# Patient Record
Sex: Male | Born: 1955 | Race: White | Hispanic: No | Marital: Married | Smoking: Never smoker
Health system: Southern US, Community
[De-identification: ages and names within clinical notes are randomized; demographics above are authoritative.]

## PROBLEM LIST (undated history)

## (undated) DIAGNOSIS — M1990 Unspecified osteoarthritis, unspecified site: Secondary | ICD-10-CM

## (undated) DIAGNOSIS — D649 Anemia, unspecified: Secondary | ICD-10-CM

## (undated) DIAGNOSIS — M109 Gout, unspecified: Secondary | ICD-10-CM

## (undated) DIAGNOSIS — E785 Hyperlipidemia, unspecified: Secondary | ICD-10-CM

## (undated) DIAGNOSIS — Z87442 Personal history of urinary calculi: Secondary | ICD-10-CM

## (undated) DIAGNOSIS — E119 Type 2 diabetes mellitus without complications: Secondary | ICD-10-CM

## (undated) DIAGNOSIS — I10 Essential (primary) hypertension: Secondary | ICD-10-CM

## (undated) DIAGNOSIS — K219 Gastro-esophageal reflux disease without esophagitis: Secondary | ICD-10-CM

## (undated) HISTORY — DX: Gout, unspecified: M10.9

## (undated) HISTORY — PX: COLONOSCOPY: SHX174

## (undated) HISTORY — PX: JOINT REPLACEMENT: SHX530

## (undated) HISTORY — PX: LEG SURGERY: SHX1003

## (undated) HISTORY — DX: Hyperlipidemia, unspecified: E78.5

## (undated) HISTORY — PX: NO PAST SURGERIES: SHX2092

---

## 2001-02-21 ENCOUNTER — Other Ambulatory Visit: Admission: RE | Admit: 2001-02-21 | Discharge: 2001-02-21 | Payer: Self-pay | Admitting: Gastroenterology

## 2006-02-10 ENCOUNTER — Ambulatory Visit: Payer: Self-pay | Admitting: Gastroenterology

## 2006-02-27 ENCOUNTER — Ambulatory Visit: Payer: Self-pay | Admitting: Gastroenterology

## 2013-09-16 ENCOUNTER — Other Ambulatory Visit: Payer: Self-pay

## 2013-09-16 ENCOUNTER — Telehealth: Payer: Self-pay

## 2013-09-16 DIAGNOSIS — Z1211 Encounter for screening for malignant neoplasm of colon: Secondary | ICD-10-CM

## 2013-09-16 NOTE — Telephone Encounter (Signed)
Gastroenterology Pre-Procedure Review  Request Date: 09/16/2013 Requesting Physician: Collene Mares  [place], PA-C   PT SAID HE HAD PREVIOUS COLONOSCOPY DONE AT [place]/ MOM HAD COLON CANCER AT AGE 55 AND HE THINKS HE MIGHT HAVE HAD POLYPS ONCE  HE WAS SUPPOSED TO HAVE COLONOSCOPY EVERY 5 YEARS AND HE SAID HE IS OVERDUE  PATIENT REVIEW QUESTIONS: The patient responded to the following health history questions as indicated:    1. Diabetes Melitis: YES 2. Joint replacements in the past 12 months: no 3. Major health problems in the past 3 months: no 4. Has an artificial valve or MVP: no 5. Has a defibrillator: no 6. Has been advised in past to take antibiotics in advance of a procedure like teeth cleaning: no    MEDICATIONS & ALLERGIES:    Patient reports the following regarding taking any blood thinners:   Plavix? no Aspirin? YES Coumadin? no  Patient confirms/reports the following medications:  Current Outpatient Prescriptions  Medication Sig Dispense Refill  . allopurinol (ZYLOPRIM) 300 MG tablet Take 300 mg by mouth daily.      Marland Kitchen aspirin 81 MG tablet Take 81 mg by mouth daily.      Marland Kitchen atenolol (TENORMIN) 100 MG tablet Take 100 mg by mouth 2 (two) times daily.      . ferrous sulfate 325 (65 FE) MG tablet Take 325 mg by mouth 2 (two) times daily with a meal.      . glipiZIDE-metformin (METAGLIP) 5-500 MG per tablet Take 2 tablets by mouth 2 (two) times daily before a meal.      . lisinopril (PRINIVIL,ZESTRIL) 20 MG tablet Take 20 mg by mouth daily.      . meloxicam (MOBIC) 15 MG tablet Take 15 mg by mouth daily.      . Multiple Vitamin (MULTIVITAMIN) tablet Take 1 tablet by mouth daily.      . niacin (NIASPAN) 1000 MG CR tablet Take 1,000 mg by mouth at bedtime.      . Omega-3 Fatty Acids (FISH OIL) 1000 MG CAPS Take 1,000 mg by mouth. Takes 2 tablets twice a day      . omeprazole (PRILOSEC) 20 MG capsule Take 20 mg by mouth daily.      . pravastatin (PRAVACHOL) 40 MG tablet Take 40 mg  by mouth daily.       No current facility-administered medications for this visit.    Patient confirms/reports the following allergies:  No Known Allergies  No orders of the defined types were placed in this encounter.    AUTHORIZATION INFORMATION Primary Insurance:   ID #:   Group #:  Pre-Cert / Auth required: Pre-Cert / Auth #:   Secondary Insurance:   ID #:   Group #:  Pre-Cert / Auth required: Pre-Cert / Auth #:   SCHEDULE INFORMATION: Procedure has been scheduled as follows:  Date: 10/07/2013                  Time:8:30 AM   Location: [facility] Short Stay  This Gastroenterology Pre-Precedure Review Form is being routed to the following provider(s): R. Garfield Cornea, MD

## 2013-09-17 NOTE — Telephone Encounter (Signed)
I called and left Vm for pt to find out when last colonoscopy was and to let me know right away.

## 2013-09-17 NOTE — Telephone Encounter (Signed)
When was his last colonoscopy exactly? We need to find out before scheduling.

## 2013-09-19 NOTE — Telephone Encounter (Signed)
Pt came by yesterday afternoon and signed release for [place] to get previous colonoscopy report from [place].

## 2013-09-25 ENCOUNTER — Encounter (HOSPITAL_COMMUNITY): Payer: Self-pay | Admitting: Pharmacy Technician

## 2013-09-25 NOTE — Telephone Encounter (Signed)
Bryan Atkinson, the reports are here from [place]. Dr. Lucio Edward did his last colonoscopy in 02/2006 and next was recommended for 02/2011.  His mother had colon cancer at 55 years old.  ( See report on your desk).

## 2013-09-25 NOTE — Telephone Encounter (Signed)
Per Laban Emperor, NP  Ok to proceed with the colonoscopy.

## 2013-09-26 NOTE — Telephone Encounter (Signed)
Bryan Atkinson, any diabetic meds change for day of prep

## 2013-09-30 MED ORDER — PEG-KCL-NACL-NASULF-NA ASC-C 100 G PO SOLR: 1.0000 | ORAL | Status: DC

## 2013-09-30 NOTE — Telephone Encounter (Signed)
Rx sent to pharmacy and instructions mailed to pt.

## 2013-09-30 NOTE — Telephone Encounter (Signed)
No diabetes medication day of procedure.

## 2013-10-07 ENCOUNTER — Encounter (HOSPITAL_COMMUNITY)
Admission: RE | Disposition: A | Discharge status: Home / Self Care | Payer: Self-pay | Source: Ambulatory Visit | Attending: Internal Medicine

## 2013-10-07 ENCOUNTER — Ambulatory Visit (HOSPITAL_COMMUNITY)
Admission: RE | Admit: 2013-10-07 | Discharge: 2013-10-07 | Disposition: A | Discharge status: Home / Self Care | Payer: Managed Care, Other (non HMO) | Source: Ambulatory Visit | Attending: Internal Medicine | Admitting: Internal Medicine

## 2013-10-07 ENCOUNTER — Encounter (HOSPITAL_COMMUNITY): Payer: Self-pay

## 2013-10-07 DIAGNOSIS — Z1211 Encounter for screening for malignant neoplasm of colon: Secondary | ICD-10-CM | POA: Insufficient documentation

## 2013-10-07 DIAGNOSIS — E119 Type 2 diabetes mellitus without complications: Secondary | ICD-10-CM | POA: Insufficient documentation

## 2013-10-07 DIAGNOSIS — Z79899 Other long term (current) drug therapy: Secondary | ICD-10-CM | POA: Insufficient documentation

## 2013-10-07 DIAGNOSIS — Z7982 Long term (current) use of aspirin: Secondary | ICD-10-CM | POA: Insufficient documentation

## 2013-10-07 DIAGNOSIS — I10 Essential (primary) hypertension: Secondary | ICD-10-CM | POA: Insufficient documentation

## 2013-10-07 DIAGNOSIS — Z800 Family history of malignant neoplasm of digestive organs: Secondary | ICD-10-CM

## 2013-10-07 DIAGNOSIS — Z01812 Encounter for preprocedural laboratory examination: Secondary | ICD-10-CM | POA: Insufficient documentation

## 2013-10-07 HISTORY — PX: COLONOSCOPY: SHX5424

## 2013-10-07 HISTORY — DX: Type 2 diabetes mellitus without complications: E11.9

## 2013-10-07 HISTORY — DX: Essential (primary) hypertension: I10

## 2013-10-07 HISTORY — DX: Unspecified osteoarthritis, unspecified site: M19.90

## 2013-10-07 LAB — GLUCOSE, CAPILLARY: Glucose-Capillary: 112 mg/dL — ABNORMAL HIGH (ref 70–99)

## 2013-10-07 SURGERY — COLONOSCOPY
Anesthesia: Moderate Sedation

## 2013-10-07 MED ORDER — MEPERIDINE HCL 100 MG/ML IJ SOLN
INTRAMUSCULAR | Status: AC
  Filled 2013-10-07: qty 2

## 2013-10-07 MED ORDER — SODIUM CHLORIDE 0.9 % IV SOLN
INTRAVENOUS | Status: DC
Start: 2013-10-07 — End: 2013-10-07
  Administered 2013-10-07: 08:00:00 via INTRAVENOUS

## 2013-10-07 MED ORDER — MIDAZOLAM HCL 5 MG/5ML IJ SOLN
INTRAMUSCULAR | Status: AC
  Filled 2013-10-07: qty 10

## 2013-10-07 MED ORDER — ONDANSETRON HCL 4 MG/2ML IJ SOLN
INTRAMUSCULAR | Status: DC | PRN
  Administered 2013-10-07: 4 mg via INTRAVENOUS

## 2013-10-07 MED ORDER — STERILE WATER FOR IRRIGATION IR SOLN
Status: DC | PRN
  Administered 2013-10-07: 08:00:00

## 2013-10-07 MED ORDER — MIDAZOLAM HCL 5 MG/5ML IJ SOLN
INTRAMUSCULAR | Status: DC | PRN
  Administered 2013-10-07 (×2): 2 mg via INTRAVENOUS
  Administered 2013-10-07: 1 mg via INTRAVENOUS

## 2013-10-07 MED ORDER — ONDANSETRON HCL 4 MG/2ML IJ SOLN
INTRAMUSCULAR | Status: AC
  Filled 2013-10-07: qty 2

## 2013-10-07 MED ORDER — MEPERIDINE HCL 100 MG/ML IJ SOLN
INTRAMUSCULAR | Status: DC | PRN
  Administered 2013-10-07 (×2): 50 mg via INTRAVENOUS

## 2013-10-07 NOTE — H&P (Signed)
Primary Care Physician:  Imelda Pillow, NP Primary Gastroenterologist:  Dr. Gala Romney  Pre-Procedure History & Physical: HPI:  Bryan Atkinson is a 57 y.o. male is here for a screening colonoscopy.  May have had polyps at last colonoscopy 2007-Dr. Marliss Czar. Mom succumbed colorectal cancer and a 40. No bowel symptoms currently.  Past Medical History  Diagnosis Date  . Hypertension   . Diabetes mellitus without complication   . Arthritis     Past Surgical History  Procedure Laterality Date  . No past surgeries    . Leg surgery Right early 80's  . Colonoscopy      times two 2002 ,2007    Prior to Admission medications   Medication Sig Start Date End Date Taking? Authorizing Provider  allopurinol (ZYLOPRIM) 300 MG tablet Take 300 mg by mouth daily.   Yes Historical Provider, MD  aspirin 81 MG tablet Take 81 mg by mouth daily.   Yes Historical Provider, MD  atenolol (TENORMIN) 100 MG tablet Take 100 mg by mouth 2 (two) times daily.   Yes Historical Provider, MD  ferrous sulfate 325 (65 FE) MG tablet Take 325 mg by mouth 2 (two) times daily with a meal.   Yes Historical Provider, MD  glipiZIDE-metformin (METAGLIP) 5-500 MG per tablet Take 2 tablets by mouth 2 (two) times daily before a meal.   Yes Historical Provider, MD  lisinopril (PRINIVIL,ZESTRIL) 20 MG tablet Take 20 mg by mouth daily.   Yes Historical Provider, MD  meloxicam (MOBIC) 15 MG tablet Take 15 mg by mouth daily.   Yes Historical Provider, MD  Multiple Vitamin (MULTIVITAMIN) tablet Take 1 tablet by mouth daily.   Yes Historical Provider, MD  Omega-3 Fatty Acids (FISH OIL) 1000 MG CAPS Take 2,000 mg by mouth 2 (two) times daily.    Yes Historical Provider, MD  omeprazole (PRILOSEC) 20 MG capsule Take 20 mg by mouth daily.   Yes Historical Provider, MD  peg 3350 powder (MOVIPREP) 100 G SOLR Take 1 kit (200 g total) by mouth as directed. 09/30/13  Yes Daneil Dolin, MD  pravastatin (PRAVACHOL) 40 MG tablet Take 40  mg by mouth 2 (two) times daily.    Yes Historical Provider, MD    Allergies as of 09/16/2013  . (No Known Allergies)    Family History  Problem Relation Age of Onset  . Colon cancer Mother     History   Social History  . Marital Status: Married    Spouse Name: N/A    Number of Children: N/A  . Years of Education: N/A   Occupational History  . Not on file.   Social History Main Topics  . Smoking status: Never Smoker   . Smokeless tobacco: Not on file  . Alcohol Use: No  . Drug Use: No  . Sexual Activity: Not on file   Other Topics Concern  . Not on file   Social History Narrative  . No narrative on file    Review of Systems: See HPI, otherwise negative ROS  Physical Exam: BP 167/94  Pulse 71  Temp(Src) 98.2 F (36.8 C) (Oral)  Resp 22  Ht 5\' 9"  (1.753 m)  Wt 270 lb (122.471 kg)  BMI 39.85 kg/m2  SpO2 98% General:   Alert,  Well-developed, well-nourished, pleasant and cooperative in NAD Head:  Normocephalic and atraumatic. Eyes:  Sclera clear, no icterus.   Conjunctiva pink. Ears:  Normal auditory acuity. Nose:  No deformity, discharge,  or lesions. Mouth:  No deformity or lesions, dentition normal. Neck:  Supple; no masses or thyromegaly. Lungs:  Clear throughout to auscultation.   No wheezes, crackles, or rhonchi. No acute distress. Heart:  Regular rate and rhythm; no murmurs, clicks, rubs,  or gallops. Abdomen:  Obese. Soft, nontender and nondistended. No masses, hepatosplenomegaly or hernias noted. Normal bowel sounds, without guarding, and without rebound.   Msk:  Symmetrical without gross deformities. Normal posture. Pulses:  Normal pulses noted. Extremities:  Without clubbing or edema. Neurologic:  Alert and  oriented x4;  grossly normal neurologically. Skin:  Intact without significant lesions or rashes. Cervical Nodes:  No significant cervical adenopathy. Psych:  Alert and cooperative. Normal mood and affect.  Impression/Plan: Bryan Atkinson is now here to undergo a screening colonoscopy.  History of polyps. High risk examination.  Risks, benefits, limitations, imponderables and alternatives regarding colonoscopy have been reviewed with the patient. Questions have been answered. All parties agreeable.

## 2013-10-07 NOTE — Op Note (Signed)
[facility] [address] [place], [address]   COLONOSCOPY PROCEDURE REPORT  PATIENT: Bryan Atkinson, Bryan Atkinson  MR#:         [id number] BIRTHDATE: 1956/07/07 , 89  yrs. old GENDER: Male ENDOSCOPIST: R.  Garfield Cornea, MD [facility] REFERRED BY:      [facility] PROCEDURE DATE:  10/07/2013 PROCEDURE:     Screening ileocolonoscopy  INDICATIONS: High risk colorectal cancer screening; personal history of colonic polyps reportedly and positive family history of colon cancer.  INFORMED CONSENT:  The risks, benefits, alternatives and imponderables including but not limited to bleeding, perforation as well as the possibility of a missed lesion have been reviewed.  The potential for biopsy, lesion removal, etc. have also been discussed.  Questions have been answered.  All parties agreeable. Please see the history and physical in the medical record for more information.  MEDICATIONS: Versed 5 mg IV and Demerol 100 mg IV in divided doses.. Zofran 4 mg IV  DESCRIPTION OF PROCEDURE:  After a digital rectal exam was performed, the EC-[id number])  colonoscope was advanced from the anus through the rectum and colon to the area of the cecum, ileocecal valve and appendiceal orifice.  The cecum was deeply intubated.  These structures were well-seen and photographed for the record.  From the level of the cecum and ileocecal valve, the scope was slowly and cautiously withdrawn.  The mucosal surfaces were carefully surveyed utilizing scope tip deflection to facilitate fold flattening as needed.  The scope was pulled down into the rectum where a thorough examination including retroflexion was performed.    FINDINGS:  Adequate Preparation. Normal rectum.  Normal-appearing colonic mucosa.  Normal-appearing distal 5 cm of terminal ileal mucosa as well.  THERAPEUTIC / DIAGNOSTIC MANEUVERS PERFORMED:  None  COMPLICATIONS: None  CECAL WITHDRAWAL TIME:  9  minutes  IMPRESSION:  Normal ileocolonoscopy  RECOMMENDATIONS: Repeat colonoscopy in 5 years   _______________________________ eSigned:  R. Garfield Cornea, MD FACP [facility] 10/07/2013 9:07 AM   CC:

## 2013-10-14 ENCOUNTER — Encounter (HOSPITAL_COMMUNITY): Payer: Self-pay | Admitting: Internal Medicine

## 2013-11-07 ENCOUNTER — Encounter: Payer: Self-pay | Admitting: Gastroenterology

## 2014-11-13 ENCOUNTER — Encounter (HOSPITAL_COMMUNITY): Payer: Self-pay | Admitting: Hematology & Oncology

## 2014-11-13 ENCOUNTER — Encounter (HOSPITAL_COMMUNITY): Payer: Managed Care, Other (non HMO) | Attending: Hematology & Oncology | Admitting: Hematology & Oncology

## 2014-11-13 VITALS — BP 149/96 | HR 74 | Temp 98.8°F | Resp 18 | Ht 69.0 in | Wt 237.3 lb

## 2014-11-13 DIAGNOSIS — Z86010 Personal history of colonic polyps: Secondary | ICD-10-CM | POA: Diagnosis not present

## 2014-11-13 DIAGNOSIS — D649 Anemia, unspecified: Secondary | ICD-10-CM | POA: Diagnosis not present

## 2014-11-13 DIAGNOSIS — E118 Type 2 diabetes mellitus with unspecified complications: Secondary | ICD-10-CM | POA: Insufficient documentation

## 2014-11-13 LAB — CBC WITH DIFFERENTIAL/PLATELET
BASOS PCT: 1 % (ref 0–1)
Basophils Absolute: 0.1 10*3/uL (ref 0.0–0.1)
Eosinophils Absolute: 0.2 10*3/uL (ref 0.0–0.7)
Eosinophils Relative: 2 % (ref 0–5)
HCT: 37.7 % — ABNORMAL LOW (ref 39.0–52.0)
Hemoglobin: 12.2 g/dL — ABNORMAL LOW (ref 13.0–17.0)
Lymphocytes Relative: 38 % (ref 12–46)
Lymphs Abs: 3.7 10*3/uL (ref 0.7–4.0)
MCH: 29.6 pg (ref 26.0–34.0)
MCHC: 32.4 g/dL (ref 30.0–36.0)
MCV: 91.5 fL (ref 78.0–100.0)
Monocytes Absolute: 0.8 10*3/uL (ref 0.1–1.0)
Monocytes Relative: 8 % (ref 3–12)
Neutro Abs: 4.9 10*3/uL (ref 1.7–7.7)
Neutrophils Relative %: 51 % (ref 43–77)
Platelets: 332 10*3/uL (ref 150–400)
RBC: 4.12 MIL/uL — AB (ref 4.22–5.81)
RDW: 12.8 % (ref 11.5–15.5)
WBC: 9.7 10*3/uL (ref 4.0–10.5)

## 2014-11-13 LAB — COMPREHENSIVE METABOLIC PANEL
ALBUMIN: 3.9 g/dL (ref 3.5–5.2)
ALK PHOS: 51 U/L (ref 39–117)
ALT: 22 U/L (ref 0–53)
AST: 21 U/L (ref 0–37)
Anion gap: 9 (ref 5–15)
BUN: 23 mg/dL (ref 6–23)
CALCIUM: 9.2 mg/dL (ref 8.4–10.5)
CO2: 26 mmol/L (ref 19–32)
Chloride: 103 mmol/L (ref 96–112)
Creatinine, Ser: 1.48 mg/dL — ABNORMAL HIGH (ref 0.50–1.35)
GFR calc Af Amer: 59 mL/min — ABNORMAL LOW (ref >90)
GFR, EST NON AFRICAN AMERICAN: 51 mL/min — AB (ref >90)
GLUCOSE: 115 mg/dL — AB (ref 70–99)
POTASSIUM: 3.4 mmol/L — AB (ref 3.5–5.1)
Sodium: 138 mmol/L (ref 135–145)
Total Bilirubin: 0.4 mg/dL (ref 0.3–1.2)
Total Protein: 7.4 g/dL (ref 6.0–8.3)

## 2014-11-13 LAB — IRON AND TIBC
Iron: 43 ug/dL (ref 42–165)
SATURATION RATIOS: 16 % — AB (ref 20–55)
TIBC: 268 ug/dL (ref 215–435)
UIBC: 225 ug/dL (ref 125–400)

## 2014-11-13 LAB — SEDIMENTATION RATE: Sed Rate: 26 mm/hr — ABNORMAL HIGH (ref 0–16)

## 2014-11-13 LAB — LACTATE DEHYDROGENASE: LDH: 126 U/L (ref 94–250)

## 2014-11-13 LAB — FERRITIN: Ferritin: 816 ng/mL — ABNORMAL HIGH (ref 22–322)

## 2014-11-13 LAB — C-REACTIVE PROTEIN: CRP: 0.7 mg/dL — AB (ref <0.60)

## 2014-11-13 LAB — RETICULOCYTES
RBC.: 4.12 MIL/uL — ABNORMAL LOW (ref 4.22–5.81)
Retic Count, Absolute: 61.8 10*3/uL (ref 19.0–186.0)
Retic Ct Pct: 1.5 % (ref 0.4–3.1)

## 2014-11-13 LAB — TSH: TSH: 3.208 u[IU]/mL (ref 0.350–4.500)

## 2014-11-13 LAB — FOLATE

## 2014-11-13 LAB — VITAMIN B12: Vitamin B-12: 894 pg/mL (ref 211–911)

## 2014-11-13 NOTE — Progress Notes (Signed)
Bryan Atkinson's reason for visit today is for labs as scheduled per MD orders.  Venipuncture performed with a 23 gauge butterfly needle to R Antecubital.  Bryan Atkinson tolerated procedure well and without incident; questions were answered and patient was discharged.

## 2014-11-13 NOTE — Patient Instructions (Signed)
[place] at [facility]  Discharge Instructions: We have seen you today for anemia I will see you back in 2 to 3 weeks to review your blood work If you have questions or problems prior to follow-up please call _______________________________________________________________  Thank you for choosing [place] at [facility] to provide your oncology and hematology care.  To afford each patient quality time with our providers, please arrive at least 15 minutes before your scheduled appointment.  You need to re-schedule your appointment if you arrive 10 or more minutes late.  We strive to give you quality time with our providers, and arriving late affects you and other patients whose appointments are after yours.  Also, if you no show three or more times for appointments you may be dismissed from the clinic.  Again, thank you for choosing [facility] [place] hope is that these requests will allow you access to exceptional care and in a timely manner. _______________________________________________________________  If you have questions after your visit, please contact our office at [phone] between the hours of 8:30 a.m. and 5:00 p.m. Voicemails left after 4:30 p.m. will not be returned until the following business day. _______________________________________________________________  For prescription refill requests, have your pharmacy contact our office. _______________________________________________________________  Recommendations made by the consultant and any test results will be sent to your referring physician. _______________________________________________________________

## 2014-11-13 NOTE — Progress Notes (Signed)
[place] NOTE  Patient Care Team: Everardo Beals, NP as PCP - General  CHIEF COMPLAINTS/PURPOSE OF CONSULTATION:  Anemia Colonoscopy in 09/2013 with Dr. Sydell Axon, normal. History of colon polyps.  HISTORY OF PRESENTING ILLNESS:  Bryan Atkinson 59 y.o. male is here because of anemia.  He reports being started on iron pills several years ago for iron deficiency anemia.  He used to donate blood and states he went through a period where he could not donate because of anemia.   He is a diabetic but states since he has lost weight his sugars are improved.  He has lost about 35 lbs and has been trying.  He works daily. He does complain of fatigue at night.    MEDICAL HISTORY:  Past Medical History  Diagnosis Date  . Hypertension   . Diabetes mellitus without complication   . Arthritis   . Gout     SURGICAL HISTORY: Past Surgical History  Procedure Laterality Date  . No past surgeries    . Leg surgery Right early 80's  . Colonoscopy      times two 2002 ,2007  . Colonoscopy N/A 10/07/2013    Procedure: COLONOSCOPY;  Surgeon: Daneil Dolin, MD;  Location: [facility];  Service: Endoscopy;  Laterality: N/A;  8:30 AM    SOCIAL HISTORY: History   Social History  . Marital Status: Married    Spouse Name: N/A    Number of Children: N/A  . Years of Education: N/A   Occupational History  . Not on file.   Social History Main Topics  . Smoking status: Never Smoker   . Smokeless tobacco: Not on file  . Alcohol Use: No  . Drug Use: No  . Sexual Activity: Not on file   Other Topics Concern  . Not on file   Social History Narrative    FAMILY HISTORY: Family History  Problem Relation Age of Onset  . Colon cancer Mother    indicated that his mother is deceased.   ALLERGIES:  has No Known Allergies.  MEDICATIONS:  Current Outpatient Prescriptions  Medication Sig Dispense Refill  . allopurinol (ZYLOPRIM) 300 MG tablet Take 300 mg by mouth  daily.    Marland Kitchen aspirin 81 MG tablet Take 81 mg by mouth daily.    Marland Kitchen atenolol (TENORMIN) 100 MG tablet Take 100 mg by mouth 2 (two) times daily.    . fenofibrate (TRICOR) 145 MG tablet Take 145 mg by mouth daily.    . ferrous sulfate 325 (65 FE) MG tablet Take 325 mg by mouth 2 (two) times daily with a meal.    . glucose blood test strip 1 each by Other route as needed for other. Use as instructed    . guanFACINE (TENEX) 2 MG tablet Take 2 mg by mouth at bedtime.    Marland Kitchen lisinopril-hydrochlorothiazide (PRINZIDE,ZESTORETIC) 20-25 MG per tablet Take 1 tablet by mouth daily.    . meloxicam (MOBIC) 15 MG tablet Take 15 mg by mouth daily.    . Multiple Vitamin (MULTIVITAMIN) tablet Take 1 tablet by mouth daily.    . niacin (NIASPAN) 1000 MG CR tablet Take 1,000 mg by mouth at bedtime.    . Omega-3 Fatty Acids (FISH OIL) 1000 MG CAPS Take 2,000 mg by mouth 2 (two) times daily.     Marland Kitchen omeprazole (PRILOSEC) 20 MG capsule Take 20 mg by mouth daily.    . pravastatin (PRAVACHOL) 40 MG tablet Take 40 mg by mouth  2 (two) times daily.     . sitaGLIPtin-metformin (JANUMET) 50-1000 MG per tablet Take 1 tablet by mouth 2 (two) times daily with a meal.     No current facility-administered medications for this visit.    Review of Systems  Constitutional: Positive for malaise/fatigue.       Weight loss from 270 down to 237. Was trying  HENT: Positive for congestion.   Eyes:       Contacts  Respiratory: Negative.   Cardiovascular: Negative.   Gastrointestinal: Positive for constipation. Negative for heartburn, nausea, vomiting, abdominal pain, diarrhea, blood in stool and melena.  Genitourinary: Negative.   Musculoskeletal: Negative.   Neurological: Negative.   Endo/Heme/Allergies: Negative.   Psychiatric/Behavioral: Negative.     PHYSICAL EXAMINATION:  ECOG PERFORMANCE STATUS: 0 - Asymptomatic  Filed Vitals:   11/13/14 1456  BP: 149/96  Pulse: 74  Temp: 98.8 F (37.1 C)  Resp: 18   Filed Weights     11/13/14 1456  Weight: 237 lb 4.8 oz (107.639 kg)     Physical Exam  Constitutional: He is oriented to person, place, and time and well-developed, well-nourished, and in no distress.  HENT:  Head: Normocephalic and atraumatic.  Nose: Nose normal.  Mouth/Throat: Oropharynx is clear and moist. No oropharyngeal exudate.  Eyes: Conjunctivae and EOM are normal. Pupils are equal, round, and reactive to light. Right eye exhibits no discharge. Left eye exhibits no discharge. No scleral icterus.  Neck: Normal range of motion. Neck supple. No tracheal deviation present. No thyromegaly present.  Cardiovascular: Normal rate, regular rhythm and normal heart sounds.  Exam reveals no gallop and no friction rub.   No murmur heard. Pulmonary/Chest: Effort normal and breath sounds normal. He has no wheezes. He has no rales.  Abdominal: Soft. Bowel sounds are normal. He exhibits no distension and no mass. There is no tenderness. There is no rebound and no guarding.  Musculoskeletal: Normal range of motion. He exhibits no edema.  Lymphadenopathy:    He has no cervical adenopathy.  Neurological: He is alert and oriented to person, place, and time. He has normal reflexes. No cranial nerve deficit. Gait normal. Coordination normal.  Skin: Skin is warm and dry. No rash noted.  Psychiatric: Mood, memory, affect and judgment normal.  Nursing note and vitals reviewed.    LABORATORY DATA:  I have reviewed the data as listed No results found for: WBC, HGB, HCT, MCV, PLT   Chemistry   No results found for: NA, K, CL, CO2, BUN, CREATININE, GLU No results found for: CALCIUM, ALKPHOS, AST, ALT, BILITOT     RADIOGRAPHIC STUDIES: I have personally reviewed the radiological images as listed and agreed with the findings in the report. No results found.  ASSESSMENT & PLAN:  No problem-specific assessment & plan notes found for this encounter.  No orders of the defined types were placed in this encounter.     All questions were answered. The patient knows to call the clinic with any problems, questions or concerns.     Molli Hazard, MD MD 11/13/2014 3:04 PM

## 2014-11-14 LAB — HAPTOGLOBIN: HAPTOGLOBIN: 249 mg/dL — AB (ref 34–200)

## 2014-11-14 LAB — IGG, IGA, IGM
IGA: 137 mg/dL (ref 68–379)
IgG (Immunoglobin G), Serum: 783 mg/dL (ref 650–1600)
IgM, Serum: 29 mg/dL — ABNORMAL LOW (ref 41–251)

## 2014-11-14 LAB — TESTOSTERONE: Testosterone: 237 ng/dL — ABNORMAL LOW (ref 300–890)

## 2014-11-14 LAB — KAPPA/LAMBDA LIGHT CHAINS
KAPPA FREE LGHT CHN: 29.97 mg/L — AB (ref 3.30–19.40)
Kappa, lambda light chain ratio: 1.14 (ref 0.26–1.65)
Lambda free light chains: 26.23 mg/L (ref 5.71–26.30)

## 2014-11-17 LAB — IMMUNOFIXATION ELECTROPHORESIS
IgA: 147 mg/dL (ref 68–379)
IgG (Immunoglobin G), Serum: 832 mg/dL (ref 650–1600)
IgM, Serum: 33 mg/dL — ABNORMAL LOW (ref 41–251)
TOTAL PROTEIN ELP: 6.8 g/dL (ref 6.0–8.3)

## 2014-11-17 LAB — PROTEIN ELECTROPHORESIS, SERUM
Albumin ELP: 56.6 % (ref 55.8–66.1)
Alpha-1-Globulin: 5.3 % — ABNORMAL HIGH (ref 2.9–4.9)
Alpha-2-Globulin: 13.8 % — ABNORMAL HIGH (ref 7.1–11.8)
BETA GLOBULIN: 6.8 % (ref 4.7–7.2)
Beta 2: 5.7 % (ref 3.2–6.5)
GAMMA GLOBULIN: 11.8 % (ref 11.1–18.8)
M-SPIKE, %: NOT DETECTED g/dL
TOTAL PROTEIN ELP: 6.8 g/dL (ref 6.0–8.3)

## 2014-12-03 ENCOUNTER — Encounter (HOSPITAL_COMMUNITY): Payer: Managed Care, Other (non HMO) | Attending: Hematology & Oncology | Admitting: Hematology & Oncology

## 2014-12-03 ENCOUNTER — Encounter (HOSPITAL_COMMUNITY): Payer: Self-pay | Admitting: Hematology & Oncology

## 2014-12-03 ENCOUNTER — Ambulatory Visit (HOSPITAL_COMMUNITY): Payer: Managed Care, Other (non HMO) | Admitting: Hematology & Oncology

## 2014-12-03 VITALS — BP 144/89 | HR 70 | Temp 98.7°F | Resp 18 | Wt 234.8 lb

## 2014-12-03 DIAGNOSIS — E119 Type 2 diabetes mellitus without complications: Secondary | ICD-10-CM

## 2014-12-03 DIAGNOSIS — N189 Chronic kidney disease, unspecified: Secondary | ICD-10-CM

## 2014-12-03 DIAGNOSIS — E118 Type 2 diabetes mellitus with unspecified complications: Secondary | ICD-10-CM | POA: Insufficient documentation

## 2014-12-03 DIAGNOSIS — Z86010 Personal history of colonic polyps: Secondary | ICD-10-CM | POA: Insufficient documentation

## 2014-12-03 DIAGNOSIS — D649 Anemia, unspecified: Secondary | ICD-10-CM | POA: Insufficient documentation

## 2014-12-03 NOTE — Patient Instructions (Signed)
[place] at [facility] Discharge Instructions  RECOMMENDATIONS MADE BY THE CONSULTANT AND ANY TEST RESULTS WILL BE SENT TO YOUR REFERRING PHYSICIAN.  Discussion by Dr. Whitney Muse If you want to stop the prilosec you can but it's not the cause of your abnormal labs.  Don't think we need to do a bone marrow biopsy at this time but will recheck you in 3 months. Report increased fatigue, shortness of breath, night sweats, unexplained weight loss, or other concerns.  Follow-up in 3 months with lab work and office visit.  Thank you for choosing [place] at [facility] to provide your oncology and hematology care.  To afford each patient quality time with our provider, please arrive at least 15 minutes before your scheduled appointment time.    You need to re-schedule your appointment should you arrive 10 or more minutes late.  We strive to give you quality time with our providers, and arriving late affects you and other patients whose appointments are after yours.  Also, if you no show three or more times for appointments you may be dismissed from the clinic at the providers discretion.     Again, thank you for choosing [facility].  Our hope is that these requests will decrease the amount of time that you wait before being seen by our physicians.       _____________________________________________________________  Should you have questions after your visit to [facility], please contact our office at [phone] between the hours of 8:30 a.m. and 4:30 p.m.  Voicemails left after 4:30 p.m. will not be returned until the following business day.  For prescription refill requests, have your pharmacy contact our office.

## 2014-12-03 NOTE — Progress Notes (Signed)
[facility] NOTE  Patient Care Team: Everardo Beals, NP as PCP - General  CHIEF COMPLAINTS/PURPOSE OF CONSULTATION:  Anemia Colonoscopy in 09/2013 with Dr. Sydell Axon, normal. History of colon polyps. CKD Mild elevation in CRP Iron studies c/w anemia of chronic disease, elevated haptoglobin Hypogonadism with serum testosterone of 237 ng/dl (Patient was taking oral iron and advised to discontinue on 12/03/2014)  HISTORY OF PRESENTING ILLNESS:  Bryan Atkinson 59 y.o. male is here because of anemia.  He reports being started on iron pills several years ago for iron deficiency anemia.  He used to donate blood and states he went through a period where he could not donate because of anemia.   He is a diabetic but states since he has lost weight his sugars are improved.  He has lost about 35 lbs and has been trying.  He works daily. He does complain of fatigue at night.   He is here today with his wife to go over laboratory studies obtained at his last visit. He had peripheral labs only and not a bone marrow biopsy.   MEDICAL HISTORY:  Past Medical History  Diagnosis Date  . Hypertension   . Diabetes mellitus without complication   . Arthritis   . Gout     SURGICAL HISTORY: Past Surgical History  Procedure Laterality Date  . No past surgeries    . Leg surgery Right early 80's  . Colonoscopy      times two 2002 ,2007  . Colonoscopy N/A 10/07/2013    Procedure: COLONOSCOPY;  Surgeon: Daneil Dolin, MD;  [facility]: [facility];  Service: Endoscopy;  Laterality: N/A;  8:30 AM    SOCIAL HISTORY: History   Social History  . Marital Status: Married    Spouse Name: N/A  . Number of Children: N/A  . Years of Education: N/A   Occupational History  . 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 Smoking status: Never Smoker   . Smokeless tobacco: Never Used  . Alcohol Use: No  . Drug Use: No  .  Sexual Activity: Not on file   Other Topics Concern  . Not on file   Social History Narrative   Daughter is 75, Son is 60.  3 grandchildren. Married for 65 years    FAMILY HISTORY: Family History  Problem Relation Age of Onset  . Colon cancer Mother     She died from her malignancy, 27  . Lung cancer Father     Died from lung cancer, age 34  . Emphysema Brother     Died at 68 from lung disease   indicated that his mother is deceased.   ALLERGIES:  has No Known Allergies.  MEDICATIONS:  Current Outpatient Prescriptions  Medication Sig Dispense Refill  . allopurinol (ZYLOPRIM) 300 MG tablet Take 300 mg by mouth daily.    Marland Kitchen aspirin 81 MG tablet Take 81 mg by mouth daily.    Marland Kitchen atenolol (TENORMIN) 100 MG tablet Take 100 mg by mouth 2 (two) times daily.    . Canagliflozin-Metformin HCl 50-1000 MG TABS Take 1 tablet by mouth 2 (two) times daily.    Marland Kitchen Dextromethorphan-Guaifenesin (ROBITUSSIN DM PO) Take 10 mLs by mouth as needed.    . fenofibrate (TRICOR) 145 MG tablet Take 145 mg by mouth daily.    . ferrous sulfate 325 (65 FE) MG tablet Take 325 mg  by mouth 2 (two) times daily with a meal.    . glucose blood test strip 1 each by Other route as needed for other. Use as instructed    . guanFACINE (TENEX) 2 MG tablet Take 2 mg by mouth at bedtime.    Marland Kitchen lisinopril-hydrochlorothiazide (PRINZIDE,ZESTORETIC) 20-25 MG per tablet Take 1 tablet by mouth daily.    . meloxicam (MOBIC) 15 MG tablet Take 15 mg by mouth daily.    . Multiple Vitamin (MULTIVITAMIN) tablet Take 1 tablet by mouth daily.    . niacin (NIASPAN) 1000 MG CR tablet Take 1,000 mg by mouth at bedtime.    . Omega-3 Fatty Acids (FISH OIL) 1000 MG CAPS Take 2,000 mg by mouth 2 (two) times daily.     Marland Kitchen omeprazole (PRILOSEC) 20 MG capsule Take 20 mg by mouth daily.    . pravastatin (PRAVACHOL) 40 MG tablet Take 40 mg by mouth 2 (two) times daily.      No current facility-administered medications for this visit.    Review of  Systems  Constitutional: Positive for malaise/fatigue. Negative for fever, chills and weight loss.  HENT: Negative for congestion, hearing loss, nosebleeds, sore throat and tinnitus.   Eyes: Negative for blurred vision, double vision, pain and discharge.  Respiratory: Negative for cough, hemoptysis, sputum production, shortness of breath and wheezing.   Cardiovascular: Negative for chest pain, palpitations, claudication, leg swelling and PND.  Gastrointestinal: Negative for heartburn, nausea, vomiting, abdominal pain, diarrhea, constipation, blood in stool and melena.  Genitourinary: Negative for dysuria, urgency, frequency and hematuria.  Musculoskeletal: Negative for myalgias, joint pain and falls.  Skin: Negative for itching and rash.  Neurological: Negative for dizziness, tingling, tremors, sensory change, speech change, focal weakness, seizures, loss of consciousness, weakness and headaches.  Endo/Heme/Allergies: Does not bruise/bleed easily.  Psychiatric/Behavioral: Negative for depression, suicidal ideas, memory loss and substance abuse. The patient is not nervous/anxious and does not have insomnia.     PHYSICAL EXAMINATION:  ECOG PERFORMANCE STATUS: 0 - Asymptomatic  Filed Vitals:   12/03/14 1503  BP: 144/89  Pulse: 70  Temp: 98.7 F (37.1 C)  Resp: 18   Filed Weights   12/03/14 1503  Weight: 234 lb 12.8 oz (106.505 kg)     Physical Exam  Constitutional: He is oriented to person, place, and time and well-developed, well-nourished, and in no distress.  HENT:  Head: Normocephalic and atraumatic.  Nose: Nose normal.  Mouth/Throat: Oropharynx is clear and moist. No oropharyngeal exudate.  Eyes: Conjunctivae and EOM are normal. Pupils are equal, round, and reactive to light. Right eye exhibits no discharge. Left eye exhibits no discharge. No scleral icterus.  Neck: Normal range of motion. Neck supple. No tracheal deviation present. No thyromegaly present.  Cardiovascular:  Normal rate, regular rhythm and normal heart sounds.  Exam reveals no gallop and no friction rub.   No murmur heard. Pulmonary/Chest: Effort normal and breath sounds normal. He has no wheezes. He has no rales.  Abdominal: Soft. Bowel sounds are normal. He exhibits no distension and no mass. There is no tenderness. There is no rebound and no guarding.  Musculoskeletal: Normal range of motion. He exhibits no edema.  Lymphadenopathy:    He has no cervical adenopathy.  Neurological: He is alert and oriented to person, place, and time. He has normal reflexes. No cranial nerve deficit. Gait normal. Coordination normal.  Skin: Skin is warm and dry. No rash noted.  Psychiatric: Mood, memory, affect and judgment normal.  Nursing note  and vitals reviewed.    LABORATORY DATA:  I have reviewed the data as listed Lab Results  Component Value Date   WBC 9.7 11/13/2014   HGB 12.2* 11/13/2014   HCT 37.7* 11/13/2014   MCV 91.5 11/13/2014   PLT 332 11/13/2014     Chemistry      Component Value Date/Time   NA 138 11/13/2014 1600   K 3.4* 11/13/2014 1600   CL 103 11/13/2014 1600   CO2 26 11/13/2014 1600   BUN 23 11/13/2014 1600   CREATININE 1.48* 11/13/2014 1600      Component Value Date/Time   CALCIUM 9.2 11/13/2014 1600   ALKPHOS 51 11/13/2014 1600   AST 21 11/13/2014 1600   ALT 22 11/13/2014 1600   BILITOT 0.4 11/13/2014 1600      ASSESSMENT & PLAN:   Anemia  59 year old male with mild anemia. Evaluation has revealed multiple contributing factors including chronic kidney disease, probable anemia of chronic disease, and hypogonadism. We discussed proceeding with a bone marrow biopsy but I advised him I currently feel comfortable with observation. His wife, the patient, and myself spent time discussing the benefits versus the risks of a bone marrow biopsy. We opted to proceed with observation at this time, I will see him back again in 3 months with repeat laboratory studies. He was  advised to currently discontinue his oral iron. We will recheck laboratory studies including iron levels at follow-up.  Orders Placed This Encounter  Procedures  . CBC with Differential    Standing Status: Future     Number of Occurrences:      Standing Expiration Date: 12/04/2015  . Ferritin    Standing Status: Future     Number of Occurrences:      Standing Expiration Date: 12/04/2015    All questions were answered. The patient knows to call the clinic with any problems, questions or concerns.     Molli Hazard, MD MD 12/29/2014 9:09 AM

## 2015-03-03 ENCOUNTER — Ambulatory Visit (HOSPITAL_COMMUNITY): Payer: Managed Care, Other (non HMO) | Admitting: Hematology & Oncology

## 2015-03-03 ENCOUNTER — Encounter (HOSPITAL_COMMUNITY): Payer: Self-pay | Admitting: Hematology & Oncology

## 2015-03-03 ENCOUNTER — Encounter (HOSPITAL_BASED_OUTPATIENT_CLINIC_OR_DEPARTMENT_OTHER): Payer: Managed Care, Other (non HMO) | Admitting: Hematology & Oncology

## 2015-03-03 ENCOUNTER — Encounter (HOSPITAL_COMMUNITY): Payer: Managed Care, Other (non HMO) | Attending: Hematology & Oncology

## 2015-03-03 VITALS — BP 129/77 | HR 63 | Temp 98.6°F | Resp 18 | Wt 227.6 lb

## 2015-03-03 DIAGNOSIS — D649 Anemia, unspecified: Secondary | ICD-10-CM

## 2015-03-03 DIAGNOSIS — E291 Testicular hypofunction: Secondary | ICD-10-CM | POA: Diagnosis not present

## 2015-03-03 DIAGNOSIS — N189 Chronic kidney disease, unspecified: Secondary | ICD-10-CM

## 2015-03-03 LAB — CBC WITH DIFFERENTIAL/PLATELET
BASOS ABS: 0 10*3/uL (ref 0.0–0.1)
Basophils Relative: 0 % (ref 0–1)
Eosinophils Absolute: 0.3 10*3/uL (ref 0.0–0.7)
Eosinophils Relative: 3 % (ref 0–5)
HCT: 38.5 % — ABNORMAL LOW (ref 39.0–52.0)
Hemoglobin: 12.8 g/dL — ABNORMAL LOW (ref 13.0–17.0)
Lymphocytes Relative: 29 % (ref 12–46)
Lymphs Abs: 2.9 10*3/uL (ref 0.7–4.0)
MCH: 30.5 pg (ref 26.0–34.0)
MCHC: 33.2 g/dL (ref 30.0–36.0)
MCV: 91.9 fL (ref 78.0–100.0)
Monocytes Absolute: 0.7 10*3/uL (ref 0.1–1.0)
Monocytes Relative: 7 % (ref 3–12)
NEUTROS ABS: 6.2 10*3/uL (ref 1.7–7.7)
Neutrophils Relative %: 61 % (ref 43–77)
PLATELETS: 270 10*3/uL (ref 150–400)
RBC: 4.19 MIL/uL — AB (ref 4.22–5.81)
RDW: 12.8 % (ref 11.5–15.5)
WBC: 10.1 10*3/uL (ref 4.0–10.5)

## 2015-03-03 LAB — FERRITIN: Ferritin: 328 ng/mL (ref 24–336)

## 2015-03-03 NOTE — Patient Instructions (Signed)
..  [place] at [facility] Discharge Instructions  RECOMMENDATIONS MADE BY THE CONSULTANT AND ANY TEST RESULTS WILL BE SENT TO YOUR REFERRING PHYSICIAN.  We will get your labs from [facility] urgent care  Return to see [place] in 4 months  Thank you for choosing [place] at [facility] to provide your oncology and hematology care.  To afford each patient quality time with our provider, please arrive at least 15 minutes before your scheduled appointment time.    You need to re-schedule your appointment should you arrive 10 or more minutes late.  We strive to give you quality time with our providers, and arriving late affects you and other patients whose appointments are after yours.  Also, if you no show three or more times for appointments you may be dismissed from the clinic at the providers discretion.     Again, thank you for choosing [facility].  Our hope is that these requests will decrease the amount of time that you wait before being seen by our physicians.       _____________________________________________________________  Should you have questions after your visit to [facility], please contact our office at [phone] between the hours of 8:30 a.m. and 4:30 p.m.  Voicemails left after 4:30 p.m. will not be returned until the following business day.  For prescription refill requests, have your pharmacy contact our office.

## 2015-03-03 NOTE — Progress Notes (Signed)
Bryan Atkinson  Patient Care Team: Everardo Beals, NP as PCP - [facility] / [facility]*  CHIEF COMPLAINTS/PURPOSE OF CONSULTATION:  Anemia Colonoscopy in 09/2013 with Dr. Sydell Axon, normal. History of colon polyps. CKD Mild elevation in CRP Iron studies c/w anemia of chronic disease, elevated haptoglobin Hypogonadism with serum testosterone of 237 ng/dl (Patient was taking oral iron and advised to discontinue on 12/03/2014)  HISTORY OF PRESENTING ILLNESS:  Bryan Atkinson 59 y.o. male is here because of anemia.  He reports being started on iron pills several years ago for iron deficiency anemia.  He used to donate blood and states he went through a period where he could not donate because of anemia.   He is here for ongoing observation. He has not had a BMBX.  He continues to have fatigue. He reports that he would like more energy. He worked 59 hours last week. He is sleeping well. His blood glucose level is typically 110-140 mg/dL in the morning.  MEDICAL HISTORY:  Past Medical History  Diagnosis Date  . Hypertension   . Diabetes mellitus without complication   . Arthritis   . Gout     SURGICAL HISTORY: Past Surgical History  Procedure Laterality Date  . No past surgeries    . Leg surgery Right early 80's  . Colonoscopy      times two 2002 ,2007  . Colonoscopy N/A 10/07/2013    Procedure: COLONOSCOPY;  Surgeon: Daneil Dolin, MD;  Location: [facility];  Service: Endoscopy;  Laterality: N/A;  8:30 AM    SOCIAL HISTORY: History   Social History  . Marital Status: Married    Spouse Name: N/A  . Number of Children: N/A  . Years of Education: N/A   Occupational History  . 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 Smoking status: Never Smoker   . Smokeless tobacco: Never Used  . Alcohol Use: No  . Drug Use: No  . Sexual Activity: Not  on file   Other Topics Concern  . Not on file   Social History Narrative   Daughter is 58, Son is 53.  3 grandchildren. Married for 24 years    FAMILY HISTORY: Family History  Problem Relation Age of Onset  . Colon cancer Mother     She died from her malignancy, 45  . Lung cancer Father     Died from lung cancer, age 42  . Emphysema Brother     Died at 39 from lung disease   indicated that his mother is deceased.   ALLERGIES:  has No Known Allergies.  MEDICATIONS:  Current Outpatient Prescriptions  Medication Sig Dispense Refill  . allopurinol (ZYLOPRIM) 300 MG tablet Take 300 mg by mouth daily.    Marland Kitchen aspirin 81 MG tablet Take 81 mg by mouth daily.    Marland Kitchen atenolol (TENORMIN) 100 MG tablet Take 100 mg by mouth 2 (two) times daily.    . Canagliflozin-Metformin HCl 50-1000 MG TABS Take 1 tablet by mouth 2 (two) times daily.    . fenofibrate (TRICOR) 145 MG tablet Take 145 mg by mouth daily.    Marland Kitchen glucose blood test strip 1 each by Other route as needed for other. Use as instructed    . guanFACINE (TENEX) 2 MG tablet Take 2 mg by mouth at bedtime.    Marland Kitchen  lisinopril-hydrochlorothiazide (PRINZIDE,ZESTORETIC) 20-25 MG per tablet Take 1 tablet by mouth daily.    . meloxicam (MOBIC) 15 MG tablet Take 15 mg by mouth daily.    . Multiple Vitamin (MULTIVITAMIN) tablet Take 1 tablet by mouth daily.    . niacin (NIASPAN) 1000 MG CR tablet Take 1,000 mg by mouth at bedtime.    . Omega-3 Fatty Acids (FISH OIL) 1000 MG CAPS Take 2,000 mg by mouth 2 (two) times daily.     . pravastatin (PRAVACHOL) 40 MG tablet Take 40 mg by mouth 2 (two) times daily.     Marland Kitchen Dextromethorphan-Guaifenesin (ROBITUSSIN DM PO) Take 10 mLs by mouth as needed.    . ferrous sulfate 325 (65 FE) MG tablet Take 325 mg by mouth 2 (two) times daily with a meal.    . omeprazole (PRILOSEC) 20 MG capsule Take 20 mg by mouth daily.     No current facility-administered medications for this visit.    Review of Systems    Constitutional: Negative for fever, chills and weight loss.  HENT: Negative for congestion, hearing loss, nosebleeds, sore throat and tinnitus.   Eyes: Negative for blurred vision, double vision, pain and discharge.  Respiratory: Negative for cough, hemoptysis, sputum production, shortness of breath and wheezing.   Cardiovascular: Negative for chest pain, palpitations, claudication, leg swelling and PND.  Gastrointestinal: Negative for heartburn, nausea, vomiting, abdominal pain, diarrhea, constipation, blood in stool and melena.  Genitourinary: Negative for dysuria, urgency, frequency and hematuria.  Musculoskeletal: Negative for myalgias, joint pain and falls.  Skin: Negative for itching and rash.  Neurological: Negative for dizziness, tingling, tremors, sensory change, speech change, focal weakness, seizures, loss of consciousness, weakness and headaches.  Endo/Heme/Allergies: Does not bruise/bleed easily.  Psychiatric/Behavioral: Negative for depression, suicidal ideas, memory loss and substance abuse. The patient is not nervous/anxious and does not have insomnia.     PHYSICAL EXAMINATION:  ECOG PERFORMANCE STATUS: 0 - Asymptomatic  Filed Vitals:   03/03/15 1403  BP: 129/77  Pulse: 63  Temp: 98.6 F (37 C)  Resp: 18   Filed Weights   03/03/15 1403  Weight: 227 lb 9.6 oz (103.239 kg)    Physical Exam  Constitutional: He is oriented to person, place, and time and well-developed, well-nourished, and in no distress.  HENT:  Head: Normocephalic and atraumatic.  Nose: Nose normal.  Mouth/Throat: Oropharynx is clear and moist. No oropharyngeal exudate.  Eyes: Conjunctivae and EOM are normal. Pupils are equal, round, and reactive to light. Right eye exhibits no discharge. Left eye exhibits no discharge. No scleral icterus.  Neck: Normal range of motion. Neck supple. No tracheal deviation present. No thyromegaly present.  Cardiovascular: Normal rate, regular rhythm and normal  heart sounds.  Exam reveals no gallop and no friction rub.   No murmur heard. Pulmonary/Chest: Effort normal and breath sounds normal. He has no wheezes. He has no rales.  Abdominal: Soft. Bowel sounds are normal. He exhibits no distension and no mass. There is no tenderness. There is no rebound and no guarding.  Musculoskeletal: Normal range of motion. He exhibits no edema.  Lymphadenopathy:    He has no cervical adenopathy.  Neurological: He is alert and oriented to person, place, and time. He has normal reflexes. No cranial nerve deficit. Gait normal. Coordination normal.  Skin: Skin is warm and dry. No rash noted.  Psychiatric: Mood, memory, affect and judgment normal.  Nursing Atkinson and vitals reviewed.   LABORATORY DATA:  I have reviewed the data as  listed Lab Results  Component Value Date   WBC 10.1 03/03/2015   HGB 12.8* 03/03/2015   HCT 38.5* 03/03/2015   MCV 91.9 03/03/2015   PLT 270 03/03/2015     Chemistry      Component Value Date/Time   NA 138 11/13/2014 1600   K 3.4* 11/13/2014 1600   CL 103 11/13/2014 1600   CO2 26 11/13/2014 1600   BUN 23 11/13/2014 1600   CREATININE 1.48* 11/13/2014 1600      Component Value Date/Time   CALCIUM 9.2 11/13/2014 1600   ALKPHOS 51 11/13/2014 1600   AST 21 11/13/2014 1600   ALT 22 11/13/2014 1600   BILITOT 0.4 11/13/2014 1600      ASSESSMENT & PLAN:   Anemia Hypogonadism CKD Elevated inflammatory markers including haptoglogin, CRP  59 year old male with mild anemia. Evaluation has revealed multiple contributing factors including chronic kidney disease, probable anemia of chronic disease, and hypogonadism. We discussed proceeding with a bone marrow biopsy but I again advised him I currently feel comfortable with observation. We opted to continue to proceed with observation at this time, I will see him back again in 4 months with repeat laboratory studies. We will recheck laboratory studies including iron levels at  follow-up.  He was advised to stay off of his oral iron.   Orders Placed This Encounter  Procedures  . CBC with Differential    Standing Status: Future     Number of Occurrences:      Standing Expiration Date: 03/02/2016  . Ferritin    Standing Status: Future     Number of Occurrences:      Standing Expiration Date: 03/02/2016  . Sedimentation rate    Standing Status: Future     Number of Occurrences:      Standing Expiration Date: 03/02/2016    Follow up to review blood work.  All questions were answered. The patient knows to call the clinic with any problems, questions or concerns.  This document serves as a record of services personally performed by Ancil Linsey, MD. It was created on her behalf by Pearlie Oyster, a trained medical scribe. The creation of this record is based on the scribe's personal observations and the provider's statements to them. This document has been checked and approved by the attending provider.    I have reviewed the above documentation for accuracy and completeness, and I agree with the above.  Kelby Fam. Penland MD

## 2015-03-04 NOTE — Progress Notes (Signed)
LABS DRAWN

## 2015-03-05 ENCOUNTER — Encounter (HOSPITAL_COMMUNITY): Payer: Self-pay | Admitting: Hematology & Oncology

## 2015-07-06 ENCOUNTER — Encounter (HOSPITAL_COMMUNITY): Payer: Managed Care, Other (non HMO) | Attending: Hematology & Oncology | Admitting: Hematology & Oncology

## 2015-07-06 ENCOUNTER — Encounter (HOSPITAL_COMMUNITY): Payer: Self-pay | Admitting: Hematology & Oncology

## 2015-07-06 ENCOUNTER — Encounter (HOSPITAL_COMMUNITY): Payer: Managed Care, Other (non HMO) | Attending: Hematology & Oncology

## 2015-07-06 VITALS — BP 122/83 | HR 66 | Temp 98.4°F | Resp 18 | Wt 235.8 lb

## 2015-07-06 DIAGNOSIS — D649 Anemia, unspecified: Secondary | ICD-10-CM | POA: Diagnosis not present

## 2015-07-06 DIAGNOSIS — R7989 Other specified abnormal findings of blood chemistry: Secondary | ICD-10-CM | POA: Insufficient documentation

## 2015-07-06 LAB — SEDIMENTATION RATE: Sed Rate: 8 mm/hr (ref 0–16)

## 2015-07-06 LAB — CBC WITH DIFFERENTIAL/PLATELET
Basophils Absolute: 0.1 10*3/uL (ref 0.0–0.1)
Basophils Relative: 1 %
EOS PCT: 14 %
Eosinophils Absolute: 1.6 10*3/uL — ABNORMAL HIGH (ref 0.0–0.7)
HCT: 40.6 % (ref 39.0–52.0)
Hemoglobin: 13.9 g/dL (ref 13.0–17.0)
LYMPHS ABS: 3.8 10*3/uL (ref 0.7–4.0)
Lymphocytes Relative: 33 %
MCH: 31.6 pg (ref 26.0–34.0)
MCHC: 34.2 g/dL (ref 30.0–36.0)
MCV: 92.3 fL (ref 78.0–100.0)
Monocytes Absolute: 0.8 10*3/uL (ref 0.1–1.0)
Monocytes Relative: 7 %
Neutro Abs: 5.3 10*3/uL (ref 1.7–7.7)
Neutrophils Relative %: 45 %
PLATELETS: 271 10*3/uL (ref 150–400)
RBC: 4.4 MIL/uL (ref 4.22–5.81)
RDW: 13 % (ref 11.5–15.5)
WBC: 11.6 10*3/uL — AB (ref 4.0–10.5)

## 2015-07-06 LAB — FERRITIN: FERRITIN: 384 ng/mL — AB (ref 24–336)

## 2015-07-06 NOTE — Progress Notes (Signed)
[facility] PROGRESS NOTE  Patient Care Team: Everardo Beals, NP as PCP - [facility] / Jacques Earthly*  CHIEF COMPLAINTS/PURPOSE OF CONSULTATION:  Anemia Colonoscopy in 09/2013 with Dr. Sydell Axon, normal. History of colon polyps. CKD Mild elevation in CRP Iron studies c/w anemia of chronic disease, elevated haptoglobin Hypogonadism with serum testosterone of 237 ng/dl (Patient was taking oral iron and advised to discontinue on 12/03/2014)  HISTORY OF PRESENTING ILLNESS:  Bryan Atkinson 58 y.o. male is here because of anemia.  He reports being started on iron pills several years ago for iron deficiency anemia.  He used to donate blood and states he went through a period where he could not donate because of anemia.   He is here for ongoing observation. He has not had a BMBX.  The patient presents today for blood work.  He has been doing well.  He has started taking vinegar, honey, and cinnamon that helps him with various issues.  He has no other concerns or complaints at this time.  He would like to go back to visiting only his primary, Everardo Beals, if his lab results are stable.  He takes baby aspirin and vitamins daily.  He no longer takes iron.  Ferritin level eight months ago was 816 NG/ML, ferritin level today is 384 NG/ML. Sedimentation rate eight months ago was 26 mm/hr and today is normal at 8 mm/hr.  MEDICAL HISTORY:  Past Medical History  Diagnosis Date  . Hypertension   . Diabetes mellitus without complication   . Arthritis   . Gout     SURGICAL HISTORY: Past Surgical History  Procedure Laterality Date  . No past surgeries    . Leg surgery Right early 80's  . Colonoscopy      times two 2002 ,2007  . Colonoscopy N/A 10/07/2013    Procedure: COLONOSCOPY;  Surgeon: Daneil Dolin, MD;  Location: [facility];  Service: Endoscopy;  Laterality: N/A;  8:30 AM    SOCIAL HISTORY: Social History   Social  History  . Marital Status: Married    Spouse Name: N/A  . Number of Children: N/A  . Years of Education: N/A   Occupational History  . 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 Smoking status: Never Smoker   . Smokeless tobacco: Never Used  . Alcohol Use: No  . Drug Use: No  . Sexual Activity: Not on file   Other Topics Concern  . Not on file   Social History Narrative   Daughter is 42, Son is 75.  3 grandchildren. Married for 81 years    FAMILY HISTORY: Family History  Problem Relation Age of Onset  . Colon cancer Mother     She died from her malignancy, 63  . Lung cancer Father     Died from lung cancer, age 58  . Emphysema Brother     Died at 65 from lung disease   indicated that his mother is deceased.   ALLERGIES:  has No Known Allergies.  MEDICATIONS:  Current Outpatient Prescriptions  Medication Sig Dispense Refill  . allopurinol (ZYLOPRIM) 300 MG tablet Take 300 mg by mouth daily.    Marland Kitchen aspirin 81 MG tablet Take 81 mg by mouth daily.    Marland Kitchen atenolol (TENORMIN) 100 MG tablet Take 100 mg by mouth 2 (two) times daily.    Marland Kitchen  Canagliflozin-Metformin HCl 50-1000 MG TABS Take 1 tablet by mouth 2 (two) times daily.    Marland Kitchen Dextromethorphan-Guaifenesin (ROBITUSSIN DM PO) Take 10 mLs by mouth as needed.    . fenofibrate (TRICOR) 145 MG tablet Take 145 mg by mouth daily.    Marland Kitchen glucose blood test strip 1 each by Other route as needed for other. Use as instructed    . guanFACINE (TENEX) 2 MG tablet Take 2 mg by mouth at bedtime.    Marland Kitchen lisinopril-hydrochlorothiazide (PRINZIDE,ZESTORETIC) 20-25 MG per tablet Take 1 tablet by mouth daily.    . meloxicam (MOBIC) 15 MG tablet Take 15 mg by mouth daily.    . Multiple Vitamin (MULTIVITAMIN) tablet Take 1 tablet by mouth daily.    . niacin (NIASPAN) 1000 MG CR tablet Take 1,000 mg by mouth at bedtime.    . Omega-3 Fatty Acids (FISH OIL) 1000 MG CAPS Take 2,000 mg by mouth 2 (two)  times daily.     . pravastatin (PRAVACHOL) 40 MG tablet Take 40 mg by mouth 2 (two) times daily.     . ferrous sulfate 325 (65 FE) MG tablet Take 325 mg by mouth 2 (two) times daily with a meal.    . omeprazole (PRILOSEC) 20 MG capsule Take 20 mg by mouth daily.     No current facility-administered medications for this visit.    Review of Systems  Constitutional: Negative for fever, chills and weight loss.  HENT: Negative for congestion, hearing loss, nosebleeds, sore throat and tinnitus.   Eyes: Negative for blurred vision, double vision, pain and discharge.  Respiratory: Negative for cough, hemoptysis, sputum production, shortness of breath and wheezing.   Cardiovascular: Negative for chest pain, palpitations, claudication, leg swelling and PND.  Gastrointestinal: Negative for heartburn, nausea, vomiting, abdominal pain, diarrhea, constipation, blood in stool and melena.  Genitourinary: Negative for dysuria, urgency, frequency and hematuria.  Musculoskeletal: Negative for myalgias, joint pain and falls.  Skin: Negative for itching and rash.  Neurological: Negative for dizziness, tingling, tremors, sensory change, speech change, focal weakness, seizures, loss of consciousness, weakness and headaches.  Endo/Heme/Allergies: Does not bruise/bleed easily.  Psychiatric/Behavioral: Negative for depression, suicidal ideas, memory loss and substance abuse. The patient is not nervous/anxious and does not have insomnia.   14 point review of systems was performed and is negative except as detailed under history of present illness and above   PHYSICAL EXAMINATION:  ECOG PERFORMANCE STATUS: 0 - Asymptomatic  Filed Vitals:   07/06/15 1402  BP: 122/83  Pulse: 66  Temp: 98.4 F (36.9 C)  Resp: 18   Filed Weights   07/06/15 1402  Weight: 235 lb 12.8 oz (106.958 kg)    Physical Exam  Constitutional: He is oriented to person, place, and time and well-developed, well-nourished, and in no  distress.  HENT:  Head: Normocephalic and atraumatic.  Nose: Nose normal.  Mouth/Throat: Oropharynx is clear and moist. No oropharyngeal exudate.  Eyes: Conjunctivae and EOM are normal. Pupils are equal, round, and reactive to light. Right eye exhibits no discharge. Left eye exhibits no discharge. No scleral icterus.  Neck: Normal range of motion. Neck supple. No tracheal deviation present. No thyromegaly present.  Cardiovascular: Normal rate, regular rhythm and normal heart sounds.  Exam reveals no gallop and no friction rub.   No murmur heard. Pulmonary/Chest: Effort normal and breath sounds normal. He has no wheezes. He has no rales.  Abdominal: Soft. Bowel sounds are normal. He exhibits no distension and no mass. There is  no tenderness. There is no rebound and no guarding.  Musculoskeletal: Normal range of motion. He exhibits no edema.  Lymphadenopathy:    He has no cervical adenopathy.  Neurological: He is alert and oriented to person, place, and time. He has normal reflexes. No cranial nerve deficit. Gait normal. Coordination normal.  Skin: Skin is warm and dry. No rash noted.  Psychiatric: Mood, memory, affect and judgment normal.  Nursing note and vitals reviewed.   LABORATORY DATA:  I have reviewed the data as listed Lab Results  Component Value Date   WBC 11.6* 07/06/2015   HGB 13.9 07/06/2015   HCT 40.6 07/06/2015   MCV 92.3 07/06/2015   PLT 271 07/06/2015     Chemistry      Component Value Date/Time   NA 138 11/13/2014 1600   K 3.4* 11/13/2014 1600   CL 103 11/13/2014 1600   CO2 26 11/13/2014 1600   BUN 23 11/13/2014 1600   CREATININE 1.48* 11/13/2014 1600      Component Value Date/Time   CALCIUM 9.2 11/13/2014 1600   ALKPHOS 51 11/13/2014 1600   AST 21 11/13/2014 1600   ALT 22 11/13/2014 1600   BILITOT 0.4 11/13/2014 1600      ASSESSMENT & PLAN:   Anemia Hypogonadism CKD Elevated inflammatory markers including haptoglogin, CRP  59 year old male  with mild anemia. Evaluation has revealed multiple contributing factors including chronic kidney disease, probable anemia of chronic disease, and hypogonadism. BMBX has not been performed.  He was advised to stay off of his oral iron. Inflammatory markers are improved. Hgb/Hct are normal today. He has a mild leukocytosis. I would prefer ongoing observation but the patient desires discharge back to his primary.  I advised him that if his labs become problematic again he can return for additional evaluation.  All questions were answered. The patient knows to call the clinic with any problems, questions or concerns.   Follow up as needed.  This document serves as a record of services personally performed by Ancil Linsey, MD. It was created on her behalf by Janace Hoard, a trained medical scribe. The creation of this record is based on the scribe's personal observations and the [facility]'s statements to them. This document has been checked and approved by the attending Kamyia Thomason.    I have reviewed the above documentation for accuracy and completeness, and I agree with the above.  This note was electronically signed.  Kelby Fam. Penland MD

## 2015-07-06 NOTE — Patient Instructions (Signed)
..  [place] at [facility] Discharge Instructions  RECOMMENDATIONS MADE BY THE CONSULTANT AND ANY TEST RESULTS WILL BE SENT TO YOUR REFERRING PHYSICIAN.  Return as needed  Thank you for choosing [place] at [facility] to provide your oncology and hematology care.  To afford each patient quality time with our provider, please arrive at least 15 minutes before your scheduled appointment time.    You need to re-schedule your appointment should you arrive 10 or more minutes late.  We strive to give you quality time with our providers, and arriving late affects you and other patients whose appointments are after yours.  Also, if you no show three or more times for appointments you may be dismissed from the clinic at the providers discretion.     Again, thank you for choosing [facility].  Our hope is that these requests will decrease the amount of time that you wait before being seen by our physicians.       _____________________________________________________________  Should you have questions after your visit to [facility], please contact our office at [phone] between the hours of 8:30 a.m. and 4:30 p.m.  Voicemails left after 4:30 p.m. will not be returned until the following business day.  For prescription refill requests, have your pharmacy contact our office.

## 2015-07-08 NOTE — Progress Notes (Signed)
Labs drawn

## 2018-08-06 ENCOUNTER — Ambulatory Visit (INDEPENDENT_AMBULATORY_CARE_PROVIDER_SITE_OTHER): Payer: Self-pay

## 2018-08-06 ENCOUNTER — Ambulatory Visit: Payer: Managed Care, Other (non HMO)

## 2018-08-06 DIAGNOSIS — Z1211 Encounter for screening for malignant neoplasm of colon: Secondary | ICD-10-CM

## 2018-08-06 MED ORDER — NA SULFATE-K SULFATE-MG SULF 17.5-3.13-1.6 GM/177ML PO SOLN: 1.0000 | ORAL | 0 refills | Status: DC

## 2018-08-06 NOTE — Progress Notes (Addendum)
Gastroenterology Pre-Procedure Review  Request Date:08/06/18 Requesting Physician: [facility]. Previous tcs RMR- 10/07/13- no polyps, but has a hx of hyperplastic polyps in 2002 +[facility]- mother  PATIENT REVIEW QUESTIONS: The patient responded to the following health history questions as indicated:    1. Diabetes Melitis: yes (metformin bid) 2. Joint replacements in the past 12 months: no 3. Major health problems in the past 3 months: no 4. Has an artificial valve or MVP: no 5. Has a defibrillator: no 6. Has been advised in past to take antibiotics in advance of a procedure like teeth cleaning: no 7. Family history of colon cancer: yes (mom age 75)  46. Alcohol Use: yes (occasionally ) 9. History of sleep apnea: no  10. History of coronary artery or other vascular stents placed within the last 12 months: no 11. History of any prior anesthesia complications: no    MEDICATIONS & ALLERGIES:    Patient reports the following regarding taking any blood thinners:   Plavix? no Aspirin? yes (81mg ) Coumadin? no Brilinta? no Xarelto? no Eliquis? no Pradaxa? no Savaysa? no Effient? no  Patient confirms/reports the following medications:  Current Outpatient Medications  Medication Sig Dispense Refill  . allopurinol (ZYLOPRIM) 300 MG tablet Take 300 mg by mouth daily.    Marland Kitchen aspirin 81 MG tablet Take 81 mg by mouth daily.    Marland Kitchen atenolol (TENORMIN) 100 MG tablet Take 100 mg by mouth 2 (two) times daily.    . fenofibrate (TRICOR) 145 MG tablet Take 145 mg by mouth daily.    Marland Kitchen lisinopril-hydrochlorothiazide (PRINZIDE,ZESTORETIC) 20-25 MG per tablet Take 1 tablet by mouth daily.    . metFORMIN (GLUCOPHAGE) 1000 MG tablet Take 1,000 mg by mouth 2 (two) times daily.  5  . Multiple Vitamin (MULTIVITAMIN) tablet Take 1 tablet by mouth daily.    . naproxen (NAPROSYN) 500 MG tablet Take 500 mg by mouth 2 (two) times daily.  1  . niacin (NIASPAN) 1000 MG CR tablet Take  1,000 mg by mouth at bedtime.    . Omega-3 Fatty Acids (FISH OIL) 1000 MG CAPS Take 2,000 mg by mouth 2 (two) times daily.     . pravastatin (PRAVACHOL) 40 MG tablet pravastatin 40 mg tablet  TAKE 1 TABLET DAILY    . glucose blood test strip 1 each by Other route as needed for other. Use as instructed     No current facility-administered medications for this visit.     Patient confirms/reports the following allergies:  No Known Allergies  No orders of the defined types were placed in this encounter.   AUTHORIZATION INFORMATION Primary Insurance:  Cherlynn June,  [place] #: [id number] Pre-Cert / Josem Kaufmann required: no Pre-Cert / Auth #: [id number] 4:08pm 09/27/18   SCHEDULE INFORMATION: Procedure has been scheduled as follows:  Date: 10/05/18, Time: 8:30 Location: [facility] Dr[name]  This Gastroenterology Pre-Precedure Review Form is being routed to the following provider(s): Walden Field NP

## 2018-08-06 NOTE — Patient Instructions (Signed)
Bryan Atkinson  1956/02/25 MRN: [id number]     Procedure Date: 10/05/18 Time to register: 7:30am Place to register: [facility] Procedure Time: 8:30am Scheduled provider: R. Garfield Cornea, MD    PREPARATION FOR COLONOSCOPY WITH SUPREP BOWEL PREP KIT  Note: Suprep Bowel Prep Kit is a split-dose (2day) regimen. Consumption of BOTH 6-ounce bottles is required for a complete prep.  Please notify us immediately if you are diabetic, take iron supplements, or if you are on Coumadin or any other blood thinners.  Please hold the following medications: I will mail you a letter with this information                                                                                                                                                   2 DAYS BEFORE PROCEDURE:  DATE: 10/03/18   DAY: Wednesday Begin clear liquid diet AFTER your lunch meal. NO SOLID FOODS after this point.  1 DAY BEFORE PROCEDURE:  DATE: 10/04/18   DAY: Thursday Continue clear liquids the entire day - NO SOLID FOOD.   Diabetic medications adjustments for today: see letter  At 6:00pm: Complete steps 1 through 4 below, using ONE (1) 6-ounce bottle, before going to bed. Step 1:  Pour ONE (1) 6-ounce bottle of SUPREP liquid into the mixing container.  Step 2:  Add cool drinking water to the 16 ounce line on the container and mix.  Note: Dilute the solution concentrate as directed prior to use. Step 3:  DRINK ALL the liquid in the container. Step 4:  You MUST drink an additional two (2) or more 16 ounce containers of water over the next one (1) hour.   Continue clear liquids.  DAY OF PROCEDURE:   DATE: 10/05/18   DAY: Friday If you take medications for your heart, blood pressure, or breathing, you may take these medications.  Diabetic medications adjustments for today: see letter  5 hours before your procedure at : 3:30am Step 1:  Pour ONE (1) 6-ounce bottle of SUPREP liquid into the mixing container.  Step  2:  Add cool drinking water to the 16 ounce line on the container and mix.  Note: Dilute the solution concentrate as directed prior to use. Step 3:  DRINK ALL the liquid in the container. Step 4:  You MUST drink an additional two (2) or more 16 ounce containers of water over the next one (1) hour. You MUST complete the final glass of water at least 3 hours before your colonoscopy.   Nothing by mouth past:5:30am  You may take your morning medications with sip of water unless we have instructed otherwise.    Please see below for Dietary Information.  CLEAR LIQUIDS INCLUDE:  Water Jello (NOT red in color)   Ice Popsicles (NOT red in color)   Tea (sugar ok, no  milk/cream) Powdered fruit flavored drinks  Coffee (sugar ok, no milk/cream) Gatorade/ Lemonade/ Kool-Aid  (NOT red in color)   Juice: apple, white grape, white cranberry Soft drinks  Clear bullion, consomme, broth (fat free beef/chicken/vegetable)  Carbonated beverages (any kind)  Strained chicken noodle soup Hard Candy   Remember: Clear liquids are liquids that will allow you to see your fingers on the other side of a clear glass. Be sure liquids are NOT red in color, and not cloudy, but CLEAR.  DO NOT EAT OR DRINK ANY OF THE FOLLOWING:  Dairy products of any kind   Cranberry juice Tomato juice / V8 juice   Grapefruit juice Orange juice     Red grape juice  Do not eat any solid foods, including such foods as: cereal, oatmeal, yogurt, fruits, vegetables, creamed soups, eggs, bread, crackers, pureed foods in a blender, etc.   HELPFUL HINTS FOR DRINKING PREP SOLUTION:   Make sure prep is extremely cold. Mix and refrigerate the the morning of the prep. You may also put in the freezer.   You may try mixing some Crystal Light or Country Time Lemonade if you prefer. Mix in small amounts; add more if necessary.  Try drinking through a straw  Rinse mouth with water or a mouthwash between glasses, to remove after-taste.  Try  sipping on a cold beverage /ice/ popsicles between glasses of prep.  Place a piece of sugar-free hard candy in mouth between glasses.  If you become nauseated, try consuming smaller amounts, or stretch out the time between glasses. Stop for 30-60 minutes, then slowly start back drinking.     OTHER INSTRUCTIONS  You will need a responsible adult at least 60 years of age to accompany you and drive you home. This person must remain in the waiting room during your procedure. The hospital will cancel your procedure if you do not have a responsible adult with you.   1. Wear loose fitting clothing that is easily removed. 2. Leave jewelry and other valuables at home.  3. Remove all body piercing jewelry and leave at home. 4. Total time from sign-in until discharge is approximately 2-3 hours. 5. You should go home directly after your procedure and rest. You can resume normal activities the day after your procedure. 6. The day of your procedure you should not:  Drive  Make legal decisions  Operate machinery  Drink alcohol  Return to work   You may call the office (Dept: [phone]) before 5:00pm, or page the doctor on call [phone]) after 5:00pm, for further instructions, if necessary.   Insurance Information YOU WILL NEED TO CHECK WITH YOUR INSURANCE COMPANY FOR THE BENEFITS OF COVERAGE YOU HAVE FOR THIS PROCEDURE.  UNFORTUNATELY, NOT ALL INSURANCE COMPANIES HAVE BENEFITS TO COVER ALL OR PART OF THESE TYPES OF PROCEDURES.  IT IS YOUR RESPONSIBILITY TO CHECK YOUR BENEFITS, HOWEVER, WE WILL BE GLAD TO ASSIST YOU WITH ANY CODES YOUR INSURANCE COMPANY MAY NEED.    PLEASE NOTE THAT MOST INSURANCE COMPANIES WILL NOT COVER A SCREENING COLONOSCOPY FOR PEOPLE UNDER THE AGE OF 50  IF YOU HAVE BCBS INSURANCE, YOU MAY HAVE BENEFITS FOR A SCREENING COLONOSCOPY BUT IF POLYPS ARE FOUND THE DIAGNOSIS WILL CHANGE AND THEN YOU MAY HAVE A DEDUCTIBLE THAT WILL NEED TO BE MET. SO PLEASE MAKE SURE YOU  CHECK YOUR BENEFITS FOR A SCREENING COLONOSCOPY AS WELL AS A DIAGNOSTIC COLONOSCOPY.

## 2018-08-07 NOTE — Progress Notes (Signed)
Ok to schedule.  DM meds: half night before and none morning of.  On prep day: Check CBG ac and hs as well as if the patient feels like their blood sugar is off. Can use soda, juice (that's in [place]) as needed for any low blood sugar.  Check CBG on arrival to endo unit.

## 2018-08-08 ENCOUNTER — Telehealth: Payer: Self-pay

## 2018-08-08 NOTE — Telephone Encounter (Signed)
Spoke with the pt, he is going to keep the rx for suprep.

## 2018-08-08 NOTE — Progress Notes (Signed)
Letter mailed to the pt with DM medication instructions.  

## 2018-08-08 NOTE — Telephone Encounter (Signed)
PATIENT SAID HE WAS TO CALL IF THE PREP WAS HIGH AND HE SAID HIS PART IS 35$   HE WANTS TO KNOW IF THAT IS AVERAGE?

## 2018-10-05 ENCOUNTER — Other Ambulatory Visit: Payer: Self-pay

## 2018-10-05 ENCOUNTER — Encounter (HOSPITAL_COMMUNITY)
Admission: RE | Disposition: A | Discharge status: Home / Self Care | Payer: Self-pay | Source: Home / Self Care | Attending: Internal Medicine

## 2018-10-05 ENCOUNTER — Encounter (HOSPITAL_COMMUNITY): Payer: Self-pay | Admitting: *Deleted

## 2018-10-05 ENCOUNTER — Ambulatory Visit (HOSPITAL_COMMUNITY)
Admission: RE | Admit: 2018-10-05 | Discharge: 2018-10-05 | Disposition: A | Discharge status: Home / Self Care | Payer: 59 | Attending: Internal Medicine | Admitting: Internal Medicine

## 2018-10-05 DIAGNOSIS — M1990 Unspecified osteoarthritis, unspecified site: Secondary | ICD-10-CM | POA: Insufficient documentation

## 2018-10-05 DIAGNOSIS — E119 Type 2 diabetes mellitus without complications: Secondary | ICD-10-CM | POA: Diagnosis not present

## 2018-10-05 DIAGNOSIS — Z7984 Long term (current) use of oral hypoglycemic drugs: Secondary | ICD-10-CM | POA: Diagnosis not present

## 2018-10-05 DIAGNOSIS — Z79899 Other long term (current) drug therapy: Secondary | ICD-10-CM | POA: Insufficient documentation

## 2018-10-05 DIAGNOSIS — K5730 Diverticulosis of large intestine without perforation or abscess without bleeding: Secondary | ICD-10-CM | POA: Diagnosis not present

## 2018-10-05 DIAGNOSIS — Z1211 Encounter for screening for malignant neoplasm of colon: Secondary | ICD-10-CM | POA: Insufficient documentation

## 2018-10-05 DIAGNOSIS — I10 Essential (primary) hypertension: Secondary | ICD-10-CM | POA: Diagnosis not present

## 2018-10-05 DIAGNOSIS — Z7982 Long term (current) use of aspirin: Secondary | ICD-10-CM | POA: Insufficient documentation

## 2018-10-05 DIAGNOSIS — M109 Gout, unspecified: Secondary | ICD-10-CM | POA: Diagnosis not present

## 2018-10-05 DIAGNOSIS — Z791 Long term (current) use of non-steroidal anti-inflammatories (NSAID): Secondary | ICD-10-CM | POA: Insufficient documentation

## 2018-10-05 DIAGNOSIS — Z800 Family history of malignant neoplasm of digestive organs: Secondary | ICD-10-CM | POA: Insufficient documentation

## 2018-10-05 HISTORY — PX: COLONOSCOPY: SHX5424

## 2018-10-05 SURGERY — COLONOSCOPY
Anesthesia: Moderate Sedation

## 2018-10-05 MED ORDER — MIDAZOLAM HCL 5 MG/5ML IJ SOLN
INTRAMUSCULAR | Status: AC
  Filled 2018-10-05: qty 10

## 2018-10-05 MED ORDER — STERILE WATER FOR IRRIGATION IR SOLN
Status: DC | PRN
  Administered 2018-10-05: 08:00:00

## 2018-10-05 MED ORDER — SODIUM CHLORIDE 0.9 % IV SOLN
INTRAVENOUS | Status: DC
  Administered 2018-10-05: 1000 mL via INTRAVENOUS

## 2018-10-05 MED ORDER — MEPERIDINE HCL 100 MG/ML IJ SOLN
INTRAMUSCULAR | Status: DC | PRN
  Administered 2018-10-05: 25 mg via INTRAVENOUS
  Administered 2018-10-05: 15 mg via INTRAVENOUS

## 2018-10-05 MED ORDER — MIDAZOLAM HCL 5 MG/5ML IJ SOLN
INTRAMUSCULAR | Status: DC | PRN
  Administered 2018-10-05 (×2): 2 mg via INTRAVENOUS
  Administered 2018-10-05: 1 mg via INTRAVENOUS

## 2018-10-05 MED ORDER — ONDANSETRON HCL 4 MG/2ML IJ SOLN
INTRAMUSCULAR | Status: AC
  Filled 2018-10-05: qty 2

## 2018-10-05 MED ORDER — MEPERIDINE HCL 50 MG/ML IJ SOLN
INTRAMUSCULAR | Status: AC
  Filled 2018-10-05: qty 1

## 2018-10-05 MED ORDER — ONDANSETRON HCL 4 MG/2ML IJ SOLN
INTRAMUSCULAR | Status: DC | PRN
  Administered 2018-10-05: 4 mg via INTRAVENOUS

## 2018-10-05 NOTE — Discharge Instructions (Signed)
Colonoscopy Discharge Instructions  Read the instructions outlined below and refer to this sheet in the next few weeks. These discharge instructions provide you with general information on caring for yourself after you leave the hospital. Your doctor may also give you specific instructions. While your treatment has been planned according to the most current medical practices available, unavoidable complications occasionally occur. If you have any problems or questions after discharge, call Dr. Gala Romney at [phone]. ACTIVITY  You may resume your regular activity, but move at a slower pace for the next 24 hours.   Take frequent rest periods for the next 24 hours.   Walking will help get rid of the air and reduce the bloated feeling in your belly (abdomen).   No driving for 24 hours (because of the medicine (anesthesia) used during the test).    Do not sign any important legal documents or operate any machinery for 24 hours (because of the anesthesia used during the test).  NUTRITION  Drink plenty of fluids.   You may resume your normal diet as instructed by your doctor.   Begin with a light meal and progress to your normal diet. Heavy or fried foods are harder to digest and may make you feel sick to your stomach (nauseated).   Avoid alcoholic beverages for 24 hours or as instructed.  MEDICATIONS  You may resume your normal medications unless your doctor tells you otherwise.  WHAT YOU CAN EXPECT TODAY  Some feelings of bloating in the abdomen.   Passage of more gas than usual.   Spotting of blood in your stool or on the toilet paper.  IF YOU HAD POLYPS REMOVED DURING THE COLONOSCOPY:  No aspirin products for 7 days or as instructed.   No alcohol for 7 days or as instructed.   Eat a soft diet for the next 24 hours.  FINDING OUT THE RESULTS OF YOUR TEST Not all test results are available during your visit. If your test results are not back during the visit, make an appointment  with your caregiver to find out the results. Do not assume everything is normal if you have not heard from your caregiver or the medical facility. It is important for you to follow up on all of your test results.  SEEK IMMEDIATE MEDICAL ATTENTION IF:  You have more than a spotting of blood in your stool.   Your belly is swollen (abdominal distention).   You are nauseated or vomiting.   You have a temperature over 101.   You have abdominal pain or discomfort that is severe or gets worse throughout the day.      Colonoscopy, Adult, Care After This sheet gives you information about how to care for yourself after your procedure. Your health care provider may also give you more specific instructions. If you have problems or questions, contact your health care provider. What can I expect after the procedure? After the procedure, it is common to have:  A small amount of blood in your stool for 24 hours after the procedure.  Some gas.  Mild abdominal cramping or bloating. Follow these instructions at home: General instructions  For the first 24 hours after the procedure: ? Do not drive or use machinery. ? Do not sign important documents. ? Do not drink alcohol. ? Do your regular daily activities at a slower pace than normal. ? Eat soft, easy-to-digest foods.  Take over-the-counter or prescription medicines only as told by your health care provider. Relieving cramping and bloating  Try walking around when you have cramps or feel bloated.  Apply heat to your abdomen as told by your health care provider. Use a heat source that your health care provider recommends, such as a moist heat pack or a heating pad. ? Place a towel between your skin and the heat source. ? Leave the heat on for 20-30 minutes. ? Remove the heat if your skin turns bright red. This is especially important if you are unable to feel pain, heat, or cold. You may have a greater risk of getting burned. Eating and  drinking   Drink enough fluid to keep your urine pale yellow.  Resume your normal diet as instructed by your health care provider. Avoid heavy or fried foods that are hard to digest.  Avoid drinking alcohol for as long as instructed by your health care provider. Contact a health care provider if:  You have blood in your stool 2-3 days after the procedure. Get help right away if:  You have more than a small spotting of blood in your stool.  You pass large blood clots in your stool.  Your abdomen is swollen.  You have nausea or vomiting.  You have a fever.  You have increasing abdominal pain that is not relieved with medicine. Summary  After the procedure, it is common to have a small amount of blood in your stool. You may also have mild abdominal cramping and bloating.  For the first 24 hours after the procedure, do not drive or use machinery, sign important documents, or drink alcohol.  Contact your health care provider if you have a lot of blood in your stool, nausea or vomiting, a fever, or increased abdominal pain. This information is not intended to replace advice given to you by your health care provider. Make sure you discuss any questions you have with your health care provider. Document Released: 05/17/2004 Document Revised: 07/26/2017 Document Reviewed: 12/15/2015 Elsevier Interactive Patient Education  2019 [employer].   Repeat screening colonoscopy in 5 years

## 2018-10-05 NOTE — Op Note (Signed)
[facility] Patient Name: Bryan Atkinson Procedure Date: 10/05/2018 7:53 AM MRN: [id number] Date of Birth: [birth year] Attending MD: Norvel Richards , MD CSN: [id number] Age: 62 Admit Type: Outpatient Procedure:                Colonoscopy Indications:              Screening in patient at increased risk: Family                            history of 1st-degree relative with colorectal                            cancer Providers:                Norvel Richards, MD, Hinton Rao, RN, Aram Candela Referring MD:              Medicines:                Midazolam 5 mg IV, Meperidine 40 mg IV Complications:            No immediate complications. Estimated Blood Loss:     Estimated blood loss: none. Procedure:                Pre-Anesthesia Assessment:                           - Prior to the procedure, a History and Physical                            was performed, and patient medications and                            allergies were reviewed. The patient's tolerance of                            previous anesthesia was also reviewed. The risks                            and benefits of the procedure and the sedation                            options and risks were discussed with the patient.                            All questions were answered, and informed consent                            was obtained. Prior Anticoagulants: The patient has                            taken no previous anticoagulant or antiplatelet  agents. ASA Grade Assessment: II - A patient with                            mild systemic disease. After reviewing the risks                            and benefits, the patient was deemed in                            satisfactory condition to undergo the procedure.                           After obtaining informed consent, the colonoscope                            was passed under direct vision.  Throughout the                            procedure, the patient's blood pressure, pulse, and                            oxygen saturations were monitored continuously. The                            CF-HQ190L ([id number]) scope was introduced through                            the anus and advanced to the the cecum, identified                            by appendiceal orifice and ileocecal valve. The                            colonoscopy was performed without difficulty. The                            patient tolerated the procedure well. The quality                            of the bowel preparation was adequate. Scope In: 8:19:51 AM Scope Out: 8:31:08 AM Scope Withdrawal Time: 0 hours 6 minutes 26 seconds  Total Procedure Duration: 0 hours 11 minutes 17 seconds  Findings:      The perianal and digital rectal examinations were normal.      Scattered small-mouthed diverticula were found in the sigmoid colon and       descending colon.      The exam was otherwise without abnormality on direct and retroflexion       views. Impression:               - Diverticulosis in the sigmoid colon and in the                            descending colon.                           -  The examination was otherwise normal on direct                            and retroflexion views.                           - No specimens collected. Moderate Sedation:      Moderate (conscious) sedation was administered by the endoscopy nurse       and supervised by the endoscopist. The following parameters were       monitored: oxygen saturation, heart rate, blood pressure, respiratory       rate, EKG, adequacy of pulmonary ventilation, and response to care.       Total physician intraservice time was 16 minutes. Recommendation:           - Patient has a contact number available for                            emergencies. The signs and symptoms of potential                            delayed complications were discussed  with the                            patient. Return to normal activities tomorrow.                            Written discharge instructions were provided to the                            patient.                           - Resume previous diet.                           - Continue present medications.                           - Await pathology results.                           - Repeat colonoscopy in 5 years for screening                            purposes.                           - Return to GI office PRN. Procedure Code(s):        --- Professional ---                           [phone], Colonoscopy, flexible; diagnostic, including                            collection of specimen(s) by brushing or washing,  when performed (separate procedure)                           G0500, Moderate sedation services provided by the                            same physician or other qualified health care                            professional performing a gastrointestinal                            endoscopic service that sedation supports,                            requiring the presence of an independent trained                            observer to assist in the monitoring of the                            patient's level of consciousness and physiological                            status; initial 15 minutes of intra-service time;                            patient age 67 years or older (additional time may                            be reported with [phone], as appropriate) Diagnosis Code(s):        --- Professional ---                           Z80.0, Family history of malignant neoplasm of                            digestive organs                           K57.30, Diverticulosis of large intestine without                            perforation or abscess without bleeding CPT copyright 2018 American Medical Association. All rights reserved. The codes documented  in this report are preliminary and upon coder review may  be revised to meet current compliance requirements. Cristopher Estimable. [name], MD Norvel Richards, MD 10/05/2018 8:39:34 AM This report has been signed electronically. Number of Addenda: 0

## 2018-10-05 NOTE — H&P (Signed)
_0 @   Primary Care Physician:  Everardo Beals, NP Primary Gastroenterologist:  Dr. Gala Romney  Pre-Procedure History & Physical: HPI:  Bryan Atkinson is a 62 y.o. male is here for a screening colonoscopy.  Mother with colon cancer at a relatively young age.  Negative colonoscopy 5 years ago.  Past Medical History:  Diagnosis Date  . Arthritis   . Diabetes mellitus without complication ([place])   . Gout   . Hypertension     Past Surgical History:  Procedure Laterality Date  . COLONOSCOPY     times two 2002 ,2007  . COLONOSCOPY N/A 10/07/2013   Procedure: COLONOSCOPY;  Surgeon: Daneil Dolin, MD;  Location: [facility];  Service: Endoscopy;  Laterality: N/A;  8:30 AM  . LEG SURGERY Right early 80's  . NO PAST SURGERIES      Prior to Admission medications   Medication Sig Start Date End Date Taking? Authorizing Provider  allopurinol (ZYLOPRIM) 300 MG tablet Take 300 mg by mouth daily.   Yes [provider]  amLODipine (NORVASC) 2.5 MG tablet Take 2.5 mg by mouth daily.   Yes [provider]  aspirin 81 MG tablet Take 81 mg by mouth daily.   Yes [provider]  atenolol (TENORMIN) 50 MG tablet Take 50 mg by mouth 2 (two) times daily.    Yes [provider]  fenofibrate (TRICOR) 145 MG tablet Take 145 mg by mouth daily.   Yes [provider]  lisinopril-hydrochlorothiazide (PRINZIDE,ZESTORETIC) 20-25 MG per tablet Take 1 tablet by mouth 2 (two) times daily.    Yes [provider]  metFORMIN (GLUCOPHAGE) 1000 MG tablet Take 1,000 mg by mouth 2 (two) times daily. 07/21/18  Yes [provider]  Multiple Vitamin (MULTIVITAMIN) tablet Take 1 tablet by mouth daily.   Yes [provider]  Na Sulfate-K Sulfate-Mg Sulf (SUPREP BOWEL PREP KIT) 17.5-3.13-1.6 GM/177ML SOLN Take 1 kit by mouth as directed. 08/06/18  Yes Carlis Stable, NP  naproxen (NAPROSYN) 500 MG tablet Take 500 mg by mouth 2 (two) times daily. 07/12/18   Yes [provider]  niacin (NIASPAN) 1000 MG CR tablet Take 1,000 mg by mouth at bedtime.   Yes [provider]  Omega-3 Fatty Acids (FISH OIL) 1000 MG CAPS Take 1,000 mg by mouth 2 (two) times daily.    Yes [provider]  pravastatin (PRAVACHOL) 40 MG tablet Take 40 mg by mouth daily.    Yes [provider]  glucose blood test strip 1 each by Other route as needed for other. Use as instructed    [provider]    Allergies as of 08/06/2018  . (No Known Allergies)    Family History  Problem Relation Age of Onset  . Colon cancer Mother        She died from her malignancy, 15  . Lung cancer Father        Died from lung cancer, age 85  . Emphysema Brother        Died at 65 from lung disease    Social History   Socioeconomic History  . Marital status: Married    Spouse name: Not on file  . Number of children: Not on file  . Years of education: Not on file  . Highest education level: Not on file  Occupational History  . 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 Emergency planning/management officer  strain: Not on file  . Food insecurity:    Worry: Not on file    Inability: Not on file  . Transportation needs:    Medical: Not on file    Non-medical: Not on file  Tobacco Use  . Smoking status: Never Smoker  . Smokeless tobacco: Never Used  Substance and Sexual Activity  . Alcohol use: No  . Drug use: No  . Sexual activity: Not on file  Lifestyle  . Physical activity:    Days per week: Not on file    Minutes per session: Not on file  . Stress: Not on file  Relationships  . Social connections:    Talks on phone: Not on file    Gets together: Not on file    Attends religious service: Not on file    Active member of club or organization: Not on file    Attends meetings of clubs or organizations: Not on file    Relationship status: Not on file  . Intimate partner violence:     Fear of current or ex partner: Not on file    Emotionally abused: Not on file    Physically abused: Not on file    Forced sexual activity: Not on file  Other Topics Concern  . Not on file  Social History Narrative   Daughter is 72, Son is 108.  3 grandchildren. Married for 34 years    Review of Systems: See HPI, otherwise negative ROS  Physical Exam: BP (!) 130/93   Pulse 66   Temp 98.1 F (36.7 C) (Oral)   Resp (!) 23   Ht _0  (1.753 m)   Wt 99.8 kg   SpO2 100%   BMI 32.49 kg/m  General:   Alert,  Well-developed, well-nourished, pleasant and cooperative in NAD Lungs:  Clear throughout to auscultation.   No wheezes, crackles, or rhonchi. No acute distress. Heart:  Regular rate and rhythm; no murmurs, clicks, rubs,  or gallops. Abdomen:  Soft, nontender and nondistended. No masses, hepatosplenomegaly or hernias noted. Normal bowel sounds, without guarding, and without rebound.    Impression/Plan: Bryan Atkinson is now here to undergo a screening colonoscopy.   high risk screening examination.  Risks, benefits, limitations, imponderables and alternatives regarding colonoscopy have been reviewed with the patient. Questions have been answered. All parties agreeable.     Notice:  This dictation was prepared with Dragon dictation along with smaller phrase technology. Any transcriptional errors that result from this process are unintentional and may not be corrected upon review.

## 2018-10-12 ENCOUNTER — Encounter (HOSPITAL_COMMUNITY): Payer: Self-pay | Admitting: Internal Medicine

## 2019-05-08 NOTE — Patient Instructions (Signed)
Bryan Atkinson  05/08/2019     @PREFPERIOPPHARMACY @   Your procedure is scheduled on  05/13/2019  Report to [facility] at  740  A.M.  Call this number if you have problems the morning of surgery:  [phone]   Remember:  Do not eat or drink after midnight.                      Take these medicines the morning of surgery with A SIP OF WATER  Allopurinol, amlodipine, atenolol.    Do not wear jewelry, make-up or nail polish.  Do not wear lotions, powders, or perfumes, or deodorant.  Do not shave 48 hours prior to surgery.  Men may shave face and neck.  Do not bring valuables to the hospital.  [facility] is not responsible for any belongings or valuables.  Contacts, dentures or bridgework may not be worn into surgery.  Leave your suitcase in the car.  After surgery it may be brought to your room.  For patients admitted to the hospital, discharge time will be determined by your treatment team.  Patients discharged the day of surgery will not be allowed to drive home.   Name and phone number of your driver:   family Special instructions:  DO NOT take any medications for diabetes the morning of your surgery.  Please read over the following fact sheets that you were given. Anesthesia Post-op Instructions and Care and Recovery After Surgery       Cataract Surgery, Care After This sheet gives you information about how to care for yourself after your procedure. Your health care provider may also give you more specific instructions. If you have problems or questions, contact your health care provider. What can I expect after the procedure? After the procedure, it is common to have:  Itching.  Discomfort.  Fluid discharge.  Sensitivity to light and to touch.  Bruising in or around the eye.  Mild blurred vision. Follow these instructions at home: Eye care   Do not touch or rub your eyes.  Protect your eyes as told by your health care provider. You may  be told to wear a protective eye shield or sunglasses.  Do not put a contact lens into the affected eye or eyes until your health care provider approves.  Keep the area around your eye clean and dry: ? Avoid swimming. ? Do not allow water to hit you directly in the face while showering. ? Keep soap and shampoo out of your eyes.  Check your eye every day for signs of infection. Watch for: ? Redness, swelling, or pain. ? Fluid, blood, or pus. ? Warmth. ? A bad smell. ? Vision that is getting worse. ? Sensitivity that is getting worse. Activity  Do not drive for 24 hours if you were given a sedative during your procedure.  Avoid strenuous activities, such as playing contact sports, for as long as told by your health care provider.  Do not drive or use heavy machinery until your health care provider approves.  Do not bend or lift heavy objects. Bending increases pressure in the eye. You can walk, climb stairs, and do light household chores.  Ask your health care provider when you can return to work. If you work in a dusty environment, you may be advised to wear protective eyewear for a period of time. General instructions  Take or apply over-the-counter and prescription medicines only as told by  your health care provider. This includes eye drops.  Keep all follow-up visits as told by your health care provider. This is important. Contact a health care provider if:  You have increased bruising around your eye.  You have pain that is not helped with medicine.  You have a fever.  You have redness, swelling, or pain in your eye.  You have fluid, blood, or pus coming from your incision.  Your vision gets worse.  Your sensitivity to light gets worse. Get help right away if:  You have sudden loss of vision.  You see flashes of light or spots (floaters).  You have severe eye pain.  You develop nausea or vomiting. Summary  After your procedure, it is common to have  itching, discomfort, bruising, fluid discharge, or sensitivity to light.  Follow instructions from your health care provider about caring for your eye after the procedure.  Do not rub your eye after the procedure. You may need to wear eye protection or sunglasses. Do not wear contact lenses. Keep the area around your eye clean and dry.  Avoid activities that require a lot of effort. These include playing sports and lifting heavy objects.  Contact a health care provider if you have increased bruising, pain that does not go away, or a fever. Get help right away if you suddenly lose your vision, see flashes of light or spots, or have severe pain in the eye. This information is not intended to replace advice given to you by your health care provider. Make sure you discuss any questions you have with your health care provider. Document Released: 04/22/2005 Document Revised: 04/02/2018 Document Reviewed: 04/02/2018 Elsevier Patient Education  2020 [employer] After These instructions provide you with information about caring for yourself after your procedure. Your health care provider may also give you more specific instructions. Your treatment has been planned according to current medical practices, but problems sometimes occur. Call your health care provider if you have any problems or questions after your procedure. What can I expect after the procedure? After your procedure, you may:  Feel sleepy for several hours.  Feel clumsy and have poor balance for several hours.  Feel forgetful about what happened after the procedure.  Have poor judgment for several hours.  Feel nauseous or vomit.  Have a sore throat if you had a breathing tube during the procedure. Follow these instructions at home: For at least 24 hours after the procedure:      Have a responsible adult stay with you. It is important to have someone help care for you until you are awake and  alert.  Rest as needed.  Do not: ? Participate in activities in which you could fall or become injured. ? Drive. ? Use heavy machinery. ? Drink alcohol. ? Take sleeping pills or medicines that cause drowsiness. ? Make important decisions or sign legal documents. ? Take care of children on your own. Eating and drinking  Follow the diet that is recommended by your health care provider.  If you vomit, drink water, juice, or soup when you can drink without vomiting.  Make sure you have little or no nausea before eating solid foods. General instructions  Take over-the-counter and prescription medicines only as told by your health care provider.  If you have sleep apnea, surgery and certain medicines can increase your risk for breathing problems. Follow instructions from your health care provider about wearing your sleep device: ? Anytime you are sleeping,  including during daytime naps. ? While taking prescription pain medicines, sleeping medicines, or medicines that make you drowsy.  If you smoke, do not smoke without supervision.  Keep all follow-up visits as told by your health care provider. This is important. Contact a health care provider if:  You keep feeling nauseous or you keep vomiting.  You feel light-headed.  You develop a rash.  You have a fever. Get help right away if:  You have trouble breathing. Summary  For several hours after your procedure, you may feel sleepy and have poor judgment.  Have a responsible adult stay with you for at least 24 hours or until you are awake and alert. This information is not intended to replace advice given to you by your health care provider. Make sure you discuss any questions you have with your health care provider. Document Released: 01/24/2016 Document Revised: 01/01/2018 Document Reviewed: 01/24/2016 Elsevier Patient Education  2020 [employer].

## 2019-05-09 ENCOUNTER — Encounter (HOSPITAL_COMMUNITY)
Admission: RE | Admit: 2019-05-09 | Discharge: 2019-05-09 | Disposition: A | Discharge status: Home / Self Care | Payer: 59 | Source: Ambulatory Visit | Attending: Ophthalmology | Admitting: Ophthalmology

## 2019-05-09 ENCOUNTER — Other Ambulatory Visit: Payer: Self-pay

## 2019-05-09 ENCOUNTER — Other Ambulatory Visit (HOSPITAL_COMMUNITY)
Admission: RE | Admit: 2019-05-09 | Discharge: 2019-05-09 | Disposition: A | Discharge status: Home / Self Care | Payer: 59 | Source: Ambulatory Visit | Attending: Ophthalmology | Admitting: Ophthalmology

## 2019-05-09 ENCOUNTER — Encounter (HOSPITAL_COMMUNITY): Payer: Self-pay

## 2019-05-09 DIAGNOSIS — Z1159 Encounter for screening for other viral diseases: Secondary | ICD-10-CM | POA: Insufficient documentation

## 2019-05-09 DIAGNOSIS — Z01812 Encounter for preprocedural laboratory examination: Secondary | ICD-10-CM | POA: Insufficient documentation

## 2019-05-09 HISTORY — DX: Personal history of urinary calculi: Z87.442

## 2019-05-09 LAB — BASIC METABOLIC PANEL
Anion gap: 10 (ref 5–15)
BUN: 31 mg/dL — ABNORMAL HIGH (ref 8–23)
CO2: 23 mmol/L (ref 22–32)
Calcium: 10 mg/dL (ref 8.9–10.3)
Chloride: 103 mmol/L (ref 98–111)
Creatinine, Ser: 1.67 mg/dL — ABNORMAL HIGH (ref 0.61–1.24)
GFR calc Af Amer: 50 mL/min — ABNORMAL LOW (ref >60)
GFR calc non Af Amer: 44 mL/min — ABNORMAL LOW (ref >60)
Glucose, Bld: 147 mg/dL — ABNORMAL HIGH (ref 70–99)
Potassium: 3.8 mmol/L (ref 3.5–5.1)
Sodium: 136 mmol/L (ref 135–145)

## 2019-05-09 LAB — HEMOGLOBIN A1C
Hgb A1c MFr Bld: 5.9 % — ABNORMAL HIGH (ref 4.8–5.6)
Mean Plasma Glucose: 122.63 mg/dL

## 2019-05-09 LAB — SARS CORONAVIRUS 2 (TAT 6-24 HRS): SARS Coronavirus 2: NEGATIVE

## 2019-05-10 NOTE — Pre-Procedure Instructions (Signed)
HgbA1C routed to PCP. 

## 2019-05-13 ENCOUNTER — Encounter (HOSPITAL_COMMUNITY): Payer: Self-pay | Admitting: Emergency Medicine

## 2019-05-13 ENCOUNTER — Other Ambulatory Visit: Payer: Self-pay

## 2019-05-13 ENCOUNTER — Encounter (HOSPITAL_COMMUNITY)
Admission: RE | Disposition: A | Discharge status: Home / Self Care | Payer: Self-pay | Source: Home / Self Care | Attending: Ophthalmology

## 2019-05-13 ENCOUNTER — Ambulatory Visit (HOSPITAL_COMMUNITY)
Admission: RE | Admit: 2019-05-13 | Discharge: 2019-05-13 | Disposition: A | Discharge status: Home / Self Care | Payer: 59 | Attending: Ophthalmology | Admitting: Ophthalmology

## 2019-05-13 ENCOUNTER — Ambulatory Visit (HOSPITAL_COMMUNITY): Payer: 59 | Admitting: Anesthesiology

## 2019-05-13 DIAGNOSIS — E7800 Pure hypercholesterolemia, unspecified: Secondary | ICD-10-CM | POA: Insufficient documentation

## 2019-05-13 DIAGNOSIS — Z79899 Other long term (current) drug therapy: Secondary | ICD-10-CM | POA: Insufficient documentation

## 2019-05-13 DIAGNOSIS — H269 Unspecified cataract: Secondary | ICD-10-CM | POA: Insufficient documentation

## 2019-05-13 DIAGNOSIS — E1136 Type 2 diabetes mellitus with diabetic cataract: Secondary | ICD-10-CM | POA: Insufficient documentation

## 2019-05-13 DIAGNOSIS — M1990 Unspecified osteoarthritis, unspecified site: Secondary | ICD-10-CM | POA: Diagnosis not present

## 2019-05-13 DIAGNOSIS — I10 Essential (primary) hypertension: Secondary | ICD-10-CM | POA: Diagnosis not present

## 2019-05-13 DIAGNOSIS — Z7984 Long term (current) use of oral hypoglycemic drugs: Secondary | ICD-10-CM | POA: Insufficient documentation

## 2019-05-13 DIAGNOSIS — H2181 Floppy iris syndrome: Secondary | ICD-10-CM | POA: Diagnosis not present

## 2019-05-13 HISTORY — PX: CATARACT EXTRACTION W/PHACO: SHX586

## 2019-05-13 LAB — GLUCOSE, CAPILLARY: Glucose-Capillary: 126 mg/dL — ABNORMAL HIGH (ref 70–99)

## 2019-05-13 SURGERY — PHACOEMULSIFICATION, CATARACT, WITH IOL INSERTION
Anesthesia: Monitor Anesthesia Care | Site: Eye | Laterality: Right

## 2019-05-13 MED ORDER — LIDOCAINE HCL (PF) 1 % IJ SOLN
INTRAOCULAR | Status: DC | PRN
  Administered 2019-05-13: 1 mL via OPHTHALMIC

## 2019-05-13 MED ORDER — CYCLOPENTOLATE-PHENYLEPHRINE 0.2-1 % OP SOLN
1.0000 [drp] | OPHTHALMIC | Status: AC
  Administered 2019-05-13 (×3): 1 [drp] via OPHTHALMIC

## 2019-05-13 MED ORDER — MIDAZOLAM HCL 5 MG/5ML IJ SOLN
INTRAMUSCULAR | Status: DC | PRN
  Administered 2019-05-13: 2 mg via INTRAVENOUS

## 2019-05-13 MED ORDER — LIDOCAINE HCL 3.5 % OP GEL
1.0000 "application " | Freq: Once | OPHTHALMIC | Status: AC
  Administered 2019-05-13: 1 via OPHTHALMIC

## 2019-05-13 MED ORDER — POVIDONE-IODINE 5 % OP SOLN
OPHTHALMIC | Status: DC | PRN
  Administered 2019-05-13: 1 via OPHTHALMIC

## 2019-05-13 MED ORDER — PROVISC 10 MG/ML IO SOLN
INTRAOCULAR | Status: DC | PRN
  Administered 2019-05-13: 0.85 mL via INTRAOCULAR

## 2019-05-13 MED ORDER — EPINEPHRINE PF 1 MG/ML IJ SOLN
INTRAOCULAR | Status: DC | PRN
  Administered 2019-05-13: 500 mL

## 2019-05-13 MED ORDER — MIDAZOLAM HCL 2 MG/2ML IJ SOLN
INTRAMUSCULAR | Status: AC
  Filled 2019-05-13: qty 2

## 2019-05-13 MED ORDER — NEOMYCIN-POLYMYXIN-DEXAMETH 3.5-10000-0.1 OP SUSP
OPHTHALMIC | Status: DC | PRN
  Administered 2019-05-13: 2 [drp] via OPHTHALMIC

## 2019-05-13 MED ORDER — PHENYLEPHRINE HCL 2.5 % OP SOLN
1.0000 [drp] | OPHTHALMIC | Status: AC
  Administered 2019-05-13 (×3): 1 [drp] via OPHTHALMIC

## 2019-05-13 MED ORDER — SODIUM HYALURONATE 23 MG/ML IO SOLN
INTRAOCULAR | Status: DC | PRN
  Administered 2019-05-13: 0.6 mL via INTRAOCULAR

## 2019-05-13 MED ORDER — BSS IO SOLN
INTRAOCULAR | Status: DC | PRN
  Administered 2019-05-13: 15 mL

## 2019-05-13 MED ORDER — LACTATED RINGERS IV SOLN: INTRAVENOUS | Status: DC

## 2019-05-13 MED ORDER — TETRACAINE HCL 0.5 % OP SOLN
1.0000 [drp] | OPHTHALMIC | Status: AC
  Administered 2019-05-13 (×3): 1 [drp] via OPHTHALMIC

## 2019-05-13 SURGICAL SUPPLY — 16 items
CLOTH BEACON ORANGE TIMEOUT ST (SAFETY) ×1 IMPLANT
EYE SHIELD UNIVERSAL CLEAR (GAUZE/BANDAGES/DRESSINGS) ×1 IMPLANT
GLOVE BIOGEL PI IND STRL 6.5 (GLOVE) IMPLANT
GLOVE BIOGEL PI IND STRL 7.0 (GLOVE) IMPLANT
GLOVE BIOGEL PI INDICATOR 6.5 (GLOVE) ×1
GLOVE BIOGEL PI INDICATOR 7.0 (GLOVE) ×1
LENS ALC ACRYL/TECN (Ophthalmic Related) ×1 IMPLANT
NDL HYPO 18GX1.5 BLUNT FILL (NEEDLE) IMPLANT
NEEDLE HYPO 18GX1.5 BLUNT FILL (NEEDLE) ×2 IMPLANT
PAD ARMBOARD 7.5X6 YLW CONV (MISCELLANEOUS) ×1 IMPLANT
RING MALYGIN 7.0 (MISCELLANEOUS) ×1 IMPLANT
SYR TB 1ML LL NO SAFETY (SYRINGE) ×1 IMPLANT
TAPE SURG TRANSPORE 1 IN (GAUZE/BANDAGES/DRESSINGS) IMPLANT
TAPE SURGICAL TRANSPORE 1 IN (GAUZE/BANDAGES/DRESSINGS) ×1
VISCOELASTIC ADDITIONAL (OPHTHALMIC RELATED) ×1 IMPLANT
WATER STERILE IRR 250ML POUR (IV SOLUTION) ×1 IMPLANT

## 2019-05-13 NOTE — Discharge Instructions (Signed)
Please discharge patient when stable, will follow up today with [facility] immediately following discharge.  Leave shield in place until visit.  All paperwork with discharge instructions will be given at the office. ° °

## 2019-05-13 NOTE — Op Note (Signed)
Date of procedure: 05/13/19  Pre-operative diagnosis: Visually significant cataract, Right Eye; Poor Dilation, Right Eye (H25.811; H21.81)  Post-operative diagnosis: Visually significant cataract, Right Eye; Intra-operative Floppy Iris Syndrome, Right Eye  Procedure: Removal of cataract via phacoemulsification and insertion of intra-ocular lens [employer] PCB00  +19.0D into the capsular bag of the Right Eye  Attending surgeon: Gerda Diss. Shanterica Biehler, MD, MA  Anesthesia: MAC, Topical Akten  Complications: None  Estimated Blood Loss: <74m (minimal)  Specimens: None  Implants: As above  Indications:  Visually significant cataract, Right Eye  Procedure:  The patient was seen and identified in the pre-operative area. The operative eye was identified and dilated.  The operative eye was marked.  Topical anesthesia was administered to the operative eye.     The patient was then to the operative suite and placed in the supine position.  A timeout was performed confirming the patient, procedure to be performed, and all other relevant information.   The patient's face was prepped and draped in the usual fashion for intra-ocular surgery.  A lid speculum was placed into the operative eye and the surgical microscope moved into place and focused.  At this point, poor dilation of the iris was observed.  A superotemporal paracentesis was created using a 20 gauge paracentesis blade.  Shugarcaine was injected into the anterior chamber.  The iris displayed significant floppiness during the injection of fluid into the anterior chamber.  Viscoelastic was injected into the anterior chamber.  A temporal clear-corneal main wound incision was created using a 2.41mmicrokeratome.  A 7.002malyugin ring was placed.  A continuous curvilinear capsulorrhexis was initiated using an irrigating cystitome and completed using capsulorrhexis forceps.  Hydrodissection and hydrodeliniation were performed.  Viscoelastic  was injected into the anterior chamber.  A phacoemulsification handpiece and a chopper as a second instrument were used to remove the nucleus and epinucleus. The irrigation/aspiration handpiece was used to remove any remaining cortical material.   The capsular bag was reinflated with viscoelastic, checked, and found to be intact.  The intraocular lens was inserted into the capsular bag and dialed into place using a Kuglen hook.  The Malyugin ring was removed.  The irrigation/aspiration handpiece was used to remove any remaining viscoelastic.  The clear corneal wound and paracentesis wounds were then hydrated and checked with Weck-Cels to be watertight.  The lid-speculum and drape was removed, and the patient's face was cleaned with a wet and dry 4x4.  Maxitrol was instilled in the eye before a clear shield was taped over the eye. The patient was taken to the post-operative care unit in good condition, having tolerated the procedure well.  Post-Op Instructions: [facility] and will receive all other orders and instructions.

## 2019-05-13 NOTE — H&P (Signed)
The H and P was reviewed and updated. The patient was examined.  No changes were found after exam.  The surgical eye was marked.  

## 2019-05-13 NOTE — Transfer of Care (Signed)
Immediate Anesthesia Transfer of Care Note  Patient: Bryan Atkinson  Procedure(s) Performed: CATARACT EXTRACTION PHACO AND INTRAOCULAR LENS PLACEMENT ([facility]) (Right Eye)  Patient Location: Short Stay  Anesthesia Type:MAC  Level of Consciousness: awake, alert , oriented and patient cooperative  Airway & Oxygen Therapy: Patient Spontanous Breathing  Post-op Assessment: Report given to RN and Post -op Vital signs reviewed and stable  Post vital signs: Reviewed and stable  Last Vitals:  Vitals Value Taken Time  BP    Temp    Pulse    Resp    SpO2      Last Pain:  Vitals:   05/13/19 0807  TempSrc: Oral  PainSc: 0-No pain         Complications: No apparent anesthesia complications

## 2019-05-13 NOTE — Anesthesia Postprocedure Evaluation (Signed)
Anesthesia Post Note  Patient: Jeromy Borcherding Zielinski  Procedure(s) Performed: CATARACT EXTRACTION PHACO AND INTRAOCULAR LENS PLACEMENT ([place]) (Right Eye)  Patient location during evaluation: Short Stay Anesthesia Type: MAC Level of consciousness: awake and alert and oriented Pain management: pain level controlled Vital Signs Assessment: post-procedure vital signs reviewed and stable Respiratory status: spontaneous breathing Cardiovascular status: stable Postop Assessment: no apparent nausea or vomiting Anesthetic complications: no     Last Vitals:  Vitals:   05/13/19 0807  BP: (!) 147/92  Pulse: 74  Resp: 18  Temp: 36.6 C  SpO2: 97%    Last Pain:  Vitals:   05/13/19 0807  TempSrc: Oral  PainSc: 0-No pain                 Arli Bree A

## 2019-05-13 NOTE — Anesthesia Preprocedure Evaluation (Signed)
Anesthesia Evaluation  Patient identified by MRN, date of birth, ID band Patient awake    Reviewed: Allergy & Precautions, NPO status , Patient's Chart, lab work & pertinent test results, reviewed documented beta blocker date and time   Airway Mallampati: I  TM Distance: >3 FB Neck ROM: Full    Dental no notable dental hx. (+) Teeth Intact   Pulmonary neg pulmonary ROS,    Pulmonary exam normal breath sounds clear to auscultation       Cardiovascular Exercise Tolerance: Good hypertension, Pt. on medications and Pt. on home beta blockers negative cardio ROS Normal cardiovascular examI Rhythm:Regular Rate:Normal     Neuro/Psych negative neurological ROS  negative psych ROS   GI/Hepatic negative GI ROS, Neg liver ROS,   Endo/Other  negative endocrine ROSdiabetes, Type 2  Renal/GU negative Renal ROS  negative genitourinary   Musculoskeletal  (+) Arthritis , Osteoarthritis,  Bilat Knee issues -states bone on bone    Abdominal   Peds negative pediatric ROS (+)  Hematology negative hematology ROS (+)   Anesthesia Other Findings   Reproductive/Obstetrics negative OB ROS                             Anesthesia Physical Anesthesia Plan  ASA: II  Anesthesia Plan: MAC   Post-op Pain Management:    Induction: Intravenous  PONV Risk Score and Plan: 1 and Treatment may vary due to age or medical condition  Airway Management Planned: Nasal Cannula and Simple Face Mask  Additional Equipment:   Intra-op Plan:   Post-operative Plan:   Informed Consent: I have reviewed the patients History and Physical, chart, labs and discussed the procedure including the risks, benefits and alternatives for the proposed anesthesia with the patient or authorized representative who has indicated his/her understanding and acceptance.     Dental advisory given  Plan Discussed with: CRNA  Anesthesia Plan  Comments: (Plan Full PPE use  Plan MAC with GA avail as needed -WTP with MAC)        Anesthesia Quick Evaluation

## 2019-05-14 ENCOUNTER — Encounter (HOSPITAL_COMMUNITY): Payer: Self-pay | Admitting: Ophthalmology

## 2019-05-14 NOTE — Addendum Note (Signed)
Addendum  created 05/14/19 1453 by Ollen Bowl, CRNA   Intraprocedure Event edited

## 2019-05-22 NOTE — H&P (Signed)
Surgical History & Physical  Patient Name: Bryan Atkinson DOB: 05/29/1956  Surgery: Cataract extraction with intraocular lens implant phacoemulsification; Left Eye  Surgeon: Baruch Goldmann MD Surgery Date:  05/27/2019 Pre-Op Date:  05/20/2019   HPI: A 74 Yr. old male patient 1. 1. The patient complains of difficulty when viewing TV, reading closed caption, news scrolls on TV, which began 3 months ago. The left eye is affected. The episode is gradual. The condition's severity increased since last visit. Symptoms occur when the patient is inside and outside. This is negatively affecting the patient's quality of life. 2. The patient is returning after cataract post-op. The right eye is affected. Status post cataract post-op, which began 1 week ago: Since the last visit, the affected area feels improvement. The patient's vision is improved. Patient is following medication instructions.  Medical History: Diabetes - DM Type 2 LDL  Review of Systems Negative Allergic/Immunologic Negative Cardiovascular Negative Constitutional Negative Ear, Nose, Mouth & Throat Negative Endocrine Negative Eyes Negative Gastrointestinal Negative Genitourinary Negative Hemotologic/Lymphatic Negative Integumentary Negative Musculoskeletal Negative Neurological Negative Psychiatry Negative Respiratory  Social   Never smoked  Medication Metformin, Atenolol, Lisinopril, Pravastatin, Allopurinol, Amlodipine Besylate, Fenofibrate, Niaspan,   Sx/Procedures Phaco c IOL,   Drug Allergies   NKDA  History & Physical: Heent:  Cataract, Left eye NECK: supple without bruits LUNGS: lungs clear to auscultation CV: regular rate and rhythm Abdomen: soft and non-tender  Impression & Plan: Assessment: 1.  COMBINED FORMS AGE RELATED CATARACT; Left Eye (H25.812) 2.  CATARACT EXTRACTION STATUS; Right Eye (Z98.41)  Plan: 1.  Cataract accounts for the patient's decreased vision. This visual impairment is not  correctable with a tolerable change in glasses or contact lenses. Cataract surgery with an implantation of a new lens should significantly improve the visual and functional status of the patient. Discussed all risks, benefits, alternatives, and potential complications. Discussed the procedures and recovery. Patient desires to have surgery. A-scan ordered and performed today for intra-ocular lens calculations. The surgery will be performed in order to improve vision for driving, reading, and for eye examinations. Recommend phacoemulsification with intra-ocular lens. Left Eye. Surgery required to correct imbalance of vision. Dilates poorly - shugacaine by protocol. Malyugin Ring. 2.  1 week after cataract surgery. Doing well with improved vision and normal eye pressure. Call with any problems or concerns. Continue Gati-Brom-Pred 2x/day for 3 more weeks.

## 2019-05-24 ENCOUNTER — Other Ambulatory Visit (HOSPITAL_COMMUNITY)
Admission: RE | Admit: 2019-05-24 | Discharge: 2019-05-24 | Disposition: A | Discharge status: Home / Self Care | Payer: 59 | Source: Ambulatory Visit | Attending: Ophthalmology | Admitting: Ophthalmology

## 2019-05-24 ENCOUNTER — Other Ambulatory Visit: Payer: Self-pay

## 2019-05-24 ENCOUNTER — Encounter (HOSPITAL_COMMUNITY): Payer: Self-pay

## 2019-05-24 ENCOUNTER — Encounter (HOSPITAL_COMMUNITY)
Admission: RE | Admit: 2019-05-24 | Discharge: 2019-05-24 | Disposition: A | Discharge status: Home / Self Care | Payer: 59 | Source: Ambulatory Visit | Attending: Ophthalmology | Admitting: Ophthalmology

## 2019-05-24 DIAGNOSIS — Z20828 Contact with and (suspected) exposure to other viral communicable diseases: Secondary | ICD-10-CM | POA: Insufficient documentation

## 2019-05-24 MED ORDER — PROMETHAZINE HCL 25 MG/ML IJ SOLN: 6.2500 mg | INTRAMUSCULAR | Status: DC | PRN

## 2019-05-24 MED ORDER — MIDAZOLAM HCL 2 MG/2ML IJ SOLN: 0.5000 mg | Freq: Once | INTRAMUSCULAR | Status: DC | PRN

## 2019-05-24 MED ORDER — HYDROMORPHONE HCL 1 MG/ML IJ SOLN: 0.2500 mg | INTRAMUSCULAR | Status: DC | PRN

## 2019-05-24 MED ORDER — KETOROLAC TROMETHAMINE 0.5 % OP SOLN: 1.0000 [drp] | OPHTHALMIC | Status: DC | PRN

## 2019-05-24 MED ORDER — HYDROCODONE-ACETAMINOPHEN 7.5-325 MG PO TABS: 1.0000 | ORAL_TABLET | Freq: Once | ORAL | Status: DC | PRN

## 2019-05-25 LAB — SARS CORONAVIRUS 2 (TAT 6-24 HRS): SARS Coronavirus 2: NEGATIVE

## 2019-05-27 ENCOUNTER — Encounter (HOSPITAL_COMMUNITY): Payer: Self-pay | Admitting: *Deleted

## 2019-05-27 ENCOUNTER — Ambulatory Visit (HOSPITAL_COMMUNITY): Payer: 59 | Admitting: Anesthesiology

## 2019-05-27 ENCOUNTER — Encounter (HOSPITAL_COMMUNITY)
Admission: RE | Disposition: A | Discharge status: Home / Self Care | Payer: Self-pay | Source: Home / Self Care | Attending: Ophthalmology

## 2019-05-27 ENCOUNTER — Other Ambulatory Visit: Payer: Self-pay

## 2019-05-27 ENCOUNTER — Ambulatory Visit (HOSPITAL_COMMUNITY)
Admission: RE | Admit: 2019-05-27 | Discharge: 2019-05-27 | Disposition: A | Discharge status: Home / Self Care | Payer: 59 | Attending: Ophthalmology | Admitting: Ophthalmology

## 2019-05-27 DIAGNOSIS — H268 Other specified cataract: Secondary | ICD-10-CM | POA: Diagnosis present

## 2019-05-27 DIAGNOSIS — M1990 Unspecified osteoarthritis, unspecified site: Secondary | ICD-10-CM | POA: Insufficient documentation

## 2019-05-27 DIAGNOSIS — Z7984 Long term (current) use of oral hypoglycemic drugs: Secondary | ICD-10-CM | POA: Insufficient documentation

## 2019-05-27 DIAGNOSIS — I10 Essential (primary) hypertension: Secondary | ICD-10-CM | POA: Diagnosis not present

## 2019-05-27 DIAGNOSIS — E1136 Type 2 diabetes mellitus with diabetic cataract: Secondary | ICD-10-CM | POA: Diagnosis not present

## 2019-05-27 DIAGNOSIS — H25812 Combined forms of age-related cataract, left eye: Secondary | ICD-10-CM | POA: Insufficient documentation

## 2019-05-27 DIAGNOSIS — Z79899 Other long term (current) drug therapy: Secondary | ICD-10-CM | POA: Insufficient documentation

## 2019-05-27 DIAGNOSIS — H2181 Floppy iris syndrome: Secondary | ICD-10-CM | POA: Diagnosis not present

## 2019-05-27 DIAGNOSIS — H2512 Age-related nuclear cataract, left eye: Secondary | ICD-10-CM | POA: Diagnosis not present

## 2019-05-27 HISTORY — PX: CATARACT EXTRACTION W/PHACO: SHX586

## 2019-05-27 LAB — GLUCOSE, CAPILLARY: Glucose-Capillary: 122 mg/dL — ABNORMAL HIGH (ref 70–99)

## 2019-05-27 SURGERY — PHACOEMULSIFICATION, CATARACT, WITH IOL INSERTION
Anesthesia: Monitor Anesthesia Care | Site: Eye | Laterality: Left

## 2019-05-27 MED ORDER — LIDOCAINE HCL 3.5 % OP GEL
1.0000 "application " | Freq: Once | OPHTHALMIC | Status: AC
  Administered 2019-05-27: 1 via OPHTHALMIC

## 2019-05-27 MED ORDER — POVIDONE-IODINE 5 % OP SOLN
OPHTHALMIC | Status: DC | PRN
  Administered 2019-05-27: 1 via OPHTHALMIC

## 2019-05-27 MED ORDER — LIDOCAINE HCL (PF) 1 % IJ SOLN
INTRAOCULAR | Status: DC | PRN
  Administered 2019-05-27: 1 mL via OPHTHALMIC

## 2019-05-27 MED ORDER — ONDANSETRON HCL 4 MG/2ML IJ SOLN
INTRAMUSCULAR | Status: AC
  Filled 2019-05-27: qty 2

## 2019-05-27 MED ORDER — BSS IO SOLN
INTRAOCULAR | Status: DC | PRN
  Administered 2019-05-27: 15 mL

## 2019-05-27 MED ORDER — LIDOCAINE 2% (20 MG/ML) 5 ML SYRINGE
INTRAMUSCULAR | Status: AC
  Filled 2019-05-27: qty 5

## 2019-05-27 MED ORDER — SODIUM HYALURONATE 23 MG/ML IO SOLN
INTRAOCULAR | Status: DC | PRN
  Administered 2019-05-27: 0.6 mL via INTRAOCULAR

## 2019-05-27 MED ORDER — PHENYLEPHRINE HCL 2.5 % OP SOLN
1.0000 [drp] | OPHTHALMIC | Status: AC | PRN
  Administered 2019-05-27 (×3): 1 [drp] via OPHTHALMIC

## 2019-05-27 MED ORDER — CYCLOPENTOLATE-PHENYLEPHRINE 0.2-1 % OP SOLN
1.0000 [drp] | OPHTHALMIC | Status: AC | PRN
  Administered 2019-05-27 (×3): 1 [drp] via OPHTHALMIC

## 2019-05-27 MED ORDER — TETRACAINE HCL 0.5 % OP SOLN
1.0000 [drp] | OPHTHALMIC | Status: AC | PRN
  Administered 2019-05-27 (×3): 1 [drp] via OPHTHALMIC

## 2019-05-27 MED ORDER — EPINEPHRINE PF 1 MG/ML IJ SOLN
INTRAOCULAR | Status: DC | PRN
  Administered 2019-05-27: 11:00:00 500 mL

## 2019-05-27 MED ORDER — ROCURONIUM BROMIDE 10 MG/ML (PF) SYRINGE
PREFILLED_SYRINGE | INTRAVENOUS | Status: AC
  Filled 2019-05-27: qty 10

## 2019-05-27 MED ORDER — PROVISC 10 MG/ML IO SOLN
INTRAOCULAR | Status: DC | PRN
  Administered 2019-05-27: 0.85 mL via INTRAOCULAR

## 2019-05-27 MED ORDER — NEOMYCIN-POLYMYXIN-DEXAMETH 3.5-10000-0.1 OP SUSP
OPHTHALMIC | Status: DC | PRN
  Administered 2019-05-27: 2 [drp] via OPHTHALMIC

## 2019-05-27 MED ORDER — PHENYLEPHRINE 40 MCG/ML (10ML) SYRINGE FOR IV PUSH (FOR BLOOD PRESSURE SUPPORT)
PREFILLED_SYRINGE | INTRAVENOUS | Status: AC
  Filled 2019-05-27: qty 10

## 2019-05-27 MED ORDER — MIDAZOLAM HCL 5 MG/5ML IJ SOLN
INTRAMUSCULAR | Status: DC | PRN
  Administered 2019-05-27: 2 mg via INTRAVENOUS

## 2019-05-27 MED ORDER — SUCCINYLCHOLINE CHLORIDE 200 MG/10ML IV SOSY
PREFILLED_SYRINGE | INTRAVENOUS | Status: AC
  Filled 2019-05-27: qty 10

## 2019-05-27 MED ORDER — MIDAZOLAM HCL 2 MG/2ML IJ SOLN
INTRAMUSCULAR | Status: AC
  Filled 2019-05-27: qty 2

## 2019-05-27 SURGICAL SUPPLY — 16 items

## 2019-05-27 NOTE — Op Note (Signed)
Date of procedure: 05/27/19  Pre-operative diagnosis: Visually significant cataract, Left Eye; Poor dilation, Left eye (H25.812; H21.81)  Post-operative diagnosis: Visually significant cataract, Left Eye; Intra-operative Floppy Iris Syndrome, Left Eye  Procedure: Complex removal of cataract via phacoemulsification and insertion of intra-ocular lens [employer]  +19.5D into the capsular bag of the Left Eye  Attending surgeon: Gerda Diss. Jorah Hua, MD, MA  Anesthesia: MAC, Topical Akten  Complications: None  Estimated Blood Loss: <30m (minimal)  Specimens: None  Implants: As above  Indications:  Visually significant cataract, Left Eye  Procedure:  The patient was seen and identified in the pre-operative area. The operative eye was identified and dilated.  The operative eye was marked.  Topical anesthesia was administered to the operative eye.     The patient was then to the operative suite and placed in the supine position.  A timeout was performed confirming the patient, procedure to be performed, and all other relevant information.   The patient's face was prepped and draped in the usual fashion for intra-ocular surgery.  A lid speculum was placed into the operative eye and the surgical microscope moved into place and focused.  Poor dilation of the iris was confirmed.  An inferotemporal paracentesis was created using a 20 gauge paracentesis blade.  Shugarcaine was injected into the anterior chamber.  Viscoelastic was injected into the anterior chamber.  A temporal clear-corneal main wound incision was created using a 2.447mmicrokeratome.  A Malyugin ring was placed.  A continuous curvilinear capsulorrhexis was initiated using an irrigating cystitome and completed using capsulorrhexis forceps.  Hydrodissection and hydrodeliniation were performed.  Viscoelastic was injected into the anterior chamber.  A phacoemulsification handpiece and a chopper as a second instrument were  used to remove the nucleus and epinucleus. The irrigation/aspiration handpiece was used to remove any remaining cortical material.   The capsular bag was reinflated with viscoelastic, checked, and found to be intact.  The intraocular lens was inserted into the capsular bag and dialed into place using a Kuglen hook. The Malyugin ring was removed.  The irrigation/aspiration handpiece was used to remove any remaining viscoelastic.  The clear corneal wound and paracentesis wounds were then hydrated and checked with Weck-Cels to be watertight.  The lid-speculum and drape was removed, and the patient's face was cleaned with a wet and dry 4x4.  Maxitrol was instilled in the eye before a clear shield was taped over the eye. The patient was taken to the post-operative care unit in good condition, having tolerated the procedure well.  Post-Op Instructions: [facility] and will receive all other orders and instructions.

## 2019-05-27 NOTE — Anesthesia Postprocedure Evaluation (Signed)
Anesthesia Post Note  Patient: Bryan Atkinson  Procedure(s) Performed: CATARACT EXTRACTION PHACO AND INTRAOCULAR LENS PLACEMENT ([place]) (Left Eye)  Patient location during evaluation: Short Stay Anesthesia Type: MAC Level of consciousness: awake and alert and patient cooperative Pain management: pain level controlled Vital Signs Assessment: post-procedure vital signs reviewed and stable Respiratory status: spontaneous breathing and respiratory function stable Cardiovascular status: blood pressure returned to baseline Postop Assessment: no apparent nausea or vomiting Anesthetic complications: no     Last Vitals:  Vitals:   05/27/19 1031  BP: 128/87  Pulse: 74  Resp: 17  Temp: 36.6 C  SpO2: 98%    Last Pain:  Vitals:   05/27/19 1031  TempSrc: Oral  PainSc: 2                  Hyacinth Marcelli J

## 2019-05-27 NOTE — Discharge Instructions (Signed)
Please discharge patient when stable, will follow up today with Dr. Saleema Weppler at the [facility] office immediately following discharge.  Leave shield in place until visit.  All paperwork with discharge instructions will be given at the office. ° °

## 2019-05-27 NOTE — Interval H&P Note (Signed)
History and Physical Interval Note: The H and P was reviewed and updated. The patient was examined.  No changes were found after exam.  The surgical eye was marked.  05/27/2019 11:03 AM  Bryan Atkinson  has presented today for surgery, with the diagnosis of nuclear cataract left eye.  The various methods of treatment have been discussed with the patient and family. After consideration of risks, benefits and other options for treatment, the patient has consented to  Procedure(s) with comments: CATARACT EXTRACTION PHACO AND INTRAOCULAR LENS PLACEMENT ([place]) (Left) - left as a surgical intervention.  The patient's history has been reviewed, patient examined, no change in status, stable for surgery.  I have reviewed the patient's chart and labs.  Questions were answered to the patient's satisfaction.     Baruch Goldmann

## 2019-05-27 NOTE — Anesthesia Preprocedure Evaluation (Signed)
Anesthesia Evaluation  Patient identified by MRN, date of birth, ID band Patient awake    Reviewed: Allergy & Precautions, NPO status , Patient's Chart, lab work & pertinent test results, reviewed documented beta blocker date and time   Airway Mallampati: I  TM Distance: >3 FB Neck ROM: Full    Dental no notable dental hx. (+) Teeth Intact   Pulmonary neg pulmonary ROS,    Pulmonary exam normal breath sounds clear to auscultation       Cardiovascular Exercise Tolerance: Good hypertension, Pt. on medications and Pt. on home beta blockers negative cardio ROS Normal cardiovascular examI Rhythm:Regular Rate:Normal     Neuro/Psych negative neurological ROS  negative psych ROS   GI/Hepatic negative GI ROS, Neg liver ROS,   Endo/Other  negative endocrine ROSdiabetes, Well Controlled, Type 2, Oral Hypoglycemic Agents  Renal/GU negative Renal ROS  negative genitourinary   Musculoskeletal  (+) Arthritis , Osteoarthritis,    Abdominal   Peds negative pediatric ROS (+)  Hematology negative hematology ROS (+)   Anesthesia Other Findings   Reproductive/Obstetrics negative OB ROS                             Anesthesia Physical Anesthesia Plan  ASA: II  Anesthesia Plan: MAC   Post-op Pain Management:    Induction: Intravenous  PONV Risk Score and Plan: 1 and Treatment may vary due to age or medical condition  Airway Management Planned: Nasal Cannula  Additional Equipment:   Intra-op Plan:   Post-operative Plan: Extubation in OR  Informed Consent: I have reviewed the patients History and Physical, chart, labs and discussed the procedure including the risks, benefits and alternatives for the proposed anesthesia with the patient or authorized representative who has indicated his/her understanding and acceptance.     Dental advisory given  Plan Discussed with: CRNA  Anesthesia Plan  Comments: (Plan MAC -second eye Plan Full PPE use)        Anesthesia Quick Evaluation

## 2019-05-27 NOTE — Transfer of Care (Signed)
Immediate Anesthesia Transfer of Care Note  Patient: Bryan Atkinson  Procedure(s) Performed: CATARACT EXTRACTION PHACO AND INTRAOCULAR LENS PLACEMENT ([facility]) (Left Eye)  Patient Location: PACU  Anesthesia Type:MAC  Level of Consciousness: awake and patient cooperative  Airway & Oxygen Therapy: Patient Spontanous Breathing and Patient connected to nasal cannula oxygen  Post-op Assessment: Report given to RN and Post -op Vital signs reviewed and stable  Post vital signs: Reviewed and stable  Last Vitals:  Vitals Value Taken Time  BP    Temp    Pulse    Resp    SpO2      Last Pain:  Vitals:   05/27/19 1031  TempSrc: Oral  PainSc: 2       Patients Stated Pain Goal: 7 (68/37/29 0211)  Complications: No apparent anesthesia complications

## 2019-05-28 ENCOUNTER — Encounter (HOSPITAL_COMMUNITY): Payer: Self-pay | Admitting: Ophthalmology

## 2019-06-25 ENCOUNTER — Other Ambulatory Visit: Payer: Self-pay

## 2019-06-25 ENCOUNTER — Encounter (HOSPITAL_COMMUNITY)
Admission: RE | Admit: 2019-06-25 | Discharge: 2019-06-25 | Disposition: A | Discharge status: Home / Self Care | Payer: 59 | Source: Ambulatory Visit | Attending: Orthopedic Surgery | Admitting: Orthopedic Surgery

## 2019-06-25 ENCOUNTER — Encounter (HOSPITAL_COMMUNITY): Payer: Self-pay

## 2019-06-25 ENCOUNTER — Ambulatory Visit: Payer: Self-pay | Admitting: Orthopedic Surgery

## 2019-06-25 ENCOUNTER — Other Ambulatory Visit (HOSPITAL_COMMUNITY)
Admission: RE | Admit: 2019-06-25 | Discharge: 2019-06-25 | Disposition: A | Discharge status: Home / Self Care | Payer: 59 | Source: Ambulatory Visit | Attending: Orthopedic Surgery | Admitting: Orthopedic Surgery

## 2019-06-25 LAB — COMPREHENSIVE METABOLIC PANEL
ALT: 25 U/L (ref 0–44)
AST: 22 U/L (ref 15–41)
Albumin: 4.4 g/dL (ref 3.5–5.0)
Alkaline Phosphatase: 39 U/L (ref 38–126)
Anion gap: 11 (ref 5–15)
BUN: 35 mg/dL — ABNORMAL HIGH (ref 8–23)
CO2: 24 mmol/L (ref 22–32)
Calcium: 10.2 mg/dL (ref 8.9–10.3)
Chloride: 103 mmol/L (ref 98–111)
Creatinine, Ser: 1.85 mg/dL — ABNORMAL HIGH (ref 0.61–1.24)
GFR calc Af Amer: 45 mL/min — ABNORMAL LOW (ref >60)
GFR calc non Af Amer: 38 mL/min — ABNORMAL LOW (ref >60)
Glucose, Bld: 102 mg/dL — ABNORMAL HIGH (ref 70–99)
Potassium: 4.1 mmol/L (ref 3.5–5.1)
Sodium: 138 mmol/L (ref 135–145)
Total Bilirubin: 0.7 mg/dL (ref 0.3–1.2)
Total Protein: 8.1 g/dL (ref 6.5–8.1)

## 2019-06-25 LAB — URINALYSIS, ROUTINE W REFLEX MICROSCOPIC
Bilirubin Urine: NEGATIVE
Glucose, UA: NEGATIVE mg/dL
Hgb urine dipstick: NEGATIVE
Ketones, ur: NEGATIVE mg/dL
Leukocytes,Ua: NEGATIVE
Nitrite: NEGATIVE
Protein, ur: NEGATIVE mg/dL
Specific Gravity, Urine: 1.011 (ref 1.005–1.030)
pH: 6 (ref 5.0–8.0)

## 2019-06-25 LAB — CBC
HCT: 42.5 % (ref 39.0–52.0)
Hemoglobin: 13.9 g/dL (ref 13.0–17.0)
MCH: 30.7 pg (ref 26.0–34.0)
MCHC: 32.7 g/dL (ref 30.0–36.0)
MCV: 93.8 fL (ref 80.0–100.0)
Platelets: 380 10*3/uL (ref 150–400)
RBC: 4.53 MIL/uL (ref 4.22–5.81)
RDW: 13.2 % (ref 11.5–15.5)
WBC: 9.4 10*3/uL (ref 4.0–10.5)
nRBC: 0 % (ref 0.0–0.2)

## 2019-06-25 LAB — PROTIME-INR
INR: 1 (ref 0.8–1.2)
Prothrombin Time: 13.4 seconds (ref 11.4–15.2)

## 2019-06-25 LAB — SURGICAL PCR SCREEN
MRSA, PCR: NEGATIVE
Staphylococcus aureus: POSITIVE — AB

## 2019-06-25 LAB — GLUCOSE, CAPILLARY: Glucose-Capillary: 79 mg/dL (ref 70–99)

## 2019-06-25 NOTE — Patient Instructions (Signed)
DUE TO COVID-19 ONLY ONE VISITOR IS ALLOWED TO COME WITH YOU AND STAY IN THE WAITING ROOM ONLY DURING PRE OP AND PROCEDURE DAY OF SURGERY.  THE 1 VISITOR MAY VISIT WITH YOU AFTER SURGERY IN YOUR PRIVATE ROOM DURING VISITING HOURS ONLY!  YOU NEED TO HAVE A COVID 19 TEST ON_9/8______ @__2 :20_____, THIS TEST MUST BE DONE BEFORE SURGERY, COME  [address], [place] , [id number].  ([place]) ONCE YOUR COVID TEST IS COMPLETED, PLEASE BEGIN THE QUARANTINE INSTRUCTIONS AS OUTLINED IN YOUR HANDOUT.                Gavin Pound Thall   Your procedure is scheduled on: (/10/20   Report to [facility] Main  Entrance   Report to admitting at   7:50 AM     Call this number if you have problems the morning of surgery [place], NO [place].   Do not eat food After Midnight. YOU MAY HAVE CLEAR LIQUIDS FROM MIDNIGHT UNTIL 4:30AM.  At 4:30AM Please finish the prescribed Pre-Surgery Gatorade drink. Nothing by mouth after you finish the Gatorade drink !   Take these medicines the morning of surgery with A SIP OF WATER: Atenolol, Amlodipine,Allopurinol  DO NOT TAKE ANY DIABETIC MEDICATIONS DAY OF YOUR SURGERY                               You may not have any metal on your body including hair pins and              piercings         Do not wear jewelry, make-up, lotions, powders or perfumes, deodorant                   Men may shave face and neck.   Do not bring valuables to the hospital. [place].  Contacts, dentures or bridgework may not be worn into surgery.       Patients discharged the day of surgery will not be allowed to drive home.  IF YOU ARE HAVING SURGERY AND GOING HOME THE SAME DAY, YOU MUST HAVE AN ADULT TO DRIVE YOU HOME AND BE WITH YOU FOR 24 HOURS. YOU MAY GO HOME BY TAXI OR UBER OR ORTHERWISE, BUT AN ADULT MUST  ACCOMPANY YOU HOME AND STAY WITH YOU FOR 24 HOURS.  Name and phone number of your driver:  Special Instructions: N/A              Please read over the following fact sheets you were given: _____________________________________________________________________             [facility] - Preparing for Surgery Before surgery, you can play an important role.   Because skin is not sterile, your skin needs to be as free of germs as possible.   You can reduce the number of germs on your skin by washing with CHG (chlorahexidine gluconate) soap before surgery .  CHG is an antiseptic cleaner which kills germs and bonds with the skin to continue killing germs even after washing. Please DO NOT use if you have an allergy to CHG or antibacterial soaps.  If your skin becomes reddened/irritated stop using the CHG and  inform your nurse when you arrive at Short Stay. Do not shave (including legs and underarms) for at least 48 hours prior to the first CHG shower.  You may shave your face/neck. Please follow these instructions carefully:  1.  Shower with CHG Soap the night before surgery and the  morning of Surgery.  2.  If you choose to wash your hair, wash your hair first as usual with your  normal  shampoo.  3.  After you shampoo, rinse your hair and body thoroughly to remove the  shampoo.                                       4.  Use CHG as you would any other liquid soap.  You can apply chg directly  to the skin and wash                       Gently with a scrungie or clean washcloth.  5.  Apply the CHG Soap to your body ONLY FROM THE NECK DOWN.   Do not use on face/ open                           Wound or open sores. Avoid contact with eyes, ears mouth and genitals (private parts).                       Wash face,  Genitals (private parts) with your normal soap.             6.  Wash thoroughly, paying special attention to the area where your surgery  will be performed.  7.  Thoroughly rinse your body with  warm water from the neck down.  8.  DO NOT shower/wash with your normal soap after using and rinsing off  the CHG Soap.                9.  Pat yourself dry with a clean towel.            10.  Wear clean pajamas.            11.  Place clean sheets on your bed the night of your first shower and do not  sleep with pets. Day of Surgery : Do not apply any lotions/deodorants the morning of surgery.  Please wear clean clothes to the hospital/surgery center.  FAILURE TO FOLLOW THESE INSTRUCTIONS MAY RESULT IN THE CANCELLATION OF YOUR SURGERY PATIENT SIGNATURE_________________________________  NURSE SIGNATURE__________________________________  ________________________________________________________________________   Adam Phenix  An incentive spirometer is a tool that can help keep your lungs clear and active. This tool measures how well you are filling your lungs with each breath. Taking long deep breaths may help reverse or decrease the chance of developing breathing (pulmonary) problems (especially infection) following:  A long period of time when you are unable to move or be active. BEFORE THE PROCEDURE   If the spirometer includes an indicator to show your best effort, your nurse or respiratory therapist will set it to a desired goal.  If possible, sit up straight or lean slightly forward. Try not to slouch.  Hold the incentive spirometer in an upright position. INSTRUCTIONS FOR USE  1. Sit on the edge of your bed if possible, or sit up as far as you can in bed or  on a chair. 2. Hold the incentive spirometer in an upright position. 3. Breathe out normally. 4. Place the mouthpiece in your mouth and seal your lips tightly around it. 5. Breathe in slowly and as deeply as possible, raising the piston or the ball toward the top of the column. 6. Hold your breath for 3-5 seconds or for as long as possible. Allow the piston or ball to fall to the bottom of the column. 7. Remove the  mouthpiece from your mouth and breathe out normally. 8. Rest for a few seconds and repeat Steps 1 through 7 at least 10 times every 1-2 hours when you are awake. Take your time and take a few normal breaths between deep breaths. 9. The spirometer may include an indicator to show your best effort. Use the indicator as a goal to work toward during each repetition. 10. After each set of 10 deep breaths, practice coughing to be sure your lungs are clear. If you have an incision (the cut made at the time of surgery), support your incision when coughing by placing a pillow or rolled up towels firmly against it. Once you are able to get out of bed, walk around indoors and cough well. You may stop using the incentive spirometer when instructed by your caregiver.  RISKS AND COMPLICATIONS  Take your time so you do not get dizzy or light-headed.  If you are in pain, you may need to take or ask for pain medication before doing incentive spirometry. It is harder to take a deep breath if you are having pain. AFTER USE  Rest and breathe slowly and easily.  It can be helpful to keep track of a log of your progress. Your caregiver can provide you with a simple table to help with this. If you are using the spirometer at home, follow these instructions: [place] IF:   You are having difficultly using the spirometer.  You have trouble using the spirometer as often as instructed.  Your pain medication is not giving enough relief while using the spirometer.  You develop fever of 100.5 F (38.1 C) or higher. SEEK IMMEDIATE MEDICAL CARE IF:   You cough up bloody sputum that had not been present before.  You develop fever of 102 F (38.9 C) or greater.  You develop worsening pain at or near the incision site. MAKE SURE YOU:   Understand these instructions.  Will watch your condition.  Will get help right away if you are not doing well or get worse. Document Released: 02/13/2007 Document  Revised: 12/26/2011 Document Reviewed: 04/16/2007 [facility] Patient Information 2014 [place], [place].   ________________________________________________________________________

## 2019-06-25 NOTE — Progress Notes (Signed)
PCP - Barbee Shropshire NP Cardiologist - none  Chest x-ray - none EKG -06/25/19  Stress Test - no ECHO - no Cardiac Cath - no  Sleep Study - no CPAP -   Fasting Blood Sugar - 110-120s Checks Blood Sugar _3 times a week____ times a day  Blood Thinner Instructions:stop 5 days prior to surgery Aspirin Instructions:Kim Millsaps Last Dose:friday 06/21/19 reported by pt.  Anesthesia review:   Patient denies shortness of breath, fever, cough and chest pain at PAT appointment yes  Patient verbalized understanding of instructions that were given to them at the PAT appointment. Patient was also instructed that they will need to review over the PAT instructions again at home before surgery. yes

## 2019-06-26 LAB — SARS CORONAVIRUS 2 (TAT 6-24 HRS): SARS Coronavirus 2: NEGATIVE

## 2019-06-26 LAB — HEMOGLOBIN A1C
Hgb A1c MFr Bld: 5.8 % — ABNORMAL HIGH (ref 4.8–5.6)
Mean Plasma Glucose: 120 mg/dL

## 2019-06-26 LAB — ABO/RH: ABO/RH(D): O POS

## 2019-06-27 ENCOUNTER — Inpatient Hospital Stay (HOSPITAL_COMMUNITY)
Admission: RE | Admit: 2019-06-27 | Discharge: 2019-06-28 | DRG: 470 | Disposition: A | Discharge status: Home / Self Care | Payer: 59 | Attending: Orthopedic Surgery | Admitting: Orthopedic Surgery

## 2019-06-27 ENCOUNTER — Inpatient Hospital Stay (HOSPITAL_COMMUNITY): Payer: 59 | Admitting: Physician Assistant

## 2019-06-27 ENCOUNTER — Encounter (HOSPITAL_COMMUNITY)
Admission: RE | Disposition: A | Discharge status: Home / Self Care | Payer: Self-pay | Source: Home / Self Care | Attending: Orthopedic Surgery

## 2019-06-27 ENCOUNTER — Inpatient Hospital Stay (HOSPITAL_COMMUNITY): Payer: 59 | Admitting: Registered Nurse

## 2019-06-27 ENCOUNTER — Encounter (HOSPITAL_COMMUNITY): Payer: Self-pay

## 2019-06-27 ENCOUNTER — Other Ambulatory Visit: Payer: Self-pay

## 2019-06-27 ENCOUNTER — Inpatient Hospital Stay (HOSPITAL_COMMUNITY): Payer: 59

## 2019-06-27 DIAGNOSIS — I10 Essential (primary) hypertension: Secondary | ICD-10-CM | POA: Diagnosis present

## 2019-06-27 DIAGNOSIS — Z6833 Body mass index (BMI) 33.0-33.9, adult: Secondary | ICD-10-CM | POA: Diagnosis not present

## 2019-06-27 DIAGNOSIS — Z20828 Contact with and (suspected) exposure to other viral communicable diseases: Secondary | ICD-10-CM | POA: Diagnosis present

## 2019-06-27 DIAGNOSIS — E669 Obesity, unspecified: Secondary | ICD-10-CM | POA: Diagnosis present

## 2019-06-27 DIAGNOSIS — M25762 Osteophyte, left knee: Secondary | ICD-10-CM | POA: Diagnosis present

## 2019-06-27 DIAGNOSIS — Z96652 Presence of left artificial knee joint: Secondary | ICD-10-CM

## 2019-06-27 DIAGNOSIS — E119 Type 2 diabetes mellitus without complications: Secondary | ICD-10-CM | POA: Diagnosis present

## 2019-06-27 DIAGNOSIS — M1712 Unilateral primary osteoarthritis, left knee: Principal | ICD-10-CM | POA: Diagnosis present

## 2019-06-27 HISTORY — PX: KNEE ARTHROPLASTY: SHX992

## 2019-06-27 LAB — GLUCOSE, CAPILLARY
Glucose-Capillary: 129 mg/dL — ABNORMAL HIGH (ref 70–99)
Glucose-Capillary: 152 mg/dL — ABNORMAL HIGH (ref 70–99)
Glucose-Capillary: 170 mg/dL — ABNORMAL HIGH (ref 70–99)
Glucose-Capillary: 182 mg/dL — ABNORMAL HIGH (ref 70–99)

## 2019-06-27 LAB — TYPE AND SCREEN
ABO/RH(D): O POS
Antibody Screen: NEGATIVE

## 2019-06-27 SURGERY — ARTHROPLASTY, KNEE, TOTAL, USING IMAGELESS COMPUTER-ASSISTED NAVIGATION
Anesthesia: Monitor Anesthesia Care | Site: Knee | Laterality: Left

## 2019-06-27 MED ORDER — DEXAMETHASONE SODIUM PHOSPHATE 10 MG/ML IJ SOLN
INTRAMUSCULAR | Status: AC
  Filled 2019-06-27: qty 1

## 2019-06-27 MED ORDER — GABAPENTIN 300 MG PO CAPS
300.0000 mg | ORAL_CAPSULE | Freq: Every day | ORAL | Status: DC
  Administered 2019-06-27: 300 mg via ORAL
  Filled 2019-06-27: qty 1

## 2019-06-27 MED ORDER — SODIUM CHLORIDE 0.9 % IV SOLN
INTRAVENOUS | Status: DC | PRN
  Administered 2019-06-27: 12:00:00 20 ug/min via INTRAVENOUS

## 2019-06-27 MED ORDER — INFLUENZA VAC SPLIT QUAD 0.5 ML IM SUSY: 0.5000 mL | PREFILLED_SYRINGE | INTRAMUSCULAR | Status: DC

## 2019-06-27 MED ORDER — METHOCARBAMOL 500 MG IVPB - SIMPLE MED
500.0000 mg | Freq: Four times a day (QID) | INTRAVENOUS | Status: DC | PRN
  Filled 2019-06-27: qty 50

## 2019-06-27 MED ORDER — ONDANSETRON HCL 4 MG/2ML IJ SOLN: 4.0000 mg | Freq: Four times a day (QID) | INTRAMUSCULAR | Status: DC | PRN

## 2019-06-27 MED ORDER — PROPOFOL 10 MG/ML IV BOLUS
INTRAVENOUS | Status: DC | PRN
  Administered 2019-06-27: 20 mg via INTRAVENOUS

## 2019-06-27 MED ORDER — METFORMIN HCL 500 MG PO TABS
1000.0000 mg | ORAL_TABLET | Freq: Two times a day (BID) | ORAL | Status: DC
  Administered 2019-06-27 – 2019-06-28 (×2): 1000 mg via ORAL
  Filled 2019-06-27 (×2): qty 2

## 2019-06-27 MED ORDER — SODIUM CHLORIDE 0.9 % IR SOLN
Status: DC | PRN
  Administered 2019-06-27: 1000 mL

## 2019-06-27 MED ORDER — BUPIVACAINE-EPINEPHRINE (PF) 0.5% -1:200000 IJ SOLN
INTRAMUSCULAR | Status: AC
  Filled 2019-06-27: qty 30

## 2019-06-27 MED ORDER — ISOPROPYL ALCOHOL 70 % SOLN
Status: DC | PRN
  Administered 2019-06-27: 1 via TOPICAL

## 2019-06-27 MED ORDER — ACETAMINOPHEN 325 MG PO TABS: 325.0000 mg | ORAL_TABLET | Freq: Four times a day (QID) | ORAL | Status: DC | PRN

## 2019-06-27 MED ORDER — PROPOFOL 10 MG/ML IV BOLUS
INTRAVENOUS | Status: AC
  Filled 2019-06-27: qty 40

## 2019-06-27 MED ORDER — ACETAMINOPHEN 10 MG/ML IV SOLN
1000.0000 mg | INTRAVENOUS | Status: AC
  Administered 2019-06-27: 1000 mg via INTRAVENOUS
  Filled 2019-06-27: qty 100

## 2019-06-27 MED ORDER — FENTANYL CITRATE (PF) 100 MCG/2ML IJ SOLN
INTRAMUSCULAR | Status: DC | PRN
  Administered 2019-06-27 (×2): 50 ug via INTRAVENOUS

## 2019-06-27 MED ORDER — PHENOL 1.4 % MT LIQD
1.0000 | OROMUCOSAL | Status: DC | PRN
  Filled 2019-06-27: qty 177

## 2019-06-27 MED ORDER — CEFAZOLIN SODIUM-DEXTROSE 2-4 GM/100ML-% IV SOLN
2.0000 g | Freq: Four times a day (QID) | INTRAVENOUS | Status: AC
  Administered 2019-06-27 – 2019-06-28 (×2): 2 g via INTRAVENOUS
  Filled 2019-06-27 (×2): qty 100

## 2019-06-27 MED ORDER — LACTATED RINGERS IV SOLN
INTRAVENOUS | Status: DC
  Administered 2019-06-27 (×2): via INTRAVENOUS

## 2019-06-27 MED ORDER — SODIUM CHLORIDE 0.9 % IV SOLN: INTRAVENOUS | Status: DC

## 2019-06-27 MED ORDER — PHENYLEPHRINE 40 MCG/ML (10ML) SYRINGE FOR IV PUSH (FOR BLOOD PRESSURE SUPPORT)
PREFILLED_SYRINGE | INTRAVENOUS | Status: DC | PRN
  Administered 2019-06-27 (×5): 80 ug via INTRAVENOUS

## 2019-06-27 MED ORDER — MIDAZOLAM HCL 2 MG/2ML IJ SOLN
1.0000 mg | INTRAMUSCULAR | Status: DC
  Administered 2019-06-27: 10:00:00 1 mg via INTRAVENOUS
  Filled 2019-06-27: qty 2

## 2019-06-27 MED ORDER — STERILE WATER FOR IRRIGATION IR SOLN
Status: DC | PRN
  Administered 2019-06-27 (×2): 1000 mL

## 2019-06-27 MED ORDER — MIDAZOLAM HCL 5 MG/5ML IJ SOLN
INTRAMUSCULAR | Status: DC | PRN
  Administered 2019-06-27 (×2): 1 mg via INTRAVENOUS

## 2019-06-27 MED ORDER — POLYETHYLENE GLYCOL 3350 17 G PO PACK: 17.0000 g | PACK | Freq: Every day | ORAL | Status: DC | PRN

## 2019-06-27 MED ORDER — KETOROLAC TROMETHAMINE 30 MG/ML IJ SOLN
INTRAMUSCULAR | Status: AC
  Filled 2019-06-27: qty 1

## 2019-06-27 MED ORDER — LACTATED RINGERS IV SOLN: INTRAVENOUS | Status: DC

## 2019-06-27 MED ORDER — LISINOPRIL-HYDROCHLOROTHIAZIDE 20-25 MG PO TABS: 1.0000 | ORAL_TABLET | Freq: Two times a day (BID) | ORAL | Status: DC

## 2019-06-27 MED ORDER — PHENYLEPHRINE 40 MCG/ML (10ML) SYRINGE FOR IV PUSH (FOR BLOOD PRESSURE SUPPORT)
PREFILLED_SYRINGE | INTRAVENOUS | Status: AC
  Filled 2019-06-27: qty 10

## 2019-06-27 MED ORDER — BUPIVACAINE-EPINEPHRINE 0.5% -1:200000 IJ SOLN
INTRAMUSCULAR | Status: DC | PRN
  Administered 2019-06-27: 30 mL

## 2019-06-27 MED ORDER — ONDANSETRON HCL 4 MG PO TABS: 4.0000 mg | ORAL_TABLET | Freq: Four times a day (QID) | ORAL | Status: DC | PRN

## 2019-06-27 MED ORDER — ACETAMINOPHEN 10 MG/ML IV SOLN: 1000.0000 mg | Freq: Once | INTRAVENOUS | Status: DC | PRN

## 2019-06-27 MED ORDER — PHENYLEPHRINE HCL (PRESSORS) 10 MG/ML IV SOLN
INTRAVENOUS | Status: AC
  Filled 2019-06-27: qty 1

## 2019-06-27 MED ORDER — POVIDONE-IODINE 10 % EX SWAB
2.0000 "application " | Freq: Once | CUTANEOUS | Status: AC
  Administered 2019-06-27: 2 via TOPICAL

## 2019-06-27 MED ORDER — METOCLOPRAMIDE HCL 5 MG/ML IJ SOLN: 5.0000 mg | Freq: Three times a day (TID) | INTRAMUSCULAR | Status: DC | PRN

## 2019-06-27 MED ORDER — ROPIVACAINE HCL 7.5 MG/ML IJ SOLN
INTRAMUSCULAR | Status: DC | PRN
  Administered 2019-06-27: 20 mL via PERINEURAL

## 2019-06-27 MED ORDER — FENTANYL CITRATE (PF) 100 MCG/2ML IJ SOLN: 25.0000 ug | INTRAMUSCULAR | Status: DC | PRN

## 2019-06-27 MED ORDER — HYDROCODONE-ACETAMINOPHEN 7.5-325 MG PO TABS: 1.0000 | ORAL_TABLET | ORAL | Status: DC | PRN

## 2019-06-27 MED ORDER — OXYCODONE HCL 5 MG/5ML PO SOLN: 5.0000 mg | Freq: Once | ORAL | Status: DC | PRN

## 2019-06-27 MED ORDER — NIACIN ER (ANTIHYPERLIPIDEMIC) 500 MG PO TBCR
1000.0000 mg | EXTENDED_RELEASE_TABLET | Freq: Every day | ORAL | Status: DC
  Administered 2019-06-27: 20:00:00 1000 mg via ORAL
  Filled 2019-06-27 (×2): qty 2

## 2019-06-27 MED ORDER — ONDANSETRON HCL 4 MG/2ML IJ SOLN
INTRAMUSCULAR | Status: DC | PRN
  Administered 2019-06-27: 4 mg via INTRAVENOUS

## 2019-06-27 MED ORDER — INSULIN ASPART 100 UNIT/ML ~~LOC~~ SOLN: 0.0000 [IU] | Freq: Every day | SUBCUTANEOUS | Status: DC

## 2019-06-27 MED ORDER — ACETAMINOPHEN 500 MG PO TABS: 1000.0000 mg | ORAL_TABLET | Freq: Once | ORAL | Status: DC | PRN

## 2019-06-27 MED ORDER — SODIUM CHLORIDE 0.9 % IV SOLN
INTRAVENOUS | Status: DC
  Administered 2019-06-28: via INTRAVENOUS

## 2019-06-27 MED ORDER — DEXAMETHASONE SODIUM PHOSPHATE 10 MG/ML IJ SOLN
INTRAMUSCULAR | Status: DC | PRN
  Administered 2019-06-27: 5 mg via INTRAVENOUS

## 2019-06-27 MED ORDER — MIDAZOLAM HCL 2 MG/2ML IJ SOLN
INTRAMUSCULAR | Status: AC
  Filled 2019-06-27: qty 2

## 2019-06-27 MED ORDER — INSULIN ASPART 100 UNIT/ML ~~LOC~~ SOLN
0.0000 [IU] | Freq: Three times a day (TID) | SUBCUTANEOUS | Status: DC
  Administered 2019-06-28: 09:00:00 1 [IU] via SUBCUTANEOUS

## 2019-06-27 MED ORDER — CEFAZOLIN SODIUM-DEXTROSE 2-4 GM/100ML-% IV SOLN
2.0000 g | INTRAVENOUS | Status: AC
  Administered 2019-06-27: 2 g via INTRAVENOUS
  Filled 2019-06-27: qty 100

## 2019-06-27 MED ORDER — PROPOFOL 10 MG/ML IV BOLUS
INTRAVENOUS | Status: AC
  Filled 2019-06-27: qty 20

## 2019-06-27 MED ORDER — ALUM & MAG HYDROXIDE-SIMETH 200-200-20 MG/5ML PO SUSP: 30.0000 mL | ORAL | Status: DC | PRN

## 2019-06-27 MED ORDER — METHOCARBAMOL 500 MG PO TABS
500.0000 mg | ORAL_TABLET | Freq: Four times a day (QID) | ORAL | Status: DC | PRN
  Administered 2019-06-27: 20:00:00 500 mg via ORAL
  Filled 2019-06-27: qty 1

## 2019-06-27 MED ORDER — ATENOLOL 50 MG PO TABS
50.0000 mg | ORAL_TABLET | Freq: Two times a day (BID) | ORAL | Status: DC
  Administered 2019-06-27 – 2019-06-28 (×2): 50 mg via ORAL
  Filled 2019-06-27 (×2): qty 1

## 2019-06-27 MED ORDER — PROPOFOL 500 MG/50ML IV EMUL
INTRAVENOUS | Status: DC | PRN
  Administered 2019-06-27: 50 ug/kg/min via INTRAVENOUS

## 2019-06-27 MED ORDER — SENNA 8.6 MG PO TABS
1.0000 | ORAL_TABLET | Freq: Two times a day (BID) | ORAL | Status: DC
  Administered 2019-06-27 – 2019-06-28 (×2): 8.6 mg via ORAL
  Filled 2019-06-27 (×2): qty 1

## 2019-06-27 MED ORDER — PRAVASTATIN SODIUM 20 MG PO TABS
40.0000 mg | ORAL_TABLET | Freq: Every day | ORAL | Status: DC
  Administered 2019-06-27: 17:00:00 40 mg via ORAL
  Filled 2019-06-27: qty 2

## 2019-06-27 MED ORDER — DEXAMETHASONE SODIUM PHOSPHATE 10 MG/ML IJ SOLN
10.0000 mg | Freq: Once | INTRAMUSCULAR | Status: AC
  Administered 2019-06-28: 09:00:00 10 mg via INTRAVENOUS
  Filled 2019-06-27: qty 1

## 2019-06-27 MED ORDER — MORPHINE SULFATE (PF) 2 MG/ML IV SOLN
0.5000 mg | INTRAVENOUS | Status: DC | PRN
  Administered 2019-06-28: 0.5 mg via INTRAVENOUS
  Filled 2019-06-27: qty 1

## 2019-06-27 MED ORDER — SODIUM CHLORIDE (PF) 0.9 % IJ SOLN
INTRAMUSCULAR | Status: DC | PRN
  Administered 2019-06-27: 30 mL

## 2019-06-27 MED ORDER — FENOFIBRATE 160 MG PO TABS
160.0000 mg | ORAL_TABLET | Freq: Every day | ORAL | Status: DC
  Administered 2019-06-28: 160 mg via ORAL
  Filled 2019-06-27: qty 1

## 2019-06-27 MED ORDER — HYDROCHLOROTHIAZIDE 25 MG PO TABS
25.0000 mg | ORAL_TABLET | Freq: Two times a day (BID) | ORAL | Status: DC
  Administered 2019-06-27 – 2019-06-28 (×2): 25 mg via ORAL
  Filled 2019-06-27 (×2): qty 1

## 2019-06-27 MED ORDER — SODIUM CHLORIDE 0.9% IV SOLUTION
INTRAVENOUS | Status: AC | PRN
  Administered 2019-06-27: 1000 mL

## 2019-06-27 MED ORDER — TRANEXAMIC ACID-NACL 1000-0.7 MG/100ML-% IV SOLN
1000.0000 mg | INTRAVENOUS | Status: AC
  Administered 2019-06-27: 1000 mg via INTRAVENOUS
  Filled 2019-06-27: qty 100

## 2019-06-27 MED ORDER — METOCLOPRAMIDE HCL 5 MG PO TABS: 5.0000 mg | ORAL_TABLET | Freq: Three times a day (TID) | ORAL | Status: DC | PRN

## 2019-06-27 MED ORDER — OXYCODONE HCL 5 MG PO TABS: 5.0000 mg | ORAL_TABLET | Freq: Once | ORAL | Status: DC | PRN

## 2019-06-27 MED ORDER — FENTANYL CITRATE (PF) 100 MCG/2ML IJ SOLN
50.0000 ug | INTRAMUSCULAR | Status: DC
  Administered 2019-06-27: 50 ug via INTRAVENOUS
  Filled 2019-06-27: qty 2

## 2019-06-27 MED ORDER — BUPIVACAINE IN DEXTROSE 0.75-8.25 % IT SOLN
INTRATHECAL | Status: DC | PRN
  Administered 2019-06-27: 2 mL via INTRATHECAL

## 2019-06-27 MED ORDER — LIDOCAINE 2% (20 MG/ML) 5 ML SYRINGE
INTRAMUSCULAR | Status: AC
  Filled 2019-06-27: qty 5

## 2019-06-27 MED ORDER — SODIUM CHLORIDE (PF) 0.9 % IJ SOLN
INTRAMUSCULAR | Status: AC
  Filled 2019-06-27: qty 50

## 2019-06-27 MED ORDER — LIDOCAINE 2% (20 MG/ML) 5 ML SYRINGE
INTRAMUSCULAR | Status: DC | PRN
  Administered 2019-06-27: 80 mg via INTRAVENOUS

## 2019-06-27 MED ORDER — LISINOPRIL 20 MG PO TABS
20.0000 mg | ORAL_TABLET | Freq: Two times a day (BID) | ORAL | Status: DC
  Administered 2019-06-27 – 2019-06-28 (×2): 20 mg via ORAL
  Filled 2019-06-27 (×2): qty 1

## 2019-06-27 MED ORDER — AMLODIPINE BESYLATE 10 MG PO TABS
10.0000 mg | ORAL_TABLET | Freq: Every day | ORAL | Status: DC
  Administered 2019-06-28: 09:00:00 10 mg via ORAL
  Filled 2019-06-27: qty 1

## 2019-06-27 MED ORDER — DOCUSATE SODIUM 100 MG PO CAPS
100.0000 mg | ORAL_CAPSULE | Freq: Two times a day (BID) | ORAL | Status: DC
  Administered 2019-06-27 – 2019-06-28 (×2): 100 mg via ORAL
  Filled 2019-06-27 (×2): qty 1

## 2019-06-27 MED ORDER — ACETAMINOPHEN 160 MG/5ML PO SOLN: 1000.0000 mg | Freq: Once | ORAL | Status: DC | PRN

## 2019-06-27 MED ORDER — POVIDONE-IODINE 10 % EX SWAB: 2.0000 "application " | Freq: Once | CUTANEOUS | Status: DC

## 2019-06-27 MED ORDER — CHLORHEXIDINE GLUCONATE 4 % EX LIQD: 60.0000 mL | Freq: Once | CUTANEOUS | Status: DC

## 2019-06-27 MED ORDER — KETOROLAC TROMETHAMINE 30 MG/ML IJ SOLN
INTRAMUSCULAR | Status: DC | PRN
  Administered 2019-06-27: 30 mg via INTRAVENOUS

## 2019-06-27 MED ORDER — ONDANSETRON HCL 4 MG/2ML IJ SOLN
INTRAMUSCULAR | Status: AC
  Filled 2019-06-27: qty 2

## 2019-06-27 MED ORDER — ASPIRIN 81 MG PO CHEW
81.0000 mg | CHEWABLE_TABLET | Freq: Two times a day (BID) | ORAL | Status: DC
  Administered 2019-06-27 – 2019-06-28 (×2): 81 mg via ORAL
  Filled 2019-06-27 (×2): qty 1

## 2019-06-27 MED ORDER — MENTHOL 3 MG MT LOZG: 1.0000 | LOZENGE | OROMUCOSAL | Status: DC | PRN

## 2019-06-27 MED ORDER — DIPHENHYDRAMINE HCL 12.5 MG/5ML PO ELIX: 12.5000 mg | ORAL_SOLUTION | ORAL | Status: DC | PRN

## 2019-06-27 MED ORDER — FENTANYL CITRATE (PF) 100 MCG/2ML IJ SOLN
INTRAMUSCULAR | Status: AC
  Filled 2019-06-27: qty 2

## 2019-06-27 MED ORDER — ALLOPURINOL 300 MG PO TABS
300.0000 mg | ORAL_TABLET | Freq: Every day | ORAL | Status: DC
  Administered 2019-06-28: 300 mg via ORAL
  Filled 2019-06-27: qty 1

## 2019-06-27 MED ORDER — HYDROCODONE-ACETAMINOPHEN 5-325 MG PO TABS
1.0000 | ORAL_TABLET | ORAL | Status: DC | PRN
  Administered 2019-06-27 (×2): 1 via ORAL
  Administered 2019-06-28: 2 via ORAL
  Filled 2019-06-27: qty 2
  Filled 2019-06-27 (×2): qty 1

## 2019-06-27 SURGICAL SUPPLY — 75 items
ADH SKN CLS APL DERMABOND .7 (GAUZE/BANDAGES/DRESSINGS) ×2
APL PRP STRL LF DISP 70% ISPRP (MISCELLANEOUS) ×2
BAG SPEC THK2 15X12 ZIP CLS (MISCELLANEOUS)
BAG ZIPLOCK 12X15 (MISCELLANEOUS) IMPLANT
BATTERY INSTRU NAVIGATION (MISCELLANEOUS) ×9 IMPLANT
BLADE SAW RECIPROCATING 77.5 (BLADE) ×3 IMPLANT
BNDG ELASTIC 4X5.8 VLCR STR LF (GAUZE/BANDAGES/DRESSINGS) ×3 IMPLANT
BNDG ELASTIC 6X5.8 VLCR STR LF (GAUZE/BANDAGES/DRESSINGS) ×3 IMPLANT
BSPLAT TIB 6 KN TRITANIUM (Knees) ×1 IMPLANT
BTRY SRG DRVR LF (MISCELLANEOUS) ×3
CHLORAPREP W/TINT 26 (MISCELLANEOUS) ×6 IMPLANT
COMP FEM SZ5 CRUC LEFT RETAIN (Orthopedic Implant) ×3 IMPLANT
COMPONENT FEM SZ5 CRU LT RETN (Orthopedic Implant) IMPLANT
COVER SURGICAL LIGHT HANDLE (MISCELLANEOUS) ×3 IMPLANT
COVER WAND RF STERILE (DRAPES) IMPLANT
CUFF TOURN SGL QUICK 34 (TOURNIQUET CUFF) ×3
CUFF TRNQT CYL 34X4.125X (TOURNIQUET CUFF) ×1 IMPLANT
DECANTER SPIKE VIAL GLASS SM (MISCELLANEOUS) ×6 IMPLANT
DERMABOND ADVANCED (GAUZE/BANDAGES/DRESSINGS) ×4
DERMABOND ADVANCED .7 DNX12 (GAUZE/BANDAGES/DRESSINGS) ×2 IMPLANT
DRAPE SHEET LG 3/4 BI-LAMINATE (DRAPES) ×9 IMPLANT
DRAPE U-SHAPE 47X51 STRL (DRAPES) ×3 IMPLANT
DRESSING AQUACEL AG SP 3.5X10 (GAUZE/BANDAGES/DRESSINGS) IMPLANT
DRSG AQUACEL AG ADV 3.5X10 (GAUZE/BANDAGES/DRESSINGS) ×3 IMPLANT
DRSG AQUACEL AG SP 3.5X10 (GAUZE/BANDAGES/DRESSINGS) ×3
DRSG TEGADERM 4X4.75 (GAUZE/BANDAGES/DRESSINGS) IMPLANT
ELECT BLADE TIP CTD 4 INCH (ELECTRODE) ×3 IMPLANT
ELECT REM PT RETURN 15FT ADLT (MISCELLANEOUS) ×3 IMPLANT
EVACUATOR 1/8 PVC DRAIN (DRAIN) IMPLANT
GAUZE SPONGE 4X4 12PLY STRL (GAUZE/BANDAGES/DRESSINGS) ×3 IMPLANT
GLOVE BIO SURGEON STRL SZ8.5 (GLOVE) ×6 IMPLANT
GLOVE BIOGEL PI IND STRL 8.5 (GLOVE) ×1 IMPLANT
GLOVE BIOGEL PI INDICATOR 8.5 (GLOVE) ×2
GOWN SPEC L3 XXLG W/TWL (GOWN DISPOSABLE) ×3 IMPLANT
HANDPIECE INTERPULSE COAX TIP (DISPOSABLE) ×3
HOLDER FOLEY CATH W/STRAP (MISCELLANEOUS) ×3 IMPLANT
HOOD PEEL AWAY FLYTE STAYCOOL (MISCELLANEOUS) ×9 IMPLANT
INSERT KNEE TIB BRG SZ 6 (Insert) ×2 IMPLANT
JET LAVAGE IRRISEPT WOUND (IRRIGATION / IRRIGATOR) ×3
KIT TURNOVER KIT A (KITS) IMPLANT
KNEE TIBIAL COMPONENT SZ6 (Knees) ×2 IMPLANT
LAVAGE JET IRRISEPT WOUND (IRRIGATION / IRRIGATOR) ×1 IMPLANT
MARKER SKIN DUAL TIP RULER LAB (MISCELLANEOUS) ×3 IMPLANT
NDL SAFETY ECLIPSE 18X1.5 (NEEDLE) ×1 IMPLANT
NDL SPNL 18GX3.5 QUINCKE PK (NEEDLE) ×1 IMPLANT
NEEDLE HYPO 18GX1.5 SHARP (NEEDLE) ×3
NEEDLE SPNL 18GX3.5 QUINCKE PK (NEEDLE) ×3 IMPLANT
NS IRRIG 1000ML POUR BTL (IV SOLUTION) ×3 IMPLANT
PACK TOTAL KNEE CUSTOM (KITS) ×3 IMPLANT
PADDING CAST COTTON 6X4 STRL (CAST SUPPLIES) ×3 IMPLANT
PATELLA ASYMMETRIC 38X11 KNEE (Orthopedic Implant) ×2 IMPLANT
PIN FLUTED HEDLESS FIX 3.5X1/8 (PIN) ×4 IMPLANT
PROTECTOR NERVE ULNAR (MISCELLANEOUS) ×3 IMPLANT
SAW OSC TIP CART 19.5X105X1.3 (SAW) ×3 IMPLANT
SEALER BIPOLAR AQUA 6.0 (INSTRUMENTS) ×3 IMPLANT
SET HNDPC FAN SPRY TIP SCT (DISPOSABLE) ×1 IMPLANT
SET PAD KNEE POSITIONER (MISCELLANEOUS) ×3 IMPLANT
SPONGE DRAIN TRACH 4X4 STRL 2S (GAUZE/BANDAGES/DRESSINGS) IMPLANT
SPONGE LAP 18X18 RF (DISPOSABLE) IMPLANT
SUT MNCRL AB 3-0 PS2 18 (SUTURE) ×3 IMPLANT
SUT MNCRL AB 4-0 PS2 18 (SUTURE) ×3 IMPLANT
SUT MON AB 2-0 CT1 36 (SUTURE) ×6 IMPLANT
SUT STRATAFIX PDO 1 14 VIOLET (SUTURE) ×3
SUT STRATFX PDO 1 14 VIOLET (SUTURE) ×1
SUT VIC AB 1 CTX 36 (SUTURE) ×6
SUT VIC AB 1 CTX36XBRD ANBCTR (SUTURE) ×2 IMPLANT
SUT VIC AB 2-0 CT1 27 (SUTURE) ×3
SUT VIC AB 2-0 CT1 TAPERPNT 27 (SUTURE) ×1 IMPLANT
SUTURE STRATFX PDO 1 14 VIOLET (SUTURE) ×1 IMPLANT
SYR 3ML LL SCALE MARK (SYRINGE) ×3 IMPLANT
TOWER CARTRIDGE SMART MIX (DISPOSABLE) IMPLANT
TRAY FOLEY MTR SLVR 16FR STAT (SET/KITS/TRAYS/PACK) IMPLANT
WATER STERILE IRR 1000ML POUR (IV SOLUTION) ×6 IMPLANT
WRAP KNEE MAXI GEL POST OP (GAUZE/BANDAGES/DRESSINGS) ×3 IMPLANT
YANKAUER SUCT BULB TIP 10FT TU (MISCELLANEOUS) ×3 IMPLANT

## 2019-06-27 NOTE — Anesthesia Preprocedure Evaluation (Signed)
Anesthesia Evaluation  Patient identified by MRN, date of birth, ID band Patient awake    Reviewed: Allergy & Precautions, NPO status , Patient's Chart, lab work & pertinent test results  History of Anesthesia Complications Negative for: history of anesthetic complications  Airway Mallampati: II  TM Distance: >3 FB Neck ROM: Full    Dental  (+) Dental Advisory Given   Pulmonary neg pulmonary ROS,    breath sounds clear to auscultation       Cardiovascular hypertension, Pt. on medications  Rhythm:Regular     Neuro/Psych negative neurological ROS  negative psych ROS   GI/Hepatic negative GI ROS, Neg liver ROS,   Endo/Other  diabetesMorbid obesity  Renal/GU CRFRenal disease     Musculoskeletal  (+) Arthritis ,   Abdominal   Peds  Hematology   Anesthesia Other Findings INR 1 Plt 380 Cr 1.8  Reproductive/Obstetrics                             Anesthesia Physical Anesthesia Plan  ASA: III  Anesthesia Plan: MAC, Regional and Spinal   Post-op Pain Management:    Induction: Intravenous  PONV Risk Score and Plan: 1 and Propofol infusion and Treatment may vary due to age or medical condition  Airway Management Planned: Nasal Cannula  Additional Equipment: None  Intra-op Plan:   Post-operative Plan:   Informed Consent: I have reviewed the patients History and Physical, chart, labs and discussed the procedure including the risks, benefits and alternatives for the proposed anesthesia with the patient or authorized representative who has indicated his/her understanding and acceptance.     Dental advisory given  Plan Discussed with: CRNA and Surgeon  Anesthesia Plan Comments:         Anesthesia Quick Evaluation

## 2019-06-27 NOTE — Anesthesia Procedure Notes (Signed)
Anesthesia Regional Block: Adductor canal block   Pre-Anesthetic Checklist: ,, timeout performed, Correct Patient, Correct Site, Correct Laterality, Correct Procedure, Correct Position, site marked, Risks and benefits discussed,  Surgical consent,  Pre-op evaluation,  At surgeon's request and post-op pain management  Laterality: Left and Lower  Prep: chloraprep       Needles:  Injection technique: Single-shot     Needle Length: 9cm  Needle Gauge: 22     Additional Needles: Arrow StimuQuik ECHO Echogenic Stimulating PNB Needle  Procedures:,,,, ultrasound used (permanent image in chart),,,,  Narrative:  Start time: 06/27/2019 8:49 AM End time: 06/27/2019 8:59 AM Injection made incrementally with aspirations every 5 mL.  Performed by: Personally  Anesthesiologist: Oleta Mouse, MD

## 2019-06-27 NOTE — Transfer of Care (Signed)
Immediate Anesthesia Transfer of Care Note  Patient: Bryan Atkinson  Procedure(s) Performed: COMPUTER ASSISTED TOTAL KNEE ARTHROPLASTY (Left Knee)  Patient Location: PACU  Anesthesia Type:Spinal  Level of Consciousness: awake, alert  and oriented  Airway & Oxygen Therapy: Patient Spontanous Breathing and Patient connected to face mask oxygen  Post-op Assessment: Report given to RN and Post -op Vital signs reviewed and stable  Post vital signs: Reviewed and stable  Last Vitals:  Vitals Value Taken Time  BP    Temp    Pulse 78 06/27/19 1354  Resp 18 06/27/19 1354  SpO2 99 % 06/27/19 1354  Vitals shown include unvalidated device data.  Last Pain:  Vitals:   06/27/19 1000  TempSrc:   PainSc: 0-No pain         Complications: No apparent anesthesia complications

## 2019-06-27 NOTE — Anesthesia Procedure Notes (Signed)
Spinal  Patient location during procedure: OR Start time: 06/27/2019 10:57 AM End time: 06/27/2019 11:02 AM Staffing Anesthesiologist: Oleta Mouse, MD Resident/CRNA: Talbot Grumbling, CRNA Performed: resident/CRNA  Preanesthetic Checklist Completed: patient identified, surgical consent, pre-op evaluation, timeout performed, IV checked, risks and benefits discussed and monitors and equipment checked Spinal Block Patient position: sitting Prep: DuraPrep Patient monitoring: heart rate, cardiac monitor, continuous pulse ox and blood pressure Approach: midline Location: L3-4 Injection technique: single-shot Needle Needle type: Pencan  Needle gauge: 24 G Needle length: 9 cm Assessment Sensory level: T4 Additional Notes Clear CSF, no paresthesia, patient tolerated well.

## 2019-06-27 NOTE — Plan of Care (Signed)

## 2019-06-27 NOTE — Progress Notes (Signed)
Assisted Dr. Moser with left, ultrasound guided, adductor canal block. Side rails up, monitors on throughout procedure. See vital signs in flow sheet. Tolerated Procedure well.  

## 2019-06-27 NOTE — H&P (Signed)
TOTAL KNEE ADMISSION H&P  Patient is being admitted for left total knee arthroplasty.  Subjective:  Chief Complaint:left knee pain.  HPI: Bryan Atkinson, 63 y.o. male, has a history of pain and functional disability in the left knee due to arthritis and has failed non-surgical conservative treatments for greater than 12 weeks to includeNSAID's and/or analgesics, corticosteriod injections, flexibility and strengthening excercises, use of assistive devices, weight reduction as appropriate and activity modification.  Onset of symptoms was gradual, starting >10 years ago with rapidlly worsening course since that time. The patient noted no past surgery on the left knee(s).  Patient currently rates pain in the left knee(s) at 10 out of 10 with activity. Patient has night pain, worsening of pain with activity and weight bearing, pain that interferes with activities of daily living, pain with passive range of motion, crepitus and joint swelling.  Patient has evidence of subchondral cysts, subchondral sclerosis, periarticular osteophytes, joint subluxation and joint space narrowing by imaging studies. There is no active infection.  There are no active problems to display for this patient.  Past Medical History:  Diagnosis Date  . Arthritis   . Diabetes mellitus without complication ([place])   . Gout   . History of kidney stones   . Hypertension     Past Surgical History:  Procedure Laterality Date  . CATARACT EXTRACTION W/PHACO Right 05/13/2019   Procedure: CATARACT EXTRACTION PHACO AND INTRAOCULAR LENS PLACEMENT (IOC);  Surgeon: Baruch Goldmann, MD;  Location: [facility];  Service: Ophthalmology;  Laterality: Right;  CDE: 10.31  . CATARACT EXTRACTION W/PHACO Left 05/27/2019   Procedure: CATARACT EXTRACTION PHACO AND INTRAOCULAR LENS PLACEMENT (IOC);  Surgeon: Baruch Goldmann, MD;  Location: [facility];  Service: Ophthalmology;  Laterality: Left;  CDE: 6.49  . COLONOSCOPY     times two 2002 ,2007  .  COLONOSCOPY N/A 10/07/2013   Procedure: COLONOSCOPY;  Surgeon: Daneil Dolin, MD;  Location: [facility] END[facility] SUITE;  Service: Endoscopy;  Laterality: N/A;  8:30 AM  . COLONOSCOPY N/A 10/05/2018   Procedure: COLONOSCOPY;  Surgeon: Daneil Dolin, MD;  Location: [facility] SU[facility];  Service: Endoscopy;  Laterality: N/A;  8:30  . LEG SURGERY Right early 38's  . NO PAST SURGERIES      Current Facility-Administered Medications  Medication Dose Route Frequency Provider Last Rate Last Dose  . 0.9 %  sodium chloride infusion   Intravenous Continuous Dorrance Sellick, Aaron Edelman, MD      . acetaminophen (OFIRMEV) IV 1,000 mg  1,000 mg Intravenous To OR Marixa Mellott, Aaron Edelman, MD      . ceFAZolin (ANCEF) IVPB 2g/100 mL premix  2 g Intravenous On Call to [place], Aaron Edelman, MD      . chlorhexidine (HIBICLENS) 4 % liquid 4 application  60 mL Topical Once Shenouda Genova, Aaron Edelman, MD      . fentaNYL (SUBLIMAZE) injection 50-100 mcg  50-100 mcg Intravenous Reola Mosher, MD      . lactated ringers infusion   Intravenous Continuous Oleta Mouse, MD 10 mL/hr at 06/27/19 0840    . lactated ringers infusion   Intravenous Continuous Lenice Llamas, MD      . midazolam (VERSED) injection 1-2 mg  1-2 mg Intravenous Reola Mosher, MD      . povidone-iodine 10 % swab 2 application  2 application Topical Once Beatris Belen, Aaron Edelman, MD      . povidone-iodine 10 % swab 2 application  2 application Topical Once Rod Can, MD      . tranexamic  acid (CYKLOKAPRON) IVPB 1,000 mg  1,000 mg Intravenous To OR Lakisha Peyser, Aaron Edelman, MD       No Known Allergies  Social History   Tobacco Use  . Smoking status: Never Smoker  . Smokeless tobacco: Never Used  Substance Use Topics  . Alcohol use: No    Family History  Problem Relation Age of Onset  . Colon cancer Mother        She died from her malignancy, 16  . Lung cancer Father        Died from lung cancer, age 65  . Emphysema Brother        Died at 35 from lung disease     Review of  Systems  Constitutional: Negative.   HENT: Negative.   Eyes: Negative.   Respiratory: Negative.   Cardiovascular: Negative.   Genitourinary: Negative.   Musculoskeletal: Positive for back pain and joint pain.  Skin: Negative.   Neurological: Negative.   Endo/Heme/Allergies: Negative.   Psychiatric/Behavioral: Negative.     Objective:  Physical Exam  Vitals reviewed. Constitutional: He is oriented to person, place, and time. He appears well-developed and well-nourished.  HENT:  Head: Normocephalic and atraumatic.  Eyes: Pupils are equal, round, and reactive to light. Conjunctivae and EOM are normal.  Neck: Normal range of motion. Neck supple.  Cardiovascular: Normal rate, regular rhythm and intact distal pulses.  Respiratory: Effort normal. No respiratory distress.  GI: Soft. He exhibits no distension.  Genitourinary:    Genitourinary Comments: deferred   Musculoskeletal:     Left knee: He exhibits decreased range of motion, swelling and effusion. Tenderness found. Medial joint line and lateral joint line tenderness noted.  Neurological: He is alert and oriented to person, place, and time. He has normal reflexes.  Skin: Skin is warm and dry.  Psychiatric: He has a normal mood and affect. His behavior is normal. Judgment and thought content normal.    Vital signs in last 24 hours: Temp:  [98.1 F (36.7 C)] 98.1 F (36.7 C) (09/10 0800) Pulse Rate:  [78] 78 (09/10 0800) Resp:  [18] 18 (09/10 0800) BP: (155)/(100) 155/100 (09/10 0800) SpO2:  [99 %] 99 % (09/10 0800) Weight:  [101.6 kg] 101.6 kg (09/10 0737)  Labs:   Estimated body mass index is 33.08 kg/m as calculated from the following:   Height as of this encounter: 5\' 9"  (1.753 m).   Weight as of this encounter: 101.6 kg.   Imaging Review Plain radiographs demonstrate severe degenerative joint disease of the left knee(s). The overall alignment issignificant valgus. The bone quality appears to be adequate for age  and reported activity level.      Assessment/Plan:  End stage arthritis, left knee   The patient history, physical examination, clinical judgment of the provider and imaging studies are consistent with end stage degenerative joint disease of the left knee(s) and total knee arthroplasty is deemed medically necessary. The treatment options including medical management, injection therapy arthroscopy and arthroplasty were discussed at length. The risks and benefits of total knee arthroplasty were presented and reviewed. The risks due to aseptic loosening, infection, stiffness, patella tracking problems, thromboembolic complications and other imponderables were discussed. The patient acknowledged the explanation, agreed to proceed with the plan and consent was signed. Patient is being admitted for inpatient treatment for surgery, pain control, PT, OT, prophylactic antibiotics, VTE prophylaxis, progressive ambulation and ADL's and discharge planning. The patient is planning to be discharged home with OPPT    Anticipated LOS equal  to or greater than 2 midnights due to - Age 64 and older with one or more of the following:  - Obesity  - Expected need for hospital services (PT, OT, Nursing) required for safe  discharge  - Anticipated need for postoperative skilled nursing care or inpatient rehab  - Active co-morbidities: Diabetes OR   - Unanticipated findings during/Post Surgery: None  - Patient is a high risk of re-admission due to: None

## 2019-06-27 NOTE — Op Note (Signed)
OPERATIVE REPORT  SURGEON: Rod Can, MD   ASSISTANT: Nehemiah Massed, PA-C.  PREOPERATIVE DIAGNOSIS: Left knee arthritis.   POSTOPERATIVE DIAGNOSIS: Left knee arthritis.   PROCEDURE: Left total knee arthroplasty.   IMPLANTS: Stryker Triathlon CR femur, size 5. Stryker Tritanium tibia, size 6. X3 polyethelyene insert, size 9 mm, CR. 3 button asymmetric patella, size 38 mm.  ANESTHESIA:  MAC, [facility] and Spinal  TOURNIQUET TIME: Not utilized.   ESTIMATED BLOOD LOSS:-250 mL    ANTIBIOTICS: 2 g Ancef.  DRAINS: None.  COMPLICATIONS: None   CONDITION: PACU - hemodynamically stable.   BRIEF CLINICAL NOTE: Bryan Atkinson is a 63 y.o. male with a long-standing history of Left knee arthritis. After failing conservative management, the patient was indicated for total knee arthroplasty. The risks, benefits, and alternatives to the procedure were explained, and the patient elected to proceed.  PROCEDURE IN DETAIL: Adductor canal block was obtained in the pre-op holding area. Once inside the operative room, spinal anesthesia was obtained, and a foley catheter was inserted. The patient was then positioned, a nonsterile tourniquet was placed, and the lower extremity was prepped and draped in the normal sterile surgical fashion.  A time-out was called verifying side and site of surgery. The patient received IV antibiotics within 60 minutes of beginning the procedure. The tourniquet was not utilized.   An anterior approach to the knee was performed utilizing a midvastus arthrotomy. A medial release was performed and the patellar fat pad was excised. Stryker navigation was used to cut the distal femur perpendicular to the mechanical axis. A freehand patellar resection was performed, and the patella was sized an prepared with 3 lug holes.  Nagivation was used to make a neutral proximal  tibia resection, taking 6 mm of bone from the less affected medial side with 3 degrees of slope. The menisci were excised. A spacer block was placed, and the alignment and balance in extension were confirmed.   The distal femur was sized using the 3-degree external rotation guide referencing the posterior femoral cortex. The appropriate 4-in-1 cutting block was pinned into place. Rotation was checked using Whiteside's line, the epicondylar axis, and then confirmed with a spacer block in flexion. The remaining femoral cuts were performed, taking care to protect the MCL.  The tibia was sized and the trial tray was pinned into place. The remaining trail components were inserted. The knee was stable to varus and valgus stress through a full range of motion. The patella tracked centrally, and the PCL was well balanced. The trial components were removed, and the proximal tibial surface was prepared. Final components were impacted into place. The knee was tested for a final time and found to be well balanced.   The wound was copiously irrigated with Irrisept solution and normal saline using pule lavage.  Marcaine solution was injected into the periarticular soft tissue.  The wound was closed in layers using #1 Vicryl and Stratafix for the fascia, 2-0 Vicryl for the subcutaneous fat, 2-0 Monocryl for the deep dermal layer, 3-0 running Monocryl subcuticular Stitch, and 4-0 Monocryl stay sutures at both ends of the wound. Dermabond was applied to the skin.  Once the glue was fully dried, an Aquacell Ag and compressive dressing were applied.  Tthe patient was transported to the recovery room in stable condition.  Sponge, needle, and instrument counts were correct at the end of the case x2.  The patient tolerated the procedure well and there were no known complications.  Please note that  a surgical assistant was a medical necessity for this procedure in order to perform it in a safe and expeditious manner. Surgical  assistant was necessary to retract the ligaments and vital neurovascular structures to prevent injury to them and also necessary for proper positioning of the limb to allow for anatomic placement of the prosthesis.

## 2019-06-27 NOTE — Evaluation (Signed)
Physical Therapy Evaluation Patient Details Name: Bryan Atkinson MRN: [id number] DOB: 24-Aug-1956 Today's Date: 06/27/2019   History of Present Illness  63 yo male s/p L TKR on 06/27/19. PMH includes OA, DM, HTN.  Clinical Impression  Pt presents with L knee pain, decreased L knee ROM, increased time and effort to perform mobility tasks, and decreased activity tolerance post-operatively. Pt to benefit from acute PT to address deficits. Pt ambulated hallway distance with RW with min guard assist, verbal cuing for form and safety. Pt educated on ankle pumps (20/hour) to perform this afternoon/evening to increase circulation, to pt's tolerance and limited by pain. PT to progress mobility as tolerated, and will continue to follow acutely.        Follow Up Recommendations Follow surgeon's recommendation for DC plan and follow-up therapies;Supervision for mobility/OOB(OPPT)    Equipment Recommendations  Rolling walker with 5" wheels    Recommendations for Other Services       Precautions / Restrictions Precautions Precautions: Fall Restrictions Weight Bearing Restrictions: No Other Position/Activity Restrictions: WBAT      Mobility  Bed Mobility Overal bed mobility: Needs Assistance Bed Mobility: Supine to Sit     Supine to sit: Min guard;HOB elevated     General bed mobility comments: for safety, verbal cuing for sequencing.  Transfers Overall transfer level: Needs assistance Equipment used: Rolling walker (2 wheeled) Transfers: Sit to/from Stand Sit to Stand: Min guard;From elevated surface         General transfer comment: for safety, verbal cuing for hand placement when rising.  Ambulation/Gait Ambulation/Gait assistance: Min guard Gait Distance (Feet): 45 Feet Assistive device: Rolling walker (2 wheeled) Gait Pattern/deviations: Step-to pattern;Decreased step length - right;Decreased step length - left;Decreased weight shift to left;Decreased stance time -  left;Antalgic;Trunk flexed Gait velocity: decr   General Gait Details: Min guard for safety, verbal cuing for placement in RW x2, sequencing, turning with RW, upright posture.  Stairs            Wheelchair Mobility    Modified Rankin (Stroke Patients Only)       Balance Overall balance assessment: Mild deficits observed, not formally tested                                           Pertinent Vitals/Pain Pain Assessment: 0-10 Pain Score: 2  Pain Location: L knee Pain Descriptors / Indicators: Sore Pain Intervention(s): Limited activity within patient's tolerance;Monitored during session;Premedicated before session;Repositioned    Home Living Family/patient expects to be discharged to:: Private residence Living Arrangements: Spouse/significant other;Children(daughter) Available Help at Discharge: Family;Available 24 hours/day Type of Home: House Home Access: Stairs to enter Entrance Stairs-Rails: Left Entrance Stairs-Number of Steps: 2-3 Home Layout: One level Home Equipment: Cane - single point;Bedside commode      Prior Function Level of Independence: Independent         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     [place] Hand: Right    Extremity/Trunk Assessment   Upper Extremity Assessment Upper Extremity Assessment: Overall WFL for tasks assessed    Lower Extremity Assessment Lower Extremity Assessment: Overall WFL for tasks assessed;LLE deficits/detail LLE Deficits / Details: suspected post-surgical weakness; able to perform ankle pumps, quad set, heel slide to 90*, SLR without lift assist or quad lag LLE Sensation: WNL    Cervical / Trunk Assessment Cervical /  Trunk Assessment: Normal  Communication   Communication: No difficulties  Cognition Arousal/Alertness: Awake/alert Behavior During Therapy: WFL for tasks assessed/performed Overall Cognitive Status: Within Functional Limits for tasks assessed                                         General Comments      Exercises     Assessment/Plan    PT Assessment Patient needs continued PT services  PT Problem List Decreased strength;Decreased range of motion;Decreased activity tolerance;Decreased balance;Decreased knowledge of use of DME;Decreased mobility;Pain       PT Treatment Interventions Therapeutic activities;DME instruction;Gait training;Therapeutic exercise;Patient/family education;Stair training;Functional mobility training;Balance training    PT Goals (Current goals can be found in the Care Plan section)  Acute Rehab PT Goals Patient Stated Goal: d/c home tomorrow PT Goal Formulation: With patient Time For Goal Achievement: 07/04/19 Potential to Achieve Goals: Good    Frequency 7X/week   Barriers to discharge        Co-evaluation               AM-PAC PT "6 Clicks" Mobility  Outcome Measure Help needed turning from your back to your side while in a flat bed without using bedrails?: A Little Help needed moving from lying on your back to sitting on the side of a flat bed without using bedrails?: A Little Help needed moving to and from a bed to a chair (including a wheelchair)?: A Little Help needed standing up from a chair using your arms (e.g., wheelchair or bedside chair)?: A Little Help needed to walk in hospital room?: A Little Help needed climbing 3-5 steps with a railing? : A Little 6 Click Score: 18    End of Session Equipment Utilized During Treatment: Gait belt Activity Tolerance: Patient tolerated treatment well Patient left: in chair;with chair alarm set;with call bell/phone within reach(on SCD break for skin integrity) Nurse Communication: Mobility status PT Visit Diagnosis: Other abnormalities of gait and mobility (R26.89);Difficulty in walking, not elsewhere classified (R26.2)    Time: [id number] PT Time Calculation (min) (ACUTE ONLY): 19 min   Charges:   PT Evaluation $PT Eval  Low Complexity: 1 Low          Ngozi Alvidrez Conception Chancy, PT Acute Rehabilitation Services Pager [phone]  Office [phone]   Hank Walling D Elonda Husky 06/27/2019, 7:28 PM

## 2019-06-27 NOTE — Discharge Instructions (Signed)
° °Dr. Chirstina Haan °Total Joint Specialist °[facility] °[address] °[place], [place] [address] °[phone] ° °TOTAL KNEE REPLACEMENT POSTOPERATIVE DIRECTIONS ° ° ° °Knee Rehabilitation, Guidelines Following Surgery  °Results after knee surgery are often greatly improved when you follow the exercise, range of motion and muscle strengthening exercises prescribed by your doctor. Safety measures are also important to protect the knee from further injury. Any time any of these exercises cause you to have increased pain or swelling in your knee joint, decrease the amount until you are comfortable again and slowly increase them. If you have problems or questions, call your caregiver or physical therapist for advice.  ° °WEIGHT BEARING °Weight bearing as tolerated with assist device (walker, cane, etc) as directed, use it as long as suggested by your surgeon or therapist, typically at least 4-6 weeks. ° °HOME CARE INSTRUCTIONS  °Remove items at home which could result in a fall. This includes throw rugs or furniture in walking pathways.  °Continue medications as instructed at time of discharge. °You may have some home medications which will be placed on hold until you complete the course of blood thinner medication.  °You may start showering once you are discharged home but do not submerge the incision under water. Just pat the incision dry and apply a dry gauze dressing on daily. °Walk with walker as instructed.  °You may resume a sexual relationship in one month or when given the OK by your doctor.  °· Use walker as long as suggested by your caregivers. °· Avoid periods of inactivity such as sitting longer than an hour when not asleep. This helps prevent blood clots.  °You may put full weight on your legs and walk as much as is comfortable.  °You may return to work once you are cleared by your doctor.  °Do not drive a car for 6 weeks or until released by you surgeon.  °· Do not drive  while taking narcotics.  °Wear the elastic stockings for three weeks following surgery during the day but you may remove then at night. °Make sure you keep all of your appointments after your operation with all of your doctors and caregivers. You should call the office at the above phone number and make an appointment for approximately two weeks after the date of your surgery. °Do not remove your surgical dressing. The dressing is waterproof; you may take showers in 3 days, but do not take tub baths or submerge the dressing. °Please pick up a stool softener and laxative for home use as long as you are requiring pain medications. °· ICE to the affected knee every three hours for 30 minutes at a time and then as needed for pain and swelling.  Continue to use ice on the knee for pain and swelling from surgery. You may notice swelling that will progress down to the foot and ankle.  This is normal after surgery.  Elevate the leg when you are not up walking on it.   °It is important for you to complete the blood thinner medication as prescribed by your doctor. °· Continue to use the breathing machine which will help keep your temperature down.  It is common for your temperature to cycle up and down following surgery, especially at night when you are not up moving around and exerting yourself.  The breathing machine keeps your lungs expanded and your temperature down. ° °RANGE OF MOTION AND STRENGTHENING EXERCISES  °Rehabilitation of the knee is important following   a knee injury or an operation. After just a few days of immobilization, the muscles of the thigh which control the knee become weakened and shrink (atrophy). Knee exercises are designed to build up the tone and strength of the thigh muscles and to improve knee motion. Often times heat used for twenty to thirty minutes before working out will loosen up your tissues and help with improving the range of motion but do not use heat for the first two weeks following  surgery. These exercises can be done on a training (exercise) mat, on the floor, on a table or on a bed. Use what ever works the best and is most comfortable for you Knee exercises include:  °Leg Lifts - While your knee is still immobilized in a splint or cast, you can do straight leg raises. Lift the leg to 60 degrees, hold for 3 sec, and slowly lower the leg. Repeat 10-20 times 2-3 times daily. Perform this exercise against resistance later as your knee gets better.  °Quad and Hamstring Sets - Tighten up the muscle on the front of the thigh (Quad) and hold for 5-10 sec. Repeat this 10-20 times hourly. Hamstring sets are done by pushing the foot backward against an object and holding for 5-10 sec. Repeat as with quad sets.  °A rehabilitation program following serious knee injuries can speed recovery and prevent re-injury in the future due to weakened muscles. Contact your doctor or a physical therapist for more information on knee rehabilitation.  ° °SKILLED REHAB INSTRUCTIONS: °If the patient is transferred to a skilled rehab facility following release from the hospital, a list of the current medications will be sent to the facility for the patient to continue.  When discharged from the skilled rehab facility, please have the facility set up the patient's Home Health Physical Therapy prior to being released. Also, the skilled facility will be responsible for providing the patient with their medications at time of release from the facility to include their pain medication, the muscle relaxants, and their blood thinner medication. If the patient is still at the rehab facility at time of the two week follow up appointment, the skilled rehab facility will also need to assist the patient in arranging follow up appointment in our office and any transportation needs. ° °MAKE SURE YOU:  °Understand these instructions.  °Will watch your condition.  °Will get help right away if you are not doing well or get worse.   ° ° °Pick up stool softner and laxative for home use following surgery while on pain medications. °Do NOT remove your dressing. You may shower.  °Do not take tub baths or submerge incision under water. °May shower starting three days after surgery. °Please use a clean towel to pat the incision dry following showers. °Continue to use ice for pain and swelling after surgery. °Do not use any lotions or creams on the incision until instructed by your surgeon. ° °

## 2019-06-28 ENCOUNTER — Encounter (HOSPITAL_COMMUNITY): Payer: Self-pay | Admitting: Orthopedic Surgery

## 2019-06-28 LAB — CBC
HCT: 32.7 % — ABNORMAL LOW (ref 39.0–52.0)
Hemoglobin: 10.9 g/dL — ABNORMAL LOW (ref 13.0–17.0)
MCH: 30.7 pg (ref 26.0–34.0)
MCHC: 33.3 g/dL (ref 30.0–36.0)
MCV: 92.1 fL (ref 80.0–100.0)
Platelets: 311 10*3/uL (ref 150–400)
RBC: 3.55 MIL/uL — ABNORMAL LOW (ref 4.22–5.81)
RDW: 12.7 % (ref 11.5–15.5)
WBC: 17 10*3/uL — ABNORMAL HIGH (ref 4.0–10.5)
nRBC: 0 % (ref 0.0–0.2)

## 2019-06-28 LAB — BASIC METABOLIC PANEL
Anion gap: 13 (ref 5–15)
BUN: 35 mg/dL — ABNORMAL HIGH (ref 8–23)
CO2: 19 mmol/L — ABNORMAL LOW (ref 22–32)
Calcium: 8.7 mg/dL — ABNORMAL LOW (ref 8.9–10.3)
Chloride: 100 mmol/L (ref 98–111)
Creatinine, Ser: 1.75 mg/dL — ABNORMAL HIGH (ref 0.61–1.24)
GFR calc Af Amer: 48 mL/min — ABNORMAL LOW (ref >60)
GFR calc non Af Amer: 41 mL/min — ABNORMAL LOW (ref >60)
Glucose, Bld: 135 mg/dL — ABNORMAL HIGH (ref 70–99)
Potassium: 4.2 mmol/L (ref 3.5–5.1)
Sodium: 132 mmol/L — ABNORMAL LOW (ref 135–145)

## 2019-06-28 LAB — GLUCOSE, CAPILLARY
Glucose-Capillary: 147 mg/dL — ABNORMAL HIGH (ref 70–99)
Glucose-Capillary: 127 mg/dL — ABNORMAL HIGH (ref 70–99)

## 2019-06-28 MED ORDER — ONDANSETRON HCL 4 MG PO TABS: 4.0000 mg | ORAL_TABLET | Freq: Four times a day (QID) | ORAL | 0 refills | Status: DC | PRN

## 2019-06-28 MED ORDER — ASPIRIN 81 MG PO CHEW: 81.0000 mg | CHEWABLE_TABLET | Freq: Two times a day (BID) | ORAL | 1 refills | Status: AC

## 2019-06-28 MED ORDER — HYDROCODONE-ACETAMINOPHEN 5-325 MG PO TABS: 1.0000 | ORAL_TABLET | ORAL | 0 refills | Status: DC | PRN

## 2019-06-28 MED ORDER — DOCUSATE SODIUM 100 MG PO CAPS: 100.0000 mg | ORAL_CAPSULE | Freq: Two times a day (BID) | ORAL | 1 refills | Status: DC

## 2019-06-28 MED ORDER — GABAPENTIN 300 MG PO CAPS: 300.0000 mg | ORAL_CAPSULE | Freq: Every day | ORAL | 0 refills | Status: DC

## 2019-06-28 MED ORDER — SENNA 8.6 MG PO TABS: 1.0000 | ORAL_TABLET | Freq: Two times a day (BID) | ORAL | 0 refills | Status: DC

## 2019-06-28 NOTE — Discharge Summary (Signed)
Physician Discharge Summary  Patient ID: Bryan Atkinson MRN: [id number] DOB/AGE: 02-23-1956 44 y.o.  Admit date: 06/27/2019 Discharge date: 06/28/2019  Admission Diagnoses:  <principal problem not specified>  Discharge Diagnoses:  Active Problems:   Osteoarthritis of left knee   Past Medical History:  Diagnosis Date  . Arthritis   . Diabetes mellitus without complication ([place])   . Gout   . History of kidney stones   . Hypertension     Surgeries: Procedure(s): COMPUTER ASSISTED TOTAL KNEE ARTHROPLASTY on 06/27/2019   Consultants (if any):   Discharged Condition: Improved  Hospital Course: Bryan Atkinson is an 63 y.o. male who was admitted 06/27/2019 with a diagnosis of <principal problem not specified> and went to the operating room on 06/27/2019 and underwent the above named procedures.    He was given perioperative antibiotics:  Anti-infectives (From admission, onward)   Start     Dose/Rate Route Frequency Ordered Stop   06/27/19 1700  ceFAZolin (ANCEF) IVPB 2g/100 mL premix     2 g 200 mL/hr over 30 Minutes Intravenous Every 6 hours 06/27/19 1503 06/28/19 0055   06/27/19 0745  ceFAZolin (ANCEF) IVPB 2g/100 mL premix     2 g 200 mL/hr over 30 Minutes Intravenous On call to O.R. 06/27/19 [id number] 06/27/19 1103    .  He was given sequential compression devices, early ambulation, and ASA for DVT prophylaxis.  He benefited maximally from the hospital stay and there were no complications.    Recent vital signs:  Vitals:   06/28/19 0837 06/28/19 1258  BP: 136/82 (!) 138/93  Pulse: 87 95  Resp: 15 18  Temp: 98.4 F (36.9 C) 98 F (36.7 C)  SpO2: 97% 97%    Recent laboratory studies:  Lab Results  Component Value Date   HGB 10.9 (L) 06/28/2019   HGB 13.9 06/25/2019   HGB 13.9 07/06/2015   Lab Results  Component Value Date   WBC 17.0 (H) 06/28/2019   PLT 311 06/28/2019   Lab Results  Component Value Date   INR 1.0 06/25/2019   Lab Results   Component Value Date   NA 132 (L) 06/28/2019   K 4.2 06/28/2019   CL 100 06/28/2019   CO2 19 (L) 06/28/2019   BUN 35 (H) 06/28/2019   CREATININE 1.75 (H) 06/28/2019   GLUCOSE 135 (H) 06/28/2019    Discharge Medications:   Allergies as of 06/28/2019   No Known Allergies     Medication List    STOP taking these medications   aspirin 81 MG tablet Replaced by: aspirin 81 MG chewable tablet   naproxen 500 MG tablet Commonly known as: NAPROSYN     TAKE these medications   allopurinol 300 MG tablet Commonly known as: ZYLOPRIM Take 300 mg by mouth daily.   amLODipine 10 MG tablet Commonly known as: NORVASC Take 10 mg by mouth daily.   APPLE CIDER VINEGAR PO Take 1 capsule by mouth daily.   aspirin 81 MG chewable tablet Chew 1 tablet (81 mg total) by mouth 2 (two) times daily. Replaces: aspirin 81 MG tablet   atenolol 50 MG tablet Commonly known as: TENORMIN Take 50 mg by mouth 2 (two) times daily.   docusate sodium 100 MG capsule Commonly known as: COLACE Take 1 capsule (100 mg total) by mouth 2 (two) times daily.   fenofibrate 145 MG tablet Commonly known as: TRICOR Take 145 mg by mouth daily.   Fish Oil 1000 MG Caps Take 1,000 mg by  mouth 2 (two) times daily.   gabapentin 300 MG capsule Commonly known as: NEURONTIN Take 1 capsule (300 mg total) by mouth at bedtime.   glucose blood test strip 1 each by Other route as needed for other. Use as instructed   HYDROcodone-acetaminophen 5-325 MG tablet Commonly known as: NORCO/VICODIN Take 1 tablet by mouth every 4 (four) hours as needed for moderate pain (pain score 4-6).   Krill Oil 500 MG Caps Take 500 mg by mouth 2 (two) times a day.   lisinopril-hydrochlorothiazide 20-25 MG tablet Commonly known as: ZESTORETIC Take 1 tablet by mouth 2 (two) times daily.   metFORMIN 1000 MG tablet Commonly known as: GLUCOPHAGE Take 1,000 mg by mouth 2 (two) times daily.   multivitamin tablet Take 1 tablet by mouth  daily.   niacin 1000 MG CR tablet Commonly known as: NIASPAN Take 1,000 mg by mouth at bedtime.   ondansetron 4 MG tablet Commonly known as: ZOFRAN Take 1 tablet (4 mg total) by mouth every 6 (six) hours as needed for nausea.   pravastatin 40 MG tablet Commonly known as: PRAVACHOL Take 40 mg by mouth daily.   senna 8.6 MG Tabs tablet Commonly known as: SENOKOT Take 1 tablet (8.6 mg total) by mouth 2 (two) times daily.   Turmeric 500 MG Caps Take 500 mg by mouth daily.       Diagnostic Studies: Dg Knee Left Port  Result Date: 06/27/2019 CLINICAL DATA:  Status post left knee replacement. EXAM: PORTABLE LEFT KNEE - 1-2 VIEW COMPARISON:  None. FINDINGS: The left femoral and tibial components appear to be well situated. Expected postoperative changes are seen in the soft tissues anteriorly. IMPRESSION: Status post left total knee arthroplasty. Electronically Signed   By: Marijo Conception M.D.   On: 06/27/2019 14:46    Disposition:    Discharge Instructions    Call MD / Call 911   Complete by: As directed    If you experience chest pain or shortness of breath, CALL 911 and be transported to the hospital emergency room.  If you develope a fever above 101 F, pus (white drainage) or increased drainage or redness at the wound, or calf pain, call your surgeon's office.   Constipation Prevention   Complete by: As directed    Drink plenty of fluids.  Prune juice may be helpful.  You may use a stool softener, such as Colace (over the counter) 100 mg twice a day.  Use MiraLax (over the counter) for constipation as needed.   Diet - low sodium heart healthy   Complete by: As directed    Do not put a pillow under the knee. Place it under the heel.   Complete by: As directed    Driving restrictions   Complete by: As directed    No driving for 6 weeks   Increase activity slowly as tolerated   Complete by: As directed    Lifting restrictions   Complete by: As directed    No lifting for 6  weeks   TED hose   Complete by: As directed    Use stockings (TED hose) for 2 weeks on both leg(s).  You may remove them at night for sleeping.      Follow-up Information    Yuktha Kerchner, Aaron Edelman, MD. Schedule an appointment as soon as possible for a visit in 2 weeks.   Specialty: Orthopedic Surgery Why: For wound re-check Contact information: [address] [place] [address] [phone]  Signed: Hilton Cork Kendyl Festa 06/28/2019, 4:51 PM

## 2019-06-28 NOTE — Progress Notes (Signed)
Physical Therapy Treatment Patient Details Name: Bryan Atkinson MRN: [id number] DOB: Jul 21, 1956 Today's Date: 06/28/2019    History of Present Illness 63 yo male s/p L TKR on 06/27/19. PMH includes OA, DM, HTN.    PT Comments    POD # 1 am session Assisted OOB.  General bed mobility comments: demonstarted and instructed how to use a belt to self assist LE.  Assisted with amb.  Practiced stairs.  General stair comments: one crutch/one rail 50% VC's on proper sequencing and safety using one crutch.  Then returned to room to perform some TE's following HEP handout.  Instructed on proper tech, freq as well as use of ICE.   Addressed all mobility questions, discussed appropriate activity, educated on use of ICE.  Pt ready for D/C to home.   Follow Up Recommendations  Follow surgeon's recommendation for DC plan and follow-up therapies;Supervision for mobility/OOB     Equipment Recommendations  Rolling walker with 5" wheels    Recommendations for Other Services       Precautions / Restrictions Precautions Precautions: Fall Restrictions Weight Bearing Restrictions: No Other Position/Activity Restrictions: WBAT    Mobility  Bed Mobility Overal bed mobility: Needs Assistance Bed Mobility: Supine to Sit     Supine to sit: Supervision     General bed mobility comments: demonstarted and instructed how to use a belt to self assist LE  Transfers Overall transfer level: Needs assistance Equipment used: Rolling walker (2 wheeled) Transfers: Sit to/from Stand Sit to Stand: Min guard;From elevated surface         General transfer comment: for safety, verbal cuing for hand placement when rising.  Ambulation/Gait Ambulation/Gait assistance: Supervision Gait Distance (Feet): 85 Feet Assistive device: Rolling walker (2 wheeled) Gait Pattern/deviations: Step-to pattern;Decreased step length - right;Decreased step length - left;Decreased weight shift to left;Decreased stance  time - left;Antalgic;Trunk flexed Gait velocity: decreased   General Gait Details: tolerated an increased distance   Stairs Stairs: Yes Stairs assistance: Min guard Stair Management: One rail Left;Step to pattern;Forwards;With crutches Number of Stairs: 4 General stair comments: one crutch/one rail 50% VC's on proper sequencing and safety using one crutch   Wheelchair Mobility    Modified Rankin (Stroke Patients Only)       Balance                                            Cognition Arousal/Alertness: Awake/alert Behavior During Therapy: WFL for tasks assessed/performed Overall Cognitive Status: Within Functional Limits for tasks assessed                                        Exercises   Total Knee Replacement TE's 10 reps B LE ankle pumps 10 reps towel squeezes 10 reps knee presses 10 reps heel slides  10 reps SAQ's 10 reps SLR's 10 reps ABD Followed by ICE     General Comments        Pertinent Vitals/Pain Pain Assessment: 0-10 Pain Score: 5  Pain Location: L knee Pain Descriptors / Indicators: Sore;Operative site guarding;Tightness Pain Intervention(s): Monitored during session;Premedicated before session;Repositioned;Ice applied    Home Living                      Prior Function  PT Goals (current goals can now be found in the care plan section) Progress towards PT goals: Progressing toward goals    Frequency    7X/week      PT Plan Current plan remains appropriate    Co-evaluation              AM-PAC PT "6 Clicks" Mobility   Outcome Measure  Help needed turning from your back to your side while in a flat bed without using bedrails?: A Little Help needed moving from lying on your back to sitting on the side of a flat bed without using bedrails?: A Little Help needed moving to and from a bed to a chair (including a wheelchair)?: A Little Help needed standing up from a chair  using your arms (e.g., wheelchair or bedside chair)?: A Little Help needed to walk in hospital room?: A Little Help needed climbing 3-5 steps with a railing? : A Little 6 Click Score: 18    End of Session Equipment Utilized During Treatment: Gait belt Activity Tolerance: Patient tolerated treatment well Patient left: with chair alarm set;with call bell/phone within reach;in bed Nurse Communication: (pt ready for D/C to home) PT Visit Diagnosis: Other abnormalities of gait and mobility (R26.89);Difficulty in walking, not elsewhere classified (R26.2)     Time: 0930-1010 PT Time Calculation (min) (ACUTE ONLY): 40 min  Charges:  $Gait Training: 8-22 mins $Therapeutic Exercise: 8-22 mins $Therapeutic Activity: 8-22 mins                     Rica Koyanagi  PTA Acute  Rehabilitation Services Pager      [phone] Office      [phone]

## 2019-06-28 NOTE — TOC Transition Note (Signed)
Transition of Care [facility]) - CM/SW Discharge Note   Patient Details  Name: Bryan Atkinson MRN: [id number] Date of Birth: Dec 05, 1955  Transition of Care [facility]) CM/SW Contact:  Lia Hopping, [place] Phone Number: 06/28/2019, 10:22 AM   Clinical Narrative:    Therapy Plan: Outpatient PT DME: RW ordered through [facility], [place] will be delivered to the patient room.    Final next level of care: OP Rehab Barriers to Discharge: No Barriers Identified   Patient Goals and CMS Choice Patient states their goals for this hospitalization and ongoing recovery are:: to get better      Discharge Placement  Home                      Discharge Plan and Services                DME Arranged: Walker rolling DME Agency: [employer] Date DME Agency Contacted: 06/28/19 Time DME Agency Contacted: [id number] Representative spoke with at DME Agency: [place] Determinants of Health ([place]) Interventions     Readmission Risk Interventions No flowsheet data found.

## 2019-06-28 NOTE — Progress Notes (Signed)
    Subjective:  Patient reports pain as mild to moderate.  Denies N/V/CP/SOB. No c/o. Progressed rapidly with PT  Objective:   VITALS:   Vitals:   06/27/19 2010 06/28/19 0212 06/28/19 0629 06/28/19 0837  BP: 115/86 (!) 138/93 (!) 146/98 136/82  Pulse: 98 82 78 87  Resp: 20 18 17 15   Temp: 98.1 F (36.7 C) 97.8 F (36.6 C) 97.9 F (36.6 C) 98.4 F (36.9 C)  TempSrc: Oral Oral    SpO2: 97% 98% 98% 97%  Weight:      Height:        NAD ABD soft Sensation intact distally Intact pulses distally Dorsiflexion/Plantar flexion intact Incision: dressing C/D/I Compartment soft Able to SLR  Lab Results  Component Value Date   WBC 17.0 (H) 06/28/2019   HGB 10.9 (L) 06/28/2019   HCT 32.7 (L) 06/28/2019   MCV 92.1 06/28/2019   PLT 311 06/28/2019   BMET    Component Value Date/Time   NA 132 (L) 06/28/2019 0234   K 4.2 06/28/2019 0234   CL 100 06/28/2019 0234   CO2 19 (L) 06/28/2019 0234   GLUCOSE 135 (H) 06/28/2019 0234   BUN 35 (H) 06/28/2019 0234   CREATININE 1.75 (H) 06/28/2019 0234   CALCIUM 8.7 (L) 06/28/2019 0234   GFRNONAA 41 (L) 06/28/2019 0234   GFRAA 48 (L) 06/28/2019 0234     Assessment/Plan: 1 Day Post-Op   Active Problems:   Osteoarthritis of left knee   WBAT with walker DVT ppx: Aspirin, SCDs, TEDS PO pain control PT/OT Dispo: D/C home with OPPT    Hilton Cork Lorcan Shelp 06/28/2019, 12:05 PM   Rod Can, MD Cell: [phone] [place] is now [facility]  [place] [address], [address], [place], [place] [address] Phone: [phone] [email or web address] Facebook  [employer]

## 2019-06-30 ENCOUNTER — Encounter (HOSPITAL_COMMUNITY): Payer: Self-pay | Admitting: Orthopedic Surgery

## 2019-06-30 NOTE — Anesthesia Postprocedure Evaluation (Signed)
Anesthesia Post Note  Patient: Bryan Atkinson  Procedure(s) Performed: COMPUTER ASSISTED TOTAL KNEE ARTHROPLASTY (Left Knee)     Patient location during evaluation: PACU Anesthesia Type: Regional, MAC and Spinal Level of consciousness: awake and alert Pain management: pain level controlled Vital Signs Assessment: post-procedure vital signs reviewed and stable Respiratory status: spontaneous breathing, nonlabored ventilation, respiratory function stable and patient connected to nasal cannula oxygen Cardiovascular status: stable and blood pressure returned to baseline Postop Assessment: no apparent nausea or vomiting and spinal receding Anesthetic complications: no    Last Vitals:  Vitals:   06/28/19 0837 06/28/19 1258  BP: 136/82 (!) 138/93  Pulse: 87 95  Resp: 15 18  Temp: 36.9 C 36.7 C  SpO2: 97% 97%    Last Pain:  Vitals:   06/28/19 1258  TempSrc: Oral  PainSc:                  Ayana Imhof

## 2019-07-01 ENCOUNTER — Encounter: Payer: Self-pay | Admitting: Physical Therapy

## 2019-07-01 ENCOUNTER — Ambulatory Visit: Payer: 59 | Attending: Orthopedic Surgery | Admitting: Physical Therapy

## 2019-07-01 ENCOUNTER — Other Ambulatory Visit: Payer: Self-pay

## 2019-07-01 DIAGNOSIS — M25562 Pain in left knee: Secondary | ICD-10-CM | POA: Diagnosis present

## 2019-07-01 DIAGNOSIS — M25662 Stiffness of left knee, not elsewhere classified: Secondary | ICD-10-CM | POA: Insufficient documentation

## 2019-07-01 NOTE — Therapy (Signed)
[facility] [place], [place], [address] Phone: [phone]   Fax:  [phone]  Physical Therapy Evaluation  Patient Details  Name: Bryan Atkinson MRN: [id number] Date of Birth: 1956/04/21 Referring Provider (PT): Rod Can, MD   Encounter Date: 07/01/2019  PT End of Session - 07/01/19 1257    Visit Number  1    Number of Visits  18    Date for PT Re-Evaluation  08/19/19    Authorization Type  FOTO; progress note every 10th visit    PT Start Time  1030    PT Stop Time  1110    PT Time Calculation (min)  40 min    Equipment Utilized During Treatment  Other (comment)   rolling walker   Activity Tolerance  Patient tolerated treatment well    Behavior During Therapy  [facility] for tasks assessed/performed       Past Medical History:  Diagnosis Date  . Arthritis   . Diabetes mellitus without complication ([place])   . Gout   . History of kidney stones   . Hypertension     Past Surgical History:  Procedure Laterality Date  . CATARACT EXTRACTION W/PHACO Right 05/13/2019   Procedure: CATARACT EXTRACTION PHACO AND INTRAOCULAR LENS PLACEMENT (IOC);  Surgeon: Baruch Goldmann, MD;  Location: [facility];  Service: Ophthalmology;  Laterality: Right;  CDE: 10.31  . CATARACT EXTRACTION W/PHACO Left 05/27/2019   Procedure: CATARACT EXTRACTION PHACO AND INTRAOCULAR LENS PLACEMENT (IOC);  Surgeon: Baruch Goldmann, MD;  Location: [facility];  Service: Ophthalmology;  Laterality: Left;  CDE: 6.49  . COLONOSCOPY     times two 2002 ,2007  . COLONOSCOPY N/A 10/07/2013   Procedure: COLONOSCOPY;  Surgeon: Daneil Dolin, MD;  Location: [facility];  Service: Endoscopy;  Laterality: N/A;  8:30 AM  . COLONOSCOPY N/A 10/05/2018   Procedure: COLONOSCOPY;  Surgeon: Daneil Dolin, MD;  Location: [facility];  Service: Endoscopy;  Laterality: N/A;  8:30  . KNEE ARTHROPLASTY Left 06/27/2019   Procedure: COMPUTER ASSISTED TOTAL KNEE ARTHROPLASTY;   Surgeon: Rod Can, MD;  Location: [facility];  Service: Orthopedics;  Laterality: Left;  . LEG SURGERY Right early 30's  . NO PAST SURGERIES      There were no vitals filed for this visit.   Subjective Assessment - 07/01/19 1106    Subjective  COVID-19 screening performed upon arrival.Patient arrives to physical therapy with reports of left knee pain, difficulty walking, and decreased left knee ROM secondary to a left TKA on 06/27/2019. Patient ambulates with a rolling walker in his home and in the community. Patient uses a single axillary crutch for stair ambulation. Patient reports ability to perform all ADLs but with increased time to perform activities. Patient reports compliancy with icing regimen and HEP provided by doctor. Patient reports pain at worst as 7/10 and pain at best as 1-2/10. Patient's goals are to decrease pain, improve movement, improve strength, improve standing tolerance, and improve ability to perform home activities.    Patient is accompained by:  Family member   wife   Pertinent History  left TKA 06/27/2019    Limitations  Sitting;Standing;Walking;House hold activities    How long can you walk comfortably?  room to room    Diagnostic tests  x-ray    Patient Stated Goals  decrease pain, return to normal activities.    Currently in Pain?  Yes    Pain Score  3     Pain Location  Knee    Pain Orientation  Left    Pain Descriptors / Indicators  Aching;Dull    Pain Type  Surgical pain    Pain Onset  1 to 4 weeks ago    Pain Frequency  Constant    Aggravating Factors   too much activity    Pain Relieving Factors  resting, ice, elevation    Effect of Pain on Daily Activities  increased time to perform ADLs, requires AD for ambulation.         [facility] PT Assessment - 07/01/19 0001      Assessment   Medical Diagnosis  osteoarthritis of left knee    Referring Provider (PT)  Rod Can, MD    Onset Date/Surgical Date  06/27/19    Next MD Visit  07/12/2019     Prior Therapy  in hospital      Precautions   Precautions  Other (comment)    Precaution Comments  no ultrasound      Balance Screen   Has the patient fallen in the past 6 months  No    Has the patient had a decrease in activity level because of a fear of falling?   No    Is the patient reluctant to leave their home because of a fear of falling?   No      Home Environment   Living Environment  Private residence    Living Arrangements  Spouse/significant other    Type of [place] to enter    Entrance Stairs-Number of Steps  3    Entrance Stairs-Rails  Left      Prior Function   Level of Independence  Independent with basic ADLs;Independent with household mobility with device      Observation/Other Assessments   Observations  ace bandage donned from left mid thigh to ankle. unable to remove until 07/12/2019 per MD and able to shower with it on.     Focus on Therapeutic Outcomes (FOTO)   57% limited      ROM / Strength   AROM / PROM / Strength  AROM;PROM      AROM   Overall AROM   Deficits    AROM Assessment Site  Knee    Right/Left Knee  Left    Left Knee Extension  10    Left Knee Flexion  87      PROM   Overall PROM   Deficits    PROM Assessment Site  Knee    Right/Left Knee  Left    Left Knee Extension  7    Left Knee Flexion  101                Objective measurements completed on examination: See above findings.      [facility] Adult PT Treatment/Exercise - 07/01/19 0001      Modalities   Modalities  Vasopneumatic      Vasopneumatic   Number Minutes Vasopneumatic   10 minutes    Vasopnuematic Location   Knee    Vasopneumatic Pressure  Low             PT Education - 07/01/19 1256    Education Details  heel prop in sititng and supine, preventing hip ER while elevated    Person(s) Educated  Patient    Methods  Explanation;Demonstration;Handout    Comprehension  Verbalized understanding;Returned demonstration        PT Short Term Goals - 07/01/19  [place] #1   Title  Patient will be independent with initial HEP    Time  3    Period  Weeks    Status  New      PT SHORT TERM GOAL #2   Title  Patient will demonstrate 90+ degrees of left knee flexion AROM to improve ability to perform functional tasks.    Time  3    Period  Weeks    Status  New      PT SHORT TERM GOAL #3   Title  Patient will demonstrate 5 degrees or less of left knee extension AROM to improve gait mechanics.    Time  3    Period  Weeks    Status  New        PT Long Term Goals - 07/01/19 1413      PT LONG TERM GOAL #1   Title  Patient will be independent with advanced HEP    Time  6    Period  Weeks    Status  New      PT LONG TERM GOAL #2   Title  Patient will demonstrate 115+ degrees of left knee flexion AROM to improve ability to perform functional tasks.    Time  6    Period  Weeks    Status  New      PT LONG TERM GOAL #3   Title  Patient will demonstrate 3 degrees or less of left knee extension AROM to improve gait mechanics.    Time  6    Period  Weeks    Status  New      PT LONG TERM GOAL #4   Title  Patient will report ability to perform ADLs and home activities with left knee pain less than or equal to 3/10.    Time  6    Period  Weeks    Status  New      PT LONG TERM GOAL #5   Title  Patient will demonstrate reciprocating stair negotiation with 1 rail and no AD to safely enter and exit home.    Time  6    Period  Weeks    Status  New             Plan - 07/01/19 1407    Clinical Impression Statement  Patient is a 63 year old male who presents to physical therapy with his wife with left knee pain, decreased left knee ROM, and difficulty walking secondary to a left TKA on 06/27/2019. Patient has ace bandage donned and is not to remove it until follow up visit per MD.  Patient is able to perform transfers and bed mobility with modified independence. Patient ambulates with  a rolling walker with a step to gait pattern, decreased left stance time, and increased knee flexion during stance phase. Patient and PT discussed continuing HEP as well as adding heel prop to improve knee extension ROM. Patient reported understanding. Patient would benefit from skilled physical therapy to address deficits and address patient's goals.    Personal Factors and Comorbidities  Age;Comorbidity 2    Comorbidities  L TKA 06/27/2019; HTN, DM    Examination-Activity Limitations  Stand;Stairs;Sit    Examination-Participation Restrictions  Driving    Stability/Clinical Decision Making  Stable/Uncomplicated    Clinical Decision Making  Low    Rehab Potential  Good    PT Frequency  3x / week  PT Duration  6 weeks    PT Treatment/Interventions  ADLs/Self Care Home Management;Cryotherapy;Electrical Stimulation;Iontophoresis 4mg /ml Dexamethasone;Moist Heat;Balance training;Therapeutic exercise;Therapeutic activities;Functional mobility training;Stair training;Gait training;Neuromuscular re-education;Patient/family education;Passive range of motion;Vasopneumatic Device;Taping;Manual techniques    PT Next Visit Plan  Nustep, knee AROM exercises, gait training to progress to ambulation without AD, left knee PROM, modalities PRN for pain relief.    PT Home Exercise Plan  see patient education section    Consulted and Agree with Plan of Care  Patient       Patient will benefit from skilled therapeutic intervention in order to improve the following deficits and impairments:  Decreased activity tolerance, Decreased strength, Decreased range of motion, Difficulty walking, Pain  Visit Diagnosis: Acute pain of left knee - Plan: PT plan of care cert/re-cert  Stiffness of left knee, not elsewhere classified - Plan: PT plan of care cert/re-cert     Problem List Patient Active Problem List   Diagnosis Date Noted  . Osteoarthritis of left knee 06/27/2019    Gabriela Eves PT, DPT 07/01/2019,  2:23 PM  [facility] [address] [place], [place], [address] Phone: [phone]   Fax:  [phone]  Name: Bryan Atkinson MRN: [id number] Date of Birth: 08-04-1956

## 2019-07-03 ENCOUNTER — Encounter: Payer: Self-pay | Admitting: Physical Therapy

## 2019-07-03 ENCOUNTER — Other Ambulatory Visit: Payer: Self-pay

## 2019-07-03 ENCOUNTER — Ambulatory Visit: Payer: 59 | Admitting: Physical Therapy

## 2019-07-03 DIAGNOSIS — M25562 Pain in left knee: Secondary | ICD-10-CM | POA: Diagnosis not present

## 2019-07-03 DIAGNOSIS — M25662 Stiffness of left knee, not elsewhere classified: Secondary | ICD-10-CM

## 2019-07-03 NOTE — Therapy (Signed)
[facility] [place], [place], [address] Phone: [phone]   Fax:  [phone]  Physical Therapy Treatment  Patient Details  Name: Bryan Atkinson MRN: [id number] Date of Birth: 07-Apr-1956 Referring Provider (PT): Rod Can, MD   Encounter Date: 07/03/2019  PT End of Session - 07/03/19 1114    Visit Number  2    Number of Visits  18    Date for PT Re-Evaluation  08/19/19    Authorization Type  FOTO; progress note every 10th visit    PT Start Time  1030    PT Stop Time  1117    PT Time Calculation (min)  47 min    Activity Tolerance  Patient tolerated treatment well       Past Medical History:  Diagnosis Date  . Arthritis   . Diabetes mellitus without complication ([place])   . Gout   . History of kidney stones   . Hypertension     Past Surgical History:  Procedure Laterality Date  . CATARACT EXTRACTION W/PHACO Right 05/13/2019   Procedure: CATARACT EXTRACTION PHACO AND INTRAOCULAR LENS PLACEMENT (IOC);  Surgeon: Baruch Goldmann, MD;  Location: [facility];  Service: Ophthalmology;  Laterality: Right;  CDE: 10.31  . CATARACT EXTRACTION W/PHACO Left 05/27/2019   Procedure: CATARACT EXTRACTION PHACO AND INTRAOCULAR LENS PLACEMENT (IOC);  Surgeon: Baruch Goldmann, MD;  Location: [facility];  Service: Ophthalmology;  Laterality: Left;  CDE: 6.49  . COLONOSCOPY     times two 2002 ,2007  . COLONOSCOPY N/A 10/07/2013   Procedure: COLONOSCOPY;  Surgeon: Daneil Dolin, MD;  [facility];  Service: Endoscopy;  Laterality: N/A;  8:30 AM  . COLONOSCOPY N/A 10/05/2018   Procedure: COLONOSCOPY;  Surgeon: Daneil Dolin, MD;  [facility];  Service: Endoscopy;  Laterality: N/A;  8:30  . KNEE ARTHROPLASTY Left 06/27/2019   Procedure: COMPUTER ASSISTED TOTAL KNEE ARTHROPLASTY;  Surgeon: Rod Can, MD;  Location: [facility];  Service: Orthopedics;  Laterality: Left;  . LEG SURGERY Right early 7's  . NO PAST  SURGERIES      There were no vitals filed for this visit.  Subjective Assessment - 07/03/19 1111    Subjective  COVID-19 screening performed upon arrival. Patient reports feeling good and has been compliant with HEP at home.    Patient is accompained by:  Family member    Pertinent History  left TKA 06/27/2019    Limitations  Sitting;Standing;Walking;House hold activities    How long can you walk comfortably?  room to room    Diagnostic tests  x-ray    Patient Stated Goals  decrease pain, return to normal activities.    Currently in Pain?  Yes    Pain Score  2     Pain Location  Knee    Pain Orientation  Left    Pain Descriptors / Indicators  Aching;Dull    Pain Type  Surgical pain    Pain Onset  1 to 4 weeks ago    Pain Frequency  Constant         [facility] PT Assessment - 07/03/19 0001      Assessment   Medical Diagnosis  osteoarthritis of left knee    Referring Provider (PT)  Rod Can, MD    Onset Date/Surgical Date  06/27/19    Next MD Visit  07/12/2019    Prior Therapy  in hospital      Precautions   Precautions  Other (comment)  Precaution Comments  no ultrasound                   [facility] Adult PT Treatment/Exercise - 07/03/19 0001      Exercises   Exercises  Knee/Hip      Knee/Hip Exercises: Stretches   Active Hamstring Stretch  Left;5 reps;10 seconds      Knee/Hip Exercises: Aerobic   Nustep  Level 4 x10 minutes seat 11 to 10      Knee/Hip Exercises: Standing   Forward Lunges  Left;2 sets;10 reps      Knee/Hip Exercises: Seated   Ball Squeeze  x20      Modalities   Modalities  Vasopneumatic      Vasopneumatic   Number Minutes Vasopneumatic   10 minutes    Vasopnuematic Location   Knee    Vasopneumatic Pressure  Low    Vasopneumatic Temperature   56      Manual Therapy   Manual Therapy  Passive ROM    Passive ROM  PROM to left knee into flexion and extension to improve ROM               PT Short Term Goals - 07/01/19  1410      PT SHORT TERM GOAL #1   Title  Patient will be independent with initial HEP    Time  3    Period  Weeks    Status  New      PT SHORT TERM GOAL #2   Title  Patient will demonstrate 90+ degrees of left knee flexion AROM to improve ability to perform functional tasks.    Time  3    Period  Weeks    Status  New      PT SHORT TERM GOAL #3   Title  Patient will demonstrate 5 degrees or less of left knee extension AROM to improve gait mechanics.    Time  3    Period  Weeks    Status  New        PT Long Term Goals - 07/01/19 1413      PT LONG TERM GOAL #1   Title  Patient will be independent with advanced HEP    Time  6    Period  Weeks    Status  New      PT LONG TERM GOAL #2   Title  Patient will demonstrate 115+ degrees of left knee flexion AROM to improve ability to perform functional tasks.    Time  6    Period  Weeks    Status  New      PT LONG TERM GOAL #3   Title  Patient will demonstrate 3 degrees or less of left knee extension AROM to improve gait mechanics.    Time  6    Period  Weeks    Status  New      PT LONG TERM GOAL #4   Title  Patient will report ability to perform ADLs and home activities with left knee pain less than or equal to 3/10.    Time  6    Period  Weeks    Status  New      PT LONG TERM GOAL #5   Title  Patient will demonstrate reciprocating stair negotiation with 1 rail and no AD to safely enter and exit home.    Time  6    Period  Weeks    Status  New  Plan - 07/03/19 1122    Clinical Impression Statement  Patient responded well to therapy session with minimal reports of increased pain. Patient was able to demonstrate good form with each exercise after demonstration and explanation. Patient gait trained for proper step through walking mechanics with walker. Patient instructed importance of proper gait mechanics to reduce compensatory habits. Normal response to modalities upon removal of modalities.    Personal  Factors and Comorbidities  Age;Comorbidity 2    Comorbidities  L TKA 06/27/2019; HTN, DM    Examination-Activity Limitations  Stand;Stairs;Sit    Examination-Participation Restrictions  Driving    Stability/Clinical Decision Making  Stable/Uncomplicated    Clinical Decision Making  Low    Rehab Potential  Good    PT Frequency  3x / week    PT Duration  6 weeks    PT Treatment/Interventions  ADLs/Self Care Home Management;Cryotherapy;Electrical Stimulation;Iontophoresis 4mg /ml Dexamethasone;Moist Heat;Balance training;Therapeutic exercise;Therapeutic activities;Functional mobility training;Stair training;Gait training;Neuromuscular re-education;Patient/family education;Passive range of motion;Vasopneumatic Device;Taping;Manual techniques    PT Next Visit Plan  Nustep, knee AROM exercises, gait training to progress to ambulation without AD, left knee PROM, modalities PRN for pain relief.    Consulted and Agree with Plan of Care  Patient       Patient will benefit from skilled therapeutic intervention in order to improve the following deficits and impairments:  Decreased activity tolerance, Decreased strength, Decreased range of motion, Difficulty walking, Pain  Visit Diagnosis: Acute pain of left knee  Stiffness of left knee, not elsewhere classified     Problem List Patient Active Problem List   Diagnosis Date Noted  . Osteoarthritis of left knee 06/27/2019    Gabriela Eves, PT, DPT 07/03/2019, 11:26 AM  [facility] [place], [place], [address] Phone: [phone]   Fax:  [phone]  Name: Bryan Atkinson MRN: [id number] Date of Birth: Jul 14, 1956

## 2019-07-05 ENCOUNTER — Ambulatory Visit: Payer: 59 | Admitting: Physical Therapy

## 2019-07-05 ENCOUNTER — Encounter: Payer: Self-pay | Admitting: Physical Therapy

## 2019-07-05 ENCOUNTER — Other Ambulatory Visit: Payer: Self-pay

## 2019-07-05 DIAGNOSIS — M25562 Pain in left knee: Secondary | ICD-10-CM

## 2019-07-05 DIAGNOSIS — M25662 Stiffness of left knee, not elsewhere classified: Secondary | ICD-10-CM

## 2019-07-05 NOTE — Therapy (Signed)
[facility], [place], [address] Phone: [phone]   Fax:  [phone]  Physical Therapy Treatment  Patient Details  Name: Bryan Atkinson MRN: [id number] Date of Birth: 12/23/1955 Referring Provider (PT): Rod Can, MD   Encounter Date: 07/05/2019  PT End of Session - 07/05/19 0819    Visit Number  3    Number of Visits  18    Date for PT Re-Evaluation  08/19/19    Authorization Type  FOTO; progress note every 10th visit    PT Start Time  0817    PT Stop Time  0905    PT Time Calculation (min)  48 min    Equipment Utilized During Treatment  Other (comment)   FWW   Activity Tolerance  Patient tolerated treatment well    Behavior During Therapy  [facility] for tasks assessed/performed       Past Medical History:  Diagnosis Date  . Arthritis   . Diabetes mellitus without complication ([place])   . Gout   . History of kidney stones   . Hypertension     Past Surgical History:  Procedure Laterality Date  . CATARACT EXTRACTION W/PHACO Right 05/13/2019   Procedure: CATARACT EXTRACTION PHACO AND INTRAOCULAR LENS PLACEMENT (IOC);  Surgeon: Baruch Goldmann, MD;  [facility]: [facility];  Service: Ophthalmology;  Laterality: Right;  CDE: 10.31  . CATARACT EXTRACTION W/PHACO Left 05/27/2019   Procedure: CATARACT EXTRACTION PHACO AND INTRAOCULAR LENS PLACEMENT (IOC);  Surgeon: Baruch Goldmann, MD;  Location: [facility];  Service: Ophthalmology;  Laterality: Left;  CDE: 6.49  . COLONOSCOPY     times two 2002 ,2007  . COLONOSCOPY N/A 10/07/2013   Procedure: COLONOSCOPY;  Surgeon: Daneil Dolin, MD;  [facility];  Service: Endoscopy;  Laterality: N/A;  8:30 AM  . COLONOSCOPY N/A 10/05/2018   Procedure: COLONOSCOPY;  Surgeon: Daneil Dolin, MD;  [facility];  Service: Endoscopy;  Laterality: N/A;  8:30  . KNEE ARTHROPLASTY Left 06/27/2019   Procedure: COMPUTER ASSISTED TOTAL KNEE ARTHROPLASTY;  Surgeon:  Rod Can, MD;  [facility]: [facility];  Service: Orthopedics;  Laterality: Left;  . LEG SURGERY Right early 17's  . NO PAST SURGERIES      There were no vitals filed for this visit.  Subjective Assessment - 07/05/19 0818    Subjective  COVID-19 screening performed upon arrival. Patient reports ACE bandage is coming unraveled.    Patient is accompained by:  Family member    Pertinent History  left TKA 06/27/2019    Limitations  Sitting;Standing;Walking;House hold activities    How long can you walk comfortably?  room to room    Diagnostic tests  x-ray    Patient Stated Goals  decrease pain, return to normal activities.    Currently in Pain?  Yes    Pain Score  2     Pain Location  Knee    Pain Orientation  Left    Pain Descriptors / Indicators  Discomfort    Pain Type  Surgical pain    Pain Onset  1 to 4 weeks ago    Pain Frequency  Constant         [facility] PT Assessment - 07/05/19 0001      Assessment   Medical Diagnosis  osteoarthritis of left knee    Referring Provider (PT)  Rod Can, MD    Onset Date/Surgical Date  06/27/19    Next MD Visit  07/12/2019  Prior Therapy  in hospital      Precautions   Precautions  Other (comment)    Precaution Comments  no ultrasound      ROM / Strength   AROM / PROM / Strength  AROM      AROM   Overall AROM   Within functional limits for tasks performed    AROM Assessment Site  Knee    Right/Left Knee  Left    Left Knee Extension  6    Left Knee Flexion  110                   [facility] Adult PT Treatment/Exercise - 07/05/19 0001      Self-Care   Self-Care  Other Self-Care Comments   PTA rewrapped LLE in ACE bandaging per MD request     Knee/Hip Exercises: Aerobic   Nustep  L4, seat 12-11 x10 min      Knee/Hip Exercises: Seated   Sit to [employer]  15 reps      Knee/Hip Exercises: Supine   Short Arc Quad Sets  AROM;Left;20 reps    Heel Slides  AROM;Left;20 reps    Straight Leg Raises  AROM;Left;20 reps       Modalities   Modalities  Vasopneumatic      Vasopneumatic   Number Minutes Vasopneumatic   10 minutes    Vasopnuematic Location   Knee    Vasopneumatic Pressure  Low    Vasopneumatic Temperature   34               PT Short Term Goals - 07/05/19 0851      PT SHORT TERM GOAL #1   Title  Patient will be independent with initial HEP    Time  3    Period  Weeks    Status  Achieved      PT SHORT TERM GOAL #2   Title  Patient will demonstrate 90+ degrees of left knee flexion AROM to improve ability to perform functional tasks.    Time  3    Period  Weeks    Status  Achieved      PT SHORT TERM GOAL #3   Title  Patient will demonstrate 5 degrees or less of left knee extension AROM to improve gait mechanics.    Time  3    Period  Weeks    Status  On-going        PT Long Term Goals - 07/05/19 0851      PT LONG TERM GOAL #1   Title  Patient will be independent with advanced HEP    Time  6    Period  Weeks    Status  On-going      PT LONG TERM GOAL #2   Title  Patient will demonstrate 115+ degrees of left knee flexion AROM to improve ability to perform functional tasks.    Time  6    Period  Weeks    Status  On-going      PT LONG TERM GOAL #3   Title  Patient will demonstrate 3 degrees or less of left knee extension AROM to improve gait mechanics.    Time  6    Period  Weeks    Status  On-going      PT LONG TERM GOAL #4   Title  Patient will report ability to perform ADLs and home activities with left knee pain less than or equal to 3/10.  Time  6    Period  Weeks    Status  On-going      PT LONG TERM GOAL #5   Title  Patient will demonstrate reciprocating stair negotiation with 1 rail and no AD to safely enter and exit home.    Time  6    Period  Weeks    Status  On-going            Plan - 07/05/19 [id number]    Clinical Impression Statement  Patient is really excelling following L TKR on 06/27/2019. Patient presented in clinic with low level  L knee  pain and reports compliance with HEP. Patient able to complete therex with good quad activation noted during SLR and SAQ. Good overall L knee ROM noted as well with measurement of 6-110 deg. Normal modalities response noted following removal of the modaliteis.    Personal Factors and Comorbidities  Age;Comorbidity 2    Comorbidities  L TKA 06/27/2019; HTN, DM    Examination-Activity Limitations  Stand;Stairs;Sit    Examination-Participation Restrictions  Driving    Stability/Clinical Decision Making  Stable/Uncomplicated    Rehab Potential  Good    PT Frequency  3x / week    PT Duration  6 weeks    PT Treatment/Interventions  ADLs/Self Care Home Management;Cryotherapy;Electrical Stimulation;Iontophoresis 4mg /ml Dexamethasone;Moist Heat;Balance training;Therapeutic exercise;Therapeutic activities;Functional mobility training;Stair training;Gait training;Neuromuscular re-education;Patient/family education;Passive range of motion;Vasopneumatic Device;Taping;Manual techniques    PT Next Visit Plan  Nustep, knee AROM exercises, gait training to progress to ambulation without AD, left knee PROM, modalities PRN for pain relief.    PT Home Exercise Plan  see patient education section    Consulted and Agree with Plan of Care  Patient       Patient will benefit from skilled therapeutic intervention in order to improve the following deficits and impairments:  Decreased activity tolerance, Decreased strength, Decreased range of motion, Difficulty walking, Pain  Visit Diagnosis: Acute pain of left knee  Stiffness of left knee, not elsewhere classified     Problem List Patient Active Problem List   Diagnosis Date Noted  . Osteoarthritis of left knee 06/27/2019    Standley Brooking, PTA 07/05/2019, 9:52 AM  [facility] [address] [place], [place], [address] Phone: [phone]   Fax:  [phone]  Name: Bryan Atkinson MRN: [id number] Date of  Birth: 06-30-1956

## 2019-07-08 ENCOUNTER — Ambulatory Visit: Payer: 59 | Admitting: Physical Therapy

## 2019-07-08 ENCOUNTER — Other Ambulatory Visit: Payer: Self-pay

## 2019-07-08 DIAGNOSIS — M25662 Stiffness of left knee, not elsewhere classified: Secondary | ICD-10-CM

## 2019-07-08 DIAGNOSIS — M25562 Pain in left knee: Secondary | ICD-10-CM | POA: Diagnosis not present

## 2019-07-08 NOTE — Therapy (Signed)
[facility] [place], [place], [address] Phone: [phone]   Fax:  [phone]  Physical Therapy Treatment  Patient Details  Name: Bryan Atkinson MRN: [id number] Date of Birth: 1956-09-28 Referring Provider (PT): Rod Can, MD   Encounter Date: 07/08/2019  PT End of Session - 07/08/19 0846    Visit Number  4    Number of Visits  18    Authorization Type  FOTO; progress note every 10th visit    PT Start Time  0815    PT Stop Time  0904    PT Time Calculation (min)  49 min       Past Medical History:  Diagnosis Date  . Arthritis   . Diabetes mellitus without complication ([place])   . Gout   . History of kidney stones   . Hypertension     Past Surgical History:  Procedure Laterality Date  . CATARACT EXTRACTION W/PHACO Right 05/13/2019   Procedure: CATARACT EXTRACTION PHACO AND INTRAOCULAR LENS PLACEMENT (IOC);  Surgeon: Baruch Goldmann, MD;  [facility]: [facility];  Service: Ophthalmology;  Laterality: Right;  CDE: 10.31  . CATARACT EXTRACTION W/PHACO Left 05/27/2019   Procedure: CATARACT EXTRACTION PHACO AND INTRAOCULAR LENS PLACEMENT (IOC);  Surgeon: Baruch Goldmann, MD;  Location: [facility];  Service: Ophthalmology;  Laterality: Left;  CDE: 6.49  . COLONOSCOPY     times two 2002 ,2007  . COLONOSCOPY N/A 10/07/2013   Procedure: COLONOSCOPY;  Surgeon: Daneil Dolin, MD;  [facility];  Service: Endoscopy;  Laterality: N/A;  8:30 AM  . COLONOSCOPY N/A 10/05/2018   Procedure: COLONOSCOPY;  Surgeon: Daneil Dolin, MD;  [facility];  Service: Endoscopy;  Laterality: N/A;  8:30  . KNEE ARTHROPLASTY Left 06/27/2019   Procedure: COMPUTER ASSISTED TOTAL KNEE ARTHROPLASTY;  Surgeon: Rod Can, MD;  [facility];  Service: Orthopedics;  Laterality: Left;  . LEG SURGERY Right early 33's  . NO PAST SURGERIES      There were no vitals filed for this visit.  Subjective Assessment - 07/08/19  0816    Subjective  COVID-19 screening performed upon arrival. Patient reported doing well after last treatment and some ongoing soreness today    Pertinent History  left TKA 06/27/2019    Limitations  Sitting;Standing;Walking;House hold activities    How long can you walk comfortably?  room to room    Diagnostic tests  x-ray    Patient Stated Goals  decrease pain, return to normal activities.    Currently in Pain?  Yes    Pain Score  2     Pain Location  Knee    Pain Orientation  Left    Pain Descriptors / Indicators  Discomfort    Pain Type  Surgical pain    Pain Onset  1 to 4 weeks ago    Pain Frequency  Constant    Aggravating Factors   prolong activitiy with left LE    Pain Relieving Factors  at rest                       [facility] Adult PT Treatment/Exercise - 07/08/19 0001      Knee/Hip Exercises: Stretches   Knee: Self-Stretch to increase Flexion  3 reps;Left;20 seconds      Knee/Hip Exercises: Aerobic   Nustep  71min L4 seat 10      Knee/Hip Exercises: Standing   Forward Step Up  Left;Step Height: 6";Hand Hold: 1;5  reps;20 reps    Rocker Board  3 minutes      Knee/Hip Exercises: Seated   Long Arc Quad  Strengthening;Left;3 sets;10 reps;Weights    Long Arc Quad Weight  2 lbs.      Knee/Hip Exercises: Supine   Short Arc Quad Sets  Strengthening;Left;3 sets;10 reps    Short Arc Quad Sets Limitations  2#    Straight Leg Raises  AROM;Left;20 reps;10 reps    Other Supine Knee/Hip Exercises  hip abd lt LE w red t-band x30      Vasopneumatic   Number Minutes Vasopneumatic   10 minutes    Vasopnuematic Location   Knee    Vasopneumatic Pressure  Low    Vasopneumatic Temperature   34               PT Short Term Goals - 07/05/19 0851      PT SHORT TERM GOAL #1   Title  Patient will be independent with initial HEP    Time  3    Period  Weeks    Status  Achieved      PT SHORT TERM GOAL #2   Title  Patient will demonstrate 90+ degrees of left  knee flexion AROM to improve ability to perform functional tasks.    Time  3    Period  Weeks    Status  Achieved      PT SHORT TERM GOAL #3   Title  Patient will demonstrate 5 degrees or less of left knee extension AROM to improve gait mechanics.    Time  3    Period  Weeks    Status  On-going        PT Long Term Goals - 07/05/19 0851      PT LONG TERM GOAL #1   Title  Patient will be independent with advanced HEP    Time  6    Period  Weeks    Status  On-going      PT LONG TERM GOAL #2   Title  Patient will demonstrate 115+ degrees of left knee flexion AROM to improve ability to perform functional tasks.    Time  6    Period  Weeks    Status  On-going      PT LONG TERM GOAL #3   Title  Patient will demonstrate 3 degrees or less of left knee extension AROM to improve gait mechanics.    Time  6    Period  Weeks    Status  On-going      PT LONG TERM GOAL #4   Title  Patient will report ability to perform ADLs and home activities with left knee pain less than or equal to 3/10.    Time  6    Period  Weeks    Status  On-going      PT LONG TERM GOAL #5   Title  Patient will demonstrate reciprocating stair negotiation with 1 rail and no AD to safely enter and exit home.    Time  6    Period  Weeks    Status  On-going            Plan - 07/08/19 0850    Clinical Impression Statement  Patient tolerated treatment well today. Today focused on gentle strengthening for left knee and educated patient on transition on gait training with cane next treatment due to his progression. Patient has reported minimal soreness in knee and ROM is  WNL for current progress. Patient continues to wear ace bandage and is going to his MD this friday. Goals progressing.    Personal Factors and Comorbidities  Age;Comorbidity 2    Comorbidities  L TKA 06/27/2019; HTN, DM    Examination-Activity Limitations  Stand;Stairs;Sit    Examination-Participation Restrictions  Driving     Stability/Clinical Decision Making  Stable/Uncomplicated    Rehab Potential  Good    PT Frequency  3x / week    PT Duration  6 weeks    PT Treatment/Interventions  ADLs/Self Care Home Management;Cryotherapy;Electrical Stimulation;Iontophoresis 4mg /ml Dexamethasone;Moist Heat;Balance training;Therapeutic exercise;Therapeutic activities;Functional mobility training;Stair training;Gait training;Neuromuscular re-education;Patient/family education;Passive range of motion;Vasopneumatic Device;Taping;Manual techniques    PT Next Visit Plan  Nustep, knee AROM exercises, gait training to progress to ambulation without AD, left knee PROM, modalities PRN for pain relief.    Consulted and Agree with Plan of Care  Patient       Patient will benefit from skilled therapeutic intervention in order to improve the following deficits and impairments:  Decreased activity tolerance, Decreased strength, Decreased range of motion, Difficulty walking, Pain  Visit Diagnosis: Acute pain of left knee  Stiffness of left knee, not elsewhere classified     Problem List Patient Active Problem List   Diagnosis Date Noted  . Osteoarthritis of left knee 06/27/2019    Daris Aristizabal P, PTA 07/08/2019, 9:06 AM  [facility] [place], [place], [address] Phone: [phone]   Fax:  [phone]  Name: Bryan Atkinson MRN: [id number] Date of Birth: January 12, 1956

## 2019-07-10 ENCOUNTER — Ambulatory Visit: Payer: 59 | Admitting: Physical Therapy

## 2019-07-10 ENCOUNTER — Other Ambulatory Visit: Payer: Self-pay

## 2019-07-10 DIAGNOSIS — M25562 Pain in left knee: Secondary | ICD-10-CM

## 2019-07-10 DIAGNOSIS — M25662 Stiffness of left knee, not elsewhere classified: Secondary | ICD-10-CM

## 2019-07-10 NOTE — Therapy (Signed)
[facility] [place], [place], [address] Phone: [phone]   Fax:  [phone]  Physical Therapy Treatment  Patient Details  Name: Bryan Atkinson MRN: [id number] Date of Birth: 10/12/1956 Referring Provider (PT): Rod Can, MD   Encounter Date: 07/10/2019  PT End of Session - 07/10/19 1012    Visit Number  5    Number of Visits  18    Date for PT Re-Evaluation  08/19/19    Authorization Type  FOTO; progress note every 10th visit    PT Start Time  0857    PT Stop Time  0957    PT Time Calculation (min)  60 min    Activity Tolerance  Patient tolerated treatment well    Behavior During Therapy  [facility] for tasks assessed/performed       Past Medical History:  Diagnosis Date  . Arthritis   . Diabetes mellitus without complication ([place])   . Gout   . History of kidney stones   . Hypertension     Past Surgical History:  Procedure Laterality Date  . CATARACT EXTRACTION W/PHACO Right 05/13/2019   Procedure: CATARACT EXTRACTION PHACO AND INTRAOCULAR LENS PLACEMENT (IOC);  Surgeon: Baruch Goldmann, MD;  Location: [facility];  Service: Ophthalmology;  Laterality: Right;  CDE: 10.31  . CATARACT EXTRACTION W/PHACO Left 05/27/2019   Procedure: CATARACT EXTRACTION PHACO AND INTRAOCULAR LENS PLACEMENT (IOC);  Surgeon: Baruch Goldmann, MD;  Location: [facility];  Service: Ophthalmology;  Laterality: Left;  CDE: 6.49  . COLONOSCOPY     times two 2002 ,2007  . COLONOSCOPY N/A 10/07/2013   Procedure: COLONOSCOPY;  Surgeon: Daneil Dolin, MD;  [facility];  Service: Endoscopy;  Laterality: N/A;  8:30 AM  . COLONOSCOPY N/A 10/05/2018   Procedure: COLONOSCOPY;  Surgeon: Daneil Dolin, MD;  [facility];  Service: Endoscopy;  Laterality: N/A;  8:30  . KNEE ARTHROPLASTY Left 06/27/2019   Procedure: COMPUTER ASSISTED TOTAL KNEE ARTHROPLASTY;  Surgeon: Rod Can, MD;  Location: [facility];  Service: Orthopedics;   Laterality: Left;  . LEG SURGERY Right early 78's  . NO PAST SURGERIES      There were no vitals filed for this visit.  Subjective Assessment - 07/10/19 0931    Subjective  COVID-19 screen performed prior to patient entering clinic.  My pain is about a 2.         [facility] PT Assessment - 07/10/19 0001      AROM   AROM Assessment Site  --   LEFT KNEE   Right/Left Knee  Left    Left Knee Extension  5    Left Knee Flexion  110                   [facility] Adult PT Treatment/Exercise - 07/10/19 0001      Exercises   Exercises  Knee/Hip      Knee/Hip Exercises: Aerobic   Nustep  15 minutes       Knee/Hip Exercises: Standing   Other Standing Knee Exercises  Rockerboard x 4 minutes.      Knee/Hip Exercises: Supine   Short Arc Quad Sets Limitations  SAQ's x 15 minutes facilitated with VMS to left quads (10 sec extension holds and 10 sec rest).      Modalities   Modalities  Vasopneumatic      Vasopneumatic   Number Minutes Vasopneumatic   18 minutes    Vasopnuematic Location   --  Left knee.   Vasopneumatic Pressure  Medium               PT Short Term Goals - 07/05/19 0851      PT SHORT TERM GOAL #1   Title  Patient will be independent with initial HEP    Time  3    Period  Weeks    Status  Achieved      PT SHORT TERM GOAL #2   Title  Patient will demonstrate 90+ degrees of left knee flexion AROM to improve ability to perform functional tasks.    Time  3    Period  Weeks    Status  Achieved      PT SHORT TERM GOAL #3   Title  Patient will demonstrate 5 degrees or less of left knee extension AROM to improve gait mechanics.    Time  3    Period  Weeks    Status  On-going        PT Long Term Goals - 07/05/19 0851      PT LONG TERM GOAL #1   Title  Patient will be independent with advanced HEP    Time  6    Period  Weeks    Status  On-going      PT LONG TERM GOAL #2   Title  Patient will demonstrate 115+ degrees of left knee flexion AROM  to improve ability to perform functional tasks.    Time  6    Period  Weeks    Status  On-going      PT LONG TERM GOAL #3   Title  Patient will demonstrate 3 degrees or less of left knee extension AROM to improve gait mechanics.    Time  6    Period  Weeks    Status  On-going      PT LONG TERM GOAL #4   Title  Patient will report ability to perform ADLs and home activities with left knee pain less than or equal to 3/10.    Time  6    Period  Weeks    Status  On-going      PT LONG TERM GOAL #5   Title  Patient will demonstrate reciprocating stair negotiation with 1 rail and no AD to safely enter and exit home.    Time  6    Period  Weeks    Status  On-going            Plan - 07/10/19 [id number]    Clinical Impression Statement  The patient is making excellent progress with active left range of motion as follows:  -5 to 110 degrees.  He safely walked in the clinic today with a straight cane.    Personal Factors and Comorbidities  Age;Comorbidity 2    Comorbidities  L TKA 06/27/2019; HTN, DM    Examination-Activity Limitations  Stand;Stairs;Sit    Examination-Participation Restrictions  Driving    Stability/Clinical Decision Making  Stable/Uncomplicated    Rehab Potential  Good    PT Frequency  3x / week    PT Duration  6 weeks    PT Treatment/Interventions  ADLs/Self Care Home Management;Cryotherapy;Electrical Stimulation;Iontophoresis 4mg /ml Dexamethasone;Moist Heat;Balance training;Therapeutic exercise;Therapeutic activities;Functional mobility training;Stair training;Gait training;Neuromuscular re-education;Patient/family education;Passive range of motion;Vasopneumatic Device;Taping;Manual techniques    PT Next Visit Plan  Nustep, knee AROM exercises, gait training to progress to ambulation without AD, left knee PROM, modalities PRN for pain relief.    PT Home Exercise Plan  see patient education section    Consulted and Agree with Plan of Care  Patient       Patient will  benefit from skilled therapeutic intervention in order to improve the following deficits and impairments:  Decreased activity tolerance, Decreased strength, Decreased range of motion, Difficulty walking, Pain  Visit Diagnosis: Acute pain of left knee  Stiffness of left knee, not elsewhere classified     Problem List Patient Active Problem List   Diagnosis Date Noted  . Osteoarthritis of left knee 06/27/2019    Kyeisha Janowicz, [place] MPT 07/10/2019, 10:23 AM  [facility] [address] [place], [place], [address] Phone: [phone]   Fax:  [phone]  Name: Amin Dorrough MRN: [id number] Date of Birth: July 01, 1956

## 2019-07-12 ENCOUNTER — Ambulatory Visit: Payer: 59 | Admitting: Physical Therapy

## 2019-07-12 ENCOUNTER — Encounter: Payer: Self-pay | Admitting: Physical Therapy

## 2019-07-12 ENCOUNTER — Other Ambulatory Visit: Payer: Self-pay

## 2019-07-12 DIAGNOSIS — M25662 Stiffness of left knee, not elsewhere classified: Secondary | ICD-10-CM

## 2019-07-12 DIAGNOSIS — M25562 Pain in left knee: Secondary | ICD-10-CM

## 2019-07-12 NOTE — Therapy (Signed)
[facility] [place], [place], [address] Phone: [phone]   Fax:  [phone]  Physical Therapy Treatment  Patient Details  Name: Bryan Atkinson MRN: [id number] Date of Birth: 23-Oct-1955 Referring Provider (PT): Rod Can, MD   Encounter Date: 07/12/2019  PT End of Session - 07/12/19 0859    Visit Number  6    Number of Visits  18    Date for PT Re-Evaluation  08/19/19    Authorization Type  FOTO; progress note every 10th visit    PT Start Time  0815    PT Stop Time  0904    PT Time Calculation (min)  49 min    Equipment Utilized During Treatment  Other (comment)   SPC   Activity Tolerance  Patient tolerated treatment well    Behavior During Therapy  [facility] for tasks assessed/performed       Past Medical History:  Diagnosis Date  . Arthritis   . Diabetes mellitus without complication ([place])   . Gout   . History of kidney stones   . Hypertension     Past Surgical History:  Procedure Laterality Date  . CATARACT EXTRACTION W/PHACO Right 05/13/2019   Procedure: CATARACT EXTRACTION PHACO AND INTRAOCULAR LENS PLACEMENT (IOC);  Surgeon: Baruch Goldmann, MD;  [facility]: [facility];  Service: Ophthalmology;  Laterality: Right;  CDE: 10.31  . CATARACT EXTRACTION W/PHACO Left 05/27/2019   Procedure: CATARACT EXTRACTION PHACO AND INTRAOCULAR LENS PLACEMENT (IOC);  Surgeon: Baruch Goldmann, MD;  Location: [facility];  Service: Ophthalmology;  Laterality: Left;  CDE: 6.49  . COLONOSCOPY     times two 2002 ,2007  . COLONOSCOPY N/A 10/07/2013   Procedure: COLONOSCOPY;  Surgeon: Daneil Dolin, MD;  [facility];  Service: Endoscopy;  Laterality: N/A;  8:30 AM  . COLONOSCOPY N/A 10/05/2018   Procedure: COLONOSCOPY;  Surgeon: Daneil Dolin, MD;  [facility];  Service: Endoscopy;  Laterality: N/A;  8:30  . KNEE ARTHROPLASTY Left 06/27/2019   Procedure: COMPUTER ASSISTED TOTAL KNEE ARTHROPLASTY;  Surgeon:  Rod Can, MD;  [facility];  Service: Orthopedics;  Laterality: Left;  . LEG SURGERY Right early 79's  . NO PAST SURGERIES      There were no vitals filed for this visit.  Subjective Assessment - 07/12/19 0834    Subjective  COVID-19 screen performed prior to patient entering clinic. Patient reports feeling good but with increase pain 3-4/10.    Patient is accompained by:  Family member    Pertinent History  left TKA 06/27/2019    Limitations  Sitting;Standing;Walking;House hold activities    How long can you walk comfortably?  room to room    Diagnostic tests  x-ray    Patient Stated Goals  decrease pain, return to normal activities.    Currently in Pain?  Yes    Pain Score  4     Pain Location  Knee    Pain Orientation  Left    Pain Type  Surgical pain    Pain Onset  1 to 4 weeks ago    Pain Frequency  Constant         [facility] PT Assessment - 07/12/19 0001      Assessment   Medical Diagnosis  osteoarthritis of left knee    Referring Provider (PT)  Rod Can, MD    Onset Date/Surgical Date  06/27/19    Next MD Visit  07/12/2019    Prior Therapy  in hospital      Precautions   Precautions  Other (comment)    Precaution Comments  no ultrasound                   [facility] Adult PT Treatment/Exercise - 07/12/19 0001      Exercises   Exercises  Knee/Hip      Knee/Hip Exercises: Stretches   Passive Hamstring Stretch  Left;3 reps;30 seconds      Knee/Hip Exercises: Aerobic   Nustep  Level 4 x15 mins seat 10-8      Knee/Hip Exercises: Standing   Hip Flexion  AROM;Both;20 reps;Knee bent    Hip Abduction  AROM;Both;20 reps;Knee straight    Rocker Board  3 minutes      Knee/Hip Exercises: Supine   Heel Prop for Knee Extension  3 minutes      Modalities   Modalities  Vasopneumatic      Vasopneumatic   Number Minutes Vasopneumatic   10 minutes    Vasopnuematic Location   Knee    Vasopneumatic Pressure  Low               PT Short  Term Goals - 07/05/19 0851      PT SHORT TERM GOAL #1   Title  Patient will be independent with initial HEP    Time  3    Period  Weeks    Status  Achieved      PT SHORT TERM GOAL #2   Title  Patient will demonstrate 90+ degrees of left knee flexion AROM to improve ability to perform functional tasks.    Time  3    Period  Weeks    Status  Achieved      PT SHORT TERM GOAL #3   Title  Patient will demonstrate 5 degrees or less of left knee extension AROM to improve gait mechanics.    Time  3    Period  Weeks    Status  On-going        PT Long Term Goals - 07/05/19 0851      PT LONG TERM GOAL #1   Title  Patient will be independent with advanced HEP    Time  6    Period  Weeks    Status  On-going      PT LONG TERM GOAL #2   Title  Patient will demonstrate 115+ degrees of left knee flexion AROM to improve ability to perform functional tasks.    Time  6    Period  Weeks    Status  On-going      PT LONG TERM GOAL #3   Title  Patient will demonstrate 3 degrees or less of left knee extension AROM to improve gait mechanics.    Time  6    Period  Weeks    Status  On-going      PT LONG TERM GOAL #4   Title  Patient will report ability to perform ADLs and home activities with left knee pain less than or equal to 3/10.    Time  6    Period  Weeks    Status  On-going      PT LONG TERM GOAL #5   Title  Patient will demonstrate reciprocating stair negotiation with 1 rail and no AD to safely enter and exit home.    Time  6    Period  Weeks    Status  On-going  Plan - 07/12/19 0900    Clinical Impression Statement  Patient responded well to therapy with no reports of increased pain. Patient required intermittent cuing for proper form with hip abduction to prevent hip ER. Patient denied discomfort with heel prop. Patient to see MD today, MD note sent last visit. Normal response upon removal of modalities.    Personal Factors and Comorbidities  Age;Comorbidity 2     Comorbidities  L TKA 06/27/2019; HTN, DM    Examination-Activity Limitations  Stand;Stairs;Sit    Examination-Participation Restrictions  Driving    Clinical Decision Making  Low    Rehab Potential  Good    PT Frequency  3x / week    PT Duration  6 weeks    PT Treatment/Interventions  ADLs/Self Care Home Management;Cryotherapy;Electrical Stimulation;Iontophoresis 4mg /ml Dexamethasone;Moist Heat;Balance training;Therapeutic exercise;Therapeutic activities;Functional mobility training;Stair training;Gait training;Neuromuscular re-education;Patient/family education;Passive range of motion;Vasopneumatic Device;Taping;Manual techniques    PT Next Visit Plan  Cont.  plan of care, nustep, attempt bike, left knee strengthening, modalities PRN for pain relief.    PT Home Exercise Plan  see patient education section    Consulted and Agree with Plan of Care  Patient       Patient will benefit from skilled therapeutic intervention in order to improve the following deficits and impairments:  Decreased activity tolerance, Decreased strength, Decreased range of motion, Difficulty walking, Pain  Visit Diagnosis: Acute pain of left knee  Stiffness of left knee, not elsewhere classified     Problem List Patient Active Problem List   Diagnosis Date Noted  . Osteoarthritis of left knee 06/27/2019    Gabriela Eves, PT, DPT 07/12/2019, 9:11 AM  [facility] [address] [place], [place], [address] Phone: [phone]   Fax:  [phone]  Name: Bryan Atkinson MRN: [id number] Date of Birth: 1956/02/17

## 2019-07-15 ENCOUNTER — Encounter: Payer: Self-pay | Admitting: Physical Therapy

## 2019-07-15 ENCOUNTER — Other Ambulatory Visit: Payer: Self-pay

## 2019-07-15 ENCOUNTER — Ambulatory Visit: Payer: 59 | Admitting: Physical Therapy

## 2019-07-15 DIAGNOSIS — M25562 Pain in left knee: Secondary | ICD-10-CM

## 2019-07-15 DIAGNOSIS — M25662 Stiffness of left knee, not elsewhere classified: Secondary | ICD-10-CM

## 2019-07-15 NOTE — Therapy (Signed)
[facility] [place], [place], [address] Phone: [phone]   Fax:  [phone]  Physical Therapy Treatment  Patient Details  Name: Bryan Atkinson MRN: [id number] Date of Birth: 03/26/1956 Referring Provider (PT): Rod Can, MD   Encounter Date: 07/15/2019  PT End of Session - 07/15/19 0823    Visit Number  7    Number of Visits  18    Date for PT Re-Evaluation  08/19/19    Authorization Type  FOTO; progress note every 10th visit    PT Start Time  0820    PT Stop Time  0908    PT Time Calculation (min)  48 min    Activity Tolerance  Patient tolerated treatment well    Behavior During Therapy  [facility] for tasks assessed/performed       Past Medical History:  Diagnosis Date  . Arthritis   . Diabetes mellitus without complication ([place])   . Gout   . History of kidney stones   . Hypertension     Past Surgical History:  Procedure Laterality Date  . CATARACT EXTRACTION W/PHACO Right 05/13/2019   Procedure: CATARACT EXTRACTION PHACO AND INTRAOCULAR LENS PLACEMENT (IOC);  Surgeon: Baruch Goldmann, MD;  Location: [facility];  Service: Ophthalmology;  Laterality: Right;  CDE: 10.31  . CATARACT EXTRACTION W/PHACO Left 05/27/2019   Procedure: CATARACT EXTRACTION PHACO AND INTRAOCULAR LENS PLACEMENT (IOC);  Surgeon: Baruch Goldmann, MD;  Location: [facility];  Service: Ophthalmology;  Laterality: Left;  CDE: 6.49  . COLONOSCOPY     times two 2002 ,2007  . COLONOSCOPY N/A 10/07/2013   Procedure: COLONOSCOPY;  Surgeon: Daneil Dolin, MD;  [facility];  Service: Endoscopy;  Laterality: N/A;  8:30 AM  . COLONOSCOPY N/A 10/05/2018   Procedure: COLONOSCOPY;  Surgeon: Daneil Dolin, MD;  [facility];  Service: Endoscopy;  Laterality: N/A;  8:30  . KNEE ARTHROPLASTY Left 06/27/2019   Procedure: COMPUTER ASSISTED TOTAL KNEE ARTHROPLASTY;  Surgeon: Rod Can, MD;  Location: [facility];  Service: Orthopedics;   Laterality: Left;  . LEG SURGERY Right early 63's  . NO PAST SURGERIES      There were no vitals filed for this visit.  Subjective Assessment - 07/15/19 0820    Subjective  COVID-19 screen performed prior to patient entering clinic. Arrived with no AD and minimal pain.    Pertinent History  left TKA 06/27/2019    Limitations  Sitting;Standing;Walking;House hold activities    How long can you walk comfortably?  room to room    Diagnostic tests  x-ray    Patient Stated Goals  decrease pain, return to normal activities.    Currently in Pain?  Yes    Pain Score  3     Pain Location  Knee    Pain Orientation  Left    Pain Descriptors / Indicators  Discomfort    Pain Type  Surgical pain    Pain Onset  More than a month ago    Pain Frequency  Constant         [facility] PT Assessment - 07/15/19 0001      Assessment   Medical Diagnosis  osteoarthritis of left knee    Referring Provider (PT)  Rod Can, MD    Onset Date/Surgical Date  06/27/19    Next MD Visit  07/12/2019    Prior Therapy  in hospital      Precautions   Precautions  Other (comment)  Precaution Comments  no ultrasound                   [facility] Adult PT Treatment/Exercise - 07/15/19 0001      Knee/Hip Exercises: Aerobic   Nustep  Level 4 x17 min      Knee/Hip Exercises: Machines for Strengthening   Cybex Knee Extension  10# 2x10 reps    Cybex Knee Flexion  30# 2x10 reps      Knee/Hip Exercises: Standing   Forward Lunges  Left;20 reps;2 seconds    Hip Abduction  AROM;Both;20 reps;Knee straight    Lateral Step Up  Left;20 reps;Hand Hold: 2;Step Height: 6"    Forward Step Up  Left;20 reps;Hand Hold: 2;Step Height: 6"      Modalities   Modalities  Psychologist, educational Location  L knee    Electrical Stimulation Action  Pre-Mod    Electrical Stimulation Parameters  80-150 hz x15 min    Electrical Stimulation Goals  Pain;Edema       Vasopneumatic   Number Minutes Vasopneumatic   15 minutes    Vasopnuematic Location   Knee    Vasopneumatic Pressure  Low    Vasopneumatic Temperature   34               PT Short Term Goals - 07/05/19 [id number]      PT SHORT TERM GOAL #1   Title  Patient will be independent with initial HEP    Time  3    Period  Weeks    Status  Achieved      PT SHORT TERM GOAL #2   Title  Patient will demonstrate 90+ degrees of left knee flexion AROM to improve ability to perform functional tasks.    Time  3    Period  Weeks    Status  Achieved      PT SHORT TERM GOAL #3   Title  Patient will demonstrate 5 degrees or less of left knee extension AROM to improve gait mechanics.    Time  3    Period  Weeks    Status  On-going        PT Long Term Goals - 07/05/19 0851      PT LONG TERM GOAL #1   Title  Patient will be independent with advanced HEP    Time  6    Period  Weeks    Status  On-going      PT LONG TERM GOAL #2   Title  Patient will demonstrate 115+ degrees of left knee flexion AROM to improve ability to perform functional tasks.    Time  6    Period  Weeks    Status  On-going      PT LONG TERM GOAL #3   Title  Patient will demonstrate 3 degrees or less of left knee extension AROM to improve gait mechanics.    Time  6    Period  Weeks    Status  On-going      PT LONG TERM GOAL #4   Title  Patient will report ability to perform ADLs and home activities with left knee pain less than or equal to 3/10.    Time  6    Period  Weeks    Status  On-going      PT LONG TERM GOAL #5   Title  Patient will demonstrate reciprocating stair negotiation with 1  rail and no AD to safely enter and exit home.    Time  6    Period  Weeks    Status  On-going            Plan - 07/15/19 [id number]    Clinical Impression Statement  Patient presented in clinic with only minimal complaints of pain and with no AD. Patient's MD very pleased with his progress per patient report.  Patient guided through step ups with concentric and eccentric strengthening. Machine knee strengthening initiated with no complaints from patient. Patient educated that soreness may present following machine strengthening. Edema present around anterior knee. Only minimal antlagic gait deviation noted during ambulation without AD at this time. Normal modalities response noted following removal of the modalities.    Personal Factors and Comorbidities  Age;Comorbidity 2    Comorbidities  L TKA 06/27/2019; HTN, DM    Examination-Activity Limitations  Stand;Stairs;Sit    Examination-Participation Restrictions  Driving    Stability/Clinical Decision Making  Stable/Uncomplicated    Rehab Potential  Good    PT Frequency  3x / week    PT Duration  6 weeks    PT Treatment/Interventions  ADLs/Self Care Home Management;Cryotherapy;Electrical Stimulation;Iontophoresis 4mg /ml Dexamethasone;Moist Heat;Balance training;Therapeutic exercise;Therapeutic activities;Functional mobility training;Stair training;Gait training;Neuromuscular re-education;Patient/family education;Passive range of motion;Vasopneumatic Device;Taping;Manual techniques    PT Next Visit Plan  Cont.  plan of care, nustep, attempt bike, left knee strengthening, modalities PRN for pain relief.    PT Home Exercise Plan  see patient education section    Consulted and Agree with Plan of Care  Patient       Patient will benefit from skilled therapeutic intervention in order to improve the following deficits and impairments:  Decreased activity tolerance, Decreased strength, Decreased range of motion, Difficulty walking, Pain  Visit Diagnosis: Acute pain of left knee  Stiffness of left knee, not elsewhere classified     Problem List Patient Active Problem List   Diagnosis Date Noted  . Osteoarthritis of left knee 06/27/2019    Standley Brooking, PTA 07/15/2019, 9:45 AM  [facility] [address] [place], [place], [address] Phone: [phone]   Fax:  [phone]  Name: Bryan Atkinson MRN: [id number] Date of Birth: 1956/04/25

## 2019-07-17 ENCOUNTER — Other Ambulatory Visit: Payer: Self-pay

## 2019-07-17 ENCOUNTER — Encounter: Payer: Self-pay | Admitting: Physical Therapy

## 2019-07-17 ENCOUNTER — Ambulatory Visit: Payer: 59 | Admitting: Physical Therapy

## 2019-07-17 DIAGNOSIS — M25662 Stiffness of left knee, not elsewhere classified: Secondary | ICD-10-CM

## 2019-07-17 DIAGNOSIS — M25562 Pain in left knee: Secondary | ICD-10-CM | POA: Diagnosis not present

## 2019-07-17 NOTE — Therapy (Signed)
[facility] [place], [place], [address] Phone: [phone]   Fax:  [phone]  Physical Therapy Treatment  Patient Details  Name: Bryan Atkinson MRN: [id number] Date of Birth: 09-19-1956 Referring Provider (PT): Rod Can, MD   Encounter Date: 07/17/2019  PT End of Session - 07/17/19 0817    Visit Number  8    Number of Visits  18    Date for PT Re-Evaluation  08/19/19    Authorization Type  FOTO; progress note every 10th visit    PT Start Time  0730    PT Stop Time  0826    PT Time Calculation (min)  56 min    Activity Tolerance  Patient tolerated treatment well    Behavior During Therapy  [facility] for tasks assessed/performed       Past Medical History:  Diagnosis Date  . Arthritis   . Diabetes mellitus without complication ([place])   . Gout   . History of kidney stones   . Hypertension     Past Surgical History:  Procedure Laterality Date  . CATARACT EXTRACTION W/PHACO Right 05/13/2019   Procedure: CATARACT EXTRACTION PHACO AND INTRAOCULAR LENS PLACEMENT (IOC);  Surgeon: Baruch Goldmann, MD;  Location: [facility];  Service: Ophthalmology;  Laterality: Right;  CDE: 10.31  . CATARACT EXTRACTION W/PHACO Left 05/27/2019   Procedure: CATARACT EXTRACTION PHACO AND INTRAOCULAR LENS PLACEMENT (IOC);  Surgeon: Baruch Goldmann, MD;  Location: [facility];  Service: Ophthalmology;  Laterality: Left;  CDE: 6.49  . COLONOSCOPY     times two 2002 ,2007  . COLONOSCOPY N/A 10/07/2013   Procedure: COLONOSCOPY;  Surgeon: Daneil Dolin, MD;  [facility];  Service: Endoscopy;  Laterality: N/A;  8:30 AM  . COLONOSCOPY N/A 10/05/2018   Procedure: COLONOSCOPY;  Surgeon: Daneil Dolin, MD;  [facility];  Service: Endoscopy;  Laterality: N/A;  8:30  . KNEE ARTHROPLASTY Left 06/27/2019   Procedure: COMPUTER ASSISTED TOTAL KNEE ARTHROPLASTY;  Surgeon: Rod Can, MD;  Location: [facility];  Service: Orthopedics;   Laterality: Left;  . LEG SURGERY Right early 22's  . NO PAST SURGERIES      There were no vitals filed for this visit.  Subjective Assessment - 07/17/19 0732    Subjective  COVID-19 screen performed prior to patient entering clinic. Doing well upon arrival with some discomfort    Pertinent History  left TKA 06/27/2019    Limitations  Sitting;Standing;Walking;House hold activities    How long can you walk comfortably?  room to room    Diagnostic tests  x-ray    Patient Stated Goals  decrease pain, return to normal activities.    Currently in Pain?  Yes    Pain Score  2     Pain Location  Knee    Pain Orientation  Left    Pain Descriptors / Indicators  Discomfort    Pain Type  Surgical pain    Pain Onset  More than a month ago    Pain Frequency  Constant    Aggravating Factors   prolong activity    Pain Relieving Factors  at rest         [facility] PT Assessment - 07/17/19 0001      AROM   AROM Assessment Site  Knee    Right/Left Knee  Left    Left Knee Extension  -9    Left Knee Flexion  110      PROM  PROM Assessment Site  Knee    Right/Left Knee  Left    Left Knee Extension  -5    Left Knee Flexion  116                   [facility] Adult PT Treatment/Exercise - 07/17/19 0001      Knee/Hip Exercises: Aerobic   Stationary Bike  L1 x10 seat 10-7 for ROM      Knee/Hip Exercises: Machines for Strengthening   Cybex Knee Extension  10# 3x10 reps    Cybex Knee Flexion  30# 3x10 reps      Knee/Hip Exercises: Standing   Lateral Step Up  Left;Step Height: 6";3 sets;10 reps    Forward Step Up  Left;3 sets;10 reps;Step Height: 6"    Rocker Board  2 minutes      Knee/Hip Exercises: Supine   Adult nurse  L knee    Chartered certified accountant  premod    Printmaker Parameters  80-150hz  x53min    Electrical Stimulation Goals  Pain;Edema      Vasopneumatic   Number Minutes  Vasopneumatic   15 minutes    Vasopnuematic Location   Knee    Vasopneumatic Pressure  Low    Vasopneumatic Temperature   34      Manual Therapy   Manual Therapy  Passive ROM    Passive ROM  PROM to left knee into flexion and extension to improve ROM               PT Short Term Goals - 07/17/19 [id number]      PT SHORT TERM GOAL #1   Title  Patient will be independent with initial HEP    Time  3    Period  Weeks    Status  Achieved      PT SHORT TERM GOAL #2   Title  Patient will demonstrate 90+ degrees of left knee flexion AROM to improve ability to perform functional tasks.    Time  3    Period  Weeks      PT SHORT TERM GOAL #3   Title  Patient will demonstrate 5 degrees or less of left knee extension AROM to improve gait mechanics.    Time  3    Period  Weeks    Status  On-going   AROM -9 degrees 07/17/19       PT Long Term Goals - 07/17/19 0815      PT LONG TERM GOAL #1   Title  Patient will be independent with advanced HEP    Time  6    Period  Weeks    Status  On-going      PT LONG TERM GOAL #2   Title  Patient will demonstrate 115+ degrees of left knee flexion AROM to improve ability to perform functional tasks.    Period  Weeks    Status  On-going   AROM 110 degrees 07/17/19     PT LONG TERM GOAL #3   Title  Patient will demonstrate 3 degrees or less of left knee extension AROM to improve gait mechanics.    Period  Weeks    Status  On-going   AROM -9 degrees 07/17/19     PT LONG TERM GOAL #4   Title  Patient will report ability to perform ADLs and home activities with left knee pain less than or equal  to 3/10.    Time  6    Period  Weeks    Status  On-going      PT LONG TERM GOAL #5   Title  Patient will demonstrate reciprocating stair negotiation with 1 rail and no AD to safely enter and exit home.    Time  6    Period  Weeks    Status  On-going            Plan - 07/17/19 0818    Clinical Impression Statement  Patient tolerated  trewatment well today. Patient progressing with all strengthening exercises and ROM activities. Patient has little discomfort overall and doing HEP daily. Patient progressing toward goals today with ongoing ROM, strength and pain limitations.    Personal Factors and Comorbidities  Age;Comorbidity 2    Comorbidities  L TKA 06/27/2019; HTN, DM    Examination-Activity Limitations  Stand;Stairs;Sit    Examination-Participation Restrictions  Driving    Stability/Clinical Decision Making  Stable/Uncomplicated    Rehab Potential  Good    PT Frequency  3x / week    PT Duration  6 weeks    PT Treatment/Interventions  ADLs/Self Care Home Management;Cryotherapy;Electrical Stimulation;Iontophoresis 4mg /ml Dexamethasone;Moist Heat;Balance training;Therapeutic exercise;Therapeutic activities;Functional mobility training;Stair training;Gait training;Neuromuscular re-education;Patient/family education;Passive range of motion;Vasopneumatic Device;Taping;Manual techniques    PT Next Visit Plan  Cont with POC gor ROM and  left knee strengthening, modalities PRN for pain relief.    Consulted and Agree with Plan of Care  Patient       Patient will benefit from skilled therapeutic intervention in order to improve the following deficits and impairments:  Decreased activity tolerance, Decreased strength, Decreased range of motion, Difficulty walking, Pain  Visit Diagnosis: Acute pain of left knee  Stiffness of left knee, not elsewhere classified     Problem List Patient Active Problem List   Diagnosis Date Noted  . Osteoarthritis of left knee 06/27/2019    Lavell Ridings P, PTA 07/17/2019, 8:40 AM  [facility] [place], [place], [address] Phone: [phone]   Fax:  [phone]  Name: Bryan Atkinson MRN: [id number] Date of Birth: Feb 21, 1956

## 2019-07-19 ENCOUNTER — Encounter: Payer: Self-pay | Admitting: Physical Therapy

## 2019-07-19 ENCOUNTER — Other Ambulatory Visit: Payer: Self-pay

## 2019-07-19 ENCOUNTER — Ambulatory Visit: Payer: 59 | Attending: Orthopedic Surgery | Admitting: Physical Therapy

## 2019-07-19 DIAGNOSIS — M25562 Pain in left knee: Secondary | ICD-10-CM | POA: Insufficient documentation

## 2019-07-19 DIAGNOSIS — M25662 Stiffness of left knee, not elsewhere classified: Secondary | ICD-10-CM | POA: Diagnosis present

## 2019-07-19 NOTE — Therapy (Signed)
[facility] [place], [place], [address] Phone: [phone]   Fax:  [phone]  Physical Therapy Treatment  Patient Details  Name: Bryan Atkinson MRN: [id number] Date of Birth: 03/03/1956 Referring Provider (PT): Rod Can, MD   Encounter Date: 07/19/2019  PT End of Session - 07/19/19 0828    Visit Number  9    Number of Visits  18    Date for PT Re-Evaluation  08/19/19    Authorization Type  FOTO; progress note every 10th visit    PT Start Time  0815    PT Stop Time  0900    PT Time Calculation (min)  45 min    Activity Tolerance  Patient tolerated treatment well    Behavior During Therapy  [facility] for tasks assessed/performed       Past Medical History:  Diagnosis Date  . Arthritis   . Diabetes mellitus without complication ([place])   . Gout   . History of kidney stones   . Hypertension     Past Surgical History:  Procedure Laterality Date  . CATARACT EXTRACTION W/PHACO Right 05/13/2019   Procedure: CATARACT EXTRACTION PHACO AND INTRAOCULAR LENS PLACEMENT (IOC);  Surgeon: Baruch Goldmann, MD;  [facility]: [facility];  Service: Ophthalmology;  Laterality: Right;  CDE: 10.31  . CATARACT EXTRACTION W/PHACO Left 05/27/2019   Procedure: CATARACT EXTRACTION PHACO AND INTRAOCULAR LENS PLACEMENT (IOC);  Surgeon: Baruch Goldmann, MD;  [facility]: [facility];  Service: Ophthalmology;  Laterality: Left;  CDE: 6.49  . COLONOSCOPY     times two 2002 ,2007  . COLONOSCOPY N/A 10/07/2013   Procedure: COLONOSCOPY;  Surgeon: Daneil Dolin, MD;  [facility];  Service: Endoscopy;  Laterality: N/A;  8:30 AM  . COLONOSCOPY N/A 10/05/2018   Procedure: COLONOSCOPY;  Surgeon: Daneil Dolin, MD;  [facility];  Service: Endoscopy;  Laterality: N/A;  8:30  . KNEE ARTHROPLASTY Left 06/27/2019   Procedure: COMPUTER ASSISTED TOTAL KNEE ARTHROPLASTY;  Surgeon: Rod Can, MD;  [facility];  Service: Orthopedics;   Laterality: Left;  . LEG SURGERY Right early 58's  . NO PAST SURGERIES      There were no vitals filed for this visit.  Subjective Assessment - 07/19/19 0823    Subjective  COVID-19 screen performed prior to patient entering clinic. Reports minimal pain this morning.    Pertinent History  left TKA 06/27/2019    Limitations  Sitting;Standing;Walking;House hold activities    How long can you walk comfortably?  room to room    Diagnostic tests  x-ray    Patient Stated Goals  decrease pain, return to normal activities.    Currently in Pain?  Yes    Pain Score  4     Pain Location  Knee    Pain Orientation  Left    Pain Descriptors / Indicators  Discomfort;Sore    Pain Type  Surgical pain    Pain Onset  More than a month ago    Pain Frequency  Constant         [facility] PT Assessment - 07/19/19 0001      Assessment   Medical Diagnosis  osteoarthritis of left knee    Referring Provider (PT)  Rod Can, MD    Onset Date/Surgical Date  06/27/19    Next MD Visit  08/12/2019    Prior Therapy  in hospital      Precautions   Precautions  Other (comment)  Precaution Comments  no ultrasound                   [facility] Adult PT Treatment/Exercise - 07/19/19 0001      Knee/Hip Exercises: Aerobic   Stationary Bike  L3, seat 9 x12 min      Knee/Hip Exercises: Machines for Strengthening   Cybex Knee Extension  10# 3x10 reps    Cybex Knee Flexion  30# 3x10 reps    Cybex Leg Press  3pl, seat 7 x20 reps      Knee/Hip Exercises: Standing   Terminal Knee Extension  Strengthening;Left;20 reps;Theraband    Terminal Knee Extension Limitations  Orange XTS      Modalities   Modalities  Artist  L knee    Chartered certified accountant  IFC    Electrical Stimulation Parameters  80-150 hz x15 min    Electrical Stimulation Goals  Pain;Edema      Vasopneumatic   Number Minutes  Vasopneumatic   15 minutes    Vasopnuematic Location   Knee    Vasopneumatic Pressure  Low    Vasopneumatic Temperature   34               PT Short Term Goals - 07/17/19 [id number]      PT SHORT TERM GOAL #1   Title  Patient will be independent with initial HEP    Time  3    Period  Weeks    Status  Achieved      PT SHORT TERM GOAL #2   Title  Patient will demonstrate 90+ degrees of left knee flexion AROM to improve ability to perform functional tasks.    Time  3    Period  Weeks      PT SHORT TERM GOAL #3   Title  Patient will demonstrate 5 degrees or less of left knee extension AROM to improve gait mechanics.    Time  3    Period  Weeks    Status  On-going   AROM -9 degrees 07/17/19       PT Long Term Goals - 07/17/19 0815      PT LONG TERM GOAL #1   Title  Patient will be independent with advanced HEP    Time  6    Period  Weeks    Status  On-going      PT LONG TERM GOAL #2   Title  Patient will demonstrate 115+ degrees of left knee flexion AROM to improve ability to perform functional tasks.    Period  Weeks    Status  On-going   AROM 110 degrees 07/17/19     PT LONG TERM GOAL #3   Title  Patient will demonstrate 3 degrees or less of left knee extension AROM to improve gait mechanics.    Period  Weeks    Status  On-going   AROM -9 degrees 07/17/19     PT LONG TERM GOAL #4   Title  Patient will report ability to perform ADLs and home activities with left knee pain less than or equal to 3/10.    Time  6    Period  Weeks    Status  On-going      PT LONG TERM GOAL #5   Title  Patient will demonstrate reciprocating stair negotiation with 1 rail and no AD to safely enter and exit home.    Time  6    Period  Weeks    Status  On-going            Plan - 07/19/19 0850    Clinical Impression Statement  Patient presented in clinic with minimal reports of L knee pain. Patient able to complete full revolutions on stationary bike well. Patient guided through  more advanced knee strengthening . Increased edema present in anterior knee and very superfically. No other edema greatly notable in lateral L knee. Normal modalities response noted following removal of the modalities.    Personal Factors and Comorbidities  Age;Comorbidity 2    Comorbidities  L TKA 06/27/2019; HTN, DM    Examination-Activity Limitations  Stand;Stairs;Sit    Examination-Participation Restrictions  Driving    Stability/Clinical Decision Making  Stable/Uncomplicated    Rehab Potential  Good    PT Frequency  3x / week    PT Duration  6 weeks    PT Treatment/Interventions  ADLs/Self Care Home Management;Cryotherapy;Electrical Stimulation;Iontophoresis 4mg /ml Dexamethasone;Moist Heat;Balance training;Therapeutic exercise;Therapeutic activities;Functional mobility training;Stair training;Gait training;Neuromuscular re-education;Patient/family education;Passive range of motion;Vasopneumatic Device;Taping;Manual techniques    PT Next Visit Plan  Cont with POC gor ROM and  left knee strengthening, modalities PRN for pain relief.    PT Home Exercise Plan  see patient education section    Consulted and Agree with Plan of Care  Patient       Patient will benefit from skilled therapeutic intervention in order to improve the following deficits and impairments:  Decreased activity tolerance, Decreased strength, Decreased range of motion, Difficulty walking, Pain  Visit Diagnosis: Acute pain of left knee  Stiffness of left knee, not elsewhere classified     Problem List Patient Active Problem List   Diagnosis Date Noted  . Osteoarthritis of left knee 06/27/2019    Standley Brooking, PTA 07/19/2019, 9:03 AM  [facility] [address] [place], [place], [address] Phone: [phone]   Fax:  [phone]  Name: Bryan Atkinson MRN: [id number] Date of Birth: 12-12-1955

## 2019-07-22 ENCOUNTER — Other Ambulatory Visit: Payer: Self-pay

## 2019-07-22 ENCOUNTER — Ambulatory Visit: Payer: 59 | Admitting: Physical Therapy

## 2019-07-22 DIAGNOSIS — M25562 Pain in left knee: Secondary | ICD-10-CM | POA: Diagnosis not present

## 2019-07-22 DIAGNOSIS — M25662 Stiffness of left knee, not elsewhere classified: Secondary | ICD-10-CM

## 2019-07-22 NOTE — Therapy (Signed)
[facility] [place], [place], [address] Phone: [phone]   Fax:  [phone]  Physical Therapy Treatment  Patient Details  Name: Bryan Atkinson MRN: [id number] Date of Birth: 02/16/1956 Referring Provider (PT): Rod Can, MD   Encounter Date: 07/22/2019  PT End of Session - 07/22/19 0852    Visit Number  10    Number of Visits  18    Date for PT Re-Evaluation  08/19/19    Authorization Type  FOTO; progress note every 10th visit    PT Start Time  0808    Equipment Utilized During Treatment  Other (comment)    Activity Tolerance  Patient tolerated treatment well    Behavior During Therapy  [facility] for tasks assessed/performed       Past Medical History:  Diagnosis Date  . Arthritis   . Diabetes mellitus without complication ([place])   . Gout   . History of kidney stones   . Hypertension     Past Surgical History:  Procedure Laterality Date  . CATARACT EXTRACTION W/PHACO Right 05/13/2019   Procedure: CATARACT EXTRACTION PHACO AND INTRAOCULAR LENS PLACEMENT (IOC);  Surgeon: Baruch Goldmann, MD;  Location: [facility];  Service: Ophthalmology;  Laterality: Right;  CDE: 10.31  . CATARACT EXTRACTION W/PHACO Left 05/27/2019   Procedure: CATARACT EXTRACTION PHACO AND INTRAOCULAR LENS PLACEMENT (IOC);  Surgeon: Baruch Goldmann, MD;  Location: [facility];  Service: Ophthalmology;  Laterality: Left;  CDE: 6.49  . COLONOSCOPY     times two 2002 ,2007  . COLONOSCOPY N/A 10/07/2013   Procedure: COLONOSCOPY;  Surgeon: Daneil Dolin, MD;  [facility];  Service: Endoscopy;  Laterality: N/A;  8:30 AM  . COLONOSCOPY N/A 10/05/2018   Procedure: COLONOSCOPY;  Surgeon: Daneil Dolin, MD;  [facility];  Service: Endoscopy;  Laterality: N/A;  8:30  . KNEE ARTHROPLASTY Left 06/27/2019   Procedure: COMPUTER ASSISTED TOTAL KNEE ARTHROPLASTY;  Surgeon: Rod Can, MD;  [facility];  Service: Orthopedics;   Laterality: Left;  . LEG SURGERY Right early 65's  . NO PAST SURGERIES      There were no vitals filed for this visit.  Subjective Assessment - 07/22/19 0830    Subjective  COVID-19 screen performed prior to patient entering clinic.  I tripped and fell yesterday buy I'm doing okay.    Pertinent History  left TKA 06/27/2019    Limitations  Sitting;Standing;Walking;House hold activities    How long can you walk comfortably?  room to room    Diagnostic tests  x-ray    Patient Stated Goals  decrease pain, return to normal activities.    Currently in Pain?  Yes    Pain Score  2     Pain Location  Knee    Pain Orientation  Left    Pain Descriptors / Indicators  Discomfort;Sore    Pain Type  Surgical pain    Pain Onset  More than a month ago                       [facility] Adult PT Treatment/Exercise - 07/22/19 0001      Exercises   Exercises  Knee/Hip      Knee/Hip Exercises: Aerobic   Stationary Bike  Level 3 x 15 minutes.      Knee/Hip Exercises: Machines for Strengthening   Cybex Knee Extension  10# x 4 minutes.    Cybex Knee Flexion  30# x 4  minutes    Cybex Leg Press  2 1/2 plates x 4 minutes.      Modalities   Modalities  Psychologist, educational Location  Lt knee.    Electrical Stimulation Action  IFC    Electrical Stimulation Parameters  1-10 Hz x 20 minutes.    Electrical Stimulation Goals  Edema;Pain      Vasopneumatic   Number Minutes Vasopneumatic   20 minutes    Vasopnuematic Location   --   Left knee.   Vasopneumatic Pressure  Medium               PT Short Term Goals - 07/17/19 [id number]      PT SHORT TERM GOAL #1   Title  Patient will be independent with initial HEP    Time  3    Period  Weeks    Status  Achieved      PT SHORT TERM GOAL #2   Title  Patient will demonstrate 90+ degrees of left knee flexion AROM to improve ability to perform functional tasks.    Time  3     Period  Weeks      PT SHORT TERM GOAL #3   Title  Patient will demonstrate 5 degrees or less of left knee extension AROM to improve gait mechanics.    Time  3    Period  Weeks    Status  On-going   AROM -9 degrees 07/17/19       PT Long Term Goals - 07/17/19 0815      PT LONG TERM GOAL #1   Title  Patient will be independent with advanced HEP    Time  6    Period  Weeks    Status  On-going      PT LONG TERM GOAL #2   Title  Patient will demonstrate 115+ degrees of left knee flexion AROM to improve ability to perform functional tasks.    Period  Weeks    Status  On-going   AROM 110 degrees 07/17/19     PT LONG TERM GOAL #3   Title  Patient will demonstrate 3 degrees or less of left knee extension AROM to improve gait mechanics.    Period  Weeks    Status  On-going   AROM -9 degrees 07/17/19     PT LONG TERM GOAL #4   Title  Patient will report ability to perform ADLs and home activities with left knee pain less than or equal to 3/10.    Time  6    Period  Weeks    Status  On-going      PT LONG TERM GOAL #5   Title  Patient will demonstrate reciprocating stair negotiation with 1 rail and no AD to safely enter and exit home.    Time  6    Period  Weeks    Status  On-going            Plan - 07/22/19 0857    Clinical Impression Statement  See "Therapy Note" section.    Personal Factors and Comorbidities  Age;Comorbidity 2    Comorbidities  L TKA 06/27/2019; HTN, DM    Examination-Activity Limitations  Stand;Stairs;Sit    Stability/Clinical Decision Making  Stable/Uncomplicated       Patient will benefit from skilled therapeutic intervention in order to improve the following deficits and impairments:  Decreased activity tolerance, Decreased strength, Decreased range  of motion, Difficulty walking, Pain  Visit Diagnosis: Acute pain of left knee  Stiffness of left knee, not elsewhere classified     Problem List Patient Active Problem List   Diagnosis  Date Noted  . Osteoarthritis of left knee 06/27/2019   Progress Note Reporting Period 07/01/19 to 07/22/19  See note below for Objective Data and Assessment of Progress/Goals. Patient is making excellent progress toward goals.  His range of motion has improved dramatically and his pain has ben consistently low.  Patient expected to meet all goals.     Rae Plotner, [place] MPT 07/22/2019, 9:27 AM  [facility] [address] [place], [place], [address] Phone: [phone]   Fax:  [phone]  Name: Bryan Atkinson MRN: [id number] Date of Birth: 1956-08-30

## 2019-07-24 ENCOUNTER — Other Ambulatory Visit: Payer: Self-pay

## 2019-07-24 ENCOUNTER — Ambulatory Visit: Payer: 59 | Admitting: Physical Therapy

## 2019-07-24 DIAGNOSIS — M25562 Pain in left knee: Secondary | ICD-10-CM | POA: Diagnosis not present

## 2019-07-24 DIAGNOSIS — M25662 Stiffness of left knee, not elsewhere classified: Secondary | ICD-10-CM

## 2019-07-24 NOTE — Therapy (Signed)
[facility] [place], [place], [address] Phone: [phone]   Fax:  [phone]  Physical Therapy Treatment  Patient Details  Name: Bryan Atkinson MRN: [id number] Date of Birth: 1956/08/17 Referring Provider (PT): Rod Can, MD   Encounter Date: 07/24/2019  PT End of Session - 07/24/19 0942    Visit Number  11    Number of Visits  18    Date for PT Re-Evaluation  08/19/19    Authorization Type  FOTO; progress note every 10th visit    PT Start Time  0852    PT Stop Time  0948    PT Time Calculation (min)  56 min    Activity Tolerance  Patient tolerated treatment well    Behavior During Therapy  [facility] for tasks assessed/performed       Past Medical History:  Diagnosis Date  . Arthritis   . Diabetes mellitus without complication ([place])   . Gout   . History of kidney stones   . Hypertension     Past Surgical History:  Procedure Laterality Date  . CATARACT EXTRACTION W/PHACO Right 05/13/2019   Procedure: CATARACT EXTRACTION PHACO AND INTRAOCULAR LENS PLACEMENT (IOC);  Surgeon: Baruch Goldmann, MD;  Location: [facility];  Service: Ophthalmology;  Laterality: Right;  CDE: 10.31  . CATARACT EXTRACTION W/PHACO Left 05/27/2019   Procedure: CATARACT EXTRACTION PHACO AND INTRAOCULAR LENS PLACEMENT (IOC);  Surgeon: Baruch Goldmann, MD;  Location: [facility];  Service: Ophthalmology;  Laterality: Left;  CDE: 6.49  . COLONOSCOPY     times two 2002 ,2007  . COLONOSCOPY N/A 10/07/2013   Procedure: COLONOSCOPY;  Surgeon: Daneil Dolin, MD;  Location: [facility];  Service: Endoscopy;  Laterality: N/A;  8:30 AM  . COLONOSCOPY N/A 10/05/2018   Procedure: COLONOSCOPY;  Surgeon: Daneil Dolin, MD;  [facility];  Service: Endoscopy;  Laterality: N/A;  8:30  . KNEE ARTHROPLASTY Left 06/27/2019   Procedure: COMPUTER ASSISTED TOTAL KNEE ARTHROPLASTY;  Surgeon: Rod Can, MD;  Location: [facility];  Service: Orthopedics;   Laterality: Left;  . LEG SURGERY Right early 69's  . NO PAST SURGERIES      There were no vitals filed for this visit.  Subjective Assessment - 07/24/19 0906    Subjective  COVID-19 screen performed prior to patient entering clinic.  I feel some clicking in my knee sometoimes but my pain is low.    Patient is accompained by:  Family member    Pertinent History  left TKA 06/27/2019    Limitations  Sitting;Standing;Walking;House hold activities    How long can you walk comfortably?  room to room    Diagnostic tests  x-ray    Patient Stated Goals  decrease pain, return to normal activities.    Currently in Pain?  Yes    Pain Score  2     Pain Orientation  Left    Pain Descriptors / Indicators  Discomfort;Sore    Pain Type  Surgical pain    Pain Onset  More than a month ago                       [facility] Adult PT Treatment/Exercise - 07/24/19 0001      Exercises   Exercises  Knee/Hip      Knee/Hip Exercises: Aerobic   Stationary Bike  Level 3 x 15 minutes.      Knee/Hip Exercises: Machines for Strengthening   Cybex Knee Extension  10# x 4 minutes.    Cybex Knee Flexion  30# x 4 minutes.    Cybex Leg Press  2 plates x 4 minutes.      Modalities   Modalities  Teacher, English as a foreign language Location  Left knee    Electrical Stimulation Action  IFC    Electrical Stimulation Parameters  80-150 Hz x 20 minutes.      Vasopneumatic   Number Minutes Vasopneumatic   20 minutes    Vasopnuematic Location   --   Left knee.   Vasopneumatic Pressure  Medium               PT Short Term Goals - 07/17/19 [id number]      PT SHORT TERM GOAL #1   Title  Patient will be independent with initial HEP    Time  3    Period  Weeks    Status  Achieved      PT SHORT TERM GOAL #2   Title  Patient will demonstrate 90+ degrees of left knee flexion AROM to improve ability to perform functional tasks.    Time  3    Period  Weeks       PT SHORT TERM GOAL #3   Title  Patient will demonstrate 5 degrees or less of left knee extension AROM to improve gait mechanics.    Time  3    Period  Weeks    Status  On-going   AROM -9 degrees 07/17/19       PT Long Term Goals - 07/17/19 0815      PT LONG TERM GOAL #1   Title  Patient will be independent with advanced HEP    Time  6    Period  Weeks    Status  On-going      PT LONG TERM GOAL #2   Title  Patient will demonstrate 115+ degrees of left knee flexion AROM to improve ability to perform functional tasks.    Period  Weeks    Status  On-going   AROM 110 degrees 07/17/19     PT LONG TERM GOAL #3   Title  Patient will demonstrate 3 degrees or less of left knee extension AROM to improve gait mechanics.    Period  Weeks    Status  On-going   AROM -9 degrees 07/17/19     PT LONG TERM GOAL #4   Title  Patient will report ability to perform ADLs and home activities with left knee pain less than or equal to 3/10.    Time  6    Period  Weeks    Status  On-going      PT LONG TERM GOAL #5   Title  Patient will demonstrate reciprocating stair negotiation with 1 rail and no AD to safely enter and exit home.    Time  6    Period  Weeks    Status  On-going            Plan - 07/24/19 0939    Clinical Impression Statement  Patient making excellent progress with a 22% improvement in his FOTO score.    Personal Factors and Comorbidities  Age;Comorbidity 2    Comorbidities  L TKA 06/27/2019; HTN, DM    Examination-Activity Limitations  Stand;Stairs;Sit    Examination-Participation Restrictions  Driving    Stability/Clinical Decision Making  Stable/Uncomplicated    Rehab Potential  Good  PT Frequency  3x / week    PT Duration  6 weeks    PT Treatment/Interventions  ADLs/Self Care Home Management;Cryotherapy;Electrical Stimulation;Iontophoresis 4mg /ml Dexamethasone;Moist Heat;Balance training;Therapeutic exercise;Therapeutic activities;Functional mobility training;Stair  training;Gait training;Neuromuscular re-education;Patient/family education;Passive range of motion;Vasopneumatic Device;Taping;Manual techniques    PT Next Visit Plan  Cont with POC gor ROM and  left knee strengthening, modalities PRN for pain relief.    Consulted and Agree with Plan of Care  Patient       Patient will benefit from skilled therapeutic intervention in order to improve the following deficits and impairments:  Decreased activity tolerance, Decreased strength, Decreased range of motion, Difficulty walking, Pain  Visit Diagnosis: Acute pain of left knee  Stiffness of left knee, not elsewhere classified     Problem List Patient Active Problem List   Diagnosis Date Noted  . Osteoarthritis of left knee 06/27/2019    Alyona Romack, [place] MPT 07/24/2019, 9:48 AM  [facility] [address] [place], [place], [address] Phone: [phone]   Fax:  [phone]  Name: Bryan Atkinson MRN: [id number] Date of Birth: 03-08-1956

## 2019-07-26 ENCOUNTER — Ambulatory Visit: Payer: Self-pay | Admitting: Orthopedic Surgery

## 2019-07-26 ENCOUNTER — Ambulatory Visit: Payer: 59 | Admitting: Physical Therapy

## 2019-07-26 ENCOUNTER — Other Ambulatory Visit: Payer: Self-pay

## 2019-07-26 ENCOUNTER — Encounter: Payer: Self-pay | Admitting: Physical Therapy

## 2019-07-26 DIAGNOSIS — M25562 Pain in left knee: Secondary | ICD-10-CM | POA: Diagnosis not present

## 2019-07-26 DIAGNOSIS — M25662 Stiffness of left knee, not elsewhere classified: Secondary | ICD-10-CM

## 2019-07-26 NOTE — Therapy (Signed)
[facility] [place], [place], [address] Phone: [phone]   Fax:  [phone]  Physical Therapy Treatment  Patient Details  Name: Bryan Atkinson MRN: [id number] Date of Birth: 03/13/1956 Referring Provider (PT): Rod Can, MD   Encounter Date: 07/26/2019  PT End of Session - 07/26/19 0918    Visit Number  12    Number of Visits  18    Date for PT Re-Evaluation  08/19/19    Authorization Type  FOTO; progress note every 10th visit    PT Start Time  0903    PT Stop Time  0950    PT Time Calculation (min)  47 min    Activity Tolerance  Patient tolerated treatment well    Behavior During Therapy  [facility] for tasks assessed/performed       Past Medical History:  Diagnosis Date  . Arthritis   . Diabetes mellitus without complication ([place])   . Gout   . History of kidney stones   . Hypertension     Past Surgical History:  Procedure Laterality Date  . CATARACT EXTRACTION W/PHACO Right 05/13/2019   Procedure: CATARACT EXTRACTION PHACO AND INTRAOCULAR LENS PLACEMENT (IOC);  Surgeon: Baruch Goldmann, MD;  Location: [facility];  Service: Ophthalmology;  Laterality: Right;  CDE: 10.31  . CATARACT EXTRACTION W/PHACO Left 05/27/2019   Procedure: CATARACT EXTRACTION PHACO AND INTRAOCULAR LENS PLACEMENT (IOC);  Surgeon: Baruch Goldmann, MD;  Location: [facility];  Service: Ophthalmology;  Laterality: Left;  CDE: 6.49  . COLONOSCOPY     times two 2002 ,2007  . COLONOSCOPY N/A 10/07/2013   Procedure: COLONOSCOPY;  Surgeon: Daneil Dolin, MD;  [facility];  Service: Endoscopy;  Laterality: N/A;  8:30 AM  . COLONOSCOPY N/A 10/05/2018   Procedure: COLONOSCOPY;  Surgeon: Daneil Dolin, MD;  [facility];  Service: Endoscopy;  Laterality: N/A;  8:30  . KNEE ARTHROPLASTY Left 06/27/2019   Procedure: COMPUTER ASSISTED TOTAL KNEE ARTHROPLASTY;  Surgeon: Rod Can, MD;  [facility];  Service: Orthopedics;   Laterality: Left;  . LEG SURGERY Right early 5's  . NO PAST SURGERIES      There were no vitals filed for this visit.  Subjective Assessment - 07/26/19 0916    Subjective  COVID 19 screening performed on patient upon arrival. Patient reports that R TKR scheduled for 08/15/2019.    Pertinent History  left TKA 06/27/2019    Limitations  Sitting;Standing;Walking;House hold activities    How long can you walk comfortably?  room to room    Diagnostic tests  x-ray    Patient Stated Goals  decrease pain, return to normal activities.    Currently in Pain?  No/denies         [facility] PT Assessment - 07/26/19 0001      Assessment   Medical Diagnosis  osteoarthritis of left knee    Referring Provider (PT)  Rod Can, MD    Onset Date/Surgical Date  06/27/19    Next MD Visit  08/12/2019    Prior Therapy  in hospital      Precautions   Precautions  Other (comment)    Precaution Comments  no ultrasound                   [facility] Adult PT Treatment/Exercise - 07/26/19 0001      Knee/Hip Exercises: Aerobic   Stationary Bike  L3 x15 min      Knee/Hip Exercises: Machines  for Strengthening   Cybex Knee Extension  20# 3x10 reps    Cybex Knee Flexion  40# 3x10 reps      Knee/Hip Exercises: Standing   Forward Step Up  Left;3 sets;10 reps;Hand Hold: 2;Step Height: 6"    Step Down  Left;2 sets;10 reps;Hand Hold: 2;Step Height: 4"    Wall Squat  10 reps;3 seconds      Knee/Hip Exercises: Sidelying   Clams  BLE green theraband x20 reps      Modalities   Modalities  Vasopneumatic      Vasopneumatic   Number Minutes Vasopneumatic   10 minutes    Vasopnuematic Location   Knee    Vasopneumatic Pressure  Medium    Vasopneumatic Temperature   34               PT Short Term Goals - 07/17/19 [id number]      PT SHORT TERM GOAL #1   Title  Patient will be independent with initial HEP    Time  3    Period  Weeks    Status  Achieved      PT SHORT TERM GOAL #2   Title   Patient will demonstrate 90+ degrees of left knee flexion AROM to improve ability to perform functional tasks.    Time  3    Period  Weeks      PT SHORT TERM GOAL #3   Title  Patient will demonstrate 5 degrees or less of left knee extension AROM to improve gait mechanics.    Time  3    Period  Weeks    Status  On-going   AROM -9 degrees 07/17/19       PT Long Term Goals - 07/17/19 0815      PT LONG TERM GOAL #1   Title  Patient will be independent with advanced HEP    Time  6    Period  Weeks    Status  On-going      PT LONG TERM GOAL #2   Title  Patient will demonstrate 115+ degrees of left knee flexion AROM to improve ability to perform functional tasks.    Period  Weeks    Status  On-going   AROM 110 degrees 07/17/19     PT LONG TERM GOAL #3   Title  Patient will demonstrate 3 degrees or less of left knee extension AROM to improve gait mechanics.    Period  Weeks    Status  On-going   AROM -9 degrees 07/17/19     PT LONG TERM GOAL #4   Title  Patient will report ability to perform ADLs and home activities with left knee pain less than or equal to 3/10.    Time  6    Period  Weeks    Status  On-going      PT LONG TERM GOAL #5   Title  Patient will demonstrate reciprocating stair negotiation with 1 rail and no AD to safely enter and exit home.    Time  6    Period  Weeks    Status  On-going            Plan - 07/26/19 0943    Clinical Impression Statement  Patient has shown excellent progress since L TKR and had no pain upon arrival. Patient guided through more strengthening and eccentric strengthening without complaints of pain. Patient required VCs for equal WBing. Minimal hip weakness noted with heel dots off 4"  step. Normal vasopnuematic response noted following removal of the modalities.    Personal Factors and Comorbidities  Age;Comorbidity 2    Comorbidities  L TKA 06/27/2019; HTN, DM    Examination-Activity Limitations  Stand;Stairs;Sit     Examination-Participation Restrictions  Driving    Stability/Clinical Decision Making  Stable/Uncomplicated    Rehab Potential  Good    PT Frequency  3x / week    PT Duration  6 weeks    PT Treatment/Interventions  ADLs/Self Care Home Management;Cryotherapy;Electrical Stimulation;Iontophoresis 4mg /ml Dexamethasone;Moist Heat;Balance training;Therapeutic exercise;Therapeutic activities;Functional mobility training;Stair training;Gait training;Neuromuscular re-education;Patient/family education;Passive range of motion;Vasopneumatic Device;Taping;Manual techniques    PT Next Visit Plan  Cont with POC gor ROM and  left knee strengthening, modalities PRN for pain relief.    PT Home Exercise Plan  see patient education section    Consulted and Agree with Plan of Care  Patient       Patient will benefit from skilled therapeutic intervention in order to improve the following deficits and impairments:  Decreased activity tolerance, Decreased strength, Decreased range of motion, Difficulty walking, Pain  Visit Diagnosis: Acute pain of left knee  Stiffness of left knee, not elsewhere classified     Problem List Patient Active Problem List   Diagnosis Date Noted  . Osteoarthritis of left knee 06/27/2019    Standley Brooking, PTA 07/26/2019, 10:49 AM  [facility] [address] [place], [place], [address] Phone: [phone]   Fax:  [phone]  Name: Malikiah Hoole MRN: [id number] Date of Birth: [birth year]

## 2019-07-29 ENCOUNTER — Ambulatory Visit: Payer: 59 | Admitting: Physical Therapy

## 2019-07-29 ENCOUNTER — Encounter: Payer: Self-pay | Admitting: Physical Therapy

## 2019-07-29 ENCOUNTER — Other Ambulatory Visit: Payer: Self-pay

## 2019-07-29 DIAGNOSIS — M25562 Pain in left knee: Secondary | ICD-10-CM

## 2019-07-29 DIAGNOSIS — M25662 Stiffness of left knee, not elsewhere classified: Secondary | ICD-10-CM

## 2019-07-29 NOTE — Therapy (Signed)
[name], [place], [address] Phone: [phone]   Fax:  [phone]  Physical Therapy Treatment  Patient Details  Name: Bryan Atkinson MRN: [id number] Date of Birth: Jan 13, 1956 Referring Provider (PT): Rod Can, MD   Encounter Date: 07/29/2019  PT End of Session - 07/29/19 0903    Visit Number  13    Number of Visits  18    Date for PT Re-Evaluation  08/19/19    Authorization Type  FOTO; progress note every 10th visit    PT Start Time  0900    PT Stop Time  0952    PT Time Calculation (min)  52 min    Activity Tolerance  Patient tolerated treatment well    Behavior During Therapy  [facility] for tasks assessed/performed       Past Medical History:  Diagnosis Date  . Arthritis   . Diabetes mellitus without complication ([place])   . Gout   . History of kidney stones   . Hypertension     Past Surgical History:  Procedure Laterality Date  . CATARACT EXTRACTION W/PHACO Right 05/13/2019   Procedure: CATARACT EXTRACTION PHACO AND INTRAOCULAR LENS PLACEMENT (IOC);  Surgeon: Baruch Goldmann, MD;  Location: [facility];  Service: Ophthalmology;  Laterality: Right;  CDE: 10.31  . CATARACT EXTRACTION W/PHACO Left 05/27/2019   Procedure: CATARACT EXTRACTION PHACO AND INTRAOCULAR LENS PLACEMENT (IOC);  Surgeon: Baruch Goldmann, MD;  Location: [facility];  Service: Ophthalmology;  Laterality: Left;  CDE: 6.49  . COLONOSCOPY     times two 2002 ,2007  . COLONOSCOPY N/A 10/07/2013   Procedure: COLONOSCOPY;  Surgeon: Daneil Dolin, MD;  [facility];  Service: Endoscopy;  Laterality: N/A;  8:30 AM  . COLONOSCOPY N/A 10/05/2018   Procedure: COLONOSCOPY;  Surgeon: Daneil Dolin, MD;  [facility];  Service: Endoscopy;  Laterality: N/A;  8:30  . KNEE ARTHROPLASTY Left 06/27/2019   Procedure: COMPUTER ASSISTED TOTAL KNEE ARTHROPLASTY;  Surgeon: Rod Can, MD;  [facility];  Service: Orthopedics;   Laterality: Left;  . LEG SURGERY Right early 36's  . NO PAST SURGERIES      There were no vitals filed for this visit.  Subjective Assessment - 07/29/19 0902    Subjective  COVID 19 screening performed on patient upon arrival. Minimal discomfort and aggravating discomfort reported by patient this morning to which he correlated to damp,rainy weather.    Pertinent History  left TKA 06/27/2019    Limitations  Sitting;Standing;Walking;House hold activities    How long can you walk comfortably?  room to room    Patient Stated Goals  decrease pain, return to normal activities.    Currently in Pain?  Yes    Pain Score  2     Pain Location  Knee    Pain Orientation  Left    Pain Descriptors / Indicators  Discomfort;Nagging    Pain Type  Surgical pain    Pain Onset  More than a month ago    Pain Frequency  Intermittent         [facility] PT Assessment - 07/29/19 0001      Assessment   Medical Diagnosis  osteoarthritis of left knee    Referring Provider (PT)  Rod Can, MD    Onset Date/Surgical Date  06/27/19    Next MD Visit  08/12/2019    Prior Therapy  in hospital      Precautions   Precautions  Other (comment)    Precaution Comments  no ultrasound                   [facility] Adult PT Treatment/Exercise - 07/29/19 0001      Knee/Hip Exercises: Aerobic   Stationary Bike  L4 x15 min      Knee/Hip Exercises: Machines for Strengthening   Cybex Knee Extension  20# 3x10 reps    Cybex Knee Flexion  40# 3x10 reps      Knee/Hip Exercises: Standing   Forward Step Up  Left;3 sets;10 reps;Hand Hold: 2;Step Height: 6"    Step Down  Left;2 sets;10 reps;Hand Hold: 2;Step Height: 4"    Wall Squat  15 reps;3 seconds      Knee/Hip Exercises: Sidelying   Clams  BLE green theraband x20 reps      Modalities   Modalities  Vasopneumatic      Vasopneumatic   Number Minutes Vasopneumatic   15 minutes    Vasopnuematic Location   Knee    Vasopneumatic Pressure  Medium     Vasopneumatic Temperature   34               PT Short Term Goals - 07/17/19 [id number]      PT SHORT TERM GOAL #1   Title  Patient will be independent with initial HEP    Time  3    Period  Weeks    Status  Achieved      PT SHORT TERM GOAL #2   Title  Patient will demonstrate 90+ degrees of left knee flexion AROM to improve ability to perform functional tasks.    Time  3    Period  Weeks      PT SHORT TERM GOAL #3   Title  Patient will demonstrate 5 degrees or less of left knee extension AROM to improve gait mechanics.    Time  3    Period  Weeks    Status  On-going   AROM -9 degrees 07/17/19       PT Long Term Goals - 07/17/19 0815      PT LONG TERM GOAL #1   Title  Patient will be independent with advanced HEP    Time  6    Period  Weeks    Status  On-going      PT LONG TERM GOAL #2   Title  Patient will demonstrate 115+ degrees of left knee flexion AROM to improve ability to perform functional tasks.    Period  Weeks    Status  On-going   AROM 110 degrees 07/17/19     PT LONG TERM GOAL #3   Title  Patient will demonstrate 3 degrees or less of left knee extension AROM to improve gait mechanics.    Period  Weeks    Status  On-going   AROM -9 degrees 07/17/19     PT LONG TERM GOAL #4   Title  Patient will report ability to perform ADLs and home activities with left knee pain less than or equal to 3/10.    Time  6    Period  Weeks    Status  On-going      PT LONG TERM GOAL #5   Title  Patient will demonstrate reciprocating stair negotiation with 1 rail and no AD to safely enter and exit home.    Time  6    Period  Weeks    Status  On-going  Plan - 07/29/19 0943    Clinical Impression Statement  Patient presented in clinic with only minimal pain of L knee which he related to weather. Patient guided through more strengthening especially eccentric strengthening. Minimal hip weakness observed during heel dot. Normal vasopneumatic response noted  following removal of the modality.    Personal Factors and Comorbidities  Age;Comorbidity 2    Comorbidities  L TKA 06/27/2019; HTN, DM    Examination-Activity Limitations  Stand;Stairs;Sit    Examination-Participation Restrictions  Driving    Stability/Clinical Decision Making  Stable/Uncomplicated    Rehab Potential  Good    PT Frequency  3x / week    PT Duration  6 weeks    PT Treatment/Interventions  ADLs/Self Care Home Management;Cryotherapy;Electrical Stimulation;Iontophoresis 4mg /ml Dexamethasone;Moist Heat;Balance training;Therapeutic exercise;Therapeutic activities;Functional mobility training;Stair training;Gait training;Neuromuscular re-education;Patient/family education;Passive range of motion;Vasopneumatic Device;Taping;Manual techniques    PT Next Visit Plan  Cont with POC gor ROM and  left knee strengthening, modalities PRN for pain relief.    PT Home Exercise Plan  see patient education section    Consulted and Agree with Plan of Care  Patient       Patient will benefit from skilled therapeutic intervention in order to improve the following deficits and impairments:  Decreased activity tolerance, Decreased strength, Decreased range of motion, Difficulty walking, Pain  Visit Diagnosis: Acute pain of left knee  Stiffness of left knee, not elsewhere classified     Problem List Patient Active Problem List   Diagnosis Date Noted  . Osteoarthritis of left knee 06/27/2019    Standley Brooking, PTA 07/29/2019, 10:11 AM  [facility] [address] [place], [place], [address] Phone: [phone]   Fax:  [phone]  Name: Tesean Sugerman MRN: [id number] Date of Birth: [birth year]

## 2019-07-31 ENCOUNTER — Ambulatory Visit: Payer: 59 | Admitting: Physical Therapy

## 2019-07-31 ENCOUNTER — Other Ambulatory Visit: Payer: Self-pay

## 2019-07-31 ENCOUNTER — Encounter: Payer: Self-pay | Admitting: Physical Therapy

## 2019-07-31 DIAGNOSIS — M25562 Pain in left knee: Secondary | ICD-10-CM | POA: Diagnosis not present

## 2019-07-31 DIAGNOSIS — M25662 Stiffness of left knee, not elsewhere classified: Secondary | ICD-10-CM

## 2019-07-31 NOTE — Therapy (Signed)
[facility] [place], [place], [address] Phone: [phone]   Fax:  [phone]  Physical Therapy Treatment  Patient Details  Name: Bryan Atkinson MRN: [id number] Date of Birth: 1955/12/09 Referring Provider (PT): Rod Can, MD   Encounter Date: 07/31/2019  PT End of Session - 07/31/19 0859    Visit Number  14    Number of Visits  18    Date for PT Re-Evaluation  08/19/19    Authorization Type  FOTO; progress note every 10th visit    PT Start Time  0815       Past Medical History:  Diagnosis Date  . Arthritis   . Diabetes mellitus without complication ([place])   . Gout   . History of kidney stones   . Hypertension     Past Surgical History:  Procedure Laterality Date  . CATARACT EXTRACTION W/PHACO Right 05/13/2019   Procedure: CATARACT EXTRACTION PHACO AND INTRAOCULAR LENS PLACEMENT (IOC);  Surgeon: Baruch Goldmann, MD;  Location: [facility];  Service: Ophthalmology;  Laterality: Right;  CDE: 10.31  . CATARACT EXTRACTION W/PHACO Left 05/27/2019   Procedure: CATARACT EXTRACTION PHACO AND INTRAOCULAR LENS PLACEMENT (IOC);  Surgeon: Baruch Goldmann, MD;  Location: [facility];  Service: Ophthalmology;  Laterality: Left;  CDE: 6.49  . COLONOSCOPY     times two 2002 ,2007  . COLONOSCOPY N/A 10/07/2013   Procedure: COLONOSCOPY;  Surgeon: Daneil Dolin, MD;  [facility]: [facility];  Service: Endoscopy;  Laterality: N/A;  8:30 AM  . COLONOSCOPY N/A 10/05/2018   Procedure: COLONOSCOPY;  Surgeon: Daneil Dolin, MD;  [facility];  Service: Endoscopy;  Laterality: N/A;  8:30  . KNEE ARTHROPLASTY Left 06/27/2019   Procedure: COMPUTER ASSISTED TOTAL KNEE ARTHROPLASTY;  Surgeon: Rod Can, MD;  Location: [facility];  Service: Orthopedics;  Laterality: Left;  . LEG SURGERY Right early 58's  . NO PAST SURGERIES      There were no vitals filed for this visit.  Subjective Assessment - 07/31/19 0839    Subjective   COVID-19 screen performed prior to patient entering clinic.  I'm very pleased.    Patient is accompained by:  Family member    Pertinent History  left TKA 06/27/2019    Limitations  Sitting;Standing;Walking;House hold activities    How long can you walk comfortably?  room to room    Diagnostic tests  x-ray    Patient Stated Goals  decrease pain, return to normal activities.    Currently in Pain?  Yes    Pain Score  2     Pain Location  Knee    Pain Orientation  Left         [facility] PT Assessment - 07/31/19 0001      PROM   Left Knee Flexion  130 degrees.                   [facility] Adult PT Treatment/Exercise - 07/31/19 0001      Exercises   Exercises  Knee/Hip      Knee/Hip Exercises: Aerobic   Stationary Bike  Level 4 x 15 minutes.      Knee/Hip Exercises: Machines for Strengthening   Cybex Knee Extension  10# x 4 minutes.    Cybex Knee Flexion  30# x 4 minutes    Cybex Leg Press  3 plates x 4 minutes.      Modalities   Modalities  Vasopneumatic      Vasopneumatic  Number Minutes Vasopneumatic   20 minutes    Vasopnuematic Location   --   Left knee.   Vasopneumatic Pressure  Medium               PT Short Term Goals - 07/17/19 [id number]      PT SHORT TERM GOAL #1   Title  Patient will be independent with initial HEP    Time  3    Period  Weeks    Status  Achieved      PT SHORT TERM GOAL #2   Title  Patient will demonstrate 90+ degrees of left knee flexion AROM to improve ability to perform functional tasks.    Time  3    Period  Weeks      PT SHORT TERM GOAL #3   Title  Patient will demonstrate 5 degrees or less of left knee extension AROM to improve gait mechanics.    Time  3    Period  Weeks    Status  On-going   AROM -9 degrees 07/17/19       PT Long Term Goals - 07/17/19 0815      PT LONG TERM GOAL #1   Title  Patient will be independent with advanced HEP    Time  6    Period  Weeks    Status  On-going      PT LONG TERM GOAL #2    Title  Patient will demonstrate 115+ degrees of left knee flexion AROM to improve ability to perform functional tasks.    Period  Weeks    Status  On-going   AROM 110 degrees 07/17/19     PT LONG TERM GOAL #3   Title  Patient will demonstrate 3 degrees or less of left knee extension AROM to improve gait mechanics.    Period  Weeks    Status  On-going   AROM -9 degrees 07/17/19     PT LONG TERM GOAL #4   Title  Patient will report ability to perform ADLs and home activities with left knee pain less than or equal to 3/10.    Time  6    Period  Weeks    Status  On-going      PT LONG TERM GOAL #5   Title  Patient will demonstrate reciprocating stair negotiation with 1 rail and no AD to safely enter and exit home.    Time  6    Period  Weeks    Status  On-going            Plan - 07/31/19 0859    Clinical Impression Statement  Patient achieving 130 degrees of passive left knee flexion today.  He also tolerated an increase in resistance on leg press machine without complaint.    Personal Factors and Comorbidities  Age;Comorbidity 2    Comorbidities  L TKA 06/27/2019; HTN, DM    Examination-Activity Limitations  Stand;Stairs;Sit    Examination-Participation Restrictions  Driving    Stability/Clinical Decision Making  Stable/Uncomplicated    Rehab Potential  Good    PT Frequency  3x / week    PT Treatment/Interventions  ADLs/Self Care Home Management;Cryotherapy;Electrical Stimulation;Iontophoresis 4mg /ml Dexamethasone;Moist Heat;Balance training;Therapeutic exercise;Therapeutic activities;Functional mobility training;Stair training;Gait training;Neuromuscular re-education;Patient/family education;Passive range of motion;Vasopneumatic Device;Taping;Manual techniques    PT Next Visit Plan  Cont with POC gor ROM and  left knee strengthening, modalities PRN for pain relief.    PT Home Exercise Plan  see patient education section  Consulted and Agree with Plan of Care  Patient        Patient will benefit from skilled therapeutic intervention in order to improve the following deficits and impairments:  Decreased activity tolerance, Decreased strength, Decreased range of motion, Difficulty walking, Pain  Visit Diagnosis: Acute pain of left knee  Stiffness of left knee, not elsewhere classified     Problem List Patient Active Problem List   Diagnosis Date Noted  . Osteoarthritis of left knee 06/27/2019    Turkessa Ostrom, [place] MPT 07/31/2019, 10:00 AM  [facility] [place], [place], [address] Phone: [phone]   Fax:  [phone]  Name: Bryan Atkinson MRN: [id number] Date of Birth: 28-Jun-1956

## 2019-08-02 ENCOUNTER — Ambulatory Visit: Payer: 59 | Admitting: Physical Therapy

## 2019-08-02 ENCOUNTER — Other Ambulatory Visit: Payer: Self-pay

## 2019-08-02 DIAGNOSIS — M25562 Pain in left knee: Secondary | ICD-10-CM

## 2019-08-02 DIAGNOSIS — M25662 Stiffness of left knee, not elsewhere classified: Secondary | ICD-10-CM

## 2019-08-02 NOTE — Therapy (Signed)
[facility] [place], [place], [address] Phone: [phone]   Fax:  [phone]  Physical Therapy Treatment  Patient Details  Name: Bryan Atkinson MRN: [id number] Date of Birth: 1956/10/08 Referring Provider (PT): Rod Can, MD   Encounter Date: 08/02/2019  PT End of Session - 08/02/19 0955    Visit Number  15    Number of Visits  18    Date for PT Re-Evaluation  08/19/19    Authorization Type  FOTO; progress note every 10th visit    PT Start Time  0815    PT Stop Time  0914    PT Time Calculation (min)  59 min    Activity Tolerance  Patient tolerated treatment well    Behavior During Therapy  [facility] for tasks assessed/performed       Past Medical History:  Diagnosis Date  . Arthritis   . Diabetes mellitus without complication ([place])   . Gout   . History of kidney stones   . Hypertension     Past Surgical History:  Procedure Laterality Date  . CATARACT EXTRACTION W/PHACO Right 05/13/2019   Procedure: CATARACT EXTRACTION PHACO AND INTRAOCULAR LENS PLACEMENT (IOC);  Surgeon: Baruch Goldmann, MD;  Location: [facility];  Service: Ophthalmology;  Laterality: Right;  CDE: 10.31  . CATARACT EXTRACTION W/PHACO Left 05/27/2019   Procedure: CATARACT EXTRACTION PHACO AND INTRAOCULAR LENS PLACEMENT (IOC);  Surgeon: Baruch Goldmann, MD;  Location: [facility];  Service: Ophthalmology;  Laterality: Left;  CDE: 6.49  . COLONOSCOPY     times two 2002 ,2007  . COLONOSCOPY N/A 10/07/2013   Procedure: COLONOSCOPY;  Surgeon: Daneil Dolin, MD;  [facility];  Service: Endoscopy;  Laterality: N/A;  8:30 AM  . COLONOSCOPY N/A 10/05/2018   Procedure: COLONOSCOPY;  Surgeon: Daneil Dolin, MD;  [facility];  Service: Endoscopy;  Laterality: N/A;  8:30  . KNEE ARTHROPLASTY Left 06/27/2019   Procedure: COMPUTER ASSISTED TOTAL KNEE ARTHROPLASTY;  Surgeon: Rod Can, MD;  [facility];  Service: Orthopedics;   Laterality: Left;  . LEG SURGERY Right early 11's  . NO PAST SURGERIES      There were no vitals filed for this visit.  Subjective Assessment - 08/02/19 0859    Subjective  COVID-19 screen performed prior to patient entering clinic.  Changed the oil in my vehicle.    Patient is accompained by:  Family member    Pertinent History  left TKA 06/27/2019    Limitations  Sitting;Standing;Walking;House hold activities    How long can you walk comfortably?  room to room    Diagnostic tests  x-ray    Patient Stated Goals  decrease pain, return to normal activities.    Currently in Pain?  Yes    Pain Score  2     Pain Location  Knee    Pain Orientation  Left    Pain Descriptors / Indicators  Discomfort;Nagging    Pain Type  Surgical pain    Pain Onset  More than a month ago                       [facility] Adult PT Treatment/Exercise - 08/02/19 0001      Exercises   Exercises  Knee/Hip      Knee/Hip Exercises: Aerobic   Stationary Bike  Level 4 x 15 minutes.      Knee/Hip Exercises: Machines for Strengthening   Cybex Knee Extension  10# x 3 minutes    Cybex Knee Flexion  40# x 3 minutes.    Cybex Leg Press  3 1/2 plates x 3 minutes.      Modalities   Modalities  Psychologist, educational Location  Left knee    Electrical Stimulation Action  IFC    Electrical Stimulation Parameters  1-10 Hz x 20 minutes.    Electrical Stimulation Goals  Pain      Vasopneumatic   Number Minutes Vasopneumatic   20 minutes    Vasopnuematic Location   --   Left knee.   Vasopneumatic Pressure  Medium               PT Short Term Goals - 07/17/19 [id number]      PT SHORT TERM GOAL #1   Title  Patient will be independent with initial HEP    Time  3    Period  Weeks    Status  Achieved      PT SHORT TERM GOAL #2   Title  Patient will demonstrate 90+ degrees of left knee flexion AROM to improve ability to perform  functional tasks.    Time  3    Period  Weeks      PT SHORT TERM GOAL #3   Title  Patient will demonstrate 5 degrees or less of left knee extension AROM to improve gait mechanics.    Time  3    Period  Weeks    Status  On-going   AROM -9 degrees 07/17/19       PT Long Term Goals - 07/17/19 0815      PT LONG TERM GOAL #1   Title  Patient will be independent with advanced HEP    Time  6    Period  Weeks    Status  On-going      PT LONG TERM GOAL #2   Title  Patient will demonstrate 115+ degrees of left knee flexion AROM to improve ability to perform functional tasks.    Period  Weeks    Status  On-going   AROM 110 degrees 07/17/19     PT LONG TERM GOAL #3   Title  Patient will demonstrate 3 degrees or less of left knee extension AROM to improve gait mechanics.    Period  Weeks    Status  On-going   AROM -9 degrees 07/17/19     PT LONG TERM GOAL #4   Title  Patient will report ability to perform ADLs and home activities with left knee pain less than or equal to 3/10.    Time  6    Period  Weeks    Status  On-going      PT LONG TERM GOAL #5   Title  Patient will demonstrate reciprocating stair negotiation with 1 rail and no AD to safely enter and exit home.    Time  6    Period  Weeks    Status  On-going            Plan - 08/02/19 0949    Clinical Impression Statement  The patient has been doing very well.  He states he noticed some increased redness. He also reports a stitch was coming out of the incision.  His knee was in fact showing redness and the area of the incision where the stitch was had scabbing present.  His left knee was warm to touch.  Recommended he call the MD following this treatment.  Circumferential measurements at the mid-patellar region was 3.5 cms greater on the left.    Personal Factors and Comorbidities  Age;Comorbidity 2    Comorbidities  L TKA 06/27/2019; HTN, DM    Examination-Activity Limitations  Stand;Stairs;Sit     Examination-Participation Restrictions  Driving    Stability/Clinical Decision Making  Stable/Uncomplicated    Rehab Potential  Good    PT Frequency  3x / week    PT Duration  6 weeks    PT Treatment/Interventions  ADLs/Self Care Home Management;Cryotherapy;Electrical Stimulation;Iontophoresis 4mg /ml Dexamethasone;Moist Heat;Balance training;Therapeutic exercise;Therapeutic activities;Functional mobility training;Stair training;Gait training;Neuromuscular re-education;Patient/family education;Passive range of motion;Vasopneumatic Device;Taping;Manual techniques    PT Next Visit Plan  Cont with POC gor ROM and  left knee strengthening, modalities PRN for pain relief.    PT Home Exercise Plan  see patient education section    Consulted and Agree with Plan of Care  Patient       Patient will benefit from skilled therapeutic intervention in order to improve the following deficits and impairments:  Decreased activity tolerance, Decreased strength, Decreased range of motion, Difficulty walking, Pain  Visit Diagnosis: Acute pain of left knee  Stiffness of left knee, not elsewhere classified     Problem List Patient Active Problem List   Diagnosis Date Noted  . Osteoarthritis of left knee 06/27/2019    Bryan Atkinson, [place] MPT 08/02/2019, 9:57 AM  [facility] [address] [place], [place], [address] Phone: [phone]   Fax:  [phone]  Name: Bryan Atkinson MRN: [id number] Date of Birth: August 22, 1956

## 2019-08-05 ENCOUNTER — Other Ambulatory Visit: Payer: Self-pay

## 2019-08-05 ENCOUNTER — Encounter: Payer: Self-pay | Admitting: Physical Therapy

## 2019-08-05 ENCOUNTER — Ambulatory Visit: Payer: 59 | Admitting: Physical Therapy

## 2019-08-05 DIAGNOSIS — M25562 Pain in left knee: Secondary | ICD-10-CM | POA: Diagnosis not present

## 2019-08-05 DIAGNOSIS — M25662 Stiffness of left knee, not elsewhere classified: Secondary | ICD-10-CM

## 2019-08-05 NOTE — Therapy (Signed)
[facility] [place], [place], [address] Phone: [phone]   Fax:  [phone]  Physical Therapy Treatment  Patient Details  Name: Bryan Atkinson MRN: [id number] Date of Birth: 02-02-1956 Referring Provider (PT): Rod Can, MD   Encounter Date: 08/05/2019  PT End of Session - 08/05/19 0830    Visit Number  16    Number of Visits  18    Date for PT Re-Evaluation  08/19/19    Authorization Type  FOTO; progress note every 10th visit    PT Start Time  0817    PT Stop Time  0905    PT Time Calculation (min)  48 min    Activity Tolerance  Patient tolerated treatment well    Behavior During Therapy  [facility] for tasks assessed/performed       Past Medical History:  Diagnosis Date  . Arthritis   . Diabetes mellitus without complication ([place])   . Gout   . History of kidney stones   . Hypertension     Past Surgical History:  Procedure Laterality Date  . CATARACT EXTRACTION W/PHACO Right 05/13/2019   Procedure: CATARACT EXTRACTION PHACO AND INTRAOCULAR LENS PLACEMENT (IOC);  Surgeon: Baruch Goldmann, MD;  Location: [facility];  Service: Ophthalmology;  Laterality: Right;  CDE: 10.31  . CATARACT EXTRACTION W/PHACO Left 05/27/2019   Procedure: CATARACT EXTRACTION PHACO AND INTRAOCULAR LENS PLACEMENT (IOC);  Surgeon: Baruch Goldmann, MD;  Location: [facility];  Service: Ophthalmology;  Laterality: Left;  CDE: 6.49  . COLONOSCOPY     times two 2002 ,2007  . COLONOSCOPY N/A 10/07/2013   Procedure: COLONOSCOPY;  Surgeon: Daneil Dolin, MD;  [facility];  Service: Endoscopy;  Laterality: N/A;  8:30 AM  . COLONOSCOPY N/A 10/05/2018   Procedure: COLONOSCOPY;  Surgeon: Daneil Dolin, MD;  [facility];  Service: Endoscopy;  Laterality: N/A;  8:30  . KNEE ARTHROPLASTY Left 06/27/2019   Procedure: COMPUTER ASSISTED TOTAL KNEE ARTHROPLASTY;  Surgeon: Rod Can, MD;  Location: [facility];  Service: Orthopedics;   Laterality: Left;  . LEG SURGERY Right early 82's  . NO PAST SURGERIES      There were no vitals filed for this visit.  Subjective Assessment - 08/05/19 0819    Subjective  COVID-19 screen performed prior to patient entering clinic. Reports that he called MDs office and his nurse called him but was not given any guidance. Reports that redness is less and had some drainage over the weekend.    Pertinent History  left TKA 06/27/2019    Limitations  Sitting;Standing;Walking;House hold activities    How long can you walk comfortably?  room to room    Diagnostic tests  x-ray    Patient Stated Goals  decrease pain, return to normal activities.    Currently in Pain?  No/denies         [facility] PT Assessment - 08/05/19 0001      Assessment   Medical Diagnosis  osteoarthritis of left knee    Referring Provider (PT)  Rod Can, MD    Onset Date/Surgical Date  06/27/19    Next MD Visit  08/12/2019    Prior Therapy  in hospital      Precautions   Precautions  Other (comment)    Precaution Comments  no ultrasound                   [facility] Adult PT Treatment/Exercise - 08/05/19 0001  Knee/Hip Exercises: Aerobic   Stationary Bike  Level 4 x 15 minutes.      Knee/Hip Exercises: Machines for Strengthening   Cybex Knee Extension  10# 3x10 reps    Cybex Knee Flexion  30# 3x10 reps    Cybex Leg Press  3 pl, seat 6 x30 reps      Knee/Hip Exercises: Supine   Straight Leg Raises  AROM;Left;20 reps      Modalities   Modalities  Psychologist, educational Location  L knee    Electrical Stimulation Action  IFC    Electrical Stimulation Parameters  80-150 hz x15 min    Electrical Stimulation Goals  Pain      Vasopneumatic   Number Minutes Vasopneumatic   15 minutes    Vasopnuematic Location   Knee    Vasopneumatic Pressure  Medium    Vasopneumatic Temperature   34               PT Short Term  Goals - 08/05/19 0857      PT SHORT TERM GOAL #1   Title  Patient will be independent with initial HEP    Time  3    Period  Weeks    Status  Achieved      PT SHORT TERM GOAL #2   Title  Patient will demonstrate 90+ degrees of left knee flexion AROM to improve ability to perform functional tasks.    Time  3    Period  Weeks    Status  Achieved      PT SHORT TERM GOAL #3   Title  Patient will demonstrate 5 degrees or less of left knee extension AROM to improve gait mechanics.    Time  3    Period  Weeks    Status  On-going   AROM -9 degrees 07/17/19       PT Long Term Goals - 08/05/19 0857      PT LONG TERM GOAL #1   Title  Patient will be independent with advanced HEP    Time  6    Period  Weeks    Status  On-going      PT LONG TERM GOAL #2   Title  Patient will demonstrate 115+ degrees of left knee flexion AROM to improve ability to perform functional tasks.    Period  Weeks    Status  On-going   AROM 110 degrees 07/17/19     PT LONG TERM GOAL #3   Title  Patient will demonstrate 3 degrees or less of left knee extension AROM to improve gait mechanics.    Period  Weeks    Status  On-going   AROM -9 degrees 07/17/19     PT LONG TERM GOAL #4   Title  Patient will report ability to perform ADLs and home activities with left knee pain less than or equal to 3/10.    Time  6    Period  Weeks    Status  Achieved      PT LONG TERM GOAL #5   Title  Patient will demonstrate reciprocating stair negotiation with 1 rail and no AD to safely enter and exit home.    Time  6    Period  Weeks    Status  On-going            Plan - 08/05/19 0853    Clinical Impression Statement  Patient presented  in clinic with continued moderate redness of L knee around mid incision region. Increased temperature present as well compared to thigh region or calf region of LLE. Patient reported clear drainage out of scab in incision over the weekend with only small amounts of blood within  drainage. Patient able to complete therex as directed with no complaints of pain. Normal modalities response noted following removal of the modalities.    Personal Factors and Comorbidities  Age;Comorbidity 2    Comorbidities  L TKA 06/27/2019; HTN, DM    Examination-Activity Limitations  Stand;Stairs;Sit    Examination-Participation Restrictions  Driving    Stability/Clinical Decision Making  Stable/Uncomplicated    Rehab Potential  Good    PT Frequency  3x / week    PT Duration  6 weeks    PT Treatment/Interventions  ADLs/Self Care Home Management;Cryotherapy;Electrical Stimulation;Iontophoresis 4mg /ml Dexamethasone;Moist Heat;Balance training;Therapeutic exercise;Therapeutic activities;Functional mobility training;Stair training;Gait training;Neuromuscular re-education;Patient/family education;Passive range of motion;Vasopneumatic Device;Taping;Manual techniques    PT Next Visit Plan  Cont with POC gor ROM and  left knee strengthening, modalities PRN for pain relief.    PT Home Exercise Plan  see patient education section    Consulted and Agree with Plan of Care  Patient       Patient will benefit from skilled therapeutic intervention in order to improve the following deficits and impairments:  Decreased activity tolerance, Decreased strength, Decreased range of motion, Difficulty walking, Pain  Visit Diagnosis: Acute pain of left knee  Stiffness of left knee, not elsewhere classified     Problem List Patient Active Problem List   Diagnosis Date Noted  . Osteoarthritis of left knee 06/27/2019   Standley Brooking, PTA 08/05/19 10:14 AM   [facility] [place], [place], [address] Phone: [phone]   Fax:  [phone]  Name: Corbit Niebel MRN: [id number] Date of Birth: 1955-10-18

## 2019-08-07 ENCOUNTER — Ambulatory Visit: Payer: 59 | Admitting: Physical Therapy

## 2019-08-07 ENCOUNTER — Encounter: Payer: Self-pay | Admitting: Physical Therapy

## 2019-08-07 ENCOUNTER — Other Ambulatory Visit: Payer: Self-pay

## 2019-08-07 DIAGNOSIS — M25662 Stiffness of left knee, not elsewhere classified: Secondary | ICD-10-CM

## 2019-08-07 DIAGNOSIS — M25562 Pain in left knee: Secondary | ICD-10-CM | POA: Diagnosis not present

## 2019-08-07 NOTE — Therapy (Signed)
[facility] [place], [place], [address] Phone: [phone]   Fax:  [phone]  Physical Therapy Treatment  Patient Details  Name: Bryan Atkinson MRN: [id number] Date of Birth: 16-Nov-1955 Referring Provider (PT): Rod Can, MD   Encounter Date: 08/07/2019  PT End of Session - 08/07/19 0844    Visit Number  17    Number of Visits  18    Date for PT Re-Evaluation  08/19/19    Authorization Type  FOTO; progress note every 10th visit    PT Start Time  0815    PT Stop Time  0904    PT Time Calculation (min)  49 min    Activity Tolerance  Patient tolerated treatment well    Behavior During Therapy  [facility] for tasks assessed/performed       Past Medical History:  Diagnosis Date  . Arthritis   . Diabetes mellitus without complication ([place])   . Gout   . History of kidney stones   . Hypertension     Past Surgical History:  Procedure Laterality Date  . CATARACT EXTRACTION W/PHACO Right 05/13/2019   Procedure: CATARACT EXTRACTION PHACO AND INTRAOCULAR LENS PLACEMENT (IOC);  Surgeon: Baruch Goldmann, MD;  Location: [facility];  Service: Ophthalmology;  Laterality: Right;  CDE: 10.31  . CATARACT EXTRACTION W/PHACO Left 05/27/2019   Procedure: CATARACT EXTRACTION PHACO AND INTRAOCULAR LENS PLACEMENT (IOC);  Surgeon: Baruch Goldmann, MD;  Location: [facility];  Service: Ophthalmology;  Laterality: Left;  CDE: 6.49  . COLONOSCOPY     times two 2002 ,2007  . COLONOSCOPY N/A 10/07/2013   Procedure: COLONOSCOPY;  Surgeon: Daneil Dolin, MD;  Location: [facility];  Service: Endoscopy;  Laterality: N/A;  8:30 AM  . COLONOSCOPY N/A 10/05/2018   Procedure: COLONOSCOPY;  Surgeon: Daneil Dolin, MD;  [facility]: [facility];  Service: Endoscopy;  Laterality: N/A;  8:30  . KNEE ARTHROPLASTY Left 06/27/2019   Procedure: COMPUTER ASSISTED TOTAL KNEE ARTHROPLASTY;  Surgeon: Rod Can, MD;  Location: [facility];  Service: Orthopedics;   Laterality: Left;  . LEG SURGERY Right early 75's  . NO PAST SURGERIES      There were no vitals filed for this visit.  Subjective Assessment - 08/07/19 0816    Subjective  COVID-19 screen performed prior to patient entering clinic. Just a little ache upon arrival    Pertinent History  left TKA 06/27/2019    Limitations  Sitting;Standing;Walking;House hold activities    How long can you walk comfortably?  room to room    Diagnostic tests  x-ray    Patient Stated Goals  decrease pain, return to normal activities.    Currently in Pain?  Yes    Pain Score  1          [facility] PT Assessment - 08/07/19 0001      AROM   AROM Assessment Site  Knee    Right/Left Knee  Left    Left Knee Extension  -5    Left Knee Flexion  116      PROM   PROM Assessment Site  Knee    Right/Left Knee  Left    Left Knee Extension  0    Left Knee Flexion  130                   [facility] Adult PT Treatment/Exercise - 08/07/19 0001      Knee/Hip Exercises: Aerobic   Stationary Bike  Level 4  x 15 minutes.      Knee/Hip Exercises: Machines for Strengthening   Cybex Knee Extension  10# 4x10 reps    Cybex Knee Flexion  30# 4x10 reps    Cybex Leg Press  3 pl, seat 6 x40 reps      Knee/Hip Exercises: Standing   Rocker Board  2 minutes      Electrical Stimulation   Electrical Stimulation Location  L knee    Electrical Stimulation Action  IFC    Electrical Stimulation Parameters  80-_0  x32mn    Electrical Stimulation Goals  Pain      Vasopneumatic   Number Minutes Vasopneumatic   15 minutes    Vasopnuematic Location   Knee    Vasopneumatic Pressure  Medium      Manual Therapy   Manual Therapy  Passive ROM    Passive ROM  For measurements only today               PT Short Term Goals - 08/07/19 08250     PT SHORT TERM GOAL #1   Title  Patient will be independent with initial HEP    Period  Weeks    Status  Achieved      PT SHORT TERM GOAL #2   Title  Patient will  demonstrate 90+ degrees of left knee flexion AROM to improve ability to perform functional tasks.    Time  3    Period  Weeks    Status  Achieved      PT SHORT TERM GOAL #3   Title  Patient will demonstrate 5 degrees or less of left knee extension AROM to improve gait mechanics.    Time  3    Period  Weeks    Status  Achieved   AROM -5 degrees 08/07/19       PT Long Term Goals - 08/07/19 0840      PT LONG TERM GOAL #1   Title  Patient will be independent with advanced HEP    Time  6    Period  Weeks    Status  Achieved   doing self HEP daily per reported 08/07/19.     PT LONG TERM GOAL #2   Title  Patient will demonstrate 115+ degrees of left knee flexion AROM to improve ability to perform functional tasks.    Time  6    Period  Weeks    Status  Achieved   AROM 116 degrees 08/07/19     PT LONG TERM GOAL #3   Title  Patient will demonstrate 3 degrees or less of left knee extension AROM to improve gait mechanics.    Time  6    Period  Weeks    Status  On-going   AROM -5 degrees 08/07/19     PT LONG TERM GOAL #4   Title  Patient will report ability to perform ADLs and home activities with left knee pain less than or equal to 3/10.    Period  Weeks    Status  Achieved      PT LONG TERM GOAL #5   Title  Patient will demonstrate reciprocating stair negotiation with 1 rail and no AD to safely enter and exit home.    Time  6    Period  Weeks    Status  On-going            Plan - 08/07/19 0850    Clinical Impression Statement  Patient tolerated treatment  well today. Patient has reported that MD has not returned his call regarding the redness in his knee. Patients knee does look better with no drainage and less redness per PT. Patient has overall improved with minimal discomfort, improved ROM and improved strength in left knee. Patient met STG's and LTG #1 and #2 today. Patient is scheduled for his right knee surgery on th 29th. Patient has 1 visit remaining.     Personal Factors and Comorbidities  Age;Comorbidity 2    Comorbidities  L TKA 06/27/2019; HTN, DM    Examination-Activity Limitations  Stand;Stairs;Sit    Examination-Participation Restrictions  Driving    Stability/Clinical Decision Making  Stable/Uncomplicated    Rehab Potential  Good    PT Frequency  3x / week    PT Duration  6 weeks    PT Treatment/Interventions  ADLs/Self Care Home Management;Cryotherapy;Electrical Stimulation;Iontophoresis 13m/ml Dexamethasone;Moist Heat;Balance training;Therapeutic exercise;Therapeutic activities;Functional mobility training;Stair training;Gait training;Neuromuscular re-education;Patient/family education;Passive range of motion;Vasopneumatic Device;Taping;Manual techniques    PT Next Visit Plan  Cont with POC 1 visit assess remaining goals and FOTO    Consulted and Agree with Plan of Care  Patient       Patient will benefit from skilled therapeutic intervention in order to improve the following deficits and impairments:  Decreased activity tolerance, Decreased strength, Decreased range of motion, Difficulty walking, Pain  Visit Diagnosis: Acute pain of left knee  Stiffness of left knee, not elsewhere classified     Problem List Patient Active Problem List   Diagnosis Date Noted  . Osteoarthritis of left knee 06/27/2019    Hershall Benkert P, PTA 08/07/2019, 9:06 AM  [facility] [address]Phone: [phone]  Fax:  [phone] Name: JCorion SherrodMRN: [id number] of Birth: 401-14-1957

## 2019-08-08 ENCOUNTER — Encounter: Payer: Self-pay | Admitting: Physical Therapy

## 2019-08-08 ENCOUNTER — Other Ambulatory Visit: Payer: Self-pay

## 2019-08-08 ENCOUNTER — Ambulatory Visit: Payer: 59 | Admitting: Physical Therapy

## 2019-08-08 DIAGNOSIS — M25562 Pain in left knee: Secondary | ICD-10-CM

## 2019-08-08 DIAGNOSIS — M25662 Stiffness of left knee, not elsewhere classified: Secondary | ICD-10-CM

## 2019-08-08 DIAGNOSIS — Z96651 Presence of right artificial knee joint: Secondary | ICD-10-CM | POA: Insufficient documentation

## 2019-08-08 NOTE — Therapy (Addendum)
[facility] [place], [place], [address] Phone: [phone]   Fax:  [phone]  Physical Therapy Treatment PHYSICAL THERAPY DISCHARGE SUMMARY  Visits from Start of Care: 18  Current functional level related to goals / functional outcomes: See below   Remaining deficits: All goals met   Education / Equipment: HEP Plan: Patient agrees to discharge.  Patient goals were met. Patient is being discharged due to meeting the stated rehab goals.  ?????    Gabriela Eves, PT, DPT   Patient Details  Name: Bryan Atkinson MRN: [id number] Date of Birth: Jul 14, 1956 Referring Provider (PT): Rod Can, MD   Encounter Date: 08/08/2019  PT End of Session - 08/08/19 1327    Visit Number  18    Number of Visits  18    Date for PT Re-Evaluation  08/19/19    Authorization Type  FOTO; progress note every 10th visit    PT Start Time  1300    PT Stop Time  1355    PT Time Calculation (min)  55 min    Activity Tolerance  Patient tolerated treatment well    Behavior During Therapy  [facility] for tasks assessed/performed       Past Medical History:  Diagnosis Date  . Arthritis   . Diabetes mellitus without complication ([place])   . Gout   . History of kidney stones   . Hypertension     Past Surgical History:  Procedure Laterality Date  . CATARACT EXTRACTION W/PHACO Right 05/13/2019   Procedure: CATARACT EXTRACTION PHACO AND INTRAOCULAR LENS PLACEMENT (IOC);  Surgeon: Baruch Goldmann, MD;  Location: [facility];  Service: Ophthalmology;  Laterality: Right;  CDE: 10.31  . CATARACT EXTRACTION W/PHACO Left 05/27/2019   Procedure: CATARACT EXTRACTION PHACO AND INTRAOCULAR LENS PLACEMENT (IOC);  Surgeon: Baruch Goldmann, MD;  Location: [facility];  Service: Ophthalmology;  Laterality: Left;  CDE: 6.49  . COLONOSCOPY     times two 2002 ,2007  . COLONOSCOPY N/A 10/07/2013   Procedure: COLONOSCOPY;  Surgeon: Daneil Dolin, MD;  [facility];  Service: Endoscopy;  Laterality: N/A;  8:30 AM  . COLONOSCOPY N/A 10/05/2018   Procedure: COLONOSCOPY;  Surgeon: Daneil Dolin, MD;  [facility];  Service: Endoscopy;  Laterality: N/A;  8:30  . KNEE ARTHROPLASTY Left 06/27/2019   Procedure: COMPUTER ASSISTED TOTAL KNEE ARTHROPLASTY;  Surgeon: Rod Can, MD;  Location: [facility];  Service: Orthopedics;  Laterality: Left;  . LEG SURGERY Right early 77's  . NO PAST SURGERIES      There were no vitals filed for this visit.  Subjective Assessment - 08/08/19 1324    Subjective  COVID-19 screen performed prior to patient entering clinic. Patient reports no pain and is going to see the MD tomorrow.    Patient is accompained by:  Family member    Pertinent History  left TKA 06/27/2019    Limitations  Sitting;Standing;Walking;House hold activities    How long can you walk comfortably?  room to room    Diagnostic tests  x-ray    Patient Stated Goals  decrease pain, return to normal activities.    Currently in Pain?  No/denies         [facility] PT Assessment - 08/08/19 0001      Assessment   Medical Diagnosis  osteoarthritis of left knee    Referring Provider (PT)  Rod Can, MD    Onset Date/Surgical Date  06/27/19    Next  MD Visit  08/09/2019    Prior Therapy  in hospital      Precautions   Precautions  Other (comment)    Precaution Comments  no ultrasound      Observation/Other Assessments   Focus on Therapeutic Outcomes (FOTO)   31% limited                   [facility] Adult PT Treatment/Exercise - 08/08/19 0001      Ambulation/Gait   Stairs  Yes    Stairs Assistance  7: Independent    Stair Management Technique  No rails    Number of Stairs  4   x2   Height of Stairs  6.5      Knee/Hip Exercises: Aerobic   Stationary Bike  Level 4 x 15 minutes.      Knee/Hip Exercises: Machines for Strengthening   Cybex Knee Extension  10# 4x10 reps    Cybex Knee Flexion  30# 4x10 reps    Cybex Leg  Press  3 pl, seat 6 x40 reps      Modalities   Modalities  Electrical Stimulation;Vasopneumatic      Electrical Stimulation   Electrical Stimulation Location  L knee    Electrical Stimulation Action  IFC    Electrical Stimulation Parameters  80-150 hz x10 mins    Electrical Stimulation Goals  Pain      Vasopneumatic   Number Minutes Vasopneumatic   10 minutes    Vasopnuematic Location   Knee    Vasopneumatic Pressure  Medium    Vasopneumatic Temperature   34               PT Short Term Goals - 08/07/19 4034      PT SHORT TERM GOAL #1   Title  Patient will be independent with initial HEP    Period  Weeks    Status  Achieved      PT SHORT TERM GOAL #2   Title  Patient will demonstrate 90+ degrees of left knee flexion AROM to improve ability to perform functional tasks.    Time  3    Period  Weeks    Status  Achieved      PT SHORT TERM GOAL #3   Title  Patient will demonstrate 5 degrees or less of left knee extension AROM to improve gait mechanics.    Time  3    Period  Weeks    Status  Achieved   AROM -5 degrees 08/07/19       PT Long Term Goals - 08/08/19 1400      PT LONG TERM GOAL #1   Title  Patient will be independent with advanced HEP    Time  6    Period  Weeks    Status  Achieved      PT LONG TERM GOAL #2   Title  Patient will demonstrate 115+ degrees of left knee flexion AROM to improve ability to perform functional tasks.    Time  6    Period  Weeks    Status  Achieved   116 degrees     PT LONG TERM GOAL #3   Title  Patient will demonstrate 3 degrees or less of left knee extension AROM to improve gait mechanics.    Time  6    Period  Weeks    Status  Achieved   3 degrees     PT LONG TERM GOAL #4   Title  Patient will report ability to perform ADLs and home activities with left knee pain less than or equal to 3/10.    Time  6    Period  Weeks    Status  Achieved      PT LONG TERM GOAL #5   Title  Patient will demonstrate  reciprocating stair negotiation with 1 rail and no AD to safely enter and exit home.    Time  6    Period  Weeks    Status  Achieved            Plan - 08/08/19 1356    Clinical Impression Statement  Patient responded well today with no complaints of pain. Patient was able to demonstrate reciprocating stair negotiation with no UE support. Patient provided with heel prop exercise to gain last few degrees of extension. extension measured at 3 degrees. Patient DC discharged with all goals met.    Personal Factors and Comorbidities  Age;Comorbidity 2    Comorbidities  L TKA 06/27/2019; HTN, DM    Examination-Activity Limitations  Stand;Stairs;Sit    Examination-Participation Restrictions  Driving    Stability/Clinical Decision Making  Stable/Uncomplicated    Clinical Decision Making  Low    Rehab Potential  Good    PT Frequency  3x / week    PT Duration  6 weeks    PT Treatment/Interventions  ADLs/Self Care Home Management;Cryotherapy;Electrical Stimulation;Iontophoresis 26m/ml Dexamethasone;Moist Heat;Balance training;Therapeutic exercise;Therapeutic activities;Functional mobility training;Stair training;Gait training;Neuromuscular re-education;Patient/family education;Passive range of motion;Vasopneumatic Device;Taping;Manual techniques    PT Next Visit Plan  DC    Consulted and Agree with Plan of Care  Patient       Patient will benefit from skilled therapeutic intervention in order to improve the following deficits and impairments:  Decreased activity tolerance, Decreased strength, Decreased range of motion, Difficulty walking, Pain  Visit Diagnosis: Acute pain of left knee  Stiffness of left knee, not elsewhere classified     Problem List Patient Active Problem List   Diagnosis Date Noted  . Osteoarthritis of left knee 06/27/2019    KGabriela Eves PT, DPT 08/08/2019, 2:04 PM  [facility] [address]  [address]Phone: [phone]  Fax:  [phone] Name: Bryan MachnikMRN: [id number] of Birth: 4March 21, 1957

## 2019-08-08 NOTE — Patient Instructions (Addendum)
DUE TO COVID-19 ONLY ONE VISITOR IS ALLOWED TO COME WITH YOU AND STAY IN THE WAITING ROOM ONLY DURING PRE OP AND PROCEDURE DAY OF SURGERY. THE 1 VISITOR MAY VISIT WITH YOU AFTER SURGERY IN YOUR PRIVATE ROOM DURING VISITING HOURS ONLY!  YOU HAD A COVID 19 TEST ON 08-12-19, PLEASE BEGIN THE QUARANTINE INSTRUCTIONS AS OUTLINED IN YOUR HANDOUT.                Joby Wint Gruszka  08/08/2019   Your procedure is scheduled on:    Report to [facility] Main  Entrance    Report to Admitting at  5:30 AM     Call this number if you have problems the morning of surgery [phone]    Remember: NO SOLID FOOD AFTER MIDNIGHT THE NIGHT PRIOR TO SURGERY. NOTHING BY MOUTH EXCEPT CLEAR LIQUIDS UNTIL 4:30 AM . PLEASE FINISH G2 DRINK PER SURGEON ORDER  WHICH NEEDS TO BE COMPLETED AT 4:30 AM.   CLEAR LIQUID DIET   Foods Allowed                                                                     Foods Excluded  Coffee and tea, regular and decaf                             liquids that you cannot  Plain Jell-O any favor except red or purple                                           see through such as: Fruit ices (not with fruit pulp)                                     milk, soups, orange juice  Iced Popsicles                                    All solid food Carbonated beverages, regular and diet                                    Cranberry, grape and apple juices Sports drinks like Gatorade Lightly seasoned clear broth or consume(fat free) Sugar, honey syrup   _____________________________________________________________________       Take these medicines the morning of surgery with A SIP OF WATER: `Allopurinol (Zyloprim), Amlodipine (Norvasc), Atenolol (Tenormin), and Fenofibrate (Tricor)  DO NOT TAKE ANY DIABETIC MEDICATIONS DAY OF YOUR SURGERY                               You may not have any metal on your body including hair pins and              piercings     Do not wear  jewelry, make-up, lotions, powders or perfumes, deodorant  Do not wear nail polish on your fingernails.  Do not shave  48 hours prior to surgery.          Do not bring valuables to the hospital. [place].  Contacts, dentures or bridgework may not be worn into surgery.  Leave suitcase in the car. After surgery it may be brought to your room.       Special Instructions: N/A              Please read over the following fact sheets you were given: _____________________________________________________________________  How to Manage Your Diabetes Before and After Surgery  Why is it important to control my blood sugar before and after surgery? . Improving blood sugar levels before and after surgery helps healing and can limit problems. . A way of improving blood sugar control is eating a healthy diet by: o  Eating less sugar and carbohydrates o  Increasing activity/exercise o  Talking with your doctor about reaching your blood sugar goals . High blood sugars (greater than 180 mg/dL) can raise your risk of infections and slow your recovery, so you will need to focus on controlling your diabetes during the weeks before surgery. . Make sure that the doctor who takes care of your diabetes knows about your planned surgery including the date and location.  How do I manage my blood sugar before surgery? . Check your blood sugar at least 4 times a day, starting 2 days before surgery, to make sure that the level is not too high or low. o Check your blood sugar the morning of your surgery when you wake up and every 2 hours until you get to the Short Stay unit. . If your blood sugar is less than 70 mg/dL, you will need to treat for low blood sugar: o Do not take insulin. o Treat a low blood sugar (less than 70 mg/dL) with  cup of clear juice (cranberry or apple), 4 glucose tablets, OR glucose gel. o Recheck blood sugar in 15 minutes after  treatment (to make sure it is greater than 70 mg/dL). If your blood sugar is not greater than 70 mg/dL on recheck, call [phone] for further instructions. . Report your blood sugar to the short stay nurse when you get to Short Stay.  . If you are admitted to the hospital after surgery: o Your blood sugar will be checked by the staff and you will probably be given insulin after surgery (instead of oral diabetes medicines) to make sure you have good blood sugar levels. o The goal for blood sugar control after surgery is 80-180 mg/dL.   WHAT DO I DO ABOUT MY DIABETES MEDICATION?  Marland Kitchen Do not take oral diabetes medicines (pills) the morning of surgery.  . THE DAY BEFORE SURGERY, take your usual dose of Metformin                [place] - Preparing for Surgery Before surgery, you can play an important role.  Because skin is not sterile, your skin needs to be as free of germs as possible.  You can reduce the number of germs on your skin by washing with CHG (chlorahexidine gluconate) soap before surgery.  CHG is an antiseptic cleaner which kills germs and bonds with the skin to continue killing germs even after washing. Please DO NOT use if you have an allergy to CHG or  antibacterial soaps.  If your skin becomes reddened/irritated stop using the CHG and inform your nurse when you arrive at Short Stay. Do not shave (including legs and underarms) for at least 48 hours prior to the first CHG shower.  You may shave your face/neck. Please follow these instructions carefully:  1.  Shower with CHG Soap the night before surgery and the  morning of Surgery.  2.  If you choose to wash your hair, wash your hair first as usual with your  normal  shampoo.  3.  After you shampoo, rinse your hair and body thoroughly to remove the  shampoo.                           4.  Use CHG as you would any other liquid soap.  You can apply chg directly  to the skin and wash                       Gently with a scrungie or  clean washcloth.  5.  Apply the CHG Soap to your body ONLY FROM THE NECK DOWN.   Do not use on face/ open                           Wound or open sores. Avoid contact with eyes, ears mouth and genitals (private parts).                       Wash face,  Genitals (private parts) with your normal soap.             6.  Wash thoroughly, paying special attention to the area where your surgery  will be performed.  7.  Thoroughly rinse your body with warm water from the neck down.  8.  DO NOT shower/wash with your normal soap after using and rinsing off  the CHG Soap.                9.  Pat yourself dry with a clean towel.            10.  Wear clean pajamas.            11.  Place clean sheets on your bed the night of your first shower and do not  sleep with pets. Day of Surgery : Do not apply any lotions/deodorants the morning of surgery.  Please wear clean clothes to the hospital/surgery center.  FAILURE TO FOLLOW THESE INSTRUCTIONS MAY RESULT IN THE CANCELLATION OF YOUR SURGERY PATIENT SIGNATURE_________________________________  NURSE SIGNATURE__________________________________  ________________________________________________________________________   Adam Phenix  An incentive spirometer is a tool that can help keep your lungs clear and active. This tool measures how well you are filling your lungs with each breath. Taking long deep breaths may help reverse or decrease the chance of developing breathing (pulmonary) problems (especially infection) following:  A long period of time when you are unable to move or be active. BEFORE THE PROCEDURE   If the spirometer includes an indicator to show your best effort, your nurse or respiratory therapist will set it to a desired goal.  If possible, sit up straight or lean slightly forward. Try not to slouch.  Hold the incentive spirometer in an upright position. INSTRUCTIONS FOR USE  1. Sit on the edge of your bed if possible, or sit up as  far as you can in  bed or on a chair. 2. Hold the incentive spirometer in an upright position. 3. Breathe out normally. 4. Place the mouthpiece in your mouth and seal your lips tightly around it. 5. Breathe in slowly and as deeply as possible, raising the piston or the ball toward the top of the column. 6. Hold your breath for 3-5 seconds or for as long as possible. Allow the piston or ball to fall to the bottom of the column. 7. Remove the mouthpiece from your mouth and breathe out normally. 8. Rest for a few seconds and repeat Steps 1 through 7 at least 10 times every 1-2 hours when you are awake. Take your time and take a few normal breaths between deep breaths. 9. The spirometer may include an indicator to show your best effort. Use the indicator as a goal to work toward during each repetition. 10. After each set of 10 deep breaths, practice coughing to be sure your lungs are clear. If you have an incision (the cut made at the time of surgery), support your incision when coughing by placing a pillow or rolled up towels firmly against it. Once you are able to get out of bed, walk around indoors and cough well. You may stop using the incentive spirometer when instructed by your caregiver.  RISKS AND COMPLICATIONS  Take your time so you do not get dizzy or light-headed.  If you are in pain, you may need to take or ask for pain medication before doing incentive spirometry. It is harder to take a deep breath if you are having pain. AFTER USE  Rest and breathe slowly and easily.  It can be helpful to keep track of a log of your progress. Your caregiver can provide you with a simple table to help with this. If you are using the spirometer at home, follow these instructions: [place] IF:   You are having difficultly using the spirometer.  You have trouble using the spirometer as often as instructed.  Your pain medication is not giving enough relief while using the spirometer.  You  develop fever of 100.5 F (38.1 C) or higher. SEEK IMMEDIATE MEDICAL CARE IF:   You cough up bloody sputum that had not been present before.  You develop fever of 102 F (38.9 C) or greater.  You develop worsening pain at or near the incision site. MAKE SURE YOU:   Understand these instructions.  Will watch your condition.  Will get help right away if you are not doing well or get worse. Document Released: 02/13/2007 Document Revised: 12/26/2011 Document Reviewed: 04/16/2007 Exit[employer] Patient Information 2014 Exit[employer], [place].   ________________________________________________________________________  WHAT IS A BLOOD TRANSFUSION? Blood Transfusion Information  A transfusion is the replacement of blood or some of its parts. Blood is made up of multiple cells which provide different functions.  Red blood cells carry oxygen and are used for blood loss replacement.  White blood cells fight against infection.  Platelets control bleeding.  Plasma helps clot blood.  Other blood products are available for specialized needs, such as hemophilia or other clotting disorders. BEFORE THE TRANSFUSION  Who gives blood for transfusions?   Healthy volunteers who are fully evaluated to make sure their blood is safe. This is blood bank blood. Transfusion therapy is the safest it has ever been in the practice of medicine. Before blood is taken from a donor, a complete history is taken to make sure that person has no history of diseases nor engages in risky  social behavior (examples are intravenous drug use or sexual activity with multiple partners). The donor's travel history is screened to minimize risk of transmitting infections, such as malaria. The donated blood is tested for signs of infectious diseases, such as HIV and hepatitis. The blood is then tested to be sure it is compatible with you in order to minimize the chance of a transfusion reaction. If you or a relative donates blood, this is  often done in anticipation of surgery and is not appropriate for emergency situations. It takes many days to process the donated blood. RISKS AND COMPLICATIONS Although transfusion therapy is very safe and saves many lives, the main dangers of transfusion include:   Getting an infectious disease.  Developing a transfusion reaction. This is an allergic reaction to something in the blood you were given. Every precaution is taken to prevent this. The decision to have a blood transfusion has been considered carefully by your caregiver before blood is given. Blood is not given unless the benefits outweigh the risks. AFTER THE TRANSFUSION  Right after receiving a blood transfusion, you will usually feel much better and more energetic. This is especially true if your red blood cells have gotten low (anemic). The transfusion raises the level of the red blood cells which carry oxygen, and this usually causes an energy increase.  The nurse administering the transfusion will monitor you carefully for complications. HOME CARE INSTRUCTIONS  No special instructions are needed after a transfusion. You may find your energy is better. Speak with your caregiver about any limitations on activity for underlying diseases you may have. SEEK MEDICAL CARE IF:   Your condition is not improving after your transfusion.  You develop redness or irritation at the intravenous (IV) site. SEEK IMMEDIATE MEDICAL CARE IF:  Any of the following symptoms occur over the next 12 hours:  Shaking chills.  You have a temperature by mouth above 102 F (38.9 C), not controlled by medicine.  Chest, back, or muscle pain.  People around you feel you are not acting correctly or are confused.  Shortness of breath or difficulty breathing.  Dizziness and fainting.  You get a rash or develop hives.  You have a decrease in urine output.  Your urine turns a dark color or changes to pink, red, or brown. Any of the following  symptoms occur over the next 10 days:  You have a temperature by mouth above 102 F (38.9 C), not controlled by medicine.  Shortness of breath.  Weakness after normal activity.  The white part of the eye turns yellow (jaundice).  You have a decrease in the amount of urine or are urinating less often.  Your urine turns a dark color or changes to pink, red, or brown. Document Released: 09/30/2000 Document Revised: 12/26/2011 Document Reviewed: 05/19/2008 [facility] Patient Information 2014 [place], [place].  _______________________________________________________________________

## 2019-08-09 ENCOUNTER — Ambulatory Visit: Payer: Self-pay | Admitting: Orthopedic Surgery

## 2019-08-09 ENCOUNTER — Other Ambulatory Visit (HOSPITAL_COMMUNITY): Payer: 59

## 2019-08-09 ENCOUNTER — Encounter: Payer: 59 | Admitting: Physical Therapy

## 2019-08-09 NOTE — H&P (View-Only) (Signed)
TOTAL KNEE ADMISSION H&P  Patient is being admitted for right total knee arthroplasty.  Subjective:  Chief Complaint:right knee pain.  HPI: Bryan Atkinson, 63 y.o. male, has a history of pain and functional disability in the right knee due to arthritis and has failed non-surgical conservative treatments for greater than 12 weeks to includeNSAID's and/or analgesics, corticosteriod injections, viscosupplementation injections, flexibility and strengthening excercises, supervised PT with diminished ADL's post treatment, use of assistive devices and activity modification.  Onset of symptoms was gradual, starting 5 years ago with gradually worsening course since that time. The patient noted no past surgery on the right knee(s).  Patient currently rates pain in the right knee(s) at 10 out of 10 with activity. Patient has night pain, worsening of pain with activity and weight bearing, pain that interferes with activities of daily living, pain with passive range of motion, crepitus and joint swelling.  Patient has evidence of subchondral cysts, subchondral sclerosis, periarticular osteophytes, joint subluxation and joint space narrowing by imaging studies. There is no active infection.  Patient Active Problem List   Diagnosis Date Noted  . Osteoarthritis of left knee 06/27/2019   Past Medical History:  Diagnosis Date  . Arthritis   . Diabetes mellitus without complication ([place])   . Gout   . History of kidney stones   . Hypertension     Past Surgical History:  Procedure Laterality Date  . CATARACT EXTRACTION W/PHACO Right 05/13/2019   Procedure: CATARACT EXTRACTION PHACO AND INTRAOCULAR LENS PLACEMENT (IOC);  Surgeon: Baruch Goldmann, MD;  Location: [facility];  Service: Ophthalmology;  Laterality: Right;  CDE: 10.31  . CATARACT EXTRACTION W/PHACO Left 05/27/2019   Procedure: CATARACT EXTRACTION PHACO AND INTRAOCULAR LENS PLACEMENT (IOC);  Surgeon: Baruch Goldmann, MD;  Location: [facility];  Service:  Ophthalmology;  Laterality: Left;  CDE: 6.49  . COLONOSCOPY     times two 2002 ,2007  . COLONOSCOPY N/A 10/07/2013   Procedure: COLONOSCOPY;  Surgeon: Daneil Dolin, MD;  Location: [facility] SU[facility];  Service: Endoscopy;  Laterality: N/A;  8:30 AM  . COLONOSCOPY N/A 10/05/2018   Procedure: COLONOSCOPY;  Surgeon: Daneil Dolin, MD;  Location: [facility];  Service: Endoscopy;  Laterality: N/A;  8:30  . KNEE ARTHROPLASTY Left 06/27/2019   Procedure: COMPUTER ASSISTED TOTAL KNEE ARTHROPLASTY;  Surgeon: Rod Can, MD;  Location: [facility];  Service: Orthopedics;  Laterality: Left;  . LEG SURGERY Right early 23's  . NO PAST SURGERIES      Current Outpatient Medications  Medication Sig Dispense Refill Last Dose  . allopurinol (ZYLOPRIM) 300 MG tablet Take 300 mg by mouth daily.   06/27/2019 at 0420  . amLODipine (NORVASC) 10 MG tablet Take 10 mg by mouth daily.    06/27/2019 at 0420  . Apple Cider Vinegar 500 MG TABS Take 500 mg by mouth daily.    06/25/2019  . aspirin 81 MG chewable tablet Chew 1 tablet (81 mg total) by mouth 2 (two) times daily. 60 tablet 1   . atenolol (TENORMIN) 50 MG tablet Take 50 mg by mouth 2 (two) times daily.    06/27/2019 at 0420  . Co-Enzyme Q10 100 MG CAPS Take 200 mg by mouth daily.     . Cranberry 500 MG TABS Take 500 mg by mouth daily.     Marland Kitchen docusate sodium (COLACE) 100 MG capsule Take 1 capsule (100 mg total) by mouth 2 (two) times daily. (Patient not taking: Reported on 08/07/2019) 60 capsule 1 Not Taking  at Unknown time  . fenofibrate (TRICOR) 145 MG tablet Take 145 mg by mouth daily.   06/26/2019  . Flaxseed, Linseed, (FLAXSEED OIL) 1000 MG CAPS Take 1,000 mg by mouth daily.     Marland Kitchen gabapentin (NEURONTIN) 300 MG capsule Take 1 capsule (300 mg total) by mouth at bedtime. (Patient not taking: Reported on 08/07/2019) 14 capsule 0 Not Taking at Unknown time  . glucose blood test strip 1 each by Other route as needed for other. Use as instructed   supply  .  HYDROcodone-acetaminophen (NORCO/VICODIN) 5-325 MG tablet Take 1 tablet by mouth every 4 (four) hours as needed for moderate pain (pain score 4-6). 42 tablet 0   . Krill Oil 500 MG CAPS Take 500 mg by mouth 2 (two) times a day.   06/25/2019  . lisinopril-hydrochlorothiazide (PRINZIDE,ZESTORETIC) 20-25 MG per tablet Take 1 tablet by mouth 2 (two) times daily.    06/26/2019 at Unknown time  . metFORMIN (GLUCOPHAGE) 1000 MG tablet Take 1,000 mg by mouth 2 (two) times daily.  5 06/26/2019 at Unknown time  . Multiple Vitamin (MULTIVITAMIN) tablet Take 1 tablet by mouth daily.   06/25/2019  . naproxen (NAPROSYN) 500 MG tablet Take 500 mg by mouth 2 (two) times daily.     . niacin (NIASPAN) 1000 MG CR tablet Take 1,000 mg by mouth at bedtime.   06/26/2019 at Unknown time  . ondansetron (ZOFRAN) 4 MG tablet Take 1 tablet (4 mg total) by mouth every 6 (six) hours as needed for nausea. 20 tablet 0   . pravastatin (PRAVACHOL) 40 MG tablet Take 40 mg by mouth at bedtime.    06/26/2019 at Unknown time  . senna (SENOKOT) 8.6 MG TABS tablet Take 1 tablet (8.6 mg total) by mouth 2 (two) times daily. (Patient not taking: Reported on 08/07/2019) 120 tablet 0 Not Taking at Unknown time  . Turmeric 500 MG CAPS Take 500 mg by mouth daily.   06/25/2019   No current facility-administered medications for this visit.    No Known Allergies  Social History   Tobacco Use  . Smoking status: Never Smoker  . Smokeless tobacco: Never Used  Substance Use Topics  . Alcohol use: No    Family History  Problem Relation Age of Onset  . Colon cancer Mother        She died from her malignancy, 40  . Lung cancer Father        Died from lung cancer, age 24  . Emphysema Brother        Died at 32 from lung disease     Review of Systems  Constitutional: Negative.   HENT: Negative.   Eyes: Negative.   Respiratory: Negative.   Cardiovascular: Negative.   Gastrointestinal: Negative.   Genitourinary: Negative.   Musculoskeletal: Positive  for joint pain.  Skin: Negative.   Neurological: Negative.   Endo/Heme/Allergies: Negative.   Psychiatric/Behavioral: Negative.     Objective:  Physical Exam  Vitals reviewed. Constitutional: He is oriented to person, place, and time. He appears well-developed and well-nourished.  HENT:  Head: Normocephalic and atraumatic.  Eyes: Pupils are equal, round, and reactive to light. Conjunctivae and EOM are normal.  Neck: Normal range of motion. Neck supple.  Cardiovascular: Normal rate, regular rhythm and intact distal pulses.  Respiratory: Effort normal. No respiratory distress.  GI: Soft. He exhibits no distension.  Genitourinary:    Genitourinary Comments: deferred   Musculoskeletal:     Right knee: He exhibits decreased range  of motion, swelling, effusion and abnormal alignment. Tenderness found. Medial joint line and lateral joint line tenderness noted.  Neurological: He is alert and oriented to person, place, and time. He has normal reflexes.  Skin: Skin is warm and dry.  Psychiatric: He has a normal mood and affect. His behavior is normal. Judgment and thought content normal.    Vital signs in last 24 hours: @VSRANGES @  Labs:   Estimated body mass index is 33.08 kg/m as calculated from the following:   Height as of 06/27/19: 5\' 9"  (1.753 m).   Weight as of 06/27/19: 101.6 kg.   Imaging Review Plain radiographs demonstrate severe degenerative joint disease of the right knee(s). The overall alignment issignificant valgus. The bone quality appears to be adequate for age and reported activity level.      Assessment/Plan:  End stage arthritis, right knee   The patient history, physical examination, clinical judgment of the provider and imaging studies are consistent with end stage degenerative joint disease of the right knee(s) and total knee arthroplasty is deemed medically necessary. The treatment options including medical management, injection therapy arthroscopy and  arthroplasty were discussed at length. The risks and benefits of total knee arthroplasty were presented and reviewed. The risks due to aseptic loosening, infection, stiffness, patella tracking problems, thromboembolic complications and other imponderables were discussed. The patient acknowledged the explanation, agreed to proceed with the plan and consent was signed. Patient is being admitted for inpatient treatment for surgery, pain control, PT, OT, prophylactic antibiotics, VTE prophylaxis, progressive ambulation and ADL's and discharge planning. The patient is planning to be discharged home with OPPT     Patient's anticipated LOS is less than 2 midnights, meeting these requirements: - Younger than 45 - Lives within 1 hour of care - Has a competent adult at home to recover with post-op recover - NO history of  - Chronic pain requiring opiods  - Diabetes  - Coronary Artery Disease  - Heart failure  - Heart attack  - Stroke  - DVT/VTE  - Cardiac arrhythmia  - Respiratory Failure/COPD  - Renal failure  - Anemia  - Advanced Liver disease

## 2019-08-09 NOTE — H&P (Signed)
TOTAL KNEE ADMISSION H&P  Patient is being admitted for right total knee arthroplasty.  Subjective:  Chief Complaint:right knee pain.  HPI: Bryan Atkinson, 63 y.o. male, has a history of pain and functional disability in the right knee due to arthritis and has failed non-surgical conservative treatments for greater than 12 weeks to includeNSAID's and/or analgesics, corticosteriod injections, viscosupplementation injections, flexibility and strengthening excercises, supervised PT with diminished ADL's post treatment, use of assistive devices and activity modification.  Onset of symptoms was gradual, starting 5 years ago with gradually worsening course since that time. The patient noted no past surgery on the right knee(s).  Patient currently rates pain in the right knee(s) at 10 out of 10 with activity. Patient has night pain, worsening of pain with activity and weight bearing, pain that interferes with activities of daily living, pain with passive range of motion, crepitus and joint swelling.  Patient has evidence of subchondral cysts, subchondral sclerosis, periarticular osteophytes, joint subluxation and joint space narrowing by imaging studies. There is no active infection.  Patient Active Problem List   Diagnosis Date Noted  . Osteoarthritis of left knee 06/27/2019   Past Medical History:  Diagnosis Date  . Arthritis   . Diabetes mellitus without complication ([place])   . Gout   . History of kidney stones   . Hypertension     Past Surgical History:  Procedure Laterality Date  . CATARACT EXTRACTION W/PHACO Right 05/13/2019   Procedure: CATARACT EXTRACTION PHACO AND INTRAOCULAR LENS PLACEMENT (IOC);  Surgeon: Baruch Goldmann, MD;  Location: [facility];  Service: Ophthalmology;  Laterality: Right;  CDE: 10.31  . CATARACT EXTRACTION W/PHACO Left 05/27/2019   Procedure: CATARACT EXTRACTION PHACO AND INTRAOCULAR LENS PLACEMENT (IOC);  Surgeon: Baruch Goldmann, MD;  Location: [facility];  Service:  Ophthalmology;  Laterality: Left;  CDE: 6.49  . COLONOSCOPY     times two 2002 ,2007  . COLONOSCOPY N/A 10/07/2013   Procedure: COLONOSCOPY;  Surgeon: Daneil Dolin, MD;  Location: [facility];  Service: Endoscopy;  Laterality: N/A;  8:30 AM  . COLONOSCOPY N/A 10/05/2018   Procedure: COLONOSCOPY;  Surgeon: Daneil Dolin, MD;  Location: [facility];  Service: Endoscopy;  Laterality: N/A;  8:30  . KNEE ARTHROPLASTY Left 06/27/2019   Procedure: COMPUTER ASSISTED TOTAL KNEE ARTHROPLASTY;  Surgeon: Rod Can, MD;  Location: [facility];  Service: Orthopedics;  Laterality: Left;  . LEG SURGERY Right early 50's  . NO PAST SURGERIES      Current Outpatient Medications  Medication Sig Dispense Refill Last Dose  . allopurinol (ZYLOPRIM) 300 MG tablet Take 300 mg by mouth daily.   06/27/2019 at 0420  . amLODipine (NORVASC) 10 MG tablet Take 10 mg by mouth daily.    06/27/2019 at 0420  . Apple Cider Vinegar 500 MG TABS Take 500 mg by mouth daily.    06/25/2019  . aspirin 81 MG chewable tablet Chew 1 tablet (81 mg total) by mouth 2 (two) times daily. 60 tablet 1   . atenolol (TENORMIN) 50 MG tablet Take 50 mg by mouth 2 (two) times daily.    06/27/2019 at 0420  . Co-Enzyme Q10 100 MG CAPS Take 200 mg by mouth daily.     . Cranberry 500 MG TABS Take 500 mg by mouth daily.     Marland Kitchen docusate sodium (COLACE) 100 MG capsule Take 1 capsule (100 mg total) by mouth 2 (two) times daily. (Patient not taking: Reported on 08/07/2019) 60 capsule 1 Not Taking  at Unknown time  . fenofibrate (TRICOR) 145 MG tablet Take 145 mg by mouth daily.   06/26/2019  . Flaxseed, Linseed, (FLAXSEED OIL) 1000 MG CAPS Take 1,000 mg by mouth daily.     Marland Kitchen gabapentin (NEURONTIN) 300 MG capsule Take 1 capsule (300 mg total) by mouth at bedtime. (Patient not taking: Reported on 08/07/2019) 14 capsule 0 Not Taking at Unknown time  . glucose blood test strip 1 each by Other route as needed for other. Use as instructed   supply  .  HYDROcodone-acetaminophen (NORCO/VICODIN) 5-325 MG tablet Take 1 tablet by mouth every 4 (four) hours as needed for moderate pain (pain score 4-6). 42 tablet 0   . Krill Oil 500 MG CAPS Take 500 mg by mouth 2 (two) times a day.   06/25/2019  . lisinopril-hydrochlorothiazide (PRINZIDE,ZESTORETIC) 20-25 MG per tablet Take 1 tablet by mouth 2 (two) times daily.    06/26/2019 at Unknown time  . metFORMIN (GLUCOPHAGE) 1000 MG tablet Take 1,000 mg by mouth 2 (two) times daily.  5 06/26/2019 at Unknown time  . Multiple Vitamin (MULTIVITAMIN) tablet Take 1 tablet by mouth daily.   06/25/2019  . naproxen (NAPROSYN) 500 MG tablet Take 500 mg by mouth 2 (two) times daily.     . niacin (NIASPAN) 1000 MG CR tablet Take 1,000 mg by mouth at bedtime.   06/26/2019 at Unknown time  . ondansetron (ZOFRAN) 4 MG tablet Take 1 tablet (4 mg total) by mouth every 6 (six) hours as needed for nausea. 20 tablet 0   . pravastatin (PRAVACHOL) 40 MG tablet Take 40 mg by mouth at bedtime.    06/26/2019 at Unknown time  . senna (SENOKOT) 8.6 MG TABS tablet Take 1 tablet (8.6 mg total) by mouth 2 (two) times daily. (Patient not taking: Reported on 08/07/2019) 120 tablet 0 Not Taking at Unknown time  . Turmeric 500 MG CAPS Take 500 mg by mouth daily.   06/25/2019   No current facility-administered medications for this visit.    No Known Allergies  Social History   Tobacco Use  . Smoking status: Never Smoker  . Smokeless tobacco: Never Used  Substance Use Topics  . Alcohol use: No    Family History  Problem Relation Age of Onset  . Colon cancer Mother        She died from her malignancy, 44  . Lung cancer Father        Died from lung cancer, age 55  . Emphysema Brother        Died at 28 from lung disease     Review of Systems  Constitutional: Negative.   HENT: Negative.   Eyes: Negative.   Respiratory: Negative.   Cardiovascular: Negative.   Gastrointestinal: Negative.   Genitourinary: Negative.   Musculoskeletal: Positive  for joint pain.  Skin: Negative.   Neurological: Negative.   Endo/Heme/Allergies: Negative.   Psychiatric/Behavioral: Negative.     Objective:  Physical Exam  Vitals reviewed. Constitutional: He is oriented to person, place, and time. He appears well-developed and well-nourished.  HENT:  Head: Normocephalic and atraumatic.  Eyes: Pupils are equal, round, and reactive to light. Conjunctivae and EOM are normal.  Neck: Normal range of motion. Neck supple.  Cardiovascular: Normal rate, regular rhythm and intact distal pulses.  Respiratory: Effort normal. No respiratory distress.  GI: Soft. He exhibits no distension.  Genitourinary:    Genitourinary Comments: deferred   Musculoskeletal:     Right knee: He exhibits decreased range  of motion, swelling, effusion and abnormal alignment. Tenderness found. Medial joint line and lateral joint line tenderness noted.  Neurological: He is alert and oriented to person, place, and time. He has normal reflexes.  Skin: Skin is warm and dry.  Psychiatric: He has a normal mood and affect. His behavior is normal. Judgment and thought content normal.    Vital signs in last 24 hours: @VSRANGES @  Labs:   Estimated body mass index is 33.08 kg/m as calculated from the following:   Height as of 06/27/19: 5\' 9"  (1.753 m).   Weight as of 06/27/19: 101.6 kg.   Imaging Review Plain radiographs demonstrate severe degenerative joint disease of the right knee(s). The overall alignment issignificant valgus. The bone quality appears to be adequate for age and reported activity level.      Assessment/Plan:  End stage arthritis, right knee   The patient history, physical examination, clinical judgment of the provider and imaging studies are consistent with end stage degenerative joint disease of the right knee(s) and total knee arthroplasty is deemed medically necessary. The treatment options including medical management, injection therapy arthroscopy and  arthroplasty were discussed at length. The risks and benefits of total knee arthroplasty were presented and reviewed. The risks due to aseptic loosening, infection, stiffness, patella tracking problems, thromboembolic complications and other imponderables were discussed. The patient acknowledged the explanation, agreed to proceed with the plan and consent was signed. Patient is being admitted for inpatient treatment for surgery, pain control, PT, OT, prophylactic antibiotics, VTE prophylaxis, progressive ambulation and ADL's and discharge planning. The patient is planning to be discharged home with OPPT     Patient's anticipated LOS is less than 2 midnights, meeting these requirements: - Younger than 60 - Lives within 1 hour of care - Has a competent adult at home to recover with post-op recover - NO history of  - Chronic pain requiring opiods  - Diabetes  - Coronary Artery Disease  - Heart failure  - Heart attack  - Stroke  - DVT/VTE  - Cardiac arrhythmia  - Respiratory Failure/COPD  - Renal failure  - Anemia  - Advanced Liver disease

## 2019-08-12 ENCOUNTER — Other Ambulatory Visit (HOSPITAL_COMMUNITY)
Admission: RE | Admit: 2019-08-12 | Discharge: 2019-08-12 | Disposition: A | Discharge status: Home / Self Care | Payer: 59 | Source: Ambulatory Visit | Attending: Orthopedic Surgery | Admitting: Orthopedic Surgery

## 2019-08-12 ENCOUNTER — Encounter (HOSPITAL_COMMUNITY)
Admission: RE | Admit: 2019-08-12 | Discharge: 2019-08-12 | Disposition: A | Discharge status: Home / Self Care | Payer: 59 | Source: Ambulatory Visit | Attending: Orthopedic Surgery | Admitting: Orthopedic Surgery

## 2019-08-12 ENCOUNTER — Encounter (HOSPITAL_COMMUNITY): Payer: Self-pay

## 2019-08-12 ENCOUNTER — Other Ambulatory Visit: Payer: Self-pay

## 2019-08-12 DIAGNOSIS — Z01812 Encounter for preprocedural laboratory examination: Secondary | ICD-10-CM | POA: Insufficient documentation

## 2019-08-12 DIAGNOSIS — Z20828 Contact with and (suspected) exposure to other viral communicable diseases: Secondary | ICD-10-CM | POA: Insufficient documentation

## 2019-08-12 LAB — COMPREHENSIVE METABOLIC PANEL
ALT: 16 U/L (ref 0–44)
AST: 19 U/L (ref 15–41)
Albumin: 4 g/dL (ref 3.5–5.0)
Alkaline Phosphatase: 40 U/L (ref 38–126)
Anion gap: 11 (ref 5–15)
BUN: 32 mg/dL — ABNORMAL HIGH (ref 8–23)
CO2: 21 mmol/L — ABNORMAL LOW (ref 22–32)
Calcium: 10.8 mg/dL — ABNORMAL HIGH (ref 8.9–10.3)
Chloride: 104 mmol/L (ref 98–111)
Creatinine, Ser: 1.61 mg/dL — ABNORMAL HIGH (ref 0.61–1.24)
GFR calc Af Amer: 53 mL/min — ABNORMAL LOW (ref >60)
GFR calc non Af Amer: 45 mL/min — ABNORMAL LOW (ref >60)
Glucose, Bld: 154 mg/dL — ABNORMAL HIGH (ref 70–99)
Potassium: 4 mmol/L (ref 3.5–5.1)
Sodium: 136 mmol/L (ref 135–145)
Total Bilirubin: 0.5 mg/dL (ref 0.3–1.2)
Total Protein: 7.7 g/dL (ref 6.5–8.1)

## 2019-08-12 LAB — URINALYSIS, ROUTINE W REFLEX MICROSCOPIC
Bilirubin Urine: NEGATIVE
Glucose, UA: NEGATIVE mg/dL
Hgb urine dipstick: NEGATIVE
Ketones, ur: NEGATIVE mg/dL
Leukocytes,Ua: NEGATIVE
Nitrite: NEGATIVE
Protein, ur: NEGATIVE mg/dL
Specific Gravity, Urine: 1.015 (ref 1.005–1.030)
pH: 6 (ref 5.0–8.0)

## 2019-08-12 LAB — PROTIME-INR
INR: 1 (ref 0.8–1.2)
Prothrombin Time: 13.3 seconds (ref 11.4–15.2)

## 2019-08-12 LAB — CBC
HCT: 37.6 % — ABNORMAL LOW (ref 39.0–52.0)
Hemoglobin: 12.1 g/dL — ABNORMAL LOW (ref 13.0–17.0)
MCH: 30.1 pg (ref 26.0–34.0)
MCHC: 32.2 g/dL (ref 30.0–36.0)
MCV: 93.5 fL (ref 80.0–100.0)
Platelets: 481 10*3/uL — ABNORMAL HIGH (ref 150–400)
RBC: 4.02 MIL/uL — ABNORMAL LOW (ref 4.22–5.81)
RDW: 13.5 % (ref 11.5–15.5)
WBC: 9.7 10*3/uL (ref 4.0–10.5)
nRBC: 0 % (ref 0.0–0.2)

## 2019-08-12 LAB — GLUCOSE, CAPILLARY: Glucose-Capillary: 148 mg/dL — ABNORMAL HIGH (ref 70–99)

## 2019-08-13 LAB — NOVEL CORONAVIRUS, NAA (HOSP ORDER, SEND-OUT TO REF LAB; TAT 18-24 HRS): SARS-CoV-2, NAA: NOT DETECTED

## 2019-08-13 LAB — SURGICAL PCR SCREEN
MRSA, PCR: NEGATIVE
Staphylococcus aureus: POSITIVE — AB

## 2019-08-13 NOTE — Progress Notes (Signed)
PCR results routed to Dr. Lyla Glassing for review

## 2019-08-15 ENCOUNTER — Inpatient Hospital Stay (HOSPITAL_COMMUNITY): Payer: 59 | Admitting: Certified Registered Nurse Anesthetist

## 2019-08-15 ENCOUNTER — Encounter (HOSPITAL_COMMUNITY): Payer: Self-pay | Admitting: *Deleted

## 2019-08-15 ENCOUNTER — Encounter (HOSPITAL_COMMUNITY)
Admission: RE | Disposition: A | Discharge status: Home / Self Care | Payer: Self-pay | Source: Home / Self Care | Attending: Orthopedic Surgery

## 2019-08-15 ENCOUNTER — Inpatient Hospital Stay (HOSPITAL_COMMUNITY): Payer: 59 | Admitting: Emergency Medicine

## 2019-08-15 ENCOUNTER — Inpatient Hospital Stay (HOSPITAL_COMMUNITY): Payer: 59

## 2019-08-15 ENCOUNTER — Other Ambulatory Visit: Payer: Self-pay

## 2019-08-15 ENCOUNTER — Inpatient Hospital Stay (HOSPITAL_COMMUNITY)
Admission: RE | Admit: 2019-08-15 | Discharge: 2019-08-16 | DRG: 470 | Disposition: A | Discharge status: Home / Self Care | Payer: 59 | Attending: Orthopedic Surgery | Admitting: Orthopedic Surgery

## 2019-08-15 DIAGNOSIS — Z20828 Contact with and (suspected) exposure to other viral communicable diseases: Secondary | ICD-10-CM | POA: Diagnosis present

## 2019-08-15 DIAGNOSIS — M1711 Unilateral primary osteoarthritis, right knee: Secondary | ICD-10-CM | POA: Diagnosis present

## 2019-08-15 DIAGNOSIS — Z7984 Long term (current) use of oral hypoglycemic drugs: Secondary | ICD-10-CM

## 2019-08-15 DIAGNOSIS — M109 Gout, unspecified: Secondary | ICD-10-CM | POA: Diagnosis present

## 2019-08-15 DIAGNOSIS — Z79899 Other long term (current) drug therapy: Secondary | ICD-10-CM

## 2019-08-15 DIAGNOSIS — Z7982 Long term (current) use of aspirin: Secondary | ICD-10-CM | POA: Diagnosis not present

## 2019-08-15 DIAGNOSIS — Z96652 Presence of left artificial knee joint: Secondary | ICD-10-CM | POA: Diagnosis present

## 2019-08-15 DIAGNOSIS — E119 Type 2 diabetes mellitus without complications: Secondary | ICD-10-CM | POA: Diagnosis present

## 2019-08-15 DIAGNOSIS — I10 Essential (primary) hypertension: Secondary | ICD-10-CM | POA: Diagnosis present

## 2019-08-15 DIAGNOSIS — G8929 Other chronic pain: Secondary | ICD-10-CM | POA: Diagnosis present

## 2019-08-15 DIAGNOSIS — Z96651 Presence of right artificial knee joint: Secondary | ICD-10-CM

## 2019-08-15 HISTORY — PX: KNEE ARTHROPLASTY: SHX992

## 2019-08-15 LAB — TYPE AND SCREEN
ABO/RH(D): O POS
Antibody Screen: NEGATIVE

## 2019-08-15 LAB — GLUCOSE, CAPILLARY
Glucose-Capillary: 130 mg/dL — ABNORMAL HIGH (ref 70–99)
Glucose-Capillary: 146 mg/dL — ABNORMAL HIGH (ref 70–99)
Glucose-Capillary: 246 mg/dL — ABNORMAL HIGH (ref 70–99)
Glucose-Capillary: 160 mg/dL — ABNORMAL HIGH (ref 70–99)

## 2019-08-15 SURGERY — ARTHROPLASTY, KNEE, TOTAL, USING IMAGELESS COMPUTER-ASSISTED NAVIGATION
Anesthesia: Spinal | Site: Knee | Laterality: Right

## 2019-08-15 MED ORDER — SODIUM CHLORIDE 0.9 % IR SOLN
Status: DC | PRN
  Administered 2019-08-15: 1000 mL

## 2019-08-15 MED ORDER — LISINOPRIL-HYDROCHLOROTHIAZIDE 20-25 MG PO TABS: 1.0000 | ORAL_TABLET | Freq: Two times a day (BID) | ORAL | Status: DC

## 2019-08-15 MED ORDER — BUPIVACAINE-EPINEPHRINE 0.25% -1:200000 IJ SOLN
INTRAMUSCULAR | Status: DC | PRN
  Administered 2019-08-15: 30 mL

## 2019-08-15 MED ORDER — MEPERIDINE HCL 50 MG/ML IJ SOLN: 6.2500 mg | INTRAMUSCULAR | Status: DC | PRN

## 2019-08-15 MED ORDER — POLYETHYLENE GLYCOL 3350 17 G PO PACK: 17.0000 g | PACK | Freq: Every day | ORAL | Status: DC | PRN

## 2019-08-15 MED ORDER — ONDANSETRON HCL 4 MG/2ML IJ SOLN: 4.0000 mg | Freq: Once | INTRAMUSCULAR | Status: DC | PRN

## 2019-08-15 MED ORDER — ATENOLOL 50 MG PO TABS
50.0000 mg | ORAL_TABLET | Freq: Two times a day (BID) | ORAL | Status: DC
  Administered 2019-08-15 – 2019-08-16 (×2): 50 mg via ORAL
  Filled 2019-08-15 (×2): qty 1

## 2019-08-15 MED ORDER — DEXAMETHASONE SODIUM PHOSPHATE 10 MG/ML IJ SOLN
10.0000 mg | Freq: Once | INTRAMUSCULAR | Status: AC
  Administered 2019-08-16: 10 mg via INTRAVENOUS
  Filled 2019-08-15: qty 1

## 2019-08-15 MED ORDER — KETOROLAC TROMETHAMINE 30 MG/ML IJ SOLN
INTRAMUSCULAR | Status: DC | PRN
  Administered 2019-08-15: 30 mg via INTRA_ARTICULAR

## 2019-08-15 MED ORDER — STERILE WATER FOR IRRIGATION IR SOLN
Status: DC | PRN
  Administered 2019-08-15: 2000 mL

## 2019-08-15 MED ORDER — FENTANYL CITRATE (PF) 100 MCG/2ML IJ SOLN
INTRAMUSCULAR | Status: AC
  Filled 2019-08-15: qty 2

## 2019-08-15 MED ORDER — SODIUM CHLORIDE (PF) 0.9 % IJ SOLN
INTRAMUSCULAR | Status: DC | PRN
  Administered 2019-08-15: 30 mL

## 2019-08-15 MED ORDER — POVIDONE-IODINE 10 % EX SWAB: 2.0000 "application " | Freq: Once | CUTANEOUS | Status: DC

## 2019-08-15 MED ORDER — DOCUSATE SODIUM 100 MG PO CAPS
100.0000 mg | ORAL_CAPSULE | Freq: Two times a day (BID) | ORAL | Status: DC
  Administered 2019-08-15 – 2019-08-16 (×2): 100 mg via ORAL
  Filled 2019-08-15 (×2): qty 1

## 2019-08-15 MED ORDER — HYDROCHLOROTHIAZIDE 25 MG PO TABS
25.0000 mg | ORAL_TABLET | Freq: Two times a day (BID) | ORAL | Status: DC
  Administered 2019-08-16: 25 mg via ORAL
  Filled 2019-08-15: qty 1

## 2019-08-15 MED ORDER — ONDANSETRON HCL 4 MG PO TABS: 4.0000 mg | ORAL_TABLET | Freq: Four times a day (QID) | ORAL | Status: DC | PRN

## 2019-08-15 MED ORDER — SODIUM CHLORIDE 0.9 % IR SOLN
Status: DC | PRN
  Administered 2019-08-15: 3000 mL
  Administered 2019-08-15: 1000 mL

## 2019-08-15 MED ORDER — PROPOFOL 500 MG/50ML IV EMUL
INTRAVENOUS | Status: DC | PRN
  Administered 2019-08-15: 50 ug/kg/min via INTRAVENOUS

## 2019-08-15 MED ORDER — METOCLOPRAMIDE HCL 5 MG/ML IJ SOLN: 5.0000 mg | Freq: Three times a day (TID) | INTRAMUSCULAR | Status: DC | PRN

## 2019-08-15 MED ORDER — INSULIN ASPART 100 UNIT/ML ~~LOC~~ SOLN: 0.0000 [IU] | Freq: Every day | SUBCUTANEOUS | Status: DC

## 2019-08-15 MED ORDER — METOCLOPRAMIDE HCL 5 MG PO TABS: 5.0000 mg | ORAL_TABLET | Freq: Three times a day (TID) | ORAL | Status: DC | PRN

## 2019-08-15 MED ORDER — PROPOFOL 10 MG/ML IV BOLUS
INTRAVENOUS | Status: DC | PRN
  Administered 2019-08-15: 10 mg via INTRAVENOUS
  Administered 2019-08-15: 20 mg via INTRAVENOUS
  Administered 2019-08-15 (×2): 10 mg via INTRAVENOUS
  Administered 2019-08-15: 20 mg via INTRAVENOUS

## 2019-08-15 MED ORDER — AMLODIPINE BESYLATE 10 MG PO TABS
10.0000 mg | ORAL_TABLET | Freq: Every day | ORAL | Status: DC
  Administered 2019-08-16: 10 mg via ORAL
  Filled 2019-08-15: qty 1

## 2019-08-15 MED ORDER — ONDANSETRON HCL 4 MG/2ML IJ SOLN
INTRAMUSCULAR | Status: AC
  Filled 2019-08-15: qty 2

## 2019-08-15 MED ORDER — METFORMIN HCL 500 MG PO TABS
1000.0000 mg | ORAL_TABLET | Freq: Two times a day (BID) | ORAL | Status: DC
  Administered 2019-08-16: 1000 mg via ORAL
  Filled 2019-08-15: qty 2

## 2019-08-15 MED ORDER — ROPIVACAINE HCL 7.5 MG/ML IJ SOLN
INTRAMUSCULAR | Status: DC | PRN
  Administered 2019-08-15: 20 mL via PERINEURAL

## 2019-08-15 MED ORDER — INSULIN ASPART 100 UNIT/ML ~~LOC~~ SOLN
0.0000 [IU] | Freq: Three times a day (TID) | SUBCUTANEOUS | Status: DC
  Administered 2019-08-15: 3 [IU] via SUBCUTANEOUS

## 2019-08-15 MED ORDER — FENTANYL CITRATE (PF) 100 MCG/2ML IJ SOLN
INTRAMUSCULAR | Status: DC | PRN
  Administered 2019-08-15: 100 ug via INTRAVENOUS

## 2019-08-15 MED ORDER — BUPIVACAINE-EPINEPHRINE 0.25% -1:200000 IJ SOLN
INTRAMUSCULAR | Status: AC
  Filled 2019-08-15: qty 1

## 2019-08-15 MED ORDER — BUPIVACAINE IN DEXTROSE 0.75-8.25 % IT SOLN
INTRATHECAL | Status: DC | PRN
  Administered 2019-08-15: 2 mg via INTRATHECAL

## 2019-08-15 MED ORDER — MENTHOL 3 MG MT LOZG: 1.0000 | LOZENGE | OROMUCOSAL | Status: DC | PRN

## 2019-08-15 MED ORDER — CHLORHEXIDINE GLUCONATE 4 % EX LIQD: 60.0000 mL | Freq: Once | CUTANEOUS | Status: DC

## 2019-08-15 MED ORDER — LISINOPRIL 20 MG PO TABS
20.0000 mg | ORAL_TABLET | Freq: Two times a day (BID) | ORAL | Status: DC
  Administered 2019-08-16: 20 mg via ORAL
  Filled 2019-08-15: qty 1

## 2019-08-15 MED ORDER — ONDANSETRON HCL 4 MG/2ML IJ SOLN: 4.0000 mg | Freq: Four times a day (QID) | INTRAMUSCULAR | Status: DC | PRN

## 2019-08-15 MED ORDER — METHOCARBAMOL 500 MG PO TABS
500.0000 mg | ORAL_TABLET | Freq: Four times a day (QID) | ORAL | Status: DC | PRN
  Administered 2019-08-15 – 2019-08-16 (×2): 500 mg via ORAL
  Filled 2019-08-15 (×2): qty 1

## 2019-08-15 MED ORDER — LACTATED RINGERS IV SOLN
INTRAVENOUS | Status: DC
  Administered 2019-08-15 (×2): via INTRAVENOUS

## 2019-08-15 MED ORDER — PROPOFOL 500 MG/50ML IV EMUL
INTRAVENOUS | Status: AC
  Filled 2019-08-15: qty 50

## 2019-08-15 MED ORDER — ALLOPURINOL 300 MG PO TABS
300.0000 mg | ORAL_TABLET | Freq: Every day | ORAL | Status: DC
  Administered 2019-08-16: 300 mg via ORAL
  Filled 2019-08-15: qty 1

## 2019-08-15 MED ORDER — POVIDONE-IODINE 10 % EX SWAB
2.0000 "application " | Freq: Once | CUTANEOUS | Status: AC
  Administered 2019-08-15: 2 via TOPICAL

## 2019-08-15 MED ORDER — KETOROLAC TROMETHAMINE 30 MG/ML IJ SOLN
INTRAMUSCULAR | Status: AC
  Filled 2019-08-15: qty 1

## 2019-08-15 MED ORDER — ACETAMINOPHEN 10 MG/ML IV SOLN
1000.0000 mg | INTRAVENOUS | Status: AC
  Administered 2019-08-15: 1000 mg via INTRAVENOUS
  Filled 2019-08-15: qty 100

## 2019-08-15 MED ORDER — PROPOFOL 10 MG/ML IV BOLUS
INTRAVENOUS | Status: AC
  Filled 2019-08-15: qty 20

## 2019-08-15 MED ORDER — PRAVASTATIN SODIUM 20 MG PO TABS
40.0000 mg | ORAL_TABLET | Freq: Every day | ORAL | Status: DC
  Administered 2019-08-15: 40 mg via ORAL
  Filled 2019-08-15: qty 2

## 2019-08-15 MED ORDER — PHENOL 1.4 % MT LIQD: 1.0000 | OROMUCOSAL | Status: DC | PRN

## 2019-08-15 MED ORDER — ACETAMINOPHEN 325 MG PO TABS: 325.0000 mg | ORAL_TABLET | Freq: Four times a day (QID) | ORAL | Status: DC | PRN

## 2019-08-15 MED ORDER — HYDROCODONE-ACETAMINOPHEN 5-325 MG PO TABS
1.0000 | ORAL_TABLET | ORAL | Status: DC | PRN
  Administered 2019-08-15 – 2019-08-16 (×4): 2 via ORAL
  Filled 2019-08-15 (×4): qty 2

## 2019-08-15 MED ORDER — SENNA 8.6 MG PO TABS
1.0000 | ORAL_TABLET | Freq: Two times a day (BID) | ORAL | Status: DC
  Administered 2019-08-15 – 2019-08-16 (×2): 8.6 mg via ORAL
  Filled 2019-08-15 (×2): qty 1

## 2019-08-15 MED ORDER — DIPHENHYDRAMINE HCL 12.5 MG/5ML PO ELIX: 12.5000 mg | ORAL_SOLUTION | ORAL | Status: DC | PRN

## 2019-08-15 MED ORDER — SODIUM CHLORIDE 0.9 % IV SOLN
INTRAVENOUS | Status: DC
  Administered 2019-08-15 – 2019-08-16 (×3): via INTRAVENOUS

## 2019-08-15 MED ORDER — PHENYLEPHRINE HCL-NACL 10-0.9 MG/250ML-% IV SOLN
INTRAVENOUS | Status: DC | PRN
  Administered 2019-08-15: 40 ug/min via INTRAVENOUS

## 2019-08-15 MED ORDER — ALUM & MAG HYDROXIDE-SIMETH 200-200-20 MG/5ML PO SUSP: 30.0000 mL | ORAL | Status: DC | PRN

## 2019-08-15 MED ORDER — CEFAZOLIN SODIUM-DEXTROSE 2-4 GM/100ML-% IV SOLN
2.0000 g | INTRAVENOUS | Status: AC
  Administered 2019-08-15: 2 g via INTRAVENOUS
  Filled 2019-08-15: qty 100

## 2019-08-15 MED ORDER — ISOPROPYL ALCOHOL 70 % SOLN
Status: DC | PRN
  Administered 2019-08-15: 1 via TOPICAL

## 2019-08-15 MED ORDER — SODIUM CHLORIDE (PF) 0.9 % IJ SOLN
INTRAMUSCULAR | Status: AC
  Filled 2019-08-15: qty 50

## 2019-08-15 MED ORDER — HYDROCODONE-ACETAMINOPHEN 7.5-325 MG PO TABS: 1.0000 | ORAL_TABLET | ORAL | Status: DC | PRN

## 2019-08-15 MED ORDER — NIACIN ER (ANTIHYPERLIPIDEMIC) 500 MG PO TBCR
1000.0000 mg | EXTENDED_RELEASE_TABLET | Freq: Every day | ORAL | Status: DC
  Administered 2019-08-15: 1000 mg via ORAL
  Filled 2019-08-15: qty 2

## 2019-08-15 MED ORDER — MIDAZOLAM HCL 2 MG/2ML IJ SOLN
INTRAMUSCULAR | Status: AC
  Filled 2019-08-15: qty 2

## 2019-08-15 MED ORDER — DEXAMETHASONE SODIUM PHOSPHATE 10 MG/ML IJ SOLN
INTRAMUSCULAR | Status: DC | PRN
  Administered 2019-08-15: 4 mg via INTRAVENOUS

## 2019-08-15 MED ORDER — MIDAZOLAM HCL 5 MG/5ML IJ SOLN
INTRAMUSCULAR | Status: DC | PRN
  Administered 2019-08-15: 2 mg via INTRAVENOUS

## 2019-08-15 MED ORDER — ASPIRIN 81 MG PO CHEW
81.0000 mg | CHEWABLE_TABLET | Freq: Two times a day (BID) | ORAL | Status: DC
  Administered 2019-08-15 – 2019-08-16 (×2): 81 mg via ORAL
  Filled 2019-08-15: qty 1

## 2019-08-15 MED ORDER — HYDROMORPHONE HCL 1 MG/ML IJ SOLN: 0.2500 mg | INTRAMUSCULAR | Status: DC | PRN

## 2019-08-15 MED ORDER — TRANEXAMIC ACID-NACL 1000-0.7 MG/100ML-% IV SOLN
1000.0000 mg | INTRAVENOUS | Status: AC
  Administered 2019-08-15: 1000 mg via INTRAVENOUS
  Filled 2019-08-15: qty 100

## 2019-08-15 MED ORDER — METHOCARBAMOL 500 MG IVPB - SIMPLE MED
500.0000 mg | Freq: Four times a day (QID) | INTRAVENOUS | Status: DC | PRN
  Filled 2019-08-15: qty 50

## 2019-08-15 MED ORDER — CEFAZOLIN SODIUM-DEXTROSE 2-4 GM/100ML-% IV SOLN
2.0000 g | Freq: Four times a day (QID) | INTRAVENOUS | Status: AC
  Administered 2019-08-15 (×2): 2 g via INTRAVENOUS
  Filled 2019-08-15 (×2): qty 100

## 2019-08-15 MED ORDER — CELECOXIB 200 MG PO CAPS
200.0000 mg | ORAL_CAPSULE | Freq: Two times a day (BID) | ORAL | Status: DC
  Administered 2019-08-15 – 2019-08-16 (×3): 200 mg via ORAL
  Filled 2019-08-15 (×3): qty 1

## 2019-08-15 MED ORDER — SODIUM CHLORIDE 0.9 % IV SOLN: INTRAVENOUS | Status: DC

## 2019-08-15 MED ORDER — ONDANSETRON HCL 4 MG/2ML IJ SOLN
INTRAMUSCULAR | Status: DC | PRN
  Administered 2019-08-15: 4 mg via INTRAVENOUS

## 2019-08-15 MED ORDER — MORPHINE SULFATE (PF) 2 MG/ML IV SOLN: 0.5000 mg | INTRAVENOUS | Status: DC | PRN

## 2019-08-15 SURGICAL SUPPLY — 77 items
ADH SKN CLS APL DERMABOND .7 (GAUZE/BANDAGES/DRESSINGS) ×1
APL PRP STRL LF DISP 70% ISPRP (MISCELLANEOUS) ×2
BAG SPEC THK2 15X12 ZIP CLS (MISCELLANEOUS)
BAG ZIPLOCK 12X15 (MISCELLANEOUS) IMPLANT
BASEPLATE TIBIA REVIS 6 R/L (Joint) IMPLANT
BATTERY INSTRU NAVIGATION (MISCELLANEOUS) ×9 IMPLANT
BLADE SAW RECIPROCATING 77.5 (BLADE) ×3 IMPLANT
BNDG ELASTIC 4X5.8 VLCR STR LF (GAUZE/BANDAGES/DRESSINGS) ×3 IMPLANT
BNDG ELASTIC 6X5.8 VLCR STR LF (GAUZE/BANDAGES/DRESSINGS) ×3 IMPLANT
BONE CEMENT SIMPLEX TOBRAMYCIN (Cement) ×6 IMPLANT
BSPLAT TIB 6 UNV CMNT TL STAB (Joint) ×1 IMPLANT
BTRY SRG DRVR LF (MISCELLANEOUS) ×3
CEMENT BONE SIMPLEX TOBRAMYCIN (Cement) IMPLANT
CHLORAPREP W/TINT 26 (MISCELLANEOUS) ×6 IMPLANT
COMPONENT TRI CR FEM SZ5 KNEE (Orthopedic Implant) IMPLANT
COVER SURGICAL LIGHT HANDLE (MISCELLANEOUS) ×3 IMPLANT
COVER WAND RF STERILE (DRAPES) IMPLANT
CUFF TOURN SGL QUICK 34 (TOURNIQUET CUFF) ×3
CUFF TRNQT CYL 34X4.125X (TOURNIQUET CUFF) ×1 IMPLANT
DECANTER SPIKE VIAL GLASS SM (MISCELLANEOUS) ×6 IMPLANT
DERMABOND ADVANCED (GAUZE/BANDAGES/DRESSINGS) ×2
DERMABOND ADVANCED .7 DNX12 (GAUZE/BANDAGES/DRESSINGS) ×2 IMPLANT
DRAPE SHEET LG 3/4 BI-LAMINATE (DRAPES) ×9 IMPLANT
DRAPE U-SHAPE 47X51 STRL (DRAPES) ×3 IMPLANT
DRSG AQUACEL AG ADV 3.5X10 (GAUZE/BANDAGES/DRESSINGS) ×3 IMPLANT
DRSG TEGADERM 4X4.75 (GAUZE/BANDAGES/DRESSINGS) IMPLANT
ELECT BLADE TIP CTD 4 INCH (ELECTRODE) ×3 IMPLANT
ELECT REM PT RETURN 15FT ADLT (MISCELLANEOUS) ×3 IMPLANT
EVACUATOR 1/8 PVC DRAIN (DRAIN) IMPLANT
GAUZE SPONGE 4X4 12PLY STRL (GAUZE/BANDAGES/DRESSINGS) ×3 IMPLANT
GLOVE BIO SURGEON STRL SZ8.5 (GLOVE) ×6 IMPLANT
GLOVE BIOGEL PI IND STRL 8.5 (GLOVE) ×1 IMPLANT
GLOVE BIOGEL PI INDICATOR 8.5 (GLOVE) ×2
GOWN SPEC L3 XXLG W/TWL (GOWN DISPOSABLE) ×3 IMPLANT
HANDPIECE INTERPULSE COAX TIP (DISPOSABLE) ×3
HOLDER FOLEY CATH W/STRAP (MISCELLANEOUS) ×3 IMPLANT
HOOD PEEL AWAY FLYTE STAYCOOL (MISCELLANEOUS) ×9 IMPLANT
INSERT KNEE TIB BRG SZ 6 (Insert) ×2 IMPLANT
JET LAVAGE IRRISEPT WOUND (IRRIGATION / IRRIGATOR) ×3
KIT TURNOVER KIT A (KITS) IMPLANT
KNEE PATELLA ASYMMETRIC 10X35 (Knees) ×2 IMPLANT
LAVAGE JET IRRISEPT WOUND (IRRIGATION / IRRIGATOR) ×1 IMPLANT
MARKER SKIN DUAL TIP RULER LAB (MISCELLANEOUS) ×3 IMPLANT
NDL SAFETY ECLIPSE 18X1.5 (NEEDLE) ×1 IMPLANT
NDL SPNL 18GX3.5 QUINCKE PK (NEEDLE) ×1 IMPLANT
NEEDLE HYPO 18GX1.5 SHARP (NEEDLE) ×3
NEEDLE SPNL 18GX3.5 QUINCKE PK (NEEDLE) ×3 IMPLANT
NS IRRIG 1000ML POUR BTL (IV SOLUTION) ×3 IMPLANT
PACK TOTAL KNEE CUSTOM (KITS) ×3 IMPLANT
PADDING CAST COTTON 6X4 STRL (CAST SUPPLIES) ×3 IMPLANT
PIN FLUTED HEDLESS FIX 3.5X1/8 (PIN) ×4 IMPLANT
PROTECTOR NERVE ULNAR (MISCELLANEOUS) ×3 IMPLANT
SAW OSC TIP CART 19.5X105X1.3 (SAW) ×3 IMPLANT
SEALER BIPOLAR AQUA 6.0 (INSTRUMENTS) ×3 IMPLANT
SET HNDPC FAN SPRY TIP SCT (DISPOSABLE) ×1 IMPLANT
SET PAD KNEE POSITIONER (MISCELLANEOUS) ×3 IMPLANT
SPONGE DRAIN TRACH 4X4 STRL 2S (GAUZE/BANDAGES/DRESSINGS) IMPLANT
SPONGE LAP 18X18 RF (DISPOSABLE) IMPLANT
SUT ETHILON 3 0 PS 1 (SUTURE) ×6 IMPLANT
SUT MNCRL AB 3-0 PS2 18 (SUTURE) ×3 IMPLANT
SUT MNCRL AB 4-0 PS2 18 (SUTURE) ×3 IMPLANT
SUT MON AB 2-0 CT1 36 (SUTURE) ×6 IMPLANT
SUT STRATAFIX PDO 1 14 VIOLET (SUTURE) ×3
SUT STRATFX PDO 1 14 VIOLET (SUTURE) ×1
SUT VIC AB 1 CTX 36 (SUTURE) ×6
SUT VIC AB 1 CTX36XBRD ANBCTR (SUTURE) ×2 IMPLANT
SUT VIC AB 2-0 CT1 27 (SUTURE) ×3
SUT VIC AB 2-0 CT1 TAPERPNT 27 (SUTURE) ×1 IMPLANT
SUTURE STRATFX PDO 1 14 VIOLET (SUTURE) ×1 IMPLANT
SYR 3ML LL SCALE MARK (SYRINGE) ×3 IMPLANT
TIB REV BASEPLATE 6 R/L (Joint) ×3 IMPLANT
TOWER CARTRIDGE SMART MIX (DISPOSABLE) ×2 IMPLANT
TRAY FOLEY MTR SLVR 16FR STAT (SET/KITS/TRAYS/PACK) IMPLANT
TRI CRUC RET FEM SZ5 KNEE (Orthopedic Implant) ×3 IMPLANT
WATER STERILE IRR 1000ML POUR (IV SOLUTION) ×6 IMPLANT
WRAP KNEE MAXI GEL POST OP (GAUZE/BANDAGES/DRESSINGS) ×3 IMPLANT
YANKAUER SUCT BULB TIP 10FT TU (MISCELLANEOUS) ×3 IMPLANT

## 2019-08-15 NOTE — Anesthesia Procedure Notes (Signed)
Spinal  Patient location during procedure: OR Start time: 08/15/2019 7:52 AM End time: 08/15/2019 7:56 AM Staffing Anesthesiologist: Lillia Abed, MD Resident/CRNA: Montel Clock, CRNA Performed: resident/CRNA  Preanesthetic Checklist Completed: patient identified, surgical consent, pre-op evaluation, timeout performed, IV checked, risks and benefits discussed and monitors and equipment checked Spinal Block Patient position: sitting Prep: DuraPrep Patient monitoring: heart rate, cardiac monitor, continuous pulse ox and blood pressure Approach: midline Location: L3-4 Injection technique: single-shot Needle Needle type: Pencan  Needle gauge: 24 G Needle length: 10 cm Needle insertion depth: 7 cm Assessment Sensory level: T6

## 2019-08-15 NOTE — Discharge Instructions (Signed)
° °Dr. Favor Kreh °Total Joint Specialist °[facility] °[address] °[place], [place] [address] °[phone] ° °TOTAL KNEE REPLACEMENT POSTOPERATIVE DIRECTIONS ° ° ° °Knee Rehabilitation, Guidelines Following Surgery  °Results after knee surgery are often greatly improved when you follow the exercise, range of motion and muscle strengthening exercises prescribed by your doctor. Safety measures are also important to protect the knee from further injury. Any time any of these exercises cause you to have increased pain or swelling in your knee joint, decrease the amount until you are comfortable again and slowly increase them. If you have problems or questions, call your caregiver or physical therapist for advice.  ° °WEIGHT BEARING °Weight bearing as tolerated with assist device (walker, cane, etc) as directed, use it as long as suggested by your surgeon or therapist, typically at least 4-6 weeks. ° °HOME CARE INSTRUCTIONS  °Remove items at home which could result in a fall. This includes throw rugs or furniture in walking pathways.  °Continue medications as instructed at time of discharge. °You may have some home medications which will be placed on hold until you complete the course of blood thinner medication.  °You may start showering once you are discharged home but do not submerge the incision under water. Just pat the incision dry and apply a dry gauze dressing on daily. °Walk with walker as instructed.  °You may resume a sexual relationship in one month or when given the OK by your doctor.  °· Use walker as long as suggested by your caregivers. °· Avoid periods of inactivity such as sitting longer than an hour when not asleep. This helps prevent blood clots.  °You may put full weight on your legs and walk as much as is comfortable.  °You may return to work once you are cleared by your doctor.  °Do not drive a car for 6 weeks or until released by you surgeon.  °· Do not drive  while taking narcotics.  °Wear the elastic stockings for three weeks following surgery during the day but you may remove then at night. °Make sure you keep all of your appointments after your operation with all of your doctors and caregivers. You should call the office at the above phone number and make an appointment for approximately two weeks after the date of your surgery. °Do not remove your surgical dressing. The dressing is waterproof; you may take showers in 3 days, but do not take tub baths or submerge the dressing. °Please pick up a stool softener and laxative for home use as long as you are requiring pain medications. °· ICE to the affected knee every three hours for 30 minutes at a time and then as needed for pain and swelling.  Continue to use ice on the knee for pain and swelling from surgery. You may notice swelling that will progress down to the foot and ankle.  This is normal after surgery.  Elevate the leg when you are not up walking on it.   °It is important for you to complete the blood thinner medication as prescribed by your doctor. °· Continue to use the breathing machine which will help keep your temperature down.  It is common for your temperature to cycle up and down following surgery, especially at night when you are not up moving around and exerting yourself.  The breathing machine keeps your lungs expanded and your temperature down. ° °RANGE OF MOTION AND STRENGTHENING EXERCISES  °Rehabilitation of the knee is important following   a knee injury or an operation. After just a few days of immobilization, the muscles of the thigh which control the knee become weakened and shrink (atrophy). Knee exercises are designed to build up the tone and strength of the thigh muscles and to improve knee motion. Often times heat used for twenty to thirty minutes before working out will loosen up your tissues and help with improving the range of motion but do not use heat for the first two weeks following  surgery. These exercises can be done on a training (exercise) mat, on the floor, on a table or on a bed. Use what ever works the best and is most comfortable for you Knee exercises include:  °Leg Lifts - While your knee is still immobilized in a splint or cast, you can do straight leg raises. Lift the leg to 60 degrees, hold for 3 sec, and slowly lower the leg. Repeat 10-20 times 2-3 times daily. Perform this exercise against resistance later as your knee gets better.  °Quad and Hamstring Sets - Tighten up the muscle on the front of the thigh (Quad) and hold for 5-10 sec. Repeat this 10-20 times hourly. Hamstring sets are done by pushing the foot backward against an object and holding for 5-10 sec. Repeat as with quad sets.  °A rehabilitation program following serious knee injuries can speed recovery and prevent re-injury in the future due to weakened muscles. Contact your doctor or a physical therapist for more information on knee rehabilitation.  ° °SKILLED REHAB INSTRUCTIONS: °If the patient is transferred to a skilled rehab facility following release from the hospital, a list of the current medications will be sent to the facility for the patient to continue.  When discharged from the skilled rehab facility, please have the facility set up the patient's Home Health Physical Therapy prior to being released. Also, the skilled facility will be responsible for providing the patient with their medications at time of release from the facility to include their pain medication, the muscle relaxants, and their blood thinner medication. If the patient is still at the rehab facility at time of the two week follow up appointment, the skilled rehab facility will also need to assist the patient in arranging follow up appointment in our office and any transportation needs. ° °MAKE SURE YOU:  °Understand these instructions.  °Will watch your condition.  °Will get help right away if you are not doing well or get worse.   ° ° °Pick up stool softner and laxative for home use following surgery while on pain medications. °Do NOT remove your dressing. You may shower.  °Do not take tub baths or submerge incision under water. °May shower starting three days after surgery. °Please use a clean towel to pat the incision dry following showers. °Continue to use ice for pain and swelling after surgery. °Do not use any lotions or creams on the incision until instructed by your surgeon. ° °

## 2019-08-15 NOTE — Transfer of Care (Signed)
Immediate Anesthesia Transfer of Care Note  Patient: Bryan Atkinson  Procedure(s) Performed: COMPUTER ASSISTED TOTAL KNEE ARTHROPLASTY (Right Knee)  Patient Location: PACU  Anesthesia Type:Spinal  Level of Consciousness: awake and patient cooperative  Airway & Oxygen Therapy: Patient Spontanous Breathing and Patient connected to face mask oxygen  Post-op Assessment: Report given to RN and Post -op Vital signs reviewed and stable  Post vital signs: Reviewed and stable  Last Vitals:  Vitals Value Taken Time  BP 121/77 08/15/19 1119  Temp    Pulse 80 08/15/19 1121  Resp 22 08/15/19 1121  SpO2 96 % 08/15/19 1121  Vitals shown include unvalidated device data.  Last Pain:  Vitals:   08/15/19 0612  TempSrc: Oral  PainSc:       Patients Stated Pain Goal: 4 ([id number])  Complications: No apparent anesthesia complications

## 2019-08-15 NOTE — Evaluation (Signed)
Physical Therapy Evaluation Patient Details Name: Bryan Atkinson MRN: [id number] DOB: 1956-05-02 Today's Date: 08/15/2019   History of Present Illness  Patient is 63 y.o. male s/p Rt TKA on 08/15/19 with PMH significant for OA, DM, HTN, and past Lt TKA on 06/27/19.  Clinical Impression  Bryan Atkinson is a 63 y.o. male POD 0 s/p Rt TKA. Patient reports independence with mobility at baseline. Patient is now limited by functional impairments (see PT problem list below) and requires min assist/guard for transfers and gait with RW. Patient was able to ambulate ~80 feet with RW and no overt LOB noted. Patient instructed in exercise to facilitate ROM and circulation. Patient will benefit from continued skilled PT interventions to address impairments and progress towards PLOF. Acute PT will follow to progress mobility and stair training in preparation for safe discharge home.     Follow Up Recommendations Follow surgeon's recommendation for DC plan and follow-up therapies    Equipment Recommendations  None recommended by PT    Recommendations for Other Services       Precautions / Restrictions Precautions Precautions: Fall Restrictions Weight Bearing Restrictions: No      Mobility  Bed Mobility Overal bed mobility: Needs Assistance Bed Mobility: Supine to Sit     Supine to sit: Supervision;HOB elevated     General bed mobility comments: no cues needed, pt using rails  Transfers Overall transfer level: Needs assistance Equipment used: Rolling walker (2 wheeled) Transfers: Sit to/from Stand Sit to Stand: Min guard         General transfer comment: cues for safe hand placement and technique with RW, guard for safety when rising  Ambulation/Gait Ambulation/Gait assistance: Min guard Gait Distance (Feet): 80 Feet Assistive device: Rolling walker (2 wheeled) Gait Pattern/deviations: Antalgic;Decreased step length - right;Decreased step length - left;Decreased  stance time - right;Decreased weight shift to right;Step-through pattern Gait velocity: decreased   General Gait Details: pt with good awareness for safe hand placement on RW, no overt LOB noted  Stairs            Wheelchair Mobility    Modified Rankin (Stroke Patients Only)       Balance Overall balance assessment: Mild deficits observed, not formally tested                Pertinent Vitals/Pain Pain Assessment: 0-10 Pain Score: 6  Pain Location: Rt knee Pain Descriptors / Indicators: Sore;Operative site guarding;Tightness Pain Intervention(s): Limited activity within patient's tolerance;Monitored during session;Repositioned    Home Living Family/patient expects to be discharged to:: Private residence Living Arrangements: Spouse/significant other Available Help at Discharge: Family;Available 24 hours/day Type of Home: House Home Access: Stairs to enter Entrance Stairs-Rails: Left Entrance Stairs-Number of Steps: 3 Home Layout: One level Home Equipment: Cane - single point;Bedside commode;Shower seat;Walker - 2 wheels      Prior Function Level of Independence: Independent         Comments: pt works in a [employer]   Dominant Hand: Right    Extremity/Trunk Assessment   Upper Extremity Assessment Upper Extremity Assessment: Overall WFL for tasks assessed    Lower Extremity Assessment Lower Extremity Assessment: Overall WFL for tasks assessed;RLE deficits/detail RLE Deficits / Details: pt with good quad activaiton in supine, no extensor lag with SLR, 4/5 or greater for quad strength with MMT RLE Sensation: WNL RLE Coordination: WNL    Cervical / Trunk Assessment Cervical / Trunk Assessment: Normal  Communication  Communication: No difficulties  Cognition Arousal/Alertness: Awake/alert Behavior During Therapy: WFL for tasks assessed/performed Overall Cognitive Status: Within Functional Limits for tasks assessed                    General Comments      Exercises Total Joint Exercises Ankle Circles/Pumps: AROM;10 reps;Seated;Both Quad Sets: AROM;10 reps;Seated;Right Heel Slides: 10 reps;AAROM;Seated;Right   Assessment/Plan    PT Assessment Patient needs continued PT services  PT Problem List Decreased strength;Decreased range of motion;Decreased activity tolerance;Decreased balance;Decreased knowledge of use of DME;Decreased mobility;Pain       PT Treatment Interventions Therapeutic activities;DME instruction;Gait training;Therapeutic exercise;Patient/family education;Stair training;Functional mobility training;Balance training    PT Goals (Current goals can be found in the Care Plan section)  Acute Rehab PT Goals Patient Stated Goal: go home and get back to walking PT Goal Formulation: With patient Time For Goal Achievement: 08/22/19 Potential to Achieve Goals: Good    Frequency 7X/week    AM-PAC PT "6 Clicks" Mobility  Outcome Measure Help needed turning from your back to your side while in a flat bed without using bedrails?: A Little Help needed moving from lying on your back to sitting on the side of a flat bed without using bedrails?: A Little Help needed moving to and from a bed to a chair (including a wheelchair)?: A Little Help needed standing up from a chair using your arms (e.g., wheelchair or bedside chair)?: A Little Help needed to walk in hospital room?: A Little Help needed climbing 3-5 steps with a railing? : A Little 6 Click Score: 18    End of Session Equipment Utilized During Treatment: Gait belt Activity Tolerance: Patient tolerated treatment well Patient left: with chair alarm set;with call bell/phone within reach;in bed Nurse Communication: Mobility status PT Visit Diagnosis: Other abnormalities of gait and mobility (R26.89);Difficulty in walking, not elsewhere classified (R26.2)    Time: [id number] PT Time Calculation (min) (ACUTE ONLY): 25 min   Charges:    PT Evaluation $PT Eval Low Complexity: 1 Low PT Treatments $Therapeutic Exercise: 8-22 mins        Kipp Brood, PT, DPT Physical Therapist with [facility]  08/15/2019 4:10 PM

## 2019-08-15 NOTE — Interval H&P Note (Signed)
History and Physical Interval Note:  08/15/2019 7:42 AM  Bryan Atkinson  has presented today for surgery, with the diagnosis of Degenerative joint disease right knee.  The various methods of treatment have been discussed with the patient and family. After consideration of risks, benefits and other options for treatment, the patient has consented to  Procedure(s): COMPUTER ASSISTED TOTAL KNEE ARTHROPLASTY (Right) as a surgical intervention.  The patient's history has been reviewed, patient examined, no change in status, stable for surgery.  I have reviewed the patient's chart and labs.  Questions were answered to the patient's satisfaction.     Hilton Cork Maysun Meditz

## 2019-08-15 NOTE — Anesthesia Postprocedure Evaluation (Signed)
Anesthesia Post Note  Patient: Alexio Sharpsteen Trueba  Procedure(s) Performed: COMPUTER ASSISTED TOTAL KNEE ARTHROPLASTY (Right Knee)     Patient location during evaluation: PACU Anesthesia Type: Spinal Level of consciousness: oriented and awake and alert Pain management: pain level controlled Vital Signs Assessment: post-procedure vital signs reviewed and stable Respiratory status: spontaneous breathing, respiratory function stable and patient connected to nasal cannula oxygen Cardiovascular status: blood pressure returned to baseline and stable Postop Assessment: no headache, no backache and no apparent nausea or vomiting Anesthetic complications: no    Last Vitals:  Vitals:   08/15/19 1215 08/15/19 1230  BP: 122/87 131/85  Pulse: 81 81  Resp: 13 19  Temp:    SpO2: 96% 98%    Last Pain:  Vitals:   08/15/19 1230  TempSrc:   PainSc: 0-No pain                 Marlee Armenteros DAVID

## 2019-08-15 NOTE — Op Note (Signed)
OPERATIVE REPORT  SURGEON: Rod Can, MD   ASSISTANT: Nehemiah Massed, PA-C.  PREOPERATIVE DIAGNOSIS: Right knee arthritis.   POSTOPERATIVE DIAGNOSIS: Right knee arthritis.   PROCEDURE: Right total knee arthroplasty.   IMPLANTS: Stryker Triathlon CR femur, size 5. Stryker universal tibia, size 6. X3 polyethelyene insert, size 9 mm, CR. 3 button asymmetric patella, size 35 mm. Simplex P bone cement.  ANESTHESIA:  MAC, Regional and Spinal  TOURNIQUET TIME: 20 min at 364m Hg.  ESTIMATED BLOOD LOSS: -150 mL  ANTIBIOTICS: 2 g Ancef.  DRAINS: None.  COMPLICATIONS: None   CONDITION: PACU - hemodynamically stable.   BRIEF CLINICAL NOTE: Bryan Weinertis a 63y.o. male with a long-standing history of Right knee arthritis. After failing conservative management, the patient was indicated for total knee arthroplasty. The risks, benefits, and alternatives to the procedure were explained, and the patient elected to proceed.  PROCEDURE IN DETAIL: Spinal anesthesia was obtained in the pre-op holding area. Once inside the operative room, a foley catheter was inserted. The patient was then positioned, a nonsterile tourniquet was placed, and the lower extremity was prepped and draped in the normal sterile surgical fashion. A time-out was called verifying side and site of surgery. The patient received IV antibiotics within 60 minutes of beginning the procedure.   An anterior approach to the knee was performed utilizing a medial midvastus arthrotomy. A medial release was performed and the patellar fat pad was excised. Stryker navigation was used to cut the distal femur perpendicular to the mechanical axis. A freehand patellar resection was performed, and the patella was sized an prepared with 3 lug holes.  Nagivation was used to make a neutral proximal tibia resection, taking 4 mm of  bone from the less affected medial side with 3 degrees of slope. There are several subchondral cysts, which were curetted to a stable bony base. Therefore I elected to proceed with cemented fixation for the tibial component. The menisci were excised. A spacer block was placed, and the alignment and balance in extension were confirmed.   The distal femur was sized using the 3-degree external rotation guide referencing the posterior femoral cortex. The appropriate 4-in-1 cutting block was pinned into place. Rotation was checked using Whiteside's line, the epicondylar axis, and then confirmed with a spacer block in flexion. The remaining femoral cuts were performed, taking care to protect the MCL.  The tibia was sized and the trial tray was pinned into place. The remaining trail components were inserted. The knee was stable to varus and valgus stress through a full range of motion. The patella tracked centrally, and the PCL was well balanced. The trial components were removed, and the proximal tibial surface was prepared. The extremity was exsanguinated with an Esmarch, and the tourniquet was inflated. Small drill holes were made in the sclerotic subchondral bone of the proximal tibia.The cut bony surfaces were irrigated with pulse lavage. Final tibial component was cemented into place and excess cement was cleared. The final femoral and patellar components were press fit into place. The trial insert was placed, and the knee was brought into extension while the cement polymerized. Once the cement was hard, the knee was tested for a final time and found to be well balanced. The trial insert was exchanged for the real polyethylene insert.  The wound was copiously irrigated with Irrisept solution and normal saline using pule lavage.  Marcaine solution was injected into the periarticular soft tissue.  The wound was closed in layers using #1  Vicryl and Stratafix for the fascia, 2-0 Vicryl for the subcutaneous fat,  2-0 Monocryl for the deep dermal layer, and 3-0 vertical mattress sutures for the skin. An Aquacell Ag and compressive dressing were applied.  Tthe patient was transported to the recovery room in stable condition.  Sponge, needle, and instrument counts were correct at the end of the case x2.  The patient tolerated the procedure well and there were no known complications.  Please note that a surgical assistant was a medical necessity for this procedure in order to perform it in a safe and expeditious manner. Surgical assistant was necessary to retract the ligaments and vital neurovascular structures to prevent injury to them and also necessary for proper positioning of the limb to allow for anatomic placement of the prosthesis.

## 2019-08-15 NOTE — Anesthesia Preprocedure Evaluation (Signed)
Anesthesia Evaluation  Patient identified by MRN, date of birth, ID band Patient awake    Reviewed: Allergy & Precautions, NPO status , Patient's Chart, lab work & pertinent test results  Airway Mallampati: I  TM Distance: >3 FB Neck ROM: Full    Dental   Pulmonary    Pulmonary exam normal        Cardiovascular hypertension, Pt. on medications Normal cardiovascular exam     Neuro/Psych    GI/Hepatic   Endo/Other  diabetes, Type 2  Renal/GU      Musculoskeletal   Abdominal   Peds  Hematology   Anesthesia Other Findings   Reproductive/Obstetrics                             Anesthesia Physical Anesthesia Plan  ASA: II  Anesthesia Plan: Spinal   Post-op Pain Management:  Regional for Post-op pain   Induction: Intravenous  PONV Risk Score and Plan: 1 and Ondansetron  Airway Management Planned: Simple Face Mask  Additional Equipment:   Intra-op Plan:   Post-operative Plan:   Informed Consent: I have reviewed the patients History and Physical, chart, labs and discussed the procedure including the risks, benefits and alternatives for the proposed anesthesia with the patient or authorized representative who has indicated his/her understanding and acceptance.       Plan Discussed with: CRNA and Surgeon  Anesthesia Plan Comments:         Anesthesia Quick Evaluation

## 2019-08-15 NOTE — Anesthesia Procedure Notes (Signed)
Anesthesia Regional Block: Adductor canal block   Pre-Anesthetic Checklist: ,, timeout performed, Correct Patient, Correct Site, Correct Laterality, Correct Procedure, Correct Position, site marked, Risks and benefits discussed,  Surgical consent,  Pre-op evaluation,  At surgeon's request and post-op pain management  Laterality: Right  Prep: chloraprep       Needles:  Injection technique: Single-shot  Needle Type: Echogenic Stimulator Needle     Needle Length: 9cm  Needle Gauge: 21     Additional Needles:   Narrative:  Start time: 08/15/2019 7:02 AM End time: 08/15/2019 7:12 AM Injection made incrementally with aspirations every 5 mL.  Performed by: Personally  Anesthesiologist: Lillia Abed, MD  Additional Notes: Monitors applied. Patient sedated. Sterile prep and drape,hand hygiene and sterile gloves were used. Relevant anatomy identified.Needle position confirmed.Local anesthetic injected incrementally after negative aspiration. Local anesthetic spread visualized around nerve(s). Vascular puncture avoided. No complications. Image printed for medical record.The patient tolerated the procedure well.    Lillia Abed MD

## 2019-08-16 ENCOUNTER — Encounter (HOSPITAL_COMMUNITY): Payer: Self-pay | Admitting: Orthopedic Surgery

## 2019-08-16 LAB — CBC
HCT: 30.8 % — ABNORMAL LOW (ref 39.0–52.0)
Hemoglobin: 9.9 g/dL — ABNORMAL LOW (ref 13.0–17.0)
MCH: 30.1 pg (ref 26.0–34.0)
MCHC: 32.1 g/dL (ref 30.0–36.0)
MCV: 93.6 fL (ref 80.0–100.0)
Platelets: 354 10*3/uL (ref 150–400)
RBC: 3.29 MIL/uL — ABNORMAL LOW (ref 4.22–5.81)
RDW: 13.2 % (ref 11.5–15.5)
WBC: 16.6 10*3/uL — ABNORMAL HIGH (ref 4.0–10.5)
nRBC: 0 % (ref 0.0–0.2)

## 2019-08-16 LAB — BASIC METABOLIC PANEL
Anion gap: 8 (ref 5–15)
BUN: 32 mg/dL — ABNORMAL HIGH (ref 8–23)
CO2: 21 mmol/L — ABNORMAL LOW (ref 22–32)
Calcium: 8.9 mg/dL (ref 8.9–10.3)
Chloride: 105 mmol/L (ref 98–111)
Creatinine, Ser: 1.65 mg/dL — ABNORMAL HIGH (ref 0.61–1.24)
GFR calc Af Amer: 51 mL/min — ABNORMAL LOW (ref >60)
GFR calc non Af Amer: 44 mL/min — ABNORMAL LOW (ref >60)
Glucose, Bld: 140 mg/dL — ABNORMAL HIGH (ref 70–99)
Potassium: 4 mmol/L (ref 3.5–5.1)
Sodium: 134 mmol/L — ABNORMAL LOW (ref 135–145)

## 2019-08-16 LAB — GLUCOSE, CAPILLARY: Glucose-Capillary: 115 mg/dL — ABNORMAL HIGH (ref 70–99)

## 2019-08-16 MED ORDER — CEFADROXIL 500 MG PO CAPS: 500.0000 mg | ORAL_CAPSULE | Freq: Two times a day (BID) | ORAL | 0 refills | Status: AC

## 2019-08-16 MED ORDER — HYDROCODONE-ACETAMINOPHEN 5-325 MG PO TABS: 1.0000 | ORAL_TABLET | ORAL | 0 refills | Status: DC | PRN

## 2019-08-16 MED ORDER — ASPIRIN 81 MG PO CHEW: 81.0000 mg | CHEWABLE_TABLET | Freq: Two times a day (BID) | ORAL | 1 refills | Status: AC

## 2019-08-16 MED ORDER — GABAPENTIN 300 MG PO CAPS: 300.0000 mg | ORAL_CAPSULE | Freq: Every day | ORAL | 0 refills | Status: DC

## 2019-08-16 NOTE — Progress Notes (Signed)
    Subjective:  Patient reports pain as mild to moderate.  Denies N/V/CP/SOB. No c/o.  Objective:   VITALS:   Vitals:   08/15/19 2110 08/16/19 0138 08/16/19 0447 08/16/19 0757  BP: (!) 131/104 (!) 143/94 (!) 143/92 (!) 136/92  Pulse: 95 81 79 74  Resp: 16 16 14    Temp: 97.8 F (36.6 C) 97.7 F (36.5 C) 98 F (36.7 C)   TempSrc: Oral Oral Oral   SpO2: 96% 98% 98%   Weight:      Height:        NAD ABD soft Sensation intact distally Intact pulses distally Dorsiflexion/Plantar flexion intact Incision: dressing C/D/I Compartment soft   Lab Results  Component Value Date   WBC 16.6 (H) 08/16/2019   HGB 9.9 (L) 08/16/2019   HCT 30.8 (L) 08/16/2019   MCV 93.6 08/16/2019   PLT 354 08/16/2019   BMET    Component Value Date/Time   NA 134 (L) 08/16/2019 0217   K 4.0 08/16/2019 0217   CL 105 08/16/2019 0217   CO2 21 (L) 08/16/2019 0217   GLUCOSE 140 (H) 08/16/2019 0217   BUN 32 (H) 08/16/2019 0217   CREATININE 1.65 (H) 08/16/2019 0217   CALCIUM 8.9 08/16/2019 0217   GFRNONAA 44 (L) 08/16/2019 0217   GFRAA 51 (L) 08/16/2019 0217     Assessment/Plan: 1 Day Post-Op   Principal Problem:   Osteoarthritis of right knee   WBAT with walker DVT ppx: Aspirin, SCDs, TEDS PO pain control PT/OT Dispo: D/C home with OPPT    Hilton Cork Orey Moure 08/16/2019, 8:25 AM   Rod Can, MD [phone] [place] is now [facility]  [place] [address], [place], [place] [address] Phone: [phone] [email or web address] Facebook  [employer]

## 2019-08-16 NOTE — Plan of Care (Signed)
  Problem: Education: Goal: Knowledge of General Education information will improve Description Including pain rating scale, medication(s)/side effects and non-pharmacologic comfort measures Outcome: Progressing   Problem: Health Behavior/Discharge Planning: Goal: Ability to manage health-related needs will improve Outcome: Progressing   Problem: Coping: Goal: Level of anxiety will decrease Outcome: Progressing   Problem: Pain Managment: Goal: General experience of comfort will improve Outcome: Progressing   Problem: Pain Management: Goal: Pain level will decrease with appropriate interventions Outcome: Progressing   

## 2019-08-16 NOTE — Progress Notes (Signed)
Physical Therapy Treatment Patient Details Name: Bryan Atkinson MRN: [id number] DOB: 05/21/1956 Today's Date: 08/16/2019    History of Present Illness Patient is 63 y.o. male s/p Rt TKA on 08/15/19 with PMH significant for OA, DM, HTN, and past Lt TKA on 06/27/19.    PT Comments    Pt progressing well, ready for d/c from PT standpoint.   Follow Up Recommendations  Follow surgeon's recommendation for DC plan and follow-up therapies     Equipment Recommendations  None recommended by PT    Recommendations for Other Services       Precautions / Restrictions Precautions Precautions: Fall Restrictions Weight Bearing Restrictions: No Other Position/Activity Restrictions: WBAT    Mobility  Bed Mobility               General bed mobility comments: pt in recliner   Transfers Overall transfer level: Needs assistance Equipment used: Rolling walker (2 wheeled) Transfers: Sit to/from Stand Sit to Stand: Supervision         General transfer comment: pt demo's good hand placement, good safety awareness   Ambulation/Gait Ambulation/Gait assistance: Supervision Gait Distance (Feet): 240 Feet Assistive device: Rolling walker (2 wheeled) Gait Pattern/deviations: Step-through pattern;Decreased stance time - left     General Gait Details: cues for gait progression. cues for heel strike and knee extension on L    Stairs Stairs: Yes Stairs assistance: Supervision Stair Management: One rail Left;Step to pattern;Forwards;With crutches Number of Stairs: 3 General stair comments: cues for sequence and safe technique    Wheelchair Mobility    Modified Rankin (Stroke Patients Only)       Balance                                            Cognition Arousal/Alertness: Awake/alert Behavior During Therapy: WFL for tasks assessed/performed Overall Cognitive Status: Within Functional Limits for tasks assessed                                        Exercises Total Joint Exercises Ankle Circles/Pumps: AROM;Both;10 reps Quad Sets: AROM;Both;10 reps Short Arc Quad: AROM;Right;10 reps Heel Slides: AROM;Right;10 reps Hip ABduction/ADduction: AROM;Right;10 reps Straight Leg Raises: Right;AROM;10 reps Long Arc Quad: AROM;Right;10 reps Goniometric ROM: grossly 0 to 100 degrees rigth knee flexion     General Comments        Pertinent Vitals/Pain Pain Assessment: No/denies pain    Home Living                      Prior Function            PT Goals (current goals can now be found in the care plan section) Acute Rehab PT Goals Patient Stated Goal: go home and get back to walking PT Goal Formulation: With patient Time For Goal Achievement: 08/22/19 Potential to Achieve Goals: Good Progress towards PT goals: Progressing toward goals    Frequency    7X/week      PT Plan Current plan remains appropriate    Co-evaluation              AM-PAC PT "6 Clicks" Mobility   Outcome Measure  Help needed turning from your back to your side while in a flat bed without using bedrails?: None Help needed  moving from lying on your back to sitting on the side of a flat bed without using bedrails?: None Help needed moving to and from a bed to a chair (including a wheelchair)?: None Help needed standing up from a chair using your arms (e.g., wheelchair or bedside chair)?: None Help needed to walk in hospital room?: A Little Help needed climbing 3-5 steps with a railing? : A Little 6 Click Score: 22    End of Session Equipment Utilized During Treatment: Gait belt Activity Tolerance: Patient tolerated treatment well Patient left: in chair;with call bell/phone within reach;with chair alarm set Nurse Communication: Mobility status PT Visit Diagnosis: Other abnormalities of gait and mobility (R26.89);Difficulty in walking, not elsewhere classified (R26.2)     Time: [id number] PT Time Calculation (min)  (ACUTE ONLY): 18 min  Charges:  $Gait Training: 8-22 mins                     Kenyon Ana, PT  Pager: [phone] Acute Rehab Dept [facility]): [id number]   08/16/2019    [facility] 08/16/2019, 10:28 AM

## 2019-08-16 NOTE — Discharge Summary (Signed)
Physician Discharge Summary  Patient ID: Bryan Atkinson MRN: [id number] DOB/AGE: May 01, 1956 63 y.o.  Admit date: 08/15/2019 Discharge date: 08/16/2019  Admission Diagnoses:  Osteoarthritis of right knee  Discharge Diagnoses:  Principal Problem:   Osteoarthritis of right knee   Past Medical History:  Diagnosis Date  . Arthritis   . Diabetes mellitus without complication ([place])   . Gout   . History of kidney stones   . Hypertension     Surgeries: Procedure(s): COMPUTER ASSISTED TOTAL KNEE ARTHROPLASTY on 08/15/2019   Consultants (if any):   Discharged Condition: Improved  Hospital Course: Bryan Atkinson is an 63 y.o. male who was admitted 08/15/2019 with a diagnosis of Osteoarthritis of right knee and went to the operating room on 08/15/2019 and underwent the above named procedures.    He was given perioperative antibiotics:  Anti-infectives (From admission, onward)   Start     Dose/Rate Route Frequency Ordered Stop   08/16/19 0000  cefadroxil (DURICEF) 500 MG capsule     500 mg Oral 2 times daily 08/16/19 0823 08/30/19 2359   08/15/19 1400  ceFAZolin (ANCEF) IVPB 2g/100 mL premix     2 g 200 mL/hr over 30 Minutes Intravenous Every 6 hours 08/15/19 1238 08/15/19 2015   08/15/19 0600  ceFAZolin (ANCEF) IVPB 2g/100 mL premix     2 g 200 mL/hr over 30 Minutes Intravenous On call to O.R. 08/15/19 [id number] 08/15/19 [id number]    .  He was given sequential compression devices, early ambulation, and ASA for DVT prophylaxis.  He benefited maximally from the hospital stay and there were no complications.    Recent vital signs:  Vitals:   08/16/19 0757 08/16/19 0933  BP: (!) 136/92 (!) 136/94  Pulse: 74 81  Resp:  19  Temp:  98.1 F (36.7 C)  SpO2:  98%    Recent laboratory studies:  Lab Results  Component Value Date   HGB 9.9 (L) 08/16/2019   HGB 12.1 (L) 08/12/2019   HGB 10.9 (L) 06/28/2019   Lab Results  Component Value Date   WBC 16.6 (H) 08/16/2019    PLT 354 08/16/2019   Lab Results  Component Value Date   INR 1.0 08/12/2019   Lab Results  Component Value Date   NA 134 (L) 08/16/2019   K 4.0 08/16/2019   CL 105 08/16/2019   CO2 21 (L) 08/16/2019   BUN 32 (H) 08/16/2019   CREATININE 1.65 (H) 08/16/2019   GLUCOSE 140 (H) 08/16/2019    Discharge Medications:   Allergies as of 08/16/2019   No Known Allergies     Medication List    TAKE these medications   allopurinol 300 MG tablet Commonly known as: ZYLOPRIM Take 300 mg by mouth daily.   amLODipine 10 MG tablet Commonly known as: NORVASC Take 10 mg by mouth daily.   Apple Cider Vinegar 500 MG Tabs Take 500 mg by mouth daily.   aspirin 81 MG chewable tablet Chew 1 tablet (81 mg total) by mouth 2 (two) times daily.   atenolol 50 MG tablet Commonly known as: TENORMIN Take 50 mg by mouth 2 (two) times daily.   cefadroxil 500 MG capsule Commonly known as: DURICEF Take 1 capsule (500 mg total) by mouth 2 (two) times daily for 14 days.   Co-Enzyme Q10 100 MG Caps Take 200 mg by mouth daily.   Cranberry 500 MG Tabs Take 500 mg by mouth daily.   docusate sodium 100 MG capsule Commonly known as:  COLACE Take 1 capsule (100 mg total) by mouth 2 (two) times daily.   fenofibrate 145 MG tablet Commonly known as: TRICOR Take 145 mg by mouth daily.   Flaxseed Oil 1000 MG Caps Take 1,000 mg by mouth daily.   gabapentin 300 MG capsule Commonly known as: NEURONTIN Take 1 capsule (300 mg total) by mouth at bedtime.   glucose blood test strip 1 each by Other route as needed for other. Use as instructed   HYDROcodone-acetaminophen 5-325 MG tablet Commonly known as: NORCO/VICODIN Take 1 tablet by mouth every 4 (four) hours as needed for moderate pain (pain score 4-6).   Krill Oil 500 MG Caps Take 500 mg by mouth 2 (two) times a day.   lisinopril-hydrochlorothiazide 20-25 MG tablet Commonly known as: ZESTORETIC Take 1 tablet by mouth 2 (two) times daily.    metFORMIN 1000 MG tablet Commonly known as: GLUCOPHAGE Take 1,000 mg by mouth 2 (two) times daily.   multivitamin tablet Take 1 tablet by mouth daily.   naproxen 500 MG tablet Commonly known as: NAPROSYN Take 500 mg by mouth 2 (two) times daily.   niacin 1000 MG CR tablet Commonly known as: NIASPAN Take 1,000 mg by mouth at bedtime.   ondansetron 4 MG tablet Commonly known as: ZOFRAN Take 1 tablet (4 mg total) by mouth every 6 (six) hours as needed for nausea.   pravastatin 40 MG tablet Commonly known as: PRAVACHOL Take 40 mg by mouth at bedtime.   senna 8.6 MG Tabs tablet Commonly known as: SENOKOT Take 1 tablet (8.6 mg total) by mouth 2 (two) times daily.   Turmeric 500 MG Caps Take 500 mg by mouth daily.       Diagnostic Studies: Dg Knee Right Port  Result Date: 08/15/2019 CLINICAL DATA:  Right knee arthroplasty. EXAM: PORTABLE RIGHT KNEE - 1-2 VIEW COMPARISON:  No prior. FINDINGS: Total right knee replacement. Hardware intact. Anatomic alignment. No acute bony abnormality. IMPRESSION: Total right knee replacement.  Anatomic alignment. Electronically Signed   By: [place]   On: 08/15/2019 12:15    Disposition: Discharge disposition: 01-Home or Self Care       Discharge Instructions    Call MD / Call 911   Complete by: As directed    If you experience chest pain or shortness of breath, CALL 911 and be transported to the hospital emergency room.  If you develope a fever above 101 F, pus (white drainage) or increased drainage or redness at the wound, or calf pain, call your surgeon's office.   Constipation Prevention   Complete by: As directed    Drink plenty of fluids.  Prune juice may be helpful.  You may use a stool softener, such as Colace (over the counter) 100 mg twice a day.  Use MiraLax (over the counter) for constipation as needed.   Diet - low sodium heart healthy   Complete by: As directed    Do not put a pillow under the knee. Place it  under the heel.   Complete by: As directed    Driving restrictions   Complete by: As directed    No driving for 6 weeks   Increase activity slowly as tolerated   Complete by: As directed    Lifting restrictions   Complete by: As directed    No lifting for 6 weeks   TED hose   Complete by: As directed    Use stockings (TED hose) for 2 weeks on both leg(s).  You  may remove them at night for sleeping.      Follow-up Information    Camari Quintanilla, Aaron Edelman, MD. Schedule an appointment as soon as possible for a visit in 2 weeks.   Specialty: Orthopedic Surgery Why: For wound re-check, For suture removal Contact information: [address] [place] [address] [id number]            Signed: Hilton Cork Aquita Simmering 08/16/2019, 1:55 PM

## 2019-08-19 ENCOUNTER — Ambulatory Visit: Payer: 59 | Attending: Orthopedic Surgery | Admitting: Physical Therapy

## 2019-08-19 ENCOUNTER — Encounter: Payer: Self-pay | Admitting: Physical Therapy

## 2019-08-19 ENCOUNTER — Other Ambulatory Visit: Payer: Self-pay

## 2019-08-19 DIAGNOSIS — M25561 Pain in right knee: Secondary | ICD-10-CM | POA: Insufficient documentation

## 2019-08-19 DIAGNOSIS — M25661 Stiffness of right knee, not elsewhere classified: Secondary | ICD-10-CM | POA: Insufficient documentation

## 2019-08-19 NOTE — Therapy (Signed)
[facility] [place], [place], [address] Phone: [phone]   Fax:  [phone]  Physical Therapy Evaluation  Patient Details  Name: Bryan Atkinson MRN: [id number] Date of Birth: 09-Apr-1956 Referring Provider (PT): Rod Can, MD   Encounter Date: 08/19/2019  PT End of Session - 08/19/19 1106    Visit Number  1    Number of Visits  12    Date for PT Re-Evaluation  10/07/19    Authorization Type  FOTO; progress note every 10th visit    PT Start Time  0816    PT Stop Time  0858    PT Time Calculation (min)  42 min    Activity Tolerance  Patient tolerated treatment well    Behavior During Therapy  [facility] for tasks assessed/performed       Past Medical History:  Diagnosis Date  . Arthritis   . Diabetes mellitus without complication ([place])   . Gout   . History of kidney stones   . Hypertension     Past Surgical History:  Procedure Laterality Date  . CATARACT EXTRACTION W/PHACO Right 05/13/2019   Procedure: CATARACT EXTRACTION PHACO AND INTRAOCULAR LENS PLACEMENT (IOC);  Surgeon: Baruch Goldmann, MD;  Location: [facility];  Service: Ophthalmology;  Laterality: Right;  CDE: 10.31  . CATARACT EXTRACTION W/PHACO Left 05/27/2019   Procedure: CATARACT EXTRACTION PHACO AND INTRAOCULAR LENS PLACEMENT (IOC);  Surgeon: Baruch Goldmann, MD;  Location: [facility];  Service: Ophthalmology;  Laterality: Left;  CDE: 6.49  . COLONOSCOPY     times two 2002 ,2007  . COLONOSCOPY N/A 10/07/2013   Procedure: COLONOSCOPY;  Surgeon: Daneil Dolin, MD;  Location: [facility];  Service: Endoscopy;  Laterality: N/A;  8:30 AM  . COLONOSCOPY N/A 10/05/2018   Procedure: COLONOSCOPY;  Surgeon: Daneil Dolin, MD;  [facility]: [facility];  Service: Endoscopy;  Laterality: N/A;  8:30  . JOINT REPLACEMENT    . KNEE ARTHROPLASTY Left 06/27/2019   Procedure: COMPUTER ASSISTED TOTAL KNEE ARTHROPLASTY;  Surgeon: Rod Can, MD;  Location: [facility];   Service: Orthopedics;  Laterality: Left;  . KNEE ARTHROPLASTY Right 08/15/2019   Procedure: COMPUTER ASSISTED TOTAL KNEE ARTHROPLASTY;  Surgeon: Rod Can, MD;  Location: [facility];  Service: Orthopedics;  Laterality: Right;  . LEG SURGERY Right early 103's  . NO PAST SURGERIES      There were no vitals filed for this visit.   Subjective Assessment - 08/19/19 1057    Subjective  COVID-19 screening performed upon arrival.Patient arrives to physical therapy with reports of right knee pain, decreased right knee ROM, and difficulty walking due to a right total knee replacement on 08/15/2019. Patient reports doing well at home and is ambulating with a rolling walker. Patient reports he was not compliant with HEP during the weekend due to constant walking to and from the generator. Patient reports having his wife for assistance when needed. Patient reports pain at worst 6/10 and pain at best as 2/10. Patient's goals are to decrease pain, improve movement, and improve ability to perform home activities.    Patient is accompained by:  Family member    Pertinent History  right TKA 08/15/2019; left TKA 9/10/202; HTN, DM    Limitations  Sitting;Standing;Walking;House hold activities    How long can you sit comfortably?  1 hr    How long can you stand comfortably?  20-30 mins    How long can you walk comfortably?  room to room  Diagnostic tests  x-ray    Patient Stated Goals  decrease pain, return to normal activities.    Currently in Pain?  No/denies    Pain Score  3     Pain Location  Knee    Pain Orientation  Right    Pain Descriptors / Indicators  Dull;Aching    Pain Type  Surgical pain    Pain Onset  In the past 7 days    Aggravating Factors   sititng    Pain Relieving Factors  meds, ice    Effect of Pain on Daily Activities  increased time to perform ADLs         [facility] PT Assessment - 08/19/19 0001      Assessment   Medical Diagnosis  osteoarthritis of left knee    Referring  Provider (PT)  Rod Can, MD    Onset Date/Surgical Date  08/15/19    Next MD Visit  08/30/2019    Prior Therapy  yes      Precautions   Precautions  Other (comment)    Precaution Comments  no ultrasound      [place] residence    Living Arrangements  Spouse/significant other    Type of [place] to enter    Entrance Stairs-Number of Steps  3    Entrance Stairs-Rails  Left      Prior Function   Level of Independence  Independent with basic ADLs;Needs assistance with homemaking      Observation/Other Assessments   Focus on Therapeutic Outcomes (FOTO)   53% limited      Observation/Other Assessments-Edema    Edema  Circumferential      Circumferential Edema   Circumferential - Right  45 cm at mid patella    Circumferential - Left   40 cm at mid patella      ROM / Strength   AROM / PROM / Strength  PROM;AROM      AROM   Overall AROM   Deficits    AROM Assessment Site  Knee    Right/Left Knee  Right    Right Knee Extension  1    Right Knee Flexion  95      PROM   Overall PROM   Deficits    PROM Assessment Site  Knee    Right/Left Knee  Right    Right Knee Extension  0    Right Knee Flexion  102      Ambulation/Gait   Assistive device  Rolling walker    Gait Pattern  Step-through pattern;Decreased stance time - right;Decreased step length - left;Decreased stride length;Decreased hip/knee flexion - right                Objective measurements completed on examination: See above findings.      [facility] Adult PT Treatment/Exercise - 08/19/19 0001      Modalities   Modalities  Vasopneumatic      Vasopneumatic   Number Minutes Vasopneumatic   10 minutes    Vasopnuematic Location   Knee    Vasopneumatic Pressure  Low    Vasopneumatic Temperature   60               PT Short Term Goals - 08/19/19 1254      PT SHORT TERM GOAL #1   Title  Patient will be independent with initial  HEP    Time  3  Period  Weeks    Status  New      PT SHORT TERM GOAL #2   Title  Patient will demonstrate 100+ degrees of right knee flexion AROM to improve ability to perform functional tasks.    Time  2    Period  Weeks    Status  New      PT SHORT TERM GOAL #3   Title  Patient will ambulate with straight cane or no AD with minimal gait deviations to improve gait mechanics.    Time  2    Period  Weeks    Status  New        PT Long Term Goals - 08/19/19 1255      PT LONG TERM GOAL #1   Title  Patient will be independent with advanced HEP    Time  4    Period  Weeks    Status  New      PT LONG TERM GOAL #2   Title  Patient will demonstrate 115+ degrees of right knee flexion AROM to improve ability to perform functional tasks.    Time  4    Period  Weeks    Status  New      PT LONG TERM GOAL #3   Title  Patient will demonstrate 0 degrees of right knee extension AROM to improve gait mechanics.    Time  4    Period  Weeks    Status  New      PT LONG TERM GOAL #4   Title  Patient will report ability to perform ADLs and home activities with right knee pain less than or equal to 3/10.    Time  4    Period  Weeks    Status  New      PT LONG TERM GOAL #5   Title  Patient will demonstrate reciprocating stair negotiation with 1 rail and no AD to safely enter and exit home.    Time  4    Period  Weeks    Status  New             Plan - 08/19/19 1252    Clinical Impression Statement  Patient is a 63 year old male who presents to physical therapy with right knee pain, decreased right knee ROM and difficulty walking secondary to right TKA on 08/15/2019. Patient is compliant with icing and elevation. Patient noted with increased right knee edema and noted with aquacel dressing donned. Patient ambulates with rolling walker with decreased right stance time, decreased left step length, and decreased right knee flexion. Patient and PT discussed HEP as well as when to  progress to ambulation with a cane. Patient reported understanding. Patient would benefit from skilled physical therapy to address deficits and patient's goals.    Personal Factors and Comorbidities  Age;Comorbidity 2    Comorbidities  L TKA 06/27/2019; HTN, DM    Examination-Activity Limitations  Stand;Stairs;Sit    Examination-Participation Restrictions  Driving    Stability/Clinical Decision Making  Stable/Uncomplicated    Clinical Decision Making  Low    Rehab Potential  Good    PT Frequency  3x / week    PT Duration  6 weeks    PT Treatment/Interventions  ADLs/Self Care Home Management;Cryotherapy;Electrical Stimulation;Iontophoresis 4mg /ml Dexamethasone;Moist Heat;Balance training;Therapeutic exercise;Therapeutic activities;Functional mobility training;Stair training;Gait training;Neuromuscular re-education;Patient/family education;Passive range of motion;Vasopneumatic Device;Taping;Manual techniques    PT Next Visit Plan  Nustep, progress to bike when ready, AROM, PROM to  right knee modalities PRN for pain relief.    PT Home Exercise Plan  see patient education section    Consulted and Agree with Plan of Care  Patient       Patient will benefit from skilled therapeutic intervention in order to improve the following deficits and impairments:  Decreased activity tolerance, Decreased strength, Decreased range of motion, Difficulty walking, Pain  Visit Diagnosis: Acute pain of right knee - Plan: PT plan of care cert/re-cert  Stiffness of right knee, not elsewhere classified - Plan: PT plan of care cert/re-cert     Problem List Patient Active Problem List   Diagnosis Date Noted  . Osteoarthritis of right knee 08/15/2019  . Osteoarthritis of left knee 06/27/2019    Gabriela Eves, PT, DPT 08/19/2019, 2:05 PM  [facility] [address] [place], [place], [address] Phone: [phone]   Fax:  [phone]  Name: Bryan Atkinson MRN: [id number] Date of Birth: Mar 23, 1956

## 2019-08-21 ENCOUNTER — Ambulatory Visit: Payer: 59 | Admitting: Physical Therapy

## 2019-08-21 ENCOUNTER — Encounter: Payer: Self-pay | Admitting: Physical Therapy

## 2019-08-21 ENCOUNTER — Other Ambulatory Visit: Payer: Self-pay

## 2019-08-21 DIAGNOSIS — M25561 Pain in right knee: Secondary | ICD-10-CM | POA: Diagnosis not present

## 2019-08-21 DIAGNOSIS — M25661 Stiffness of right knee, not elsewhere classified: Secondary | ICD-10-CM

## 2019-08-21 NOTE — Therapy (Signed)
[facility] [place], [place], [address] Phone: [phone]   Fax:  [phone]  Physical Therapy Treatment  Patient Details  Name: Bryan Atkinson MRN: [id number] Date of Birth: 11/06/1955 Referring Provider (PT): Rod Can, MD   Encounter Date: 08/21/2019  PT End of Session - 08/21/19 0840    Visit Number  2    Number of Visits  12    Date for PT Re-Evaluation  10/07/19    Authorization Type  FOTO; progress note every 10th visit    PT Start Time  0815    PT Stop Time  0906    PT Time Calculation (min)  51 min    Activity Tolerance  Patient tolerated treatment well    Behavior During Therapy  [facility] for tasks assessed/performed       Past Medical History:  Diagnosis Date  . Arthritis   . Diabetes mellitus without complication ([place])   . Gout   . History of kidney stones   . Hypertension     Past Surgical History:  Procedure Laterality Date  . CATARACT EXTRACTION W/PHACO Right 05/13/2019   Procedure: CATARACT EXTRACTION PHACO AND INTRAOCULAR LENS PLACEMENT (IOC);  Surgeon: Baruch Goldmann, MD;  Location: [facility];  Service: Ophthalmology;  Laterality: Right;  CDE: 10.31  . CATARACT EXTRACTION W/PHACO Left 05/27/2019   Procedure: CATARACT EXTRACTION PHACO AND INTRAOCULAR LENS PLACEMENT (IOC);  Surgeon: Baruch Goldmann, MD;  Location: [facility];  Service: Ophthalmology;  Laterality: Left;  CDE: 6.49  . COLONOSCOPY     times two 2002 ,2007  . COLONOSCOPY N/A 10/07/2013   Procedure: COLONOSCOPY;  Surgeon: Daneil Dolin, MD;  [facility];  Service: Endoscopy;  Laterality: N/A;  8:30 AM  . COLONOSCOPY N/A 10/05/2018   Procedure: COLONOSCOPY;  Surgeon: Daneil Dolin, MD;  [facility];  Service: Endoscopy;  Laterality: N/A;  8:30  . JOINT REPLACEMENT    . KNEE ARTHROPLASTY Left 06/27/2019   Procedure: COMPUTER ASSISTED TOTAL KNEE ARTHROPLASTY;  Surgeon: Rod Can, MD;  [facility];   Service: Orthopedics;  Laterality: Left;  . KNEE ARTHROPLASTY Right 08/15/2019   Procedure: COMPUTER ASSISTED TOTAL KNEE ARTHROPLASTY;  Surgeon: Rod Can, MD;  [facility]: [facility];  Service: Orthopedics;  Laterality: Right;  . LEG SURGERY Right early 55's  . NO PAST SURGERIES      There were no vitals filed for this visit.  Subjective Assessment - 08/21/19 0839    Subjective  COVID-19 screening performed upon arrival. Patient arrives ambulating with a straight cane with "a little bit of pain." Patient also reported getting down on the floor to do HEP and needed his son to help him up.    Pertinent History  right TKA 08/15/2019; left TKA 9/10/202; HTN, DM    Limitations  Sitting;Standing;Walking;House hold activities    How long can you sit comfortably?  1 hr    How long can you stand comfortably?  20-30 mins    How long can you walk comfortably?  room to room    Diagnostic tests  x-ray    Patient Stated Goals  decrease pain, return to normal activities.    Currently in Pain?  Yes   " little bit" no pain scale number provided        [facility] PT Assessment - 08/21/19 0001      Assessment   Medical Diagnosis  osteoarthritis of right knee    Referring Provider (PT)  Rod Can,  MD    Onset Date/Surgical Date  08/15/19    Next MD Visit  08/30/2019    Prior Therapy  yes      Precautions   Precautions  Other (comment)    Precaution Comments  no ultrasound                   [facility] Adult PT Treatment/Exercise - 08/21/19 0001      Exercises   Exercises  Knee/Hip      Knee/Hip Exercises: Aerobic   Nustep  Level 4 x10 mins Seat 10 to 9      Knee/Hip Exercises: Standing   Hip Flexion  AROM;Both;20 reps;Knee bent    Forward Lunges  Right;20 reps    Hip Abduction  AROM;Both;2 sets;10 reps;Knee straight      Knee/Hip Exercises: Supine   Quad Sets  AROM;Right;2 sets;10 reps    [employer] Limitations  5" hold    Short Arc Quad Sets  AROM;Right;2 sets;10 reps     Short Arc Quad Sets Limitations  5" hold      Modalities   Modalities  Vasopneumatic      Vasopneumatic   Number Minutes Vasopneumatic   10 minutes    Vasopnuematic Location   Knee    Vasopneumatic Pressure  Low    Vasopneumatic Temperature   60      Manual Therapy   Manual Therapy  Passive ROM    Passive ROM  PROM into right knee flexion and extension with gentle overpressure to improve ROM, intermittent oscillations for muscle relaxation               PT Short Term Goals - 08/19/19 1254      PT SHORT TERM GOAL #1   Title  Patient will be independent with initial HEP    Time  3    Period  Weeks    Status  New      PT SHORT TERM GOAL #2   Title  Patient will demonstrate 100+ degrees of right knee flexion AROM to improve ability to perform functional tasks.    Time  2    Period  Weeks    Status  New      PT SHORT TERM GOAL #3   Title  Patient will ambulate with straight cane or no AD with minimal gait deviations to improve gait mechanics.    Time  2    Period  Weeks    Status  New        PT Long Term Goals - 08/19/19 1255      PT LONG TERM GOAL #1   Title  Patient will be independent with advanced HEP    Time  4    Period  Weeks    Status  New      PT LONG TERM GOAL #2   Title  Patient will demonstrate 115+ degrees of right knee flexion AROM to improve ability to perform functional tasks.    Time  4    Period  Weeks    Status  New      PT LONG TERM GOAL #3   Title  Patient will demonstrate 0 degrees of right knee extension AROM to improve gait mechanics.    Time  4    Period  Weeks    Status  New      PT LONG TERM GOAL #4   Title  Patient will report ability to perform ADLs and home activities with  right knee pain less than or equal to 3/10.    Time  4    Period  Weeks    Status  New      PT LONG TERM GOAL #5   Title  Patient will demonstrate reciprocating stair negotiation with 1 rail and no AD to safely enter and exit home.    Time  4     Period  Weeks    Status  New            Plan - 08/21/19 0915    Clinical Impression Statement  Patient responded well to treatment and was able to demonstrate good form with all exercises. Patient noted with slight increase of soreness with end range PROM as well as tightness due to aquacel dressing. Patient instructed to perform HEP on the bed rather than the floor for ease of getting up/down. Patient reported understanding. Normal response to modalities upon removal.    Personal Factors and Comorbidities  Age;Comorbidity 2    Comorbidities  R TKA 08/15/2019; L TKA 06/27/2019; HTN, DM    Examination-Activity Limitations  Stand;Stairs;Sit    Examination-Participation Restrictions  Driving    Stability/Clinical Decision Making  Stable/Uncomplicated    Clinical Decision Making  Low    Rehab Potential  Good    PT Frequency  3x / week    PT Duration  6 weeks    PT Treatment/Interventions  ADLs/Self Care Home Management;Cryotherapy;Electrical Stimulation;Iontophoresis 4mg /ml Dexamethasone;Moist Heat;Balance training;Therapeutic exercise;Therapeutic activities;Functional mobility training;Stair training;Gait training;Neuromuscular re-education;Patient/family education;Passive range of motion;Vasopneumatic Device;Taping;Manual techniques    PT Next Visit Plan  Nustep, progress to bike when ready, AROM, PROM to right knee modalities PRN for pain relief.    Consulted and Agree with Plan of Care  Patient       Patient will benefit from skilled therapeutic intervention in order to improve the following deficits and impairments:  Decreased activity tolerance, Decreased strength, Decreased range of motion, Difficulty walking, Pain  Visit Diagnosis: Acute pain of right knee  Stiffness of right knee, not elsewhere classified     Problem List Patient Active Problem List   Diagnosis Date Noted  . Osteoarthritis of right knee 08/15/2019  . Osteoarthritis of left knee 06/27/2019    Gabriela Eves, PT, DPT 08/21/2019, 9:21 AM  [facility] [place], [place], [address] Phone: [phone]   Fax:  [phone]  Name: Haben Billingsley MRN: [id number] Date of Birth: [birth year]

## 2019-08-23 ENCOUNTER — Other Ambulatory Visit: Payer: Self-pay

## 2019-08-23 ENCOUNTER — Ambulatory Visit: Payer: 59 | Admitting: *Deleted

## 2019-08-23 DIAGNOSIS — M25561 Pain in right knee: Secondary | ICD-10-CM | POA: Diagnosis not present

## 2019-08-23 DIAGNOSIS — M25661 Stiffness of right knee, not elsewhere classified: Secondary | ICD-10-CM

## 2019-08-23 NOTE — Therapy (Signed)
[facility] [place], [place], [address] Phone: [phone]   Fax:  [phone]  Physical Therapy Treatment  Patient Details  Name: Bryan Atkinson MRN: [id number] Date of Birth: 26-Nov-1955 Referring Provider (PT): Rod Can, MD   Encounter Date: 08/23/2019  PT End of Session - 08/23/19 0855    Visit Number  3    Number of Visits  12    Date for PT Re-Evaluation  10/07/19    Authorization Type  FOTO; progress note every 10th visit    PT Start Time  0815    PT Stop Time  0909    PT Time Calculation (min)  54 min       Past Medical History:  Diagnosis Date  . Arthritis   . Diabetes mellitus without complication ([place])   . Gout   . History of kidney stones   . Hypertension     Past Surgical History:  Procedure Laterality Date  . CATARACT EXTRACTION W/PHACO Right 05/13/2019   Procedure: CATARACT EXTRACTION PHACO AND INTRAOCULAR LENS PLACEMENT (IOC);  Surgeon: Baruch Goldmann, MD;  Location: [facility];  Service: Ophthalmology;  Laterality: Right;  CDE: 10.31  . CATARACT EXTRACTION W/PHACO Left 05/27/2019   Procedure: CATARACT EXTRACTION PHACO AND INTRAOCULAR LENS PLACEMENT (IOC);  Surgeon: Baruch Goldmann, MD;  Location: [facility];  Service: Ophthalmology;  Laterality: Left;  CDE: 6.49  . COLONOSCOPY     times two 2002 ,2007  . COLONOSCOPY N/A 10/07/2013   Procedure: COLONOSCOPY;  Surgeon: Daneil Dolin, MD;  [facility];  Service: Endoscopy;  Laterality: N/A;  8:30 AM  . COLONOSCOPY N/A 10/05/2018   Procedure: COLONOSCOPY;  Surgeon: Daneil Dolin, MD;  [facility];  Service: Endoscopy;  Laterality: N/A;  8:30  . JOINT REPLACEMENT    . KNEE ARTHROPLASTY Left 06/27/2019   Procedure: COMPUTER ASSISTED TOTAL KNEE ARTHROPLASTY;  Surgeon: Rod Can, MD;  [facility]: [facility];  Service: Orthopedics;  Laterality: Left;  . KNEE ARTHROPLASTY Right 08/15/2019   Procedure: COMPUTER ASSISTED TOTAL KNEE  ARTHROPLASTY;  Surgeon: Rod Can, MD;  Location: [facility];  Service: Orthopedics;  Laterality: Right;  . LEG SURGERY Right early 84's  . NO PAST SURGERIES      There were no vitals filed for this visit.  Subjective Assessment - 08/23/19 0833    Subjective  COVID-19 screening performed upon arrival. Patient arrives ambulating with a straight cane with "a little bit of pain." 6/10    Pertinent History  right TKA 08/15/2019; left TKA 9/10/202; HTN, DM    How long can you sit comfortably?  1 hr    How long can you stand comfortably?  20-30 mins    How long can you walk comfortably?  room to room    Diagnostic tests  x-ray    Patient Stated Goals  decrease pain, return to normal activities.    Currently in Pain?  Yes    Pain Score  6     Pain Onset  In the past 7 days                       [facility] Adult PT Treatment/Exercise - 08/23/19 0001      Exercises   Exercises  Knee/Hip      Knee/Hip Exercises: Aerobic   Nustep  Level 5 x15 mins Seat 10 to 9      Knee/Hip Exercises: Standing   Hip Flexion  AROM;Both;Knee bent;3  sets;10 reps    Forward Lunges  Right;20 reps    Hip Abduction  --    Rocker Board  3 minutes   calf stretching     Knee/Hip Exercises: Supine   Quad Sets  AROM;Right;2 sets;10 reps    Quad Sets Limitations  5 sec hold    Short Arc Quad Sets  AROM;Right;2 sets;10 reps    Short Arc Quad Sets Limitations  5 sec hold    Straight Leg Raises  AROM;Right;1 set;10 reps      Modalities   Modalities  Vasopneumatic      Vasopneumatic   Number Minutes Vasopneumatic   15 minutes    Vasopnuematic Location   Knee    Vasopneumatic Pressure  Low    Vasopneumatic Temperature   36      Manual Therapy   Manual Therapy  Passive ROM    Passive ROM  PROM into right knee flexion to 108 degrees and extension -4 degrees               PT Short Term Goals - 08/19/19 1254      PT SHORT TERM GOAL #1   Title  Patient will be independent with initial  HEP    Time  3    Period  Weeks    Status  New      PT SHORT TERM GOAL #2   Title  Patient will demonstrate 100+ degrees of right knee flexion AROM to improve ability to perform functional tasks.    Time  2    Period  Weeks    Status  New      PT SHORT TERM GOAL #3   Title  Patient will ambulate with straight cane or no AD with minimal gait deviations to improve gait mechanics.    Time  2    Period  Weeks    Status  New        PT Long Term Goals - 08/19/19 1255      PT LONG TERM GOAL #1   Title  Patient will be independent with advanced HEP    Time  4    Period  Weeks    Status  New      PT LONG TERM GOAL #2   Title  Patient will demonstrate 115+ degrees of right knee flexion AROM to improve ability to perform functional tasks.    Time  4    Period  Weeks    Status  New      PT LONG TERM GOAL #3   Title  Patient will demonstrate 0 degrees of right knee extension AROM to improve gait mechanics.    Time  4    Period  Weeks    Status  New      PT LONG TERM GOAL #4   Title  Patient will report ability to perform ADLs and home activities with right knee pain less than or equal to 3/10.    Time  4    Period  Weeks    Status  New      PT LONG TERM GOAL #5   Title  Patient will demonstrate reciprocating stair negotiation with 1 rail and no AD to safely enter and exit home.    Time  4    Period  Weeks    Status  New            Plan - 08/23/19 0900    Clinical Impression Statement  Pt  arrived today doing fairly well with RT knee. He was able to complete all therex today with minimal pain increase. His PROM today was 4-108 degrees. Pt able to perform SLR without lag. Normal modality response today.    Comorbidities  R TKA 08/15/2019; L TKA 06/27/2019; HTN, DM    Examination-Activity Limitations  Stand;Stairs;Sit    Examination-Participation Restrictions  Driving    Stability/Clinical Decision Making  Stable/Uncomplicated    Rehab Potential  Good    PT Frequency   3x / week    PT Duration  6 weeks    PT Treatment/Interventions  ADLs/Self Care Home Management;Cryotherapy;Electrical Stimulation;Iontophoresis 4mg /ml Dexamethasone;Moist Heat;Balance training;Therapeutic exercise;Therapeutic activities;Functional mobility training;Stair training;Gait training;Neuromuscular re-education;Patient/family education;Passive range of motion;Vasopneumatic Device;Taping;Manual techniques    PT Next Visit Plan  Nustep, progress to bike when ready, AROM, PROM to right knee modalities PRN for pain relief.    PT Home Exercise Plan  see patient education section    Consulted and Agree with Plan of Care  Patient       Patient will benefit from skilled therapeutic intervention in order to improve the following deficits and impairments:  Decreased activity tolerance, Decreased strength, Decreased range of motion, Difficulty walking, Pain  Visit Diagnosis: Acute pain of right knee  Stiffness of right knee, not elsewhere classified     Problem List Patient Active Problem List   Diagnosis Date Noted  . Osteoarthritis of right knee 08/15/2019  . Osteoarthritis of left knee 06/27/2019    RAMSEUR,CHRIS, PTA 08/23/2019, 10:27 AM  [facility] [place], [place], [address] Phone: [phone]   Fax:  [phone]  Name: Bryan Atkinson MRN: [id number] Date of Birth: Jan 06, 1956

## 2019-08-26 ENCOUNTER — Encounter: Payer: Self-pay | Admitting: Physical Therapy

## 2019-08-26 ENCOUNTER — Other Ambulatory Visit: Payer: Self-pay

## 2019-08-26 ENCOUNTER — Ambulatory Visit: Payer: 59 | Admitting: Physical Therapy

## 2019-08-26 DIAGNOSIS — M25561 Pain in right knee: Secondary | ICD-10-CM

## 2019-08-26 DIAGNOSIS — M25661 Stiffness of right knee, not elsewhere classified: Secondary | ICD-10-CM

## 2019-08-26 NOTE — Therapy (Signed)
[facility] [place], [place], [address] Phone: [phone]   Fax:  [phone]  Physical Therapy Treatment  Patient Details  Name: Bryan Atkinson MRN: [id number] Date of Birth: 03-02-1956 Referring Provider (PT): Rod Can, MD   Encounter Date: 08/26/2019  PT End of Session - 08/26/19 0824    Visit Number  4    Number of Visits  12    Date for PT Re-Evaluation  10/07/19    Authorization Type  FOTO; progress note every 10th visit    PT Start Time  0817    PT Stop Time  0900    PT Time Calculation (min)  43 min    Activity Tolerance  Patient tolerated treatment well    Behavior During Therapy  [facility] for tasks assessed/performed       Past Medical History:  Diagnosis Date  . Arthritis   . Diabetes mellitus without complication ([place])   . Gout   . History of kidney stones   . Hypertension     Past Surgical History:  Procedure Laterality Date  . CATARACT EXTRACTION W/PHACO Right 05/13/2019   Procedure: CATARACT EXTRACTION PHACO AND INTRAOCULAR LENS PLACEMENT (IOC);  Surgeon: Baruch Goldmann, MD;  [facility]: [facility];  Service: Ophthalmology;  Laterality: Right;  CDE: 10.31  . CATARACT EXTRACTION W/PHACO Left 05/27/2019   Procedure: CATARACT EXTRACTION PHACO AND INTRAOCULAR LENS PLACEMENT (IOC);  Surgeon: Baruch Goldmann, MD;  [facility]: [facility];  Service: Ophthalmology;  Laterality: Left;  CDE: 6.49  . COLONOSCOPY     times two 2002 ,2007  . COLONOSCOPY N/A 10/07/2013   Procedure: COLONOSCOPY;  Surgeon: Daneil Dolin, MD;  [facility];  Service: Endoscopy;  Laterality: N/A;  8:30 AM  . COLONOSCOPY N/A 10/05/2018   Procedure: COLONOSCOPY;  Surgeon: Daneil Dolin, MD;  [facility];  Service: Endoscopy;  Laterality: N/A;  8:30  . JOINT REPLACEMENT    . KNEE ARTHROPLASTY Left 06/27/2019   Procedure: COMPUTER ASSISTED TOTAL KNEE ARTHROPLASTY;  Surgeon: Rod Can, MD;  [facility];   Service: Orthopedics;  Laterality: Left;  . KNEE ARTHROPLASTY Right 08/15/2019   Procedure: COMPUTER ASSISTED TOTAL KNEE ARTHROPLASTY;  Surgeon: Rod Can, MD;  [facility];  Service: Orthopedics;  Laterality: Right;  . LEG SURGERY Right early 28's  . NO PAST SURGERIES      There were no vitals filed for this visit.  Subjective Assessment - 08/26/19 0812    Subjective  COVID 19 screening performed on patient upon arrival. Patient reports pain is getting better but itchy around bandage.    Pertinent History  right TKA 08/15/2019; left TKA 9/10/202; HTN, DM    Limitations  Sitting;Standing;Walking;House hold activities    How long can you sit comfortably?  1 hr    How long can you stand comfortably?  20-30 mins    How long can you walk comfortably?  room to room    Diagnostic tests  x-ray    Patient Stated Goals  decrease pain, return to normal activities.    Currently in Pain?  Yes    Pain Score  5     Pain Location  Knee    Pain Orientation  Right    Pain Descriptors / Indicators  Discomfort    Pain Type  Surgical pain    Pain Onset  1 to 4 weeks ago    Pain Frequency  Intermittent         [facility]  PT Assessment - 08/26/19 0001      Assessment   Medical Diagnosis  osteoarthritis of right knee    Referring Provider (PT)  Rod Can, MD    Onset Date/Surgical Date  08/15/19    Next MD Visit  08/30/2019    Prior Therapy  yes      Precautions   Precautions  Other (comment)    Precaution Comments  no ultrasound                   [facility] Adult PT Treatment/Exercise - 08/26/19 0001      Knee/Hip Exercises: Aerobic   Nustep  L6 x10 min      Knee/Hip Exercises: Standing   Hip Flexion  AROM;Right;20 reps;Knee bent    Forward Lunges  Right;20 reps    Hip Abduction  AROM;Right;20 reps;Knee straight    Forward Step Up  Right;2 sets;10 reps;Hand Hold: 2;Step Height: 6"    Rocker Board  3 minutes      Knee/Hip Exercises: Seated   Long Arc Quad   Strengthening;Right;2 sets;10 reps;Weights    Long Arc Quad Weight  4 lbs.    Clamshell with Marga Hoots   x20 reps     Modalities   Modalities  Vasopneumatic      Vasopneumatic   Number Minutes Vasopneumatic   15 minutes    Vasopnuematic Location   Knee    Vasopneumatic Pressure  Low    Vasopneumatic Temperature   36               PT Short Term Goals - 08/19/19 1254      PT SHORT TERM GOAL #1   Title  Patient will be independent with initial HEP    Time  3    Period  Weeks    Status  New      PT SHORT TERM GOAL #2   Title  Patient will demonstrate 100+ degrees of right knee flexion AROM to improve ability to perform functional tasks.    Time  2    Period  Weeks    Status  New      PT SHORT TERM GOAL #3   Title  Patient will ambulate with straight cane or no AD with minimal gait deviations to improve gait mechanics.    Time  2    Period  Weeks    Status  New        PT Long Term Goals - 08/19/19 1255      PT LONG TERM GOAL #1   Title  Patient will be independent with advanced HEP    Time  4    Period  Weeks    Status  New      PT LONG TERM GOAL #2   Title  Patient will demonstrate 115+ degrees of right knee flexion AROM to improve ability to perform functional tasks.    Time  4    Period  Weeks    Status  New      PT LONG TERM GOAL #3   Title  Patient will demonstrate 0 degrees of right knee extension AROM to improve gait mechanics.    Time  4    Period  Weeks    Status  New      PT LONG TERM GOAL #4   Title  Patient will report ability to perform ADLs and home activities with right knee pain less than or equal to 3/10.    Time  4    Period  Weeks    Status  New      PT LONG TERM GOAL #5   Title  Patient will demonstrate reciprocating stair negotiation with 1 rail and no AD to safely enter and exit home.    Time  4    Period  Weeks    Status  New            Plan - 08/26/19 0853    Clinical Impression Statement  Patient  presented in clinic with reports of less discomfort in R knee. Compression stocking and aquacell bandage donned to RLE. Patient able to complete all exercises for light strengthening and ROM without complaint. Good quad control noted during therex. Normal vasopnuematic response noted following removal of the modalities.    Personal Factors and Comorbidities  Age;Comorbidity 2    Comorbidities  R TKA 08/15/2019; L TKA 06/27/2019; HTN, DM    Examination-Activity Limitations  Stand;Stairs;Sit    Examination-Participation Restrictions  Driving    Stability/Clinical Decision Making  Stable/Uncomplicated    Rehab Potential  Good    PT Frequency  3x / week    PT Duration  6 weeks    PT Treatment/Interventions  ADLs/Self Care Home Management;Cryotherapy;Electrical Stimulation;Iontophoresis 4mg /ml Dexamethasone;Moist Heat;Balance training;Therapeutic exercise;Therapeutic activities;Functional mobility training;Stair training;Gait training;Neuromuscular re-education;Patient/family education;Passive range of motion;Vasopneumatic Device;Taping;Manual techniques    PT Next Visit Plan  Nustep, progress to bike when ready, AROM, PROM to right knee modalities PRN for pain relief.    PT Home Exercise Plan  see patient education section    Consulted and Agree with Plan of Care  Patient       Patient will benefit from skilled therapeutic intervention in order to improve the following deficits and impairments:  Decreased activity tolerance, Decreased strength, Decreased range of motion, Difficulty walking, Pain  Visit Diagnosis: Acute pain of right knee  Stiffness of right knee, not elsewhere classified     Problem List Patient Active Problem List   Diagnosis Date Noted  . Osteoarthritis of right knee 08/15/2019  . Osteoarthritis of left knee 06/27/2019    Standley Brooking, PTA 08/26/2019, 9:03 AM  [facility] [address] [place], [place], [address] Phone:  [phone]   Fax:  [phone]  Name: Bryan Atkinson MRN: [id number] Date of Birth: January 03, 1956

## 2019-08-28 ENCOUNTER — Encounter: Payer: Self-pay | Admitting: Physical Therapy

## 2019-08-28 ENCOUNTER — Ambulatory Visit: Payer: 59 | Admitting: Physical Therapy

## 2019-08-28 ENCOUNTER — Other Ambulatory Visit: Payer: Self-pay

## 2019-08-28 DIAGNOSIS — M25561 Pain in right knee: Secondary | ICD-10-CM

## 2019-08-28 DIAGNOSIS — M25661 Stiffness of right knee, not elsewhere classified: Secondary | ICD-10-CM

## 2019-08-28 NOTE — Therapy (Signed)
[facility] [place], [place], [address] Phone: [phone]   Fax:  [phone]  Physical Therapy Treatment  Patient Details  Name: Bryan Atkinson MRN: [id number] Date of Birth: 02-09-1956 Referring Provider (PT): Rod Can, MD   Encounter Date: 08/28/2019  PT End of Session - 08/28/19 0841    Visit Number  5    Number of Visits  12    Date for PT Re-Evaluation  10/07/19    Authorization Type  FOTO; progress note every 10th visit    PT Start Time  0816    PT Stop Time  0903    PT Time Calculation (min)  47 min    Activity Tolerance  Patient tolerated treatment well    Behavior During Therapy  [facility] for tasks assessed/performed       Past Medical History:  Diagnosis Date  . Arthritis   . Diabetes mellitus without complication ([place])   . Gout   . History of kidney stones   . Hypertension     Past Surgical History:  Procedure Laterality Date  . CATARACT EXTRACTION W/PHACO Right 05/13/2019   Procedure: CATARACT EXTRACTION PHACO AND INTRAOCULAR LENS PLACEMENT (IOC);  Surgeon: Baruch Goldmann, MD;  [facility]: [facility];  Service: Ophthalmology;  Laterality: Right;  CDE: 10.31  . CATARACT EXTRACTION W/PHACO Left 05/27/2019   Procedure: CATARACT EXTRACTION PHACO AND INTRAOCULAR LENS PLACEMENT (IOC);  Surgeon: Baruch Goldmann, MD;  Location: [facility];  Service: Ophthalmology;  Laterality: Left;  CDE: 6.49  . COLONOSCOPY     times two 2002 ,2007  . COLONOSCOPY N/A 10/07/2013   Procedure: COLONOSCOPY;  Surgeon: Daneil Dolin, MD;  [facility];  Service: Endoscopy;  Laterality: N/A;  8:30 AM  . COLONOSCOPY N/A 10/05/2018   Procedure: COLONOSCOPY;  Surgeon: Daneil Dolin, MD;  [facility];  Service: Endoscopy;  Laterality: N/A;  8:30  . JOINT REPLACEMENT    . KNEE ARTHROPLASTY Left 06/27/2019   Procedure: COMPUTER ASSISTED TOTAL KNEE ARTHROPLASTY;  Surgeon: Rod Can, MD;  [facility];   Service: Orthopedics;  Laterality: Left;  . KNEE ARTHROPLASTY Right 08/15/2019   Procedure: COMPUTER ASSISTED TOTAL KNEE ARTHROPLASTY;  Surgeon: Rod Can, MD;  [facility];  Service: Orthopedics;  Laterality: Right;  . LEG SURGERY Right early 22's  . NO PAST SURGERIES      There were no vitals filed for this visit.  Subjective Assessment - 08/28/19 0841    Subjective  COVID 19 screening performed on patient upon arrival. Patient arrives stating 5/10 pain and may be due to the weather.    Pertinent History  right TKA 08/15/2019; left TKA 9/10/202; HTN, DM    Limitations  Sitting;Standing;Walking;House hold activities    How long can you sit comfortably?  1 hr    How long can you stand comfortably?  20-30 mins    How long can you walk comfortably?  room to room    Diagnostic tests  x-ray    Patient Stated Goals  decrease pain, return to normal activities.    Currently in Pain?  Yes    Pain Score  5     Pain Location  Knee    Pain Orientation  Right    Pain Descriptors / Indicators  Discomfort    Pain Type  Surgical pain    Pain Onset  1 to 4 weeks ago    Pain Frequency  Intermittent  [facility] PT Assessment - 08/28/19 0001      Assessment   Medical Diagnosis  osteoarthritis of right knee    Referring Provider (PT)  Rod Can, MD    Onset Date/Surgical Date  08/15/19    Next MD Visit  08/30/2019    Prior Therapy  yes      Precautions   Precautions  Other (comment)    Precaution Comments  no ultrasound                   [facility] Adult PT Treatment/Exercise - 08/28/19 0001      Exercises   Exercises  Knee/Hip      Knee/Hip Exercises: Aerobic   Stationary Bike  Level 3 x 5 minutes, seat 8    Nustep  L5 x10 min      Knee/Hip Exercises: Standing   Heel Raises  Both;2 sets;10 reps    Terminal Knee Extension  Strengthening;Right;2 sets;10 reps    Terminal Knee Extension Limitations  Green XTS    Forward Step Up  Right;2 sets;10 reps;Hand  Hold: 2;Step Height: 6"    Step Down  Right;2 sets;10 reps;Hand Hold: 2;Step Height: 4"    Functional Squat  2 sets;10 reps    SLS  3x30" with 1 UE support      Modalities   Modalities  Vasopneumatic      Vasopneumatic   Number Minutes Vasopneumatic   10 minutes    Vasopnuematic Location   Knee    Vasopneumatic Pressure  Low    Vasopneumatic Temperature   34               PT Short Term Goals - 08/19/19 1254      PT SHORT TERM GOAL #1   Title  Patient will be independent with initial HEP    Time  3    Period  Weeks    Status  New      PT SHORT TERM GOAL #2   Title  Patient will demonstrate 100+ degrees of right knee flexion AROM to improve ability to perform functional tasks.    Time  2    Period  Weeks    Status  New      PT SHORT TERM GOAL #3   Title  Patient will ambulate with straight cane or no AD with minimal gait deviations to improve gait mechanics.    Time  2    Period  Weeks    Status  New        PT Long Term Goals - 08/19/19 1255      PT LONG TERM GOAL #1   Title  Patient will be independent with advanced HEP    Time  4    Period  Weeks    Status  New      PT LONG TERM GOAL #2   Title  Patient will demonstrate 115+ degrees of right knee flexion AROM to improve ability to perform functional tasks.    Time  4    Period  Weeks    Status  New      PT LONG TERM GOAL #3   Title  Patient will demonstrate 0 degrees of right knee extension AROM to improve gait mechanics.    Time  4    Period  Weeks    Status  New      PT LONG TERM GOAL #4   Title  Patient will report ability to perform ADLs and home activities  with right knee pain less than or equal to 3/10.    Time  4    Period  Weeks    Status  New      PT LONG TERM GOAL #5   Title  Patient will demonstrate reciprocating stair negotiation with 1 rail and no AD to safely enter and exit home.    Time  4    Period  Weeks    Status  New            Plan - 08/28/19 [id number]    Clinical  Impression Statement  Patient responded well to therapy session with slight increase of discomfort in the right knee. Patient required intermittent cuing for form and to maintain equal weight shifting for functional squats. Patient was able to demonstrate with improved form after cuing. Normal response to modalities upon removal of modalities.    Personal Factors and Comorbidities  Age;Comorbidity 2    Comorbidities  R TKA 08/15/2019; L TKA 06/27/2019; HTN, DM    Examination-Activity Limitations  Stand;Stairs;Sit    Examination-Participation Restrictions  Driving    Stability/Clinical Decision Making  Stable/Uncomplicated    Clinical Decision Making  Low    Rehab Potential  Good    PT Frequency  3x / week    PT Duration  6 weeks    PT Treatment/Interventions  ADLs/Self Care Home Management;Cryotherapy;Electrical Stimulation;Iontophoresis 4mg /ml Dexamethasone;Moist Heat;Balance training;Therapeutic exercise;Therapeutic activities;Functional mobility training;Stair training;Gait training;Neuromuscular re-education;Patient/family education;Passive range of motion;Vasopneumatic Device;Taping;Manual techniques    PT Next Visit Plan  MD note for follow up visit; Nustep, progress to bike when ready, AROM, PROM to right knee modalities PRN for pain relief.    PT Home Exercise Plan  see patient education section    Consulted and Agree with Plan of Care  Patient       Patient will benefit from skilled therapeutic intervention in order to improve the following deficits and impairments:  Decreased activity tolerance, Decreased strength, Decreased range of motion, Difficulty walking, Pain  Visit Diagnosis: Acute pain of right knee  Stiffness of right knee, not elsewhere classified     Problem List Patient Active Problem List   Diagnosis Date Noted  . Osteoarthritis of right knee 08/15/2019  . Osteoarthritis of left knee 06/27/2019    Gabriela Eves, PT, DPT 08/28/2019, 9:08 AM  [facility] [place], [place], [address] Phone: [phone]   Fax:  [phone]  Name: Anael Yildiz MRN: [id number] Date of Birth: 14-Dec-1955

## 2019-08-30 ENCOUNTER — Encounter: Payer: Self-pay | Admitting: Physical Therapy

## 2019-08-30 ENCOUNTER — Ambulatory Visit: Payer: 59 | Admitting: Physical Therapy

## 2019-08-30 ENCOUNTER — Other Ambulatory Visit: Payer: Self-pay

## 2019-08-30 DIAGNOSIS — M25661 Stiffness of right knee, not elsewhere classified: Secondary | ICD-10-CM

## 2019-08-30 DIAGNOSIS — M25561 Pain in right knee: Secondary | ICD-10-CM | POA: Diagnosis not present

## 2019-08-30 NOTE — Therapy (Signed)
[facility] [place], [place], [address] Phone: [phone]   Fax:  [phone]  Physical Therapy Treatment  Patient Details  Name: Bryan Atkinson MRN: [id number] Date of Birth: [birth year] Referring Provider (PT): Rod Can, MD   Encounter Date: 08/30/2019  PT End of Session - 08/30/19 0833    Visit Number  6    Number of Visits  12    Date for PT Re-Evaluation  10/07/19    Authorization Type  FOTO; progress note every 10th visit    PT Start Time  0816    PT Stop Time  0905    PT Time Calculation (min)  49 min    Activity Tolerance  Patient tolerated treatment well    Behavior During Therapy  [facility] for tasks assessed/performed       Past Medical History:  Diagnosis Date  . Arthritis   . Diabetes mellitus without complication ([place])   . Gout   . History of kidney stones   . Hypertension     Past Surgical History:  Procedure Laterality Date  . CATARACT EXTRACTION W/PHACO Right 05/13/2019   Procedure: CATARACT EXTRACTION PHACO AND INTRAOCULAR LENS PLACEMENT (IOC);  Surgeon: Baruch Goldmann, MD;  [facility];  Service: Ophthalmology;  Laterality: Right;  CDE: 10.31  . CATARACT EXTRACTION W/PHACO Left 05/27/2019   Procedure: CATARACT EXTRACTION PHACO AND INTRAOCULAR LENS PLACEMENT (IOC);  Surgeon: Baruch Goldmann, MD;  Location: [facility];  Service: Ophthalmology;  Laterality: Left;  CDE: 6.49  . COLONOSCOPY     times two 2002 ,2007  . COLONOSCOPY N/A 10/07/2013   Procedure: COLONOSCOPY;  Surgeon: Daneil Dolin, MD;  [facility];  Service: Endoscopy;  Laterality: N/A;  8:30 AM  . COLONOSCOPY N/A 10/05/2018   Procedure: COLONOSCOPY;  Surgeon: Daneil Dolin, MD;  [facility];  Service: Endoscopy;  Laterality: N/A;  8:30  . JOINT REPLACEMENT    . KNEE ARTHROPLASTY Left 06/27/2019   Procedure: COMPUTER ASSISTED TOTAL KNEE ARTHROPLASTY;  Surgeon: Rod Can, MD;  [facility];   Service: Orthopedics;  Laterality: Left;  . KNEE ARTHROPLASTY Right 08/15/2019   Procedure: COMPUTER ASSISTED TOTAL KNEE ARTHROPLASTY;  Surgeon: Rod Can, MD;  [facility];  Service: Orthopedics;  Laterality: Right;  . LEG SURGERY Right early 39's  . NO PAST SURGERIES      There were no vitals filed for this visit.  Subjective Assessment - 08/30/19 0813    Subjective  COVID 19 screening performed on patient upon arrival. Patient reports a sore.ache this morning.    Pertinent History  right TKA 08/15/2019; left TKA 9/10/202; HTN, DM    Limitations  Sitting;Standing;Walking;House hold activities    How long can you sit comfortably?  1 hr    How long can you stand comfortably?  20-30 mins    How long can you walk comfortably?  room to room    Diagnostic tests  x-ray    Patient Stated Goals  decrease pain, return to normal activities.    Currently in Pain?  Yes    Pain Score  6     Pain Location  Knee    Pain Orientation  Right    Pain Descriptors / Indicators  Sore;Aching    Pain Type  Surgical pain    Pain Onset  1 to 4 weeks ago    Pain Frequency  Intermittent         [facility] PT Assessment - 08/30/19  0001      Assessment   Medical Diagnosis  osteoarthritis of right knee    Referring Provider (PT)  Rod Can, MD    Onset Date/Surgical Date  08/15/19    Next MD Visit  08/30/2019    Prior Therapy  yes      Precautions   Precautions  Other (comment)    Precaution Comments  no ultrasound      ROM / Strength   AROM / PROM / Strength  AROM      AROM   Overall AROM   Within functional limits for tasks performed    AROM Assessment Site  Knee    Right/Left Knee  Right    Right Knee Extension  0    Right Knee Flexion  118                   [facility] Adult PT Treatment/Exercise - 08/30/19 0001      Knee/Hip Exercises: Aerobic   Stationary Bike  L3, seat 7 x5 min    Nustep  L5, seat 9 x15 min      Knee/Hip Exercises: Machines for Strengthening    Cybex Knee Extension  10# 2x10 reps    Cybex Knee Flexion  40# 3x10 reps    Cybex Leg Press  1 pl, seat 7 x20 reps      Knee/Hip Exercises: Seated   Sit to Sand  20 reps;without UE support      Modalities   Modalities  Vasopneumatic      Vasopneumatic   Number Minutes Vasopneumatic   15 minutes    Vasopnuematic Location   Knee    Vasopneumatic Pressure  Medium    Vasopneumatic Temperature   34               PT Short Term Goals - 08/30/19 0843      PT SHORT TERM GOAL #1   Title  Patient will be independent with initial HEP    Time  3    Period  Weeks    Status  On-going      PT SHORT TERM GOAL #2   Title  Patient will demonstrate 100+ degrees of right knee flexion AROM to improve ability to perform functional tasks.    Time  2    Period  Weeks    Status  Achieved      PT SHORT TERM GOAL #3   Title  Patient will ambulate with straight cane or no AD with minimal gait deviations to improve gait mechanics.    Time  2    Period  Weeks    Status  Achieved        PT Long Term Goals - 08/30/19 [id number]      PT LONG TERM GOAL #1   Title  Patient will be independent with advanced HEP    Time  4    Period  Weeks    Status  On-going      PT LONG TERM GOAL #2   Title  Patient will demonstrate 115+ degrees of right knee flexion AROM to improve ability to perform functional tasks.    Time  4    Period  Weeks    Status  Achieved      PT LONG TERM GOAL #3   Title  Patient will demonstrate 0 degrees of right knee extension AROM to improve gait mechanics.    Time  4    Period  Weeks  Status  Achieved      PT LONG TERM GOAL #4   Title  Patient will report ability to perform ADLs and home activities with right knee pain less than or equal to 3/10.    Time  4    Period  Weeks    Status  On-going      PT LONG TERM GOAL #5   Title  Patient will demonstrate reciprocating stair negotiation with 1 rail and no AD to safely enter and exit home.    Time  4    Period   Weeks    Status  On-going            Plan - 08/30/19 [id number]    Clinical Impression Statement  Patient presented in clinic with reports of 6/10 R knee pain. Patient has progressed very well after R TKR 2 weeks ago. Patient able to complete full revolutions on stationary bike and able to progress to machine strengthening today. Equal WB noted during sit > stands. AROM of R knee measured as 0-118 deg. Minimal to moderate edema present surrounding R patella. Normal vasopnuematic response onted followng removal of the modality.    Personal Factors and Comorbidities  Age;Comorbidity 2    Comorbidities  R TKA 08/15/2019; L TKA 06/27/2019; HTN, DM    Examination-Activity Limitations  Stand;Stairs;Sit    Examination-Participation Restrictions  Driving    Stability/Clinical Decision Making  Stable/Uncomplicated    Rehab Potential  Good    PT Frequency  3x / week    PT Duration  6 weeks    PT Treatment/Interventions  ADLs/Self Care Home Management;Cryotherapy;Electrical Stimulation;Iontophoresis 4mg /ml Dexamethasone;Moist Heat;Balance training;Therapeutic exercise;Therapeutic activities;Functional mobility training;Stair training;Gait training;Neuromuscular re-education;Patient/family education;Passive range of motion;Vasopneumatic Device;Taping;Manual techniques    PT Next Visit Plan  Continue per MD discretion.    PT Home Exercise Plan  see patient education section    Consulted and Agree with Plan of Care  Patient       Patient will benefit from skilled therapeutic intervention in order to improve the following deficits and impairments:  Decreased activity tolerance, Decreased strength, Decreased range of motion, Difficulty walking, Pain  Visit Diagnosis: Acute pain of right knee  Stiffness of right knee, not elsewhere classified     Problem List Patient Active Problem List   Diagnosis Date Noted  . Osteoarthritis of right knee 08/15/2019  . Osteoarthritis of left knee 06/27/2019     Standley Brooking, PTA 08/30/2019, 9:34 AM  [facility] [address] [place], [place], [address] Phone: [phone]   Fax:  [phone]  Name: Bryan Atkinson MRN: [id number] Date of Birth: [birth year]

## 2019-09-02 ENCOUNTER — Other Ambulatory Visit: Payer: Self-pay

## 2019-09-02 ENCOUNTER — Encounter: Payer: Self-pay | Admitting: Physical Therapy

## 2019-09-02 ENCOUNTER — Ambulatory Visit: Payer: 59 | Admitting: Physical Therapy

## 2019-09-02 DIAGNOSIS — M25561 Pain in right knee: Secondary | ICD-10-CM

## 2019-09-02 DIAGNOSIS — M25661 Stiffness of right knee, not elsewhere classified: Secondary | ICD-10-CM

## 2019-09-02 NOTE — Therapy (Signed)
[facility] [place], [place], [address] Phone: [phone]   Fax:  [phone]  Physical Therapy Treatment  Patient Details  Name: Bryan Atkinson MRN: [id number] Date of Birth: 1955/12/07 Referring Provider (PT): Rod Can, MD   Encounter Date: 09/02/2019  PT End of Session - 09/02/19 0849    Visit Number  7    Number of Visits  12    Date for PT Re-Evaluation  10/07/19    Authorization Type  FOTO; progress note every 10th visit    PT Start Time  0815    PT Stop Time  0904    PT Time Calculation (min)  49 min    Activity Tolerance  Patient tolerated treatment well    Behavior During Therapy  [facility] for tasks assessed/performed       Past Medical History:  Diagnosis Date  . Arthritis   . Diabetes mellitus without complication ([place])   . Gout   . History of kidney stones   . Hypertension     Past Surgical History:  Procedure Laterality Date  . CATARACT EXTRACTION W/PHACO Right 05/13/2019   Procedure: CATARACT EXTRACTION PHACO AND INTRAOCULAR LENS PLACEMENT (IOC);  Surgeon: Baruch Goldmann, MD;  [facility];  Service: Ophthalmology;  Laterality: Right;  CDE: 10.31  . CATARACT EXTRACTION W/PHACO Left 05/27/2019   Procedure: CATARACT EXTRACTION PHACO AND INTRAOCULAR LENS PLACEMENT (IOC);  Surgeon: Baruch Goldmann, MD;  [facility]: [facility];  Service: Ophthalmology;  Laterality: Left;  CDE: 6.49  . COLONOSCOPY     times two 2002 ,2007  . COLONOSCOPY N/A 10/07/2013   Procedure: COLONOSCOPY;  Surgeon: Daneil Dolin, MD;  [facility];  Service: Endoscopy;  Laterality: N/A;  8:30 AM  . COLONOSCOPY N/A 10/05/2018   Procedure: COLONOSCOPY;  Surgeon: Daneil Dolin, MD;  [facility];  Service: Endoscopy;  Laterality: N/A;  8:30  . JOINT REPLACEMENT    . KNEE ARTHROPLASTY Left 06/27/2019   Procedure: COMPUTER ASSISTED TOTAL KNEE ARTHROPLASTY;  Surgeon: Rod Can, MD;  [facility];   Service: Orthopedics;  Laterality: Left;  . KNEE ARTHROPLASTY Right 08/15/2019   Procedure: COMPUTER ASSISTED TOTAL KNEE ARTHROPLASTY;  Surgeon: Rod Can, MD;  [facility];  Service: Orthopedics;  Laterality: Right;  . LEG SURGERY Right early 97's  . NO PAST SURGERIES      There were no vitals filed for this visit.  Subjective Assessment - 09/02/19 0824    Subjective  COVID 19 screening performed on patient upon arrival. Patient reports a little ongoing sorenes    Pertinent History  right TKA 08/15/2019; left TKA 9/10/202; HTN, DM    Limitations  Sitting;Standing;Walking;House hold activities    How long can you sit comfortably?  1 hr    How long can you stand comfortably?  20-30 mins    How long can you walk comfortably?  room to room    Diagnostic tests  x-ray    Patient Stated Goals  decrease pain, return to normal activities.    Currently in Pain?  Yes    Pain Score  2     Pain Location  Knee    Pain Orientation  Right    Pain Descriptors / Indicators  Sore    Pain Type  Surgical pain    Pain Onset  1 to 4 weeks ago    Pain Frequency  Intermittent    Aggravating Factors   proling sitting    Pain  Relieving Factors  rest and medication                       [facility] Adult PT Treatment/Exercise - 09/02/19 0001      Knee/Hip Exercises: Aerobic   Stationary Bike  L3-4 x43mn      Knee/Hip Exercises: Machines for Strengthening   Cybex Knee Extension  10# 3x10 reps    Cybex Knee Flexion  40# 3x10 reps    Cybex Leg Press  1 pl, seat 7 2x15 reps      Knee/Hip Exercises: Standing   Forward Step Up  Right;2 sets;10 reps;Hand Hold: 2;Step Height: 6"    Step Down  Right;2 sets;10 reps;Hand Hold: 2;Step Height: 6"      Knee/Hip Exercises: Supine   Bridges with Clamshell  Strengthening;Both;20 reps      EAcupuncturistLocation  Rt knee    Electrical Stimulation Action  IFC    Electrical Stimulation Parameters  1-10hx x  160m      Vasopneumatic   Number Minutes Vasopneumatic   15 minutes    Vasopnuematic Location   Knee   Right   Vasopneumatic Pressure  Medium    Vasopneumatic Temperature   34               PT Short Term Goals - 09/02/19 0827      PT SHORT TERM GOAL #1   Title  Patient will be independent with initial HEP    Time  3    Period  Weeks    Status  Achieved   09/02/19     PT SHORT TERM GOAL #2   Title  Patient will demonstrate 100+ degrees of right knee flexion AROM to improve ability to perform functional tasks.    Time  2    Period  Weeks    Status  Achieved      PT SHORT TERM GOAL #3   Title  Patient will ambulate with straight cane or no AD with minimal gait deviations to improve gait mechanics.    Time  2    Period  Weeks    Status  Achieved        PT Long Term Goals - 09/02/19 081610    PT LONG TERM GOAL #1   Title  Patient will be independent with advanced HEP    Time  4    Period  Weeks    Status  On-going      PT LONG TERM GOAL #2   Title  Patient will demonstrate 115+ degrees of right knee flexion AROM to improve ability to perform functional tasks.    Time  4    Period  Weeks    Status  Achieved      PT LONG TERM GOAL #3   Title  Patient will demonstrate 0 degrees of right knee extension AROM to improve gait mechanics.    Time  4    Period  Weeks    Status  Achieved      PT LONG TERM GOAL #4   Title  Patient will report ability to perform ADLs and home activities with right knee pain less than or equal to 3/10.    Time  4    Period  Weeks    Status  On-going   about 3-4/10 per reported 09/02/19     PT LONG TERM GOAL #5   Title  Patient will demonstrate reciprocating  stair negotiation with 1 rail and no AD to safely enter and exit home.    Time  4    Status  On-going            Plan - 09/02/19 0851    Clinical Impression Statement  Patient tolerated treatment well today and able to progress activity tolerance with no increased  pain. Patient went to MD and got all bandages removed and progressing well per reported.  Patient able to perform ADL's with greater ease yet pain level ranges at 1-6/60 with certain activity. Patient doing well with stairs yet difficulty with decending at this time. Patient doing initial HEP with no difficulty. STG 31 met with others ongoing.    Personal Factors and Comorbidities  Age;Comorbidity 2    Comorbidities  R TKA 08/15/2019; L TKA 06/27/2019; HTN, DM    Examination-Activity Limitations  Stand;Stairs;Sit    Examination-Participation Restrictions  Driving    Stability/Clinical Decision Making  Stable/Uncomplicated    Rehab Potential  Good    PT Frequency  3x / week    PT Duration  6 weeks    PT Treatment/Interventions  ADLs/Self Care Home Management;Cryotherapy;Electrical Stimulation;Iontophoresis 33m/ml Dexamethasone;Moist Heat;Balance training;Therapeutic exercise;Therapeutic activities;Functional mobility training;Stair training;Gait training;Neuromuscular re-education;Patient/family education;Passive range of motion;Vasopneumatic Device;Taping;Manual techniques    PT Next Visit Plan  Cont with strengthening and modalities PRN    Consulted and Agree with Plan of Care  Patient       Patient will benefit from skilled therapeutic intervention in order to improve the following deficits and impairments:  Decreased activity tolerance, Decreased strength, Decreased range of motion, Difficulty walking, Pain  Visit Diagnosis: Acute pain of right knee  Stiffness of right knee, not elsewhere classified     Problem List Patient Active Problem List   Diagnosis Date Noted  . Osteoarthritis of right knee 08/15/2019  . Osteoarthritis of left knee 06/27/2019    Stpehen Petitjean P, PTA 09/02/2019, 9:09 AM  [facility] [address]Phone: [phone]  Fax:  [phone] Name: JBrayten KomarMRN:  [id number] of Birth: [birth year]

## 2019-09-04 ENCOUNTER — Other Ambulatory Visit: Payer: Self-pay

## 2019-09-04 ENCOUNTER — Ambulatory Visit: Payer: 59 | Admitting: Physical Therapy

## 2019-09-04 ENCOUNTER — Encounter: Payer: Self-pay | Admitting: Physical Therapy

## 2019-09-04 DIAGNOSIS — M25561 Pain in right knee: Secondary | ICD-10-CM

## 2019-09-04 DIAGNOSIS — M25661 Stiffness of right knee, not elsewhere classified: Secondary | ICD-10-CM

## 2019-09-04 NOTE — Therapy (Signed)
[facility] [place], [place], [address] Phone: [phone]   Fax:  [phone]  Physical Therapy Treatment  Patient Details  Name: Bryan Atkinson MRN: [id number] Date of Birth: 22-Jul-1956 Referring Provider (PT): Rod Can, MD   Encounter Date: 09/04/2019  PT End of Session - 09/04/19 0930    Visit Number  8    Number of Visits  12    Date for PT Re-Evaluation  10/07/19    Authorization Type  FOTO; progress note every 10th visit    PT Start Time  0900    PT Stop Time  0955    PT Time Calculation (min)  55 min    Activity Tolerance  Patient tolerated treatment well    Behavior During Therapy  [facility] for tasks assessed/performed       Past Medical History:  Diagnosis Date  . Arthritis   . Diabetes mellitus without complication ([place])   . Gout   . History of kidney stones   . Hypertension     Past Surgical History:  Procedure Laterality Date  . CATARACT EXTRACTION W/PHACO Right 05/13/2019   Procedure: CATARACT EXTRACTION PHACO AND INTRAOCULAR LENS PLACEMENT (IOC);  Surgeon: Baruch Goldmann, MD;  [facility];  Service: Ophthalmology;  Laterality: Right;  CDE: 10.31  . CATARACT EXTRACTION W/PHACO Left 05/27/2019   Procedure: CATARACT EXTRACTION PHACO AND INTRAOCULAR LENS PLACEMENT (IOC);  Surgeon: Baruch Goldmann, MD;  [facility]: [facility];  Service: Ophthalmology;  Laterality: Left;  CDE: 6.49  . COLONOSCOPY     times two 2002 ,2007  . COLONOSCOPY N/A 10/07/2013   Procedure: COLONOSCOPY;  Surgeon: Daneil Dolin, MD;  [facility];  Service: Endoscopy;  Laterality: N/A;  8:30 AM  . COLONOSCOPY N/A 10/05/2018   Procedure: COLONOSCOPY;  Surgeon: Daneil Dolin, MD;  [facility];  Service: Endoscopy;  Laterality: N/A;  8:30  . JOINT REPLACEMENT    . KNEE ARTHROPLASTY Left 06/27/2019   Procedure: COMPUTER ASSISTED TOTAL KNEE ARTHROPLASTY;  Surgeon: Rod Can, MD;  [facility];   Service: Orthopedics;  Laterality: Left;  . KNEE ARTHROPLASTY Right 08/15/2019   Procedure: COMPUTER ASSISTED TOTAL KNEE ARTHROPLASTY;  Surgeon: Rod Can, MD;  [facility];  Service: Orthopedics;  Laterality: Right;  . LEG SURGERY Right early 64's  . NO PAST SURGERIES      There were no vitals filed for this visit.  Subjective Assessment - 09/04/19 0918    Subjective  COVID 19 screening performed on patient upon arrival. Patient reports a little improvement today    Pertinent History  right TKA 08/15/2019; left TKA 9/10/202; HTN, DM    Limitations  Sitting;Standing;Walking;House hold activities    How long can you sit comfortably?  1 hr    How long can you stand comfortably?  20-30 mins    How long can you walk comfortably?  room to room    Diagnostic tests  x-ray    Patient Stated Goals  decrease pain, return to normal activities.    Currently in Pain?  Yes    Pain Score  4    pain ranges 2-4/10   Pain Location  Knee    Pain Orientation  Right    Pain Descriptors / Indicators  Sore    Pain Onset  1 to 4 weeks ago    Pain Frequency  Intermittent    Aggravating Factors   prolong sitting causes stiffness    Pain Relieving Factors  rest and meds                       [facility] Adult PT Treatment/Exercise - 09/04/19 0001      Knee/Hip Exercises: Aerobic   Stationary Bike  L4 x53min      Knee/Hip Exercises: Machines for Strengthening   Cybex Knee Extension  20# 3x10    Cybex Knee Flexion  40# 3x10 reps    Cybex Leg Press  1  1/2 pl, seat 7 2x15 reps      Knee/Hip Exercises: Standing   Lateral Step Up  Right;2 sets;10 reps;Hand Hold: 0;Step Height: 6"    Forward Step Up  Right;2 sets;10 reps;Step Height: 6";Hand Hold: 0    Step Down  Right;2 sets;10 reps;Step Height: 6";Hand Hold: 0      Knee/Hip Exercises: Supine   Bridges with Clamshell  Strengthening;Both;20 reps   red t-band     Acupuncturist Location  Rt knee     Electrical Stimulation Action  IFC    Electrical Stimulation Parameters  1-10hz  x34min      Vasopneumatic   Number Minutes Vasopneumatic   15 minutes    Vasopnuematic Location   Knee    Vasopneumatic Pressure  Medium    Vasopneumatic Temperature   34               PT Short Term Goals - 09/02/19 0827      PT SHORT TERM GOAL #1   Title  Patient will be independent with initial HEP    Time  3    Period  Weeks    Status  Achieved   09/02/19     PT SHORT TERM GOAL #2   Title  Patient will demonstrate 100+ degrees of right knee flexion AROM to improve ability to perform functional tasks.    Time  2    Period  Weeks    Status  Achieved      PT SHORT TERM GOAL #3   Title  Patient will ambulate with straight cane or no AD with minimal gait deviations to improve gait mechanics.    Time  2    Period  Weeks    Status  Achieved        PT Long Term Goals - 09/02/19 [id number]      PT LONG TERM GOAL #1   Title  Patient will be independent with advanced HEP    Time  4    Period  Weeks    Status  On-going      PT LONG TERM GOAL #2   Title  Patient will demonstrate 115+ degrees of right knee flexion AROM to improve ability to perform functional tasks.    Time  4    Period  Weeks    Status  Achieved      PT LONG TERM GOAL #3   Title  Patient will demonstrate 0 degrees of right knee extension AROM to improve gait mechanics.    Time  4    Period  Weeks    Status  Achieved      PT LONG TERM GOAL #4   Title  Patient will report ability to perform ADLs and home activities with right knee pain less than or equal to 3/10.    Time  4    Period  Weeks    Status  On-going   about 3-4/10 per reported 09/02/19     PT  LONG TERM GOAL #5   Title  Patient will demonstrate reciprocating stair negotiation with 1 rail and no AD to safely enter and exit home.    Time  4    Status  On-going            Plan - 09/04/19 [id number]    Clinical Impression Statement  Patient tolerated  treatment well today. Patient progressing with all exercises today. Patient able to progress with reps and resistance for right knee with no increased discomfort. Patient reported less discomfort overall and pain ranges from 2-4/10. patient goals are progressing this week.    Personal Factors and Comorbidities  Age;Comorbidity 2    Comorbidities  R TKA 08/15/2019; L TKA 06/27/2019; HTN, DM    Examination-Activity Limitations  Stand;Stairs;Sit    Examination-Participation Restrictions  Driving    Stability/Clinical Decision Making  Stable/Uncomplicated    Rehab Potential  Good    PT Frequency  3x / week    PT Duration  6 weeks    PT Treatment/Interventions  ADLs/Self Care Home Management;Cryotherapy;Electrical Stimulation;Iontophoresis 4mg /ml Dexamethasone;Moist Heat;Balance training;Therapeutic exercise;Therapeutic activities;Functional mobility training;Stair training;Gait training;Neuromuscular re-education;Patient/family education;Passive range of motion;Vasopneumatic Device;Taping;Manual techniques    PT Next Visit Plan  Cont with strengthening and modalities PRN    Consulted and Agree with Plan of Care  Patient       Patient will benefit from skilled therapeutic intervention in order to improve the following deficits and impairments:  Decreased activity tolerance, Decreased strength, Decreased range of motion, Difficulty walking, Pain  Visit Diagnosis: Acute pain of right knee  Stiffness of right knee, not elsewhere classified     Problem List Patient Active Problem List   Diagnosis Date Noted  . Osteoarthritis of right knee 08/15/2019  . Osteoarthritis of left knee 06/27/2019    Jackie Littlejohn P, PTA 09/04/2019, 10:00 AM  [facility] [place], [place], [address] Phone: [phone]   Fax:  [phone]  Name: Joni Mitzner MRN: [id number] Date of Birth: 1956-07-24

## 2019-09-06 ENCOUNTER — Encounter: Payer: Self-pay | Admitting: *Deleted

## 2019-09-06 ENCOUNTER — Ambulatory Visit: Payer: 59 | Admitting: *Deleted

## 2019-09-06 ENCOUNTER — Other Ambulatory Visit: Payer: Self-pay

## 2019-09-06 DIAGNOSIS — M25561 Pain in right knee: Secondary | ICD-10-CM | POA: Diagnosis not present

## 2019-09-06 DIAGNOSIS — M25661 Stiffness of right knee, not elsewhere classified: Secondary | ICD-10-CM

## 2019-09-06 NOTE — Therapy (Signed)
[facility] [place], [place], [address] Phone: [phone]   Fax:  [phone]  Physical Therapy Treatment  Patient Details  Name: Bryan Atkinson MRN: [id number] Date of Birth: October 28, 1955 Referring Provider (PT): Rod Can, MD   Encounter Date: 09/06/2019  PT End of Session - 09/06/19 0824    Visit Number  9    Number of Visits  12    Date for PT Re-Evaluation  10/07/19    Authorization Type  FOTO; progress note every 10th visit    PT Start Time  0815    PT Stop Time  0908    PT Time Calculation (min)  53 min       Past Medical History:  Diagnosis Date  . Arthritis   . Diabetes mellitus without complication ([place])   . Gout   . History of kidney stones   . Hypertension     Past Surgical History:  Procedure Laterality Date  . CATARACT EXTRACTION W/PHACO Right 05/13/2019   Procedure: CATARACT EXTRACTION PHACO AND INTRAOCULAR LENS PLACEMENT (IOC);  Surgeon: Baruch Goldmann, MD;  [facility]: [facility];  Service: Ophthalmology;  Laterality: Right;  CDE: 10.31  . CATARACT EXTRACTION W/PHACO Left 05/27/2019   Procedure: CATARACT EXTRACTION PHACO AND INTRAOCULAR LENS PLACEMENT (IOC);  Surgeon: Baruch Goldmann, MD;  Location: [facility];  Service: Ophthalmology;  Laterality: Left;  CDE: 6.49  . COLONOSCOPY     times two 2002 ,2007  . COLONOSCOPY N/A 10/07/2013   Procedure: COLONOSCOPY;  Surgeon: Daneil Dolin, MD;  [facility];  Service: Endoscopy;  Laterality: N/A;  8:30 AM  . COLONOSCOPY N/A 10/05/2018   Procedure: COLONOSCOPY;  Surgeon: Daneil Dolin, MD;  [facility];  Service: Endoscopy;  Laterality: N/A;  8:30  . JOINT REPLACEMENT    . KNEE ARTHROPLASTY Left 06/27/2019   Procedure: COMPUTER ASSISTED TOTAL KNEE ARTHROPLASTY;  Surgeon: Rod Can, MD;  [facility];  Service: Orthopedics;  Laterality: Left;  . KNEE ARTHROPLASTY Right 08/15/2019   Procedure: COMPUTER ASSISTED TOTAL  KNEE ARTHROPLASTY;  Surgeon: Rod Can, MD;  [facility];  Service: Orthopedics;  Laterality: Right;  . LEG SURGERY Right early 33's  . NO PAST SURGERIES      There were no vitals filed for this visit.  Subjective Assessment - 09/06/19 0823    Subjective  COVID 19 screening performed on patient upon arrival. Patient reports RT knee 4/10 pain, but doing well    Pertinent History  right TKA 08/15/2019; left TKA 9/10/202; HTN, DM    How long can you sit comfortably?  1 hr    How long can you stand comfortably?  20-30 mins    How long can you walk comfortably?  room to room    Diagnostic tests  x-ray    Patient Stated Goals  decrease pain, return to normal activities.    Currently in Pain?  Yes    Pain Score  4     Pain Location  Knee    Pain Orientation  Right    Pain Descriptors / Indicators  Sore    Pain Onset  1 to 4 weeks ago    Pain Frequency  Intermittent                       [facility] Adult PT Treatment/Exercise - 09/06/19 0001      Knee/Hip Exercises: Aerobic   Stationary Bike  L4 x71min  Knee/Hip Exercises: Machines for Strengthening   Cybex Knee Extension  20# 4x10    Cybex Knee Flexion  40# 4x10 reps    Cybex Leg Press  1  1/2 pl, seat 7 3x 20  reps      Knee/Hip Exercises: Standing   Lateral Step Up  Right;2 sets;10 reps;Hand Hold: 0;Step Height: 6"    Forward Step Up  Right;2 sets;10 reps;Step Height: 6";Hand Hold: 0    SLS  x 5 mins      Electrical Stimulation   Electrical Stimulation Location  Rt knee    Electrical Stimulation Action  IFC 1-10hz  x15 mins    Electrical Stimulation Goals  Pain      Vasopneumatic   Number Minutes Vasopneumatic   15 minutes    Vasopnuematic Location   Knee    Vasopneumatic Pressure  Medium    Vasopneumatic Temperature   34               PT Short Term Goals - 09/02/19 0827      PT SHORT TERM GOAL #1   Title  Patient will be independent with initial HEP    Time  3    Period  Weeks     Status  Achieved   09/02/19     PT SHORT TERM GOAL #2   Title  Patient will demonstrate 100+ degrees of right knee flexion AROM to improve ability to perform functional tasks.    Time  2    Period  Weeks    Status  Achieved      PT SHORT TERM GOAL #3   Title  Patient will ambulate with straight cane or no AD with minimal gait deviations to improve gait mechanics.    Time  2    Period  Weeks    Status  Achieved        PT Long Term Goals - 09/02/19 [id number]      PT LONG TERM GOAL #1   Title  Patient will be independent with advanced HEP    Time  4    Period  Weeks    Status  On-going      PT LONG TERM GOAL #2   Title  Patient will demonstrate 115+ degrees of right knee flexion AROM to improve ability to perform functional tasks.    Time  4    Period  Weeks    Status  Achieved      PT LONG TERM GOAL #3   Title  Patient will demonstrate 0 degrees of right knee extension AROM to improve gait mechanics.    Time  4    Period  Weeks    Status  Achieved      PT LONG TERM GOAL #4   Title  Patient will report ability to perform ADLs and home activities with right knee pain less than or equal to 3/10.    Time  4    Period  Weeks    Status  On-going   about 3-4/10 per reported 09/02/19     PT LONG TERM GOAL #5   Title  Patient will demonstrate reciprocating stair negotiation with 1 rail and no AD to safely enter and exit home.    Time  4    Status  On-going            Plan - 09/06/19 0825    Clinical Impression Statement  Pt arrived today doing fairly well with minimal complaints. He was able  to continue progressing strengthening for RT LE and did well. Pt still with balance deficit with SLS on RT and discussed HEP. Normal modality response today.    Personal Factors and Comorbidities  Age;Comorbidity 2    Comorbidities  R TKA 08/15/2019; L TKA 06/27/2019; HTN, DM    Stability/Clinical Decision Making  Stable/Uncomplicated    Rehab Potential  Good    PT Frequency  3x /  week    PT Duration  6 weeks    PT Treatment/Interventions  ADLs/Self Care Home Management;Cryotherapy;Electrical Stimulation;Iontophoresis 4mg /ml Dexamethasone;Moist Heat;Balance training;Therapeutic exercise;Therapeutic activities;Functional mobility training;Stair training;Gait training;Neuromuscular re-education;Patient/family education;Passive range of motion;Vasopneumatic Device;Taping;Manual techniques    PT Next Visit Plan  Cont with strengthening and modalities PRN    PT Home Exercise Plan  see patient education section    Consulted and Agree with Plan of Care  Patient       Patient will benefit from skilled therapeutic intervention in order to improve the following deficits and impairments:  Decreased activity tolerance, Decreased strength, Decreased range of motion, Difficulty walking, Pain  Visit Diagnosis: Acute pain of right knee  Stiffness of right knee, not elsewhere classified     Problem List Patient Active Problem List   Diagnosis Date Noted  . Osteoarthritis of right knee 08/15/2019  . Osteoarthritis of left knee 06/27/2019    RAMSEUR,CHRIS, PTA 09/06/2019, 9:16 AM  [facility] [place], [place], [address] Phone: [phone]   Fax:  [phone]  Name: Bryan Atkinson MRN: [id number] Date of Birth: 1956/06/10

## 2019-09-09 ENCOUNTER — Other Ambulatory Visit: Payer: Self-pay

## 2019-09-09 ENCOUNTER — Encounter: Payer: Self-pay | Admitting: Physical Therapy

## 2019-09-09 ENCOUNTER — Ambulatory Visit: Payer: 59 | Admitting: Physical Therapy

## 2019-09-09 DIAGNOSIS — M25561 Pain in right knee: Secondary | ICD-10-CM | POA: Diagnosis not present

## 2019-09-09 DIAGNOSIS — M25661 Stiffness of right knee, not elsewhere classified: Secondary | ICD-10-CM

## 2019-09-09 NOTE — Therapy (Signed)
[facility] [place], [place], [address] Phone: [phone]   Fax:  [phone]  Physical Therapy Treatment  Progress Note Reporting Period 08/19/2019 to 09/09/2019  See note below for Objective Data and Assessment of Progress/Goals. Patient has responded well to therapy session and has made significant progress. Gabriela Eves, PT, DPT      Patient Details  Name: Bryan Atkinson MRN: [id number] Date of Birth: Dec 17, 1955 Referring Provider (PT): Rod Can, MD   Encounter Date: 09/09/2019  PT End of Session - 09/09/19 0759    Visit Number  10    Number of Visits  12    Date for PT Re-Evaluation  10/07/19    Authorization Type  FOTO; progress note every 10th visit    PT Start Time  0731    PT Stop Time  0821    PT Time Calculation (min)  50 min    Activity Tolerance  Patient tolerated treatment well    Behavior During Therapy  [facility] for tasks assessed/performed       Past Medical History:  Diagnosis Date  . Arthritis   . Diabetes mellitus without complication ([place])   . Gout   . History of kidney stones   . Hypertension     Past Surgical History:  Procedure Laterality Date  . CATARACT EXTRACTION W/PHACO Right 05/13/2019   Procedure: CATARACT EXTRACTION PHACO AND INTRAOCULAR LENS PLACEMENT (IOC);  Surgeon: Baruch Goldmann, MD;  [facility];  Service: Ophthalmology;  Laterality: Right;  CDE: 10.31  . CATARACT EXTRACTION W/PHACO Left 05/27/2019   Procedure: CATARACT EXTRACTION PHACO AND INTRAOCULAR LENS PLACEMENT (IOC);  Surgeon: Baruch Goldmann, MD;  [facility]: [facility];  Service: Ophthalmology;  Laterality: Left;  CDE: 6.49  . COLONOSCOPY     times two 2002 ,2007  . COLONOSCOPY N/A 10/07/2013   Procedure: COLONOSCOPY;  Surgeon: Daneil Dolin, MD;  [facility];  Service: Endoscopy;  Laterality: N/A;  8:30 AM  . COLONOSCOPY N/A 10/05/2018   Procedure: COLONOSCOPY;  Surgeon: Daneil Dolin, MD;  [facility];  Service: Endoscopy;  Laterality: N/A;  8:30  . JOINT REPLACEMENT    . KNEE ARTHROPLASTY Left 06/27/2019   Procedure: COMPUTER ASSISTED TOTAL KNEE ARTHROPLASTY;  Surgeon: Rod Can, MD;  [facility];  Service: Orthopedics;  Laterality: Left;  . KNEE ARTHROPLASTY Right 08/15/2019   Procedure: COMPUTER ASSISTED TOTAL KNEE ARTHROPLASTY;  Surgeon: Rod Can, MD;  [facility];  Service: Orthopedics;  Laterality: Right;  . LEG SURGERY Right early 30's  . NO PAST SURGERIES      There were no vitals filed for this visit.  Subjective Assessment - 09/09/19 0757    Subjective  COVID 19 screening performed on patient upon arrival. Patient reports doing "alright"    Patient is accompained by:  Family member    Pertinent History  right TKA 08/15/2019; left TKA 9/10/202; HTN, DM    Limitations  Sitting;Standing;Walking;House hold activities    How long can you sit comfortably?  1 hr    How long can you stand comfortably?  20-30 mins    How long can you walk comfortably?  room to room    Diagnostic tests  x-ray    Patient Stated Goals  decrease pain, return to normal activities.    Currently in Pain?  Yes    Pain Score  2     Pain Location  Knee    Pain Orientation  Right  Pain Descriptors / Indicators  Sore    Pain Type  Surgical pain    Pain Onset  More than a month ago    Pain Frequency  Intermittent         [facility] PT Assessment - 09/09/19 0001      Assessment   Medical Diagnosis  osteoarthritis of right knee    Referring Provider (PT)  Rod Can, MD    Onset Date/Surgical Date  08/15/19    Next MD Visit  08/30/2019    Prior Therapy  yes      Precautions   Precautions  Other (comment)    Precaution Comments  no ultrasound      ROM / Strength   AROM / PROM / Strength  AROM;Strength      AROM   Overall AROM   Within functional limits for tasks performed    AROM Assessment Site  Knee    Right/Left Knee  Right    Right  Knee Extension  0    Right Knee Flexion  120      Strength   Strength Assessment Site  Knee    Right/Left Knee  Right    Right Knee Flexion  4+/5    Right Knee Extension  5/5                   [facility] Adult PT Treatment/Exercise - 09/09/19 0001      Knee/Hip Exercises: Aerobic   Stationary Bike  L4 x25min      Knee/Hip Exercises: Machines for Strengthening   Cybex Knee Extension  20# 3x10    Cybex Knee Flexion  40# 3x10 reps    Cybex Leg Press  1  1/2 pl, seat 7 3x 10  reps      Knee/Hip Exercises: Standing   Rocker Board  3 minutes    SLS  x3 mins      Electrical Stimulation   Electrical Stimulation Location  Rt knee    Electrical Stimulation Action  IFC    Electrical Stimulation Parameters  1-10 hz x10 mins    Electrical Stimulation Goals  Pain      Vasopneumatic   Number Minutes Vasopneumatic   15 minutes    Vasopnuematic Location   Knee    Vasopneumatic Pressure  Medium    Vasopneumatic Temperature   34               PT Short Term Goals - 09/02/19 0827      PT SHORT TERM GOAL #1   Title  Patient will be independent with initial HEP    Time  3    Period  Weeks    Status  Achieved   09/02/19     PT SHORT TERM GOAL #2   Title  Patient will demonstrate 100+ degrees of right knee flexion AROM to improve ability to perform functional tasks.    Time  2    Period  Weeks    Status  Achieved      PT SHORT TERM GOAL #3   Title  Patient will ambulate with straight cane or no AD with minimal gait deviations to improve gait mechanics.    Time  2    Period  Weeks    Status  Achieved        PT Long Term Goals - 09/09/19 [id number]      PT LONG TERM GOAL #1   Title  Patient will be independent with advanced HEP  Time  4    Period  Weeks    Status  Achieved      PT LONG TERM GOAL #2   Title  Patient will demonstrate 115+ degrees of right knee flexion AROM to improve ability to perform functional tasks.    Time  4    Period  Weeks    Status   Achieved      PT LONG TERM GOAL #3   Title  Patient will demonstrate 0 degrees of right knee extension AROM to improve gait mechanics.    Time  4    Period  Weeks    Status  Achieved      PT LONG TERM GOAL #4   Title  Patient will report ability to perform ADLs and home activities with right knee pain less than or equal to 3/10.    Time  4    Period  Weeks    Status  On-going      PT LONG TERM GOAL #5   Title  Patient will demonstrate reciprocating stair negotiation with 1 rail and no AD to safely enter and exit home.    Time  4    Period  Weeks    Status  On-going            Plan - 09/09/19 0755    Clinical Impression Statement  Patient responded well to therapy session with no reports of increased pain. Patient demonstrated good form for all exercises. Patient requires intermittent toe touch down and UE support for single lege stance. Patient noted with normal responses to modalities upon removal. FOTO limitation 28%.    Personal Factors and Comorbidities  Age;Comorbidity 2    Comorbidities  R TKA 08/15/2019; L TKA 06/27/2019; HTN, DM    Examination-Activity Limitations  Stand;Stairs;Sit    Examination-Participation Restrictions  Driving    Stability/Clinical Decision Making  Stable/Uncomplicated    Clinical Decision Making  Low    Rehab Potential  Good    PT Frequency  3x / week    PT Duration  6 weeks    PT Treatment/Interventions  ADLs/Self Care Home Management;Cryotherapy;Electrical Stimulation;Iontophoresis 4mg /ml Dexamethasone;Moist Heat;Balance training;Therapeutic exercise;Therapeutic activities;Functional mobility training;Stair training;Gait training;Neuromuscular re-education;Patient/family education;Passive range of motion;Vasopneumatic Device;Taping;Manual techniques    PT Next Visit Plan  Cont with strengthening and modalities PRN    Consulted and Agree with Plan of Care  Patient       Patient will benefit from skilled therapeutic intervention in order to  improve the following deficits and impairments:  Decreased activity tolerance, Decreased strength, Decreased range of motion, Difficulty walking, Pain  Visit Diagnosis: Acute pain of right knee  Stiffness of right knee, not elsewhere classified     Problem List Patient Active Problem List   Diagnosis Date Noted  . Osteoarthritis of right knee 08/15/2019  . Osteoarthritis of left knee 06/27/2019    Gabriela Eves, PT, DPT 09/09/2019, 9:06 AM  [facility] [address] [place], [place], [address] Phone: [phone]   Fax:  [phone]  Name: Bryan Atkinson MRN: [id number] Date of Birth: 1956-04-13

## 2019-09-11 ENCOUNTER — Ambulatory Visit: Payer: 59 | Admitting: Physical Therapy

## 2019-09-11 ENCOUNTER — Other Ambulatory Visit: Payer: Self-pay

## 2019-09-11 ENCOUNTER — Encounter: Payer: Self-pay | Admitting: Physical Therapy

## 2019-09-11 DIAGNOSIS — M25561 Pain in right knee: Secondary | ICD-10-CM | POA: Diagnosis not present

## 2019-09-11 DIAGNOSIS — M25661 Stiffness of right knee, not elsewhere classified: Secondary | ICD-10-CM

## 2019-09-11 NOTE — Therapy (Signed)
[facility] [place], [place], [address] Phone: [phone]   Fax:  [phone]  Physical Therapy Treatment  Patient Details  Name: Bryan Atkinson MRN: [id number] Date of Birth: 1956-07-17 Referring Provider (PT): Rod Can, MD   Encounter Date: 09/11/2019  PT End of Session - 09/11/19 0742    Visit Number  11    Number of Visits  12    Date for PT Re-Evaluation  10/07/19    Authorization Type  FOTO; progress note every 10th visit    PT Start Time  0731    PT Stop Time  0821    PT Time Calculation (min)  50 min    Activity Tolerance  Patient tolerated treatment well    Behavior During Therapy  [facility] for tasks assessed/performed       Past Medical History:  Diagnosis Date  . Arthritis   . Diabetes mellitus without complication ([place])   . Gout   . History of kidney stones   . Hypertension     Past Surgical History:  Procedure Laterality Date  . CATARACT EXTRACTION W/PHACO Right 05/13/2019   Procedure: CATARACT EXTRACTION PHACO AND INTRAOCULAR LENS PLACEMENT (IOC);  Surgeon: Baruch Goldmann, MD;  [facility]: [facility];  Service: Ophthalmology;  Laterality: Right;  CDE: 10.31  . CATARACT EXTRACTION W/PHACO Left 05/27/2019   Procedure: CATARACT EXTRACTION PHACO AND INTRAOCULAR LENS PLACEMENT (IOC);  Surgeon: Baruch Goldmann, MD;  Location: [facility];  Service: Ophthalmology;  Laterality: Left;  CDE: 6.49  . COLONOSCOPY     times two 2002 ,2007  . COLONOSCOPY N/A 10/07/2013   Procedure: COLONOSCOPY;  Surgeon: Daneil Dolin, MD;  [facility];  Service: Endoscopy;  Laterality: N/A;  8:30 AM  . COLONOSCOPY N/A 10/05/2018   Procedure: COLONOSCOPY;  Surgeon: Daneil Dolin, MD;  [facility];  Service: Endoscopy;  Laterality: N/A;  8:30  . JOINT REPLACEMENT    . KNEE ARTHROPLASTY Left 06/27/2019   Procedure: COMPUTER ASSISTED TOTAL KNEE ARTHROPLASTY;  Surgeon: Rod Can, MD;  [facility];   Service: Orthopedics;  Laterality: Left;  . KNEE ARTHROPLASTY Right 08/15/2019   Procedure: COMPUTER ASSISTED TOTAL KNEE ARTHROPLASTY;  Surgeon: Rod Can, MD;  [facility];  Service: Orthopedics;  Laterality: Right;  . LEG SURGERY Right early 16's  . NO PAST SURGERIES      There were no vitals filed for this visit.  Subjective Assessment - 09/11/19 0732    Subjective  COVID 19 screening performed on patient upon arrival. Patient reports a little ache.    Pertinent History  right TKA 08/15/2019; left TKA 9/10/202; HTN, DM    Limitations  Sitting;Standing;Walking;House hold activities    How long can you sit comfortably?  1 hr    How long can you stand comfortably?  20-30 mins    How long can you walk comfortably?  room to room    Diagnostic tests  x-ray    Patient Stated Goals  decrease pain, return to normal activities.    Currently in Pain?  No/denies    Pain Location  --    Pain Orientation  --    Pain Descriptors / Indicators  --    Pain Type  --    Pain Onset  --         [facility] PT Assessment - 09/11/19 0001      Assessment   Medical Diagnosis  osteoarthritis of right knee    Referring Provider (  PT)  Rod Can, MD    Onset Date/Surgical Date  08/15/19    Next MD Visit  08/30/2019    Prior Therapy  yes      Precautions   Precautions  Other (comment)    Precaution Comments  no ultrasound                   [facility] Adult PT Treatment/Exercise - 09/11/19 0001      Knee/Hip Exercises: Aerobic   Stationary Bike  L4 x57min, seat 4      Knee/Hip Exercises: Machines for Strengthening   Cybex Knee Extension  20# 3x10 reps    Cybex Knee Flexion  40# 3x10 reps    Cybex Leg Press  2 pl, seat 7 x30 reps      Knee/Hip Exercises: Standing   Terminal Knee Extension  Strengthening;Right;3 sets;10 reps;Theraband    Theraband Level (Terminal Knee Extension)  Other (comment)   Orange XTS   Forward Step Up  Right;2 sets;10 reps;Hand Hold: 2;Step Height:  8"    Step Down  Right;2 sets;10 reps;Hand Hold: 2;Step Height: 4"   Heel dot     Modalities   Modalities  Vasopneumatic      Vasopneumatic   Number Minutes Vasopneumatic   15 minutes    Vasopnuematic Location   Knee    Vasopneumatic Pressure  Medium    Vasopneumatic Temperature   34               PT Short Term Goals - 09/02/19 0827      PT SHORT TERM GOAL #1   Title  Patient will be independent with initial HEP    Time  3    Period  Weeks    Status  Achieved   09/02/19     PT SHORT TERM GOAL #2   Title  Patient will demonstrate 100+ degrees of right knee flexion AROM to improve ability to perform functional tasks.    Time  2    Period  Weeks    Status  Achieved      PT SHORT TERM GOAL #3   Title  Patient will ambulate with straight cane or no AD with minimal gait deviations to improve gait mechanics.    Time  2    Period  Weeks    Status  Achieved        PT Long Term Goals - 09/11/19 0813      PT LONG TERM GOAL #1   Title  Patient will be independent with advanced HEP    Time  4    Period  Weeks    Status  Achieved      PT LONG TERM GOAL #2   Title  Patient will demonstrate 115+ degrees of right knee flexion AROM to improve ability to perform functional tasks.    Time  4    Period  Weeks    Status  Achieved      PT LONG TERM GOAL #3   Title  Patient will demonstrate 0 degrees of right knee extension AROM to improve gait mechanics.    Time  4    Period  Weeks    Status  Achieved      PT LONG TERM GOAL #4   Title  Patient will report ability to perform ADLs and home activities with right knee pain less than or equal to 3/10.    Time  4    Period  Weeks  Status  Achieved      PT LONG TERM GOAL #5   Title  Patient will demonstrate reciprocating stair negotiation with 1 rail and no AD to safely enter and exit home.    Time  4    Period  Weeks    Status  On-going            Plan - 09/11/19 [id number]    Clinical Impression Statement   Patient presented in clinic with only reports of intermittant ache in R knee. Patient functional with stairs and he is going into his basement. Patient able to complete all therex with mod VCs and demo for proper technique. No abnormal compensatory strategies noted therex. Minimal to moderate edema present surrounding the R patella. Normal modalities response noted following removal of the modalities.    Personal Factors and Comorbidities  Age;Comorbidity 2    Comorbidities  R TKA 08/15/2019; L TKA 06/27/2019; HTN, DM    Examination-Activity Limitations  Stand;Stairs;Sit    Examination-Participation Restrictions  Driving    Stability/Clinical Decision Making  Stable/Uncomplicated    Rehab Potential  Good    PT Frequency  3x / week    PT Duration  6 weeks    PT Treatment/Interventions  ADLs/Self Care Home Management;Cryotherapy;Electrical Stimulation;Iontophoresis 4mg /ml Dexamethasone;Moist Heat;Balance training;Therapeutic exercise;Therapeutic activities;Functional mobility training;Stair training;Gait training;Neuromuscular re-education;Patient/family education;Passive range of motion;Vasopneumatic Device;Taping;Manual techniques    PT Next Visit Plan  Cont with strengthening and modalities PRN    PT Home Exercise Plan  see patient education section    Consulted and Agree with Plan of Care  Patient       Patient will benefit from skilled therapeutic intervention in order to improve the following deficits and impairments:  Decreased activity tolerance, Decreased strength, Decreased range of motion, Difficulty walking, Pain  Visit Diagnosis: Acute pain of right knee  Stiffness of right knee, not elsewhere classified     Problem List Patient Active Problem List   Diagnosis Date Noted  . Osteoarthritis of right knee 08/15/2019  . Osteoarthritis of left knee 06/27/2019    Standley Brooking, PTA 09/11/2019, 8:51 AM  [facility] [address] [place], [place], [address] Phone: [phone]   Fax:  [phone]  Name: Theon Diop MRN: [id number] Date of Birth: May 26, 1956

## 2019-09-16 ENCOUNTER — Encounter: Payer: Self-pay | Admitting: Physical Therapy

## 2019-09-16 ENCOUNTER — Ambulatory Visit: Payer: 59 | Admitting: Physical Therapy

## 2019-09-16 ENCOUNTER — Other Ambulatory Visit: Payer: Self-pay

## 2019-09-16 DIAGNOSIS — M25561 Pain in right knee: Secondary | ICD-10-CM

## 2019-09-16 DIAGNOSIS — M25661 Stiffness of right knee, not elsewhere classified: Secondary | ICD-10-CM

## 2019-09-16 NOTE — Therapy (Signed)
[facility] [place], [place], [address] Phone: [phone]   Fax:  [phone]  Physical Therapy Treatment PHYSICAL THERAPY DISCHARGE SUMMARY  Visits from Start of Care: 12  Current functional level related to goals / functional outcomes: See  below   Remaining deficits: All goals met   Education / Equipment: HEP  Plan: Patient agrees to discharge.  Patient goals were met. Patient is being discharged due to meeting the stated rehab goals.  ?????   Gabriela Eves, PT, DPT  Patient Details  Name: Bryan Atkinson MRN: [id number] Date of Birth: 1955-12-27 Referring Provider (PT): Rod Can, MD   Encounter Date: 09/16/2019  PT End of Session - 09/16/19 0744    Visit Number  12    Number of Visits  12    Date for PT Re-Evaluation  10/07/19    Authorization Type  FOTO; progress note every 10th visit    PT Start Time  0732    PT Stop Time  0819    PT Time Calculation (min)  47 min    Equipment Utilized During Treatment  Other (comment)    Activity Tolerance  Patient tolerated treatment well    Behavior During Therapy  [facility] for tasks assessed/performed       Past Medical History:  Diagnosis Date  . Arthritis   . Diabetes mellitus without complication ([place])   . Gout   . History of kidney stones   . Hypertension     Past Surgical History:  Procedure Laterality Date  . CATARACT EXTRACTION W/PHACO Right 05/13/2019   Procedure: CATARACT EXTRACTION PHACO AND INTRAOCULAR LENS PLACEMENT (IOC);  Surgeon: Baruch Goldmann, MD;  Location: [facility];  Service: Ophthalmology;  Laterality: Right;  CDE: 10.31  . CATARACT EXTRACTION W/PHACO Left 05/27/2019   Procedure: CATARACT EXTRACTION PHACO AND INTRAOCULAR LENS PLACEMENT (IOC);  Surgeon: Baruch Goldmann, MD;  Location: [facility];  Service: Ophthalmology;  Laterality: Left;  CDE: 6.49  . COLONOSCOPY     times two 2002 ,2007  . COLONOSCOPY N/A 10/07/2013   Procedure:  COLONOSCOPY;  Surgeon: Daneil Dolin, MD;  [facility];  Service: Endoscopy;  Laterality: N/A;  8:30 AM  . COLONOSCOPY N/A 10/05/2018   Procedure: COLONOSCOPY;  Surgeon: Daneil Dolin, MD;  [facility];  Service: Endoscopy;  Laterality: N/A;  8:30  . JOINT REPLACEMENT    . KNEE ARTHROPLASTY Left 06/27/2019   Procedure: COMPUTER ASSISTED TOTAL KNEE ARTHROPLASTY;  Surgeon: Rod Can, MD;  Location: [facility];  Service: Orthopedics;  Laterality: Left;  . KNEE ARTHROPLASTY Right 08/15/2019   Procedure: COMPUTER ASSISTED TOTAL KNEE ARTHROPLASTY;  Surgeon: Rod Can, MD;  Location: [facility];  Service: Orthopedics;  Laterality: Right;  . LEG SURGERY Right early 58's  . NO PAST SURGERIES      There were no vitals filed for this visit.  Subjective Assessment - 09/16/19 0741    Subjective  COVID-19 screening performed upon arrival. Patient reports he is ready for discharge but the knee is feeling a little sore due to the weather.    Pertinent History  right TKA 08/15/2019; left TKA 9/10/202; HTN, DM    Limitations  Sitting;Standing;Walking;House hold activities    How long can you sit comfortably?  1 hr    How long can you stand comfortably?  20-30 mins    How long can you walk comfortably?  room to room    Diagnostic tests  x-ray    Patient  Stated Goals  decrease pain, return to normal activities.    Currently in Pain?  Yes   did not provide number on pain scale        [facility] PT Assessment - 09/16/19 0001      Assessment   Medical Diagnosis  osteoarthritis of right knee    Referring Provider (PT)  Rod Can, MD    Onset Date/Surgical Date  08/15/19    Prior Therapy  yes      Precautions   Precautions  Other (comment)    Precaution Comments  no ultrasound      Observation/Other Assessments   Focus on Therapeutic Outcomes (FOTO)   28% limited      ROM / Strength   AROM / PROM / Strength  AROM;Strength      AROM   Overall AROM   Within  functional limits for tasks performed    AROM Assessment Site  Knee    Right/Left Knee  Right    Right Knee Extension  0    Right Knee Flexion  121      Strength   Right Knee Flexion  4+/5    Right Knee Extension  5/5                   [facility] Adult PT Treatment/Exercise - 09/16/19 0001      Knee/Hip Exercises: Aerobic   Stationary Bike  L4 x22mn, seat 4      Knee/Hip Exercises: Machines for Strengthening   Cybex Knee Extension  20# 3x10 reps    Cybex Knee Flexion  40# 3x10 reps    Cybex Leg Press  1.5 pl, seat 7 x30 reps; eccentric control R 1.5 plates x20      Knee/Hip Exercises: Standing   SLS  x3 mins      Modalities   Modalities  Vasopneumatic      Vasopneumatic   Number Minutes Vasopneumatic   15 minutes    Vasopnuematic Location   Knee    Vasopneumatic Pressure  Medium    Vasopneumatic Temperature   34               PT Short Term Goals - 09/02/19 0827      PT SHORT TERM GOAL #1   Title  Patient will be independent with initial HEP    Time  3    Period  Weeks    Status  Achieved   09/02/19     PT SHORT TERM GOAL #2   Title  Patient will demonstrate 100+ degrees of right knee flexion AROM to improve ability to perform functional tasks.    Time  2    Period  Weeks    Status  Achieved      PT SHORT TERM GOAL #3   Title  Patient will ambulate with straight cane or no AD with minimal gait deviations to improve gait mechanics.    Time  2    Period  Weeks    Status  Achieved        PT Long Term Goals - 09/16/19 02130     PT LONG TERM GOAL #1   Title  Patient will be independent with advanced HEP    Time  4    Period  Weeks    Status  Achieved      PT LONG TERM GOAL #2   Title  Patient will demonstrate 115+ degrees of right knee flexion AROM to improve ability to perform  functional tasks.    Time  4    Period  Weeks    Status  Achieved      PT LONG TERM GOAL #3   Title  Patient will demonstrate 0 degrees of right knee  extension AROM to improve gait mechanics.    Time  4    Period  Weeks    Status  Achieved      PT LONG TERM GOAL #4   Title  Patient will report ability to perform ADLs and home activities with right knee pain less than or equal to 3/10.    Time  4    Period  Weeks    Status  Achieved      PT LONG TERM GOAL #5   Title  Patient will demonstrate reciprocating stair negotiation with 1 rail and no AD to safely enter and exit home.    Time  4    Period  Weeks    Status  Achieved            Plan - 09/16/19 0757    Clinical Impression Statement  Patient responded well to therapy session to therapy session with minimal reports of increased pain. Patient able to complete all TEs with good form and technique. Patient noted with improved right SLS but still with intermittent UE support. Patient's FOTO 28% limitation. All goals met at discharge.    Personal Factors and Comorbidities  Age;Comorbidity 2    Comorbidities  R TKA 08/15/2019; L TKA 06/27/2019; HTN, DM    Examination-Activity Limitations  Stand;Stairs;Sit    Examination-Participation Restrictions  Driving    Stability/Clinical Decision Making  Stable/Uncomplicated    Clinical Decision Making  Low    Rehab Potential  Good    PT Frequency  3x / week    PT Duration  6 weeks    PT Treatment/Interventions  ADLs/Self Care Home Management;Cryotherapy;Electrical Stimulation;Iontophoresis 1m/ml Dexamethasone;Moist Heat;Balance training;Therapeutic exercise;Therapeutic activities;Functional mobility training;Stair training;Gait training;Neuromuscular re-education;Patient/family education;Passive range of motion;Vasopneumatic Device;Taping;Manual techniques    PT Next Visit Plan  Discharge    PT Home Exercise Plan  see patient education section    Consulted and Agree with Plan of Care  Patient       Patient will benefit from skilled therapeutic intervention in order to improve the following deficits and impairments:  Decreased activity  tolerance, Decreased strength, Decreased range of motion, Difficulty walking, Pain  Visit Diagnosis: Acute pain of right knee  Stiffness of right knee, not elsewhere classified     Problem List Patient Active Problem List   Diagnosis Date Noted  . Osteoarthritis of right knee 08/15/2019  . Osteoarthritis of left knee 06/27/2019    KGabriela Eves11/30/2020, 8:22 AM  [facility] [address]Phone: [phone]  Fax:  [phone] Name: JGunter CondeMRN: [id number] of Birth: 406-May-1957

## 2020-05-20 ENCOUNTER — Ambulatory Visit (INDEPENDENT_AMBULATORY_CARE_PROVIDER_SITE_OTHER): Payer: 59 | Admitting: Gastroenterology

## 2020-05-20 ENCOUNTER — Telehealth: Payer: Self-pay | Admitting: *Deleted

## 2020-05-20 ENCOUNTER — Encounter: Payer: Self-pay | Admitting: *Deleted

## 2020-05-20 ENCOUNTER — Other Ambulatory Visit: Payer: Self-pay

## 2020-05-20 ENCOUNTER — Encounter: Payer: Self-pay | Admitting: Gastroenterology

## 2020-05-20 VITALS — BP 118/74 | HR 74 | Temp 97.3°F | Ht 69.0 in | Wt 212.6 lb

## 2020-05-20 DIAGNOSIS — D649 Anemia, unspecified: Secondary | ICD-10-CM | POA: Diagnosis not present

## 2020-05-20 DIAGNOSIS — R630 Anorexia: Secondary | ICD-10-CM | POA: Diagnosis not present

## 2020-05-20 DIAGNOSIS — N189 Chronic kidney disease, unspecified: Secondary | ICD-10-CM | POA: Insufficient documentation

## 2020-05-20 DIAGNOSIS — R109 Unspecified abdominal pain: Secondary | ICD-10-CM | POA: Insufficient documentation

## 2020-05-20 DIAGNOSIS — R1084 Generalized abdominal pain: Secondary | ICD-10-CM | POA: Diagnosis not present

## 2020-05-20 DIAGNOSIS — R110 Nausea: Secondary | ICD-10-CM | POA: Diagnosis not present

## 2020-05-20 MED ORDER — OMEPRAZOLE 20 MG PO CPDR: 20.0000 mg | DELAYED_RELEASE_CAPSULE | Freq: Every day | ORAL | 1 refills | Status: DC

## 2020-05-20 NOTE — H&P (View-Only) (Signed)
Primary Care Physician:  Everardo Beals, NP  Primary Gastroenterologist:  Garfield Cornea, MD   Chief Complaint  Patient presents with  . Anemia    TCS done 2019. Does not have dark/bloody stools    HPI:  Bryan Atkinson is a 64 y.o. male here for further evaluation of anemia at the request of Everardo Beals, NP  Patient states for the past 3 weeks he has had significant fatigue.  No dizziness, shortness of breath, chest pain.  He has noted change in his appetite which has been poor.  Some nausea but no vomiting.  Little generalized discomfort but seems to be better with meals.  Bowel movements regular.  No blood in stool or melena.  No significant nosebleeds or gross hematuria.  Occasional heartburn.  Recently started ferrous sulfate 325 mg daily.  No longer on PPI.  He takes 2 Aleve PMs every night.  No aspirin powders.  He has a prescription for naproxen which he takes only as needed but not on a daily basis.  Hemoglobin reportedly 12.4 in March 2021, down to 8.18 April 2020.  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  Patient seen by hematology remotely in 2016 for anemia.  Anemia felt to be due to chronic kidney disease, anemia of chronic disease, hypogonadism.  Never required blood transfusions or iron infusions.    Wt Readings from Last 3 Encounters:  05/20/20 212 lb 9.6 oz (96.4 kg)  08/15/19 224 lb 5 oz (101.7 kg)  08/12/19 224 lb 5 oz (101.7 kg)     Current Outpatient Medications  Medication Sig Dispense Refill  . allopurinol (ZYLOPRIM) 300 MG tablet Take 300 mg by mouth daily.    Marland Kitchen amLODipine (NORVASC) 10 MG tablet Take 10 mg by mouth daily.     Marland Kitchen Apple Cider Vinegar 500 MG TABS Take 500 mg by mouth daily.     Marland Kitchen atenolol (TENORMIN) 50 MG tablet Take 50 mg by mouth 2 (two) times daily.     Marland Kitchen Co-Enzyme Q10 100 MG CAPS Take 100 mg by mouth daily.     . Cranberry 500 MG TABS Take 500  mg by mouth daily.    . fenofibrate (TRICOR) 145 MG tablet Take 145 mg by mouth daily.    . ferrous sulfate 325 (65 FE) MG tablet Take 325 mg by mouth daily with breakfast.    . Flaxseed, Linseed, (FLAXSEED OIL) 1000 MG CAPS Take 1,000 mg by mouth daily.    Marland Kitchen glucose blood test strip 1 each by Other route as needed for other. Use as instructed    . Krill Oil 500 MG CAPS Take 500 mg by mouth 2 (two) times a day.    . lisinopril-hydrochlorothiazide (PRINZIDE,ZESTORETIC) 20-25 MG per tablet Take 1 tablet by mouth daily.     . metFORMIN (GLUCOPHAGE) 1000 MG tablet Take 1,000 mg by mouth 2 (two) times daily.  5  . Multiple Vitamin (MULTIVITAMIN) tablet Take 1 tablet by mouth daily.    . naproxen (NAPROSYN) 500 MG tablet Take 500 mg by mouth daily as needed for moderate pain.     . niacin (NIASPAN) 1000 MG CR tablet Take 1,000 mg by mouth at bedtime.    . pravastatin (PRAVACHOL) 40 MG tablet Take 40 mg by mouth at bedtime.     . Turmeric 500 MG CAPS Take 500 mg by mouth daily.    Marland Kitchen  No current facility-administered medications for this visit.    Allergies as of 05/20/2020  . (No Known Allergies)    Past Medical History:  Diagnosis Date  . Arthritis   . Diabetes mellitus without complication ([place])   . Gout   . History of kidney stones   . Hyperlipidemia   . Hypertension     Past Surgical History:  Procedure Laterality Date  . CATARACT EXTRACTION W/PHACO Right 05/13/2019   Procedure: CATARACT EXTRACTION PHACO AND INTRAOCULAR LENS PLACEMENT (IOC);  Surgeon: Baruch Goldmann, MD;  [facility]: [facility];  Service: Ophthalmology;  Laterality: Right;  CDE: 10.31  . CATARACT EXTRACTION W/PHACO Left 05/27/2019   Procedure: CATARACT EXTRACTION PHACO AND INTRAOCULAR LENS PLACEMENT (IOC);  Surgeon: Baruch Goldmann, MD;  Location: [facility];  Service: Ophthalmology;  Laterality: Left;  CDE: 6.49  . COLONOSCOPY     times two 2002 ,2007  . COLONOSCOPY N/A 10/07/2013   Procedure: COLONOSCOPY;  Surgeon:  Daneil Dolin, MD;  [facility];  Service: Endoscopy;  Laterality: N/A;  8:30 AM  . COLONOSCOPY N/A 10/05/2018   Dr. Gala Romney: Diverticulosis.  Next colonoscopy 5 years due to first-degree relative with colon cancer  . JOINT REPLACEMENT    . KNEE ARTHROPLASTY Left 06/27/2019   Procedure: COMPUTER ASSISTED TOTAL KNEE ARTHROPLASTY;  Surgeon: Rod Can, MD;  [facility];  Service: Orthopedics;  Laterality: Left;  . KNEE ARTHROPLASTY Right 08/15/2019   Procedure: COMPUTER ASSISTED TOTAL KNEE ARTHROPLASTY;  Surgeon: Rod Can, MD;  [facility];  Service: Orthopedics;  Laterality: Right;  . LEG SURGERY Right early 67's    Family History  Problem Relation Age of Onset  . Colon cancer Mother        She died from her malignancy, 27  . Lung cancer Father        Died from lung cancer, age 20  . Emphysema Brother        Died at 56 from lung disease    Social History   Socioeconomic History  . Marital status: Married    Spouse name: Not on file  . Number of children: Not on file  . Years of education: Not on file  . Highest education level: Not on file  Occupational History  . 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 Smoking status: Never Smoker  . Smokeless tobacco: Never Used  Vaping Use  . Vaping Use: Never used  Substance and Sexual Activity  . Alcohol use: No  . Drug use: No  . Sexual activity: Yes  Other Topics Concern  . Not on file  Social History Narrative   Daughter is 51, Son is 43.  3 grandchildren. Married for 34 years   Social Determinants of Radio broadcast assistant Strain:   . Difficulty of Paying Living Expenses:   Food Insecurity:   . Worried About Charity fundraiser in the Last Year:   . Arboriculturist in the Last Year:   Transportation Needs:   . Film/video editor (Medical):   Marland Kitchen Lack of Transportation (Non-Medical):   Physical Activity:   . Days of  Exercise per Week:   . Minutes of Exercise per Session:   Stress:   . Feeling of Stress :   Social Connections:   . Frequency of Communication with Friends and Family:   . Frequency of Social Gatherings with Friends and Family:   .  Attends Religious Services:   . Active Member of Clubs or Organizations:   . Attends Archivist Meetings:   Marland Kitchen Marital Status:   Intimate Partner Violence:   . Fear of Current or Ex-Partner:   . Emotionally Abused:   Marland Kitchen Physically Abused:   . Sexually Abused:       ROS:  General: Negative for anorexia,  fever, chills, positive fatigue, weakness.  Weight loss, see HPI Eyes: Negative for vision changes.  ENT: Negative for hoarseness, difficulty swallowing , nasal congestion. CV: Negative for chest pain, angina, palpitations, dyspnea on exertion, peripheral edema.  Respiratory: Negative for dyspnea at rest, dyspnea on exertion, cough, sputum, wheezing.  GI: See history of present illness. GU:  Negative for dysuria, hematuria, urinary incontinence, urinary frequency, nocturnal urination.  MS: Positive joint pain, no low back pain.  Derm: Negative for rash or itching.  Neuro: Negative for weakness, abnormal sensation, seizure, frequent headaches, memory loss, confusion.  Psych: Negative for anxiety, depression, suicidal ideation, hallucinations.  Endo: See HPI Heme: Negative for bruising or bleeding. Allergy: Negative for rash or hives.    Physical Examination:  BP 118/74   Pulse 74   Temp (!) 97.3 F (36.3 C)   Ht _0  (1.753 m)   Wt 212 lb 9.6 oz (96.4 kg)   BMI 31.40 kg/m    General: Well-nourished, well-developed in no acute distress.  Head: Normocephalic, atraumatic.   Eyes: Conjunctiva pink, no icterus. Mouth: masked Neck: Supple without thyromegaly, masses, or lymphadenopathy.  Lungs: Clear to auscultation bilaterally.  Heart: Regular rate and rhythm, no murmurs rubs or gallops.  Abdomen: Bowel sounds are normal, nontender,  nondistended, no hepatosplenomegaly or masses, no abdominal bruits or    hernia , no rebound or guarding.   Rectal: not performed Extremities: No lower extremity edema. No clubbing or deformities.  Neuro: Alert and oriented x 4 , grossly normal neurologically.  Skin: Warm and dry, no rash or jaundice.   Psych: Alert and cooperative, normal mood and affect.  Labs: Lab Results  Component Value Date   CREATININE 1.65 (H) 08/16/2019   BUN 32 (H) 08/16/2019   NA 134 (L) 08/16/2019   K 4.0 08/16/2019   CL 105 08/16/2019   CO2 21 (L) 08/16/2019   Lab Results  Component Value Date   ALT 16 08/12/2019   AST 19 08/12/2019   ALKPHOS 40 08/12/2019   BILITOT 0.5 08/12/2019   Lab Results  Component Value Date   WBC 16.6 (H) 08/16/2019   HGB 9.9 (L) 08/16/2019   HCT 30.8 (L) 08/16/2019   MCV 93.6 08/16/2019   PLT 354 08/16/2019    Labs dated 05/15/2019: A1c 5.8, TSH 3.39, white blood cell count 6800, hemoglobin 8.3, hematocrit 24.9, platelets 401,000, glucose 63, BUN 37, creatinine 2.12, albumin 3.4, total bilirubin 0.5, alk phos 44, AST 21, ALT 15  Imaging Studies: No results found.  Impression/plan:  Pleasant 64 year old gentleman presenting for further evaluation of anemia.  Reported drop from hemoglobin of over 12.4 in March to 8.3 in July.  Iron indices unknown.  Denies overt GI bleeding.  Previous evaluation by hematology 2014 when he had significant anemia, felt to be anemia of chronic disease.  Colonoscopy up-to-date, December 2019 as outlined above.  Given symptoms of nausea, diminished appetite, abdominal discomfort in the setting of NSAID use, would offer upper endoscopy as next step to evaluate his as well as his symptoms.  EGD with Dr. Gala Romney soon as possible. ASA II.  I have discussed the risks, alternatives, benefits with regards to but not limited to the risk of reaction to medication, bleeding, infection, perforation and the patient is agreeable to proceed. Written consent to  be obtained.  He will have updated labs including CBC, iron/TIBC/ferritin in 2 weeks with his medical provider that comes to his place of employment.  He will have labs forwarded to me.  Start omeprazole 20 mg daily before breakfast.  Stop Aleve products for now at least upper endoscopy findings.  Further recommendations to follow.

## 2020-05-20 NOTE — Telephone Encounter (Signed)
Pre-op/covid test appt scheduled for  8/5 at at 2:45pm  Called pt and left detail message on named VM with appt details

## 2020-05-20 NOTE — Patient Instructions (Addendum)
1. Upper endoscopy as scheduled. See separate instructions.  2. Start omeprazole 20mg  daily before breakfast.  3. Stop Aleve products until your endoscopy is completed. Further recommendations to follow.  4. Have labs done in two weeks. Take our orders to your work provider.

## 2020-05-20 NOTE — Progress Notes (Signed)
Primary Care Physician:  Everardo Beals, NP  Primary Gastroenterologist:  Garfield Cornea, MD   Chief Complaint  Patient presents with  . Anemia    TCS done 2019. Does not have dark/bloody stools    HPI:  Bryan Atkinson is a 64 y.o. male here for further evaluation of anemia at the request of Everardo Beals, NP  Patient states for the past 3 weeks he has had significant fatigue.  No dizziness, shortness of breath, chest pain.  He has noted change in his appetite which has been poor.  Some nausea but no vomiting.  Little generalized discomfort but seems to be better with meals.  Bowel movements regular.  No blood in stool or melena.  No significant nosebleeds or gross hematuria.  Occasional heartburn.  Recently started ferrous sulfate 325 mg daily.  No longer on PPI.  He takes 2 Aleve PMs every night.  No aspirin powders.  He has a prescription for naproxen which he takes only as needed but not on a daily basis.  Hemoglobin reportedly 12.4 in March 2021, down to 8.18 April 2020.  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  Patient seen by hematology remotely in 2016 for anemia.  Anemia felt to be due to chronic kidney disease, anemia of chronic disease, hypogonadism.  Never required blood transfusions or iron infusions.    Wt Readings from Last 3 Encounters:  05/20/20 212 lb 9.6 oz (96.4 kg)  08/15/19 224 lb 5 oz (101.7 kg)  08/12/19 224 lb 5 oz (101.7 kg)     Current Outpatient Medications  Medication Sig Dispense Refill  . allopurinol (ZYLOPRIM) 300 MG tablet Take 300 mg by mouth daily.    Marland Kitchen amLODipine (NORVASC) 10 MG tablet Take 10 mg by mouth daily.     Marland Kitchen Apple Cider Vinegar 500 MG TABS Take 500 mg by mouth daily.     Marland Kitchen atenolol (TENORMIN) 50 MG tablet Take 50 mg by mouth 2 (two) times daily.     Marland Kitchen Co-Enzyme Q10 100 MG CAPS Take 100 mg by mouth daily.     . Cranberry 500 MG TABS Take 500  mg by mouth daily.    . fenofibrate (TRICOR) 145 MG tablet Take 145 mg by mouth daily.    . ferrous sulfate 325 (65 FE) MG tablet Take 325 mg by mouth daily with breakfast.    . Flaxseed, Linseed, (FLAXSEED OIL) 1000 MG CAPS Take 1,000 mg by mouth daily.    Marland Kitchen glucose blood test strip 1 each by Other route as needed for other. Use as instructed    . Krill Oil 500 MG CAPS Take 500 mg by mouth 2 (two) times a day.    . lisinopril-hydrochlorothiazide (PRINZIDE,ZESTORETIC) 20-25 MG per tablet Take 1 tablet by mouth daily.     . metFORMIN (GLUCOPHAGE) 1000 MG tablet Take 1,000 mg by mouth 2 (two) times daily.  5  . Multiple Vitamin (MULTIVITAMIN) tablet Take 1 tablet by mouth daily.    . naproxen (NAPROSYN) 500 MG tablet Take 500 mg by mouth daily as needed for moderate pain.     . niacin (NIASPAN) 1000 MG CR tablet Take 1,000 mg by mouth at bedtime.    . pravastatin (PRAVACHOL) 40 MG tablet Take 40 mg by mouth at bedtime.     . Turmeric 500 MG CAPS Take 500 mg by mouth daily.    Marland Kitchen  No current facility-administered medications for this visit.    Allergies as of 05/20/2020  . (No Known Allergies)    Past Medical History:  Diagnosis Date  . Arthritis   . Diabetes mellitus without complication ([place])   . Gout   . History of kidney stones   . Hyperlipidemia   . Hypertension     Past Surgical History:  Procedure Laterality Date  . CATARACT EXTRACTION W/PHACO Right 05/13/2019   Procedure: CATARACT EXTRACTION PHACO AND INTRAOCULAR LENS PLACEMENT (IOC);  Surgeon: Baruch Goldmann, MD;  Location: [facility];  Service: Ophthalmology;  Laterality: Right;  CDE: 10.31  . CATARACT EXTRACTION W/PHACO Left 05/27/2019   Procedure: CATARACT EXTRACTION PHACO AND INTRAOCULAR LENS PLACEMENT (IOC);  Surgeon: Baruch Goldmann, MD;  Location: [facility];  Service: Ophthalmology;  Laterality: Left;  CDE: 6.49  . COLONOSCOPY     times two 2002 ,2007  . COLONOSCOPY N/A 10/07/2013   Procedure: COLONOSCOPY;  Surgeon:  Daneil Dolin, MD;  Location: [facility];  Service: Endoscopy;  Laterality: N/A;  8:30 AM  . COLONOSCOPY N/A 10/05/2018   Dr. Gala Romney: Diverticulosis.  Next colonoscopy 5 years due to first-degree relative with colon cancer  . JOINT REPLACEMENT    . KNEE ARTHROPLASTY Left 06/27/2019   Procedure: COMPUTER ASSISTED TOTAL KNEE ARTHROPLASTY;  Surgeon: Rod Can, MD;  Location: [facility];  Service: Orthopedics;  Laterality: Left;  . KNEE ARTHROPLASTY Right 08/15/2019   Procedure: COMPUTER ASSISTED TOTAL KNEE ARTHROPLASTY;  Surgeon: Rod Can, MD;  Location: [facility];  Service: Orthopedics;  Laterality: Right;  . LEG SURGERY Right early 67's    Family History  Problem Relation Age of Onset  . Colon cancer Mother        She died from her malignancy, 27  . Lung cancer Father        Died from lung cancer, age 20  . Emphysema Brother        Died at 56 from lung disease    Social History   Socioeconomic History  . Marital status: Married    Spouse name: Not on file  . Number of children: Not on file  . Years of education: Not on file  . Highest education level: Not on file  Occupational History  . 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 Smoking status: Never Smoker  . Smokeless tobacco: Never Used  Vaping Use  . Vaping Use: Never used  Substance and Sexual Activity  . Alcohol use: No  . Drug use: No  . Sexual activity: Yes  Other Topics Concern  . Not on file  Social History Narrative   Daughter is 51, Son is 43.  3 grandchildren. Married for 34 years   Social Determinants of Radio broadcast assistant Strain:   . Difficulty of Paying Living Expenses:   Food Insecurity:   . Worried About Charity fundraiser in the Last Year:   . Arboriculturist in the Last Year:   Transportation Needs:   . Film/video editor (Medical):   Marland Kitchen Lack of Transportation (Non-Medical):   Physical Activity:   . Days of  Exercise per Week:   . Minutes of Exercise per Session:   Stress:   . Feeling of Stress :   Social Connections:   . Frequency of Communication with Friends and Family:   . Frequency of Social Gatherings with Friends and Family:   .  Attends Religious Services:   . Active Member of Clubs or Organizations:   . Attends Archivist Meetings:   Marland Kitchen Marital Status:   Intimate Partner Violence:   . Fear of Current or Ex-Partner:   . Emotionally Abused:   Marland Kitchen Physically Abused:   . Sexually Abused:       ROS:  General: Negative for anorexia,  fever, chills, positive fatigue, weakness.  Weight loss, see HPI Eyes: Negative for vision changes.  ENT: Negative for hoarseness, difficulty swallowing , nasal congestion. CV: Negative for chest pain, angina, palpitations, dyspnea on exertion, peripheral edema.  Respiratory: Negative for dyspnea at rest, dyspnea on exertion, cough, sputum, wheezing.  GI: See history of present illness. GU:  Negative for dysuria, hematuria, urinary incontinence, urinary frequency, nocturnal urination.  MS: Positive joint pain, no low back pain.  Derm: Negative for rash or itching.  Neuro: Negative for weakness, abnormal sensation, seizure, frequent headaches, memory loss, confusion.  Psych: Negative for anxiety, depression, suicidal ideation, hallucinations.  Endo: See HPI Heme: Negative for bruising or bleeding. Allergy: Negative for rash or hives.    Physical Examination:  BP 118/74   Pulse 74   Temp (!) 97.3 F (36.3 C)   Ht _0  (1.753 m)   Wt 212 lb 9.6 oz (96.4 kg)   BMI 31.40 kg/m    General: Well-nourished, well-developed in no acute distress.  Head: Normocephalic, atraumatic.   Eyes: Conjunctiva pink, no icterus. Mouth: masked Neck: Supple without thyromegaly, masses, or lymphadenopathy.  Lungs: Clear to auscultation bilaterally.  Heart: Regular rate and rhythm, no murmurs rubs or gallops.  Abdomen: Bowel sounds are normal, nontender,  nondistended, no hepatosplenomegaly or masses, no abdominal bruits or    hernia , no rebound or guarding.   Rectal: not performed Extremities: No lower extremity edema. No clubbing or deformities.  Neuro: Alert and oriented x 4 , grossly normal neurologically.  Skin: Warm and dry, no rash or jaundice.   Psych: Alert and cooperative, normal mood and affect.  Labs: Lab Results  Component Value Date   CREATININE 1.65 (H) 08/16/2019   BUN 32 (H) 08/16/2019   NA 134 (L) 08/16/2019   K 4.0 08/16/2019   CL 105 08/16/2019   CO2 21 (L) 08/16/2019   Lab Results  Component Value Date   ALT 16 08/12/2019   AST 19 08/12/2019   ALKPHOS 40 08/12/2019   BILITOT 0.5 08/12/2019   Lab Results  Component Value Date   WBC 16.6 (H) 08/16/2019   HGB 9.9 (L) 08/16/2019   HCT 30.8 (L) 08/16/2019   MCV 93.6 08/16/2019   PLT 354 08/16/2019    Labs dated 05/15/2019: A1c 5.8, TSH 3.39, white blood cell count 6800, hemoglobin 8.3, hematocrit 24.9, platelets 401,000, glucose 63, BUN 37, creatinine 2.12, albumin 3.4, total bilirubin 0.5, alk phos 44, AST 21, ALT 15  Imaging Studies: No results found.  Impression/plan:  Pleasant 64 year old gentleman presenting for further evaluation of anemia.  Reported drop from hemoglobin of over 12.4 in March to 8.3 in July.  Iron indices unknown.  Denies overt GI bleeding.  Previous evaluation by hematology 2014 when he had significant anemia, felt to be anemia of chronic disease.  Colonoscopy up-to-date, December 2019 as outlined above.  Given symptoms of nausea, diminished appetite, abdominal discomfort in the setting of NSAID use, would offer upper endoscopy as next step to evaluate his as well as his symptoms.  EGD with Dr. Gala Romney soon as possible. ASA II.  I have discussed the risks, alternatives, benefits with regards to but not limited to the risk of reaction to medication, bleeding, infection, perforation and the patient is agreeable to proceed. Written consent to  be obtained.  He will have updated labs including CBC, iron/TIBC/ferritin in 2 weeks with his medical provider that comes to his place of employment.  He will have labs forwarded to me.  Start omeprazole 20 mg daily before breakfast.  Stop Aleve products for now at least upper endoscopy findings.  Further recommendations to follow.

## 2020-05-21 ENCOUNTER — Other Ambulatory Visit (HOSPITAL_COMMUNITY)
Admission: RE | Admit: 2020-05-21 | Discharge: 2020-05-21 | Disposition: A | Discharge status: Home / Self Care | Payer: 59 | Source: Ambulatory Visit | Attending: Internal Medicine | Admitting: Internal Medicine

## 2020-05-21 ENCOUNTER — Other Ambulatory Visit: Payer: Self-pay

## 2020-05-21 ENCOUNTER — Encounter (HOSPITAL_COMMUNITY)
Admission: RE | Admit: 2020-05-21 | Discharge: 2020-05-21 | Disposition: A | Discharge status: Home / Self Care | Payer: 59 | Source: Ambulatory Visit | Attending: Internal Medicine | Admitting: Internal Medicine

## 2020-05-21 DIAGNOSIS — Z20822 Contact with and (suspected) exposure to covid-19: Secondary | ICD-10-CM | POA: Insufficient documentation

## 2020-05-21 DIAGNOSIS — Z01812 Encounter for preprocedural laboratory examination: Secondary | ICD-10-CM | POA: Diagnosis present

## 2020-05-21 LAB — CBC WITH DIFFERENTIAL/PLATELET
Abs Immature Granulocytes: 0.17 10*3/uL — ABNORMAL HIGH (ref 0.00–0.07)
Basophils Absolute: 0 10*3/uL (ref 0.0–0.1)
Basophils Relative: 0 %
Eosinophils Absolute: 0.1 10*3/uL (ref 0.0–0.5)
Eosinophils Relative: 1 %
HCT: 22.8 % — ABNORMAL LOW (ref 39.0–52.0)
Hemoglobin: 7.5 g/dL — ABNORMAL LOW (ref 13.0–17.0)
Immature Granulocytes: 2 %
Lymphocytes Relative: 14 %
Lymphs Abs: 1.5 10*3/uL (ref 0.7–4.0)
MCH: 29.2 pg (ref 26.0–34.0)
MCHC: 32.9 g/dL (ref 30.0–36.0)
MCV: 88.7 fL (ref 80.0–100.0)
Monocytes Absolute: 0.8 10*3/uL (ref 0.1–1.0)
Monocytes Relative: 7 %
Neutro Abs: 8.6 10*3/uL — ABNORMAL HIGH (ref 1.7–7.7)
Neutrophils Relative %: 76 %
Platelets: 573 10*3/uL — ABNORMAL HIGH (ref 150–400)
RBC: 2.57 MIL/uL — ABNORMAL LOW (ref 4.22–5.81)
RDW: 13.2 % (ref 11.5–15.5)
WBC: 11.2 10*3/uL — ABNORMAL HIGH (ref 4.0–10.5)
nRBC: 0 % (ref 0.0–0.2)

## 2020-05-21 LAB — BASIC METABOLIC PANEL
Anion gap: 10 (ref 5–15)
BUN: 30 mg/dL — ABNORMAL HIGH (ref 8–23)
CO2: 18 mmol/L — ABNORMAL LOW (ref 22–32)
Calcium: 8.7 mg/dL — ABNORMAL LOW (ref 8.9–10.3)
Chloride: 92 mmol/L — ABNORMAL LOW (ref 98–111)
Creatinine, Ser: 2.28 mg/dL — ABNORMAL HIGH (ref 0.61–1.24)
GFR calc Af Amer: 34 mL/min — ABNORMAL LOW (ref >60)
GFR calc non Af Amer: 30 mL/min — ABNORMAL LOW (ref >60)
Glucose, Bld: 115 mg/dL — ABNORMAL HIGH (ref 70–99)
Potassium: 4 mmol/L (ref 3.5–5.1)
Sodium: 120 mmol/L — ABNORMAL LOW (ref 135–145)

## 2020-05-21 LAB — FERRITIN: Ferritin: 127 ng/mL (ref 24–336)

## 2020-05-21 LAB — IRON AND TIBC
Iron: 23 ug/dL — ABNORMAL LOW (ref 45–182)
Saturation Ratios: 7 % — ABNORMAL LOW (ref 17.9–39.5)
TIBC: 328 ug/dL (ref 250–450)
UIBC: 305 ug/dL

## 2020-05-21 NOTE — Progress Notes (Signed)
CC'ED TO PCP 

## 2020-05-21 NOTE — Patient Instructions (Signed)
Preston Garabedian Rathmann  05/21/2020     @PREFPERIOPPHARMACY @   Your procedure is scheduled on  05/25/2020.  Report to [facility] at  1115  A.M.  Call this number if you have problems the morning of surgery:  [phone]   Remember:  Follow the diet instructions given to you by Dr Roseanne Kaufman office.                       Take these medicines the morning of surgery with A SIP OF WATER  Allopurinol, amlodipine, atenolol, prilosec.    Do not wear jewelry, make-up or nail polish.  Do not wear lotions, powders, or perfumes. Please wear deodorant and brush your teeth.  Do not shave 48 hours prior to surgery.  Men may shave face and neck.  Do not bring valuables to the hospital.  [facility] is not responsible for any belongings or valuables.  Contacts, dentures or bridgework may not be worn into surgery.  Leave your suitcase in the car.  After surgery it may be brought to your room.  For patients admitted to the hospital, discharge time will be determined by your treatment team.  Patients discharged the day of surgery will not be allowed to drive home.   Name and phone number of your driver:   family Special instructions:  DO NOT smoke the morning of your procedure.  Please read over the following fact sheets that you were given. Anesthesia Post-op Instructions and Care and Recovery After Surgery       Upper Endoscopy, Adult, Care After This sheet gives you information about how to care for yourself after your procedure. Your health care provider may also give you more specific instructions. If you have problems or questions, contact your health care provider. What can I expect after the procedure? After the procedure, it is common to have:  A sore throat.  Mild stomach pain or discomfort.  Bloating.  Nausea. Follow these instructions at home:   Follow instructions from your health care provider about what to eat or drink after your procedure.  Return to your normal  activities as told by your health care provider. Ask your health care provider what activities are safe for you.  Take over-the-counter and prescription medicines only as told by your health care provider.  Do not drive for 24 hours if you were given a sedative during your procedure.  Keep all follow-up visits as told by your health care provider. This is important. Contact a health care provider if you have:  A sore throat that lasts longer than one day.  Trouble swallowing. Get help right away if:  You vomit blood or your vomit looks like coffee grounds.  You have: ? A fever. ? Bloody, black, or tarry stools. ? A severe sore throat or you cannot swallow. ? Difficulty breathing. ? Severe pain in your chest or abdomen. Summary  After the procedure, it is common to have a sore throat, mild stomach discomfort, bloating, and nausea.  Do not drive for 24 hours if you were given a sedative during the procedure.  Follow instructions from your health care provider about what to eat or drink after your procedure.  Return to your normal activities as told by your health care provider. This information is not intended to replace advice given to you by your health care provider. Make sure you discuss any questions you have with your health care provider. Document Revised:  03/27/2018 Document Reviewed: 03/05/2018 Elsevier Patient Education  [place] After These instructions provide you with information about caring for yourself after your procedure. Your health care provider may also give you more specific instructions. Your treatment has been planned according to current medical practices, but problems sometimes occur. Call your health care provider if you have any problems or questions after your procedure. What can I expect after the procedure? After your procedure, you may:  Feel sleepy for several hours.  Feel clumsy and have poor balance  for several hours.  Feel forgetful about what happened after the procedure.  Have poor judgment for several hours.  Feel nauseous or vomit.  Have a sore throat if you had a breathing tube during the procedure. Follow these instructions at home: For at least 24 hours after the procedure:      Have a responsible adult stay with you. It is important to have someone help care for you until you are awake and alert.  Rest as needed.  Do not: ? Participate in activities in which you could fall or become injured. ? Drive. ? Use heavy machinery. ? Drink alcohol. ? Take sleeping pills or medicines that cause drowsiness. ? Make important decisions or sign legal documents. ? Take care of children on your own. Eating and drinking  Follow the diet that is recommended by your health care provider.  If you vomit, drink water, juice, or soup when you can drink without vomiting.  Make sure you have little or no nausea before eating solid foods. General instructions  Take over-the-counter and prescription medicines only as told by your health care provider.  If you have sleep apnea, surgery and certain medicines can increase your risk for breathing problems. Follow instructions from your health care provider about wearing your sleep device: ? Anytime you are sleeping, including during daytime naps. ? While taking prescription pain medicines, sleeping medicines, or medicines that make you drowsy.  If you smoke, do not smoke without supervision.  Keep all follow-up visits as told by your health care provider. This is important. Contact a health care provider if:  You keep feeling nauseous or you keep vomiting.  You feel light-headed.  You develop a rash.  You have a fever. Get help right away if:  You have trouble breathing. Summary  For several hours after your procedure, you may feel sleepy and have poor judgment.  Have a responsible adult stay with you for at least 24 hours  or until you are awake and alert. This information is not intended to replace advice given to you by your health care provider. Make sure you discuss any questions you have with your health care provider. Document Revised: 01/01/2018 Document Reviewed: 01/24/2016 Elsevier Patient Education  [place].

## 2020-05-21 NOTE — Progress Notes (Signed)
Please excuse Bryan Atkinson from work 05/21/2020 until 05/27/2020 due to quarantine after Covid test for upcoming procedure on Monday.   Patient will receive sedation on Monday during procedure and will be unable to drive or operate machinery for 24 hours after procedure

## 2020-05-22 LAB — SARS CORONAVIRUS 2 (TAT 6-24 HRS): SARS Coronavirus 2: NEGATIVE

## 2020-05-22 NOTE — Progress Notes (Signed)
Dr Charna Elizabeth notified of Hgb-7.5, Hct-22.8, and Platelets 573. No new orders given.

## 2020-05-25 ENCOUNTER — Encounter (HOSPITAL_COMMUNITY): Payer: Self-pay | Admitting: Internal Medicine

## 2020-05-25 ENCOUNTER — Encounter (HOSPITAL_COMMUNITY)
Admission: RE | Disposition: A | Discharge status: Home / Self Care | Payer: Self-pay | Source: Home / Self Care | Attending: Internal Medicine

## 2020-05-25 ENCOUNTER — Ambulatory Visit (HOSPITAL_COMMUNITY): Payer: 59 | Admitting: Anesthesiology

## 2020-05-25 ENCOUNTER — Other Ambulatory Visit: Payer: Self-pay

## 2020-05-25 ENCOUNTER — Ambulatory Visit (HOSPITAL_COMMUNITY)
Admission: RE | Admit: 2020-05-25 | Discharge: 2020-05-25 | Disposition: A | Discharge status: Home / Self Care | Payer: 59 | Attending: Internal Medicine | Admitting: Internal Medicine

## 2020-05-25 DIAGNOSIS — M109 Gout, unspecified: Secondary | ICD-10-CM | POA: Diagnosis not present

## 2020-05-25 DIAGNOSIS — K449 Diaphragmatic hernia without obstruction or gangrene: Secondary | ICD-10-CM | POA: Diagnosis not present

## 2020-05-25 DIAGNOSIS — Z800 Family history of malignant neoplasm of digestive organs: Secondary | ICD-10-CM | POA: Diagnosis not present

## 2020-05-25 DIAGNOSIS — R1013 Epigastric pain: Secondary | ICD-10-CM | POA: Insufficient documentation

## 2020-05-25 DIAGNOSIS — Z7984 Long term (current) use of oral hypoglycemic drugs: Secondary | ICD-10-CM | POA: Diagnosis not present

## 2020-05-25 DIAGNOSIS — M1990 Unspecified osteoarthritis, unspecified site: Secondary | ICD-10-CM | POA: Diagnosis not present

## 2020-05-25 DIAGNOSIS — D638 Anemia in other chronic diseases classified elsewhere: Secondary | ICD-10-CM | POA: Diagnosis not present

## 2020-05-25 DIAGNOSIS — N189 Chronic kidney disease, unspecified: Secondary | ICD-10-CM | POA: Insufficient documentation

## 2020-05-25 DIAGNOSIS — Z79899 Other long term (current) drug therapy: Secondary | ICD-10-CM | POA: Diagnosis not present

## 2020-05-25 DIAGNOSIS — K259 Gastric ulcer, unspecified as acute or chronic, without hemorrhage or perforation: Secondary | ICD-10-CM

## 2020-05-25 DIAGNOSIS — E1122 Type 2 diabetes mellitus with diabetic chronic kidney disease: Secondary | ICD-10-CM | POA: Diagnosis not present

## 2020-05-25 DIAGNOSIS — K319 Disease of stomach and duodenum, unspecified: Secondary | ICD-10-CM | POA: Diagnosis not present

## 2020-05-25 DIAGNOSIS — E785 Hyperlipidemia, unspecified: Secondary | ICD-10-CM | POA: Insufficient documentation

## 2020-05-25 DIAGNOSIS — Z801 Family history of malignant neoplasm of trachea, bronchus and lung: Secondary | ICD-10-CM | POA: Diagnosis not present

## 2020-05-25 DIAGNOSIS — I129 Hypertensive chronic kidney disease with stage 1 through stage 4 chronic kidney disease, or unspecified chronic kidney disease: Secondary | ICD-10-CM | POA: Insufficient documentation

## 2020-05-25 HISTORY — PX: ESOPHAGOGASTRODUODENOSCOPY (EGD) WITH PROPOFOL: SHX5813

## 2020-05-25 HISTORY — PX: BIOPSY: SHX5522

## 2020-05-25 LAB — GLUCOSE, CAPILLARY: Glucose-Capillary: 97 mg/dL (ref 70–99)

## 2020-05-25 SURGERY — ESOPHAGOGASTRODUODENOSCOPY (EGD) WITH PROPOFOL
Anesthesia: General

## 2020-05-25 MED ORDER — LIDOCAINE VISCOUS HCL 2 % MT SOLN
OROMUCOSAL | Status: AC
  Filled 2020-05-25: qty 15

## 2020-05-25 MED ORDER — LACTATED RINGERS IV SOLN: INTRAVENOUS | Status: DC | PRN

## 2020-05-25 MED ORDER — LACTATED RINGERS IV SOLN: INTRAVENOUS | Status: DC

## 2020-05-25 MED ORDER — GLYCOPYRROLATE 0.2 MG/ML IJ SOLN
INTRAMUSCULAR | Status: DC | PRN
  Administered 2020-05-25: .2 mg via INTRAVENOUS

## 2020-05-25 MED ORDER — PROPOFOL 10 MG/ML IV BOLUS
INTRAVENOUS | Status: DC | PRN
  Administered 2020-05-25 (×2): 30 mg via INTRAVENOUS
  Administered 2020-05-25: 100 mg via INTRAVENOUS

## 2020-05-25 MED ORDER — CHLORHEXIDINE GLUCONATE CLOTH 2 % EX PADS: 6.0000 | MEDICATED_PAD | Freq: Once | CUTANEOUS | Status: DC

## 2020-05-25 MED ORDER — STERILE WATER FOR IRRIGATION IR SOLN
Status: DC | PRN
  Administered 2020-05-25: 1.5 mL

## 2020-05-25 MED ORDER — LIDOCAINE VISCOUS HCL 2 % MT SOLN: 15.0000 mL | Freq: Once | OROMUCOSAL | Status: DC

## 2020-05-25 MED ORDER — GLYCOPYRROLATE 0.2 MG/ML IJ SOLN: 0.2000 mg | Freq: Once | INTRAMUSCULAR | Status: DC

## 2020-05-25 NOTE — Progress Notes (Signed)
Please excuse Bryan Atkinson from work until Wednesday August 11,2021. He cannot drive, operate heavy machinery, or sign legal documents for 24 hours, until after 3:30pm on Tuesday May 26, 2020.

## 2020-05-25 NOTE — Discharge Instructions (Signed)
EGD Discharge instructions Please read the instructions outlined below and refer to this sheet in the next few weeks. These discharge instructions provide you with general information on caring for yourself after you leave the hospital. Your doctor may also give you specific instructions. While your treatment has been planned according to the most current medical practices available, unavoidable complications occasionally occur. If you have any problems or questions after discharge, please call your doctor. ACTIVITY  You may resume your regular activity but move at a slower pace for the next 24 hours.   Take frequent rest periods for the next 24 hours.   Walking will help expel (get rid of) the air and reduce the bloated feeling in your abdomen.   No driving for 24 hours (because of the anesthesia (medicine) used during the test).   You may shower.   Do not sign any important legal documents or operate any machinery for 24 hours (because of the anesthesia used during the test).  NUTRITION  Drink plenty of fluids.   You may resume your normal diet.   Begin with a light meal and progress to your normal diet.   Avoid alcoholic beverages for 24 hours or as instructed by your caregiver.  MEDICATIONS  You may resume your normal medications unless your caregiver tells you otherwise.  WHAT YOU CAN EXPECT TODAY  You may experience abdominal discomfort such as a feeling of fullness or "gas" pains.  FOLLOW-UP  Your doctor will discuss the results of your test with you.  SEEK IMMEDIATE MEDICAL ATTENTION IF ANY OF THE FOLLOWING OCCUR:  Excessive nausea (feeling sick to your stomach) and/or vomiting.   Severe abdominal pain and distention (swelling).   Trouble swallowing.   Temperature over 101 F (37.8 C).   Rectal bleeding or vomiting of blood.   You have 2 gastric ulcers-one of them was large -likely caused by Aleve.  Stop Aleve and all NSAID medications  Begin Protonix 40 mg  twice daily.  Prescription provided  You do not need to Prilosec.  Biopsies of the ulcer taken today.  Further recommendations to follow pending review of report  Office visit in 3 months.  You will need a repeat EGD at that time to verify healing.  At patient request I called Abby Stines at [phone] and reviewed results     Monitored Anesthesia Care, Care After These instructions provide you with information about caring for yourself after your procedure. Your health care provider may also give you more specific instructions. Your treatment has been planned according to current medical practices, but problems sometimes occur. Call your health care provider if you have any problems or questions after your procedure. What can I expect after the procedure? After your procedure, you may:  Feel sleepy for several hours.  Feel clumsy and have poor balance for several hours.  Feel forgetful about what happened after the procedure.  Have poor judgment for several hours.  Feel nauseous or vomit.  Have a sore throat if you had a breathing tube during the procedure. Follow these instructions at home: For at least 24 hours after the procedure:      Have a responsible adult stay with you. It is important to have someone help care for you until you are awake and alert.  Rest as needed.  Do not: ? Participate in activities in which you could fall or become injured. ? Drive. ? Use heavy machinery. ? Drink alcohol. ? Take sleeping pills or medicines that cause  drowsiness. ? Make important decisions or sign legal documents. ? Take care of children on your own. Eating and drinking  Follow the diet that is recommended by your health care provider.  If you vomit, drink water, juice, or soup when you can drink without vomiting.  Make sure you have little or no nausea before eating solid foods. General instructions  Take over-the-counter and prescription medicines only as  told by your health care provider.  If you have sleep apnea, surgery and certain medicines can increase your risk for breathing problems. Follow instructions from your health care provider about wearing your sleep device: ? Anytime you are sleeping, including during daytime naps. ? While taking prescription pain medicines, sleeping medicines, or medicines that make you drowsy.  If you smoke, do not smoke without supervision.  Keep all follow-up visits as told by your health care provider. This is important. Contact a health care provider if:  You keep feeling nauseous or you keep vomiting.  You feel light-headed.  You develop a rash.  You have a fever. Get help right away if:  You have trouble breathing. Summary  For several hours after your procedure, you may feel sleepy and have poor judgment.  Have a responsible adult stay with you for at least 24 hours or until you are awake and alert. This information is not intended to replace advice given to you by your health care provider. Make sure you discuss any questions you have with your health care provider. Document Revised: 01/01/2018 Document Reviewed: 01/24/2016 Elsevier Patient Education  [place].

## 2020-05-25 NOTE — Interval H&P Note (Signed)
History and Physical Interval Note:  05/25/2020 2:41 PM  Bryan Atkinson  has presented today for surgery, with the diagnosis of anemia, nausea, poor appetite, abd pain.  The various methods of treatment have been discussed with the patient and family. After consideration of risks, benefits and other options for treatment, the patient has consented to  Procedure(s) with comments: ESOPHAGOGASTRODUODENOSCOPY (EGD) WITH PROPOFOL (N/A) - 12:45pm as a surgical intervention.  The patient's history has been reviewed, patient examined, no change in status, stable for surgery.  I have reviewed the patient's chart and labs.  Questions were answered to the patient's satisfaction.     Bryan Atkinson  Patient states he feels much better already since  he stopped taking Aleve p.m. last week.  Denies dysphagia.  Diagnostic EGD today per plan. The risks, benefits, limitations, alternatives and imponderables have been reviewed with the patient. Potential for esophageal dilation, biopsy, etc. have also been reviewed.  Questions have been answered. All parties agreeable.

## 2020-05-25 NOTE — Anesthesia Preprocedure Evaluation (Signed)
Anesthesia Evaluation  Patient identified by MRN, date of birth, ID band Patient awake    Reviewed: Allergy & Precautions, H&P , NPO status , Patient's Chart, lab work & pertinent test results, reviewed documented beta blocker date and time   Airway Mallampati: II  TM Distance: >3 FB Neck ROM: full    Dental no notable dental hx.    Pulmonary neg pulmonary ROS,    Pulmonary exam normal breath sounds clear to auscultation       Cardiovascular Exercise Tolerance: Good hypertension, negative cardio ROS   Rhythm:regular Rate:Normal     Neuro/Psych negative neurological ROS  negative psych ROS   GI/Hepatic negative GI ROS, Neg liver ROS,   Endo/Other  negative endocrine ROSdiabetes  Renal/GU negative Renal ROS  negative genitourinary   Musculoskeletal negative musculoskeletal ROS (+)   Abdominal   Peds negative pediatric ROS (+)  Hematology  (+) Blood dyscrasia, anemia ,   Anesthesia Other Findings   Reproductive/Obstetrics negative OB ROS                             Anesthesia Physical Anesthesia Plan  ASA: II  Anesthesia Plan: General   Post-op Pain Management:    Induction:   PONV Risk Score and Plan: Propofol infusion  Airway Management Planned:   Additional Equipment:   Intra-op Plan:   Post-operative Plan:   Informed Consent: I have reviewed the patients History and Physical, chart, labs and discussed the procedure including the risks, benefits and alternatives for the proposed anesthesia with the patient or authorized representative who has indicated his/her understanding and acceptance.     Dental Advisory Given  Plan Discussed with: CRNA  Anesthesia Plan Comments:         Anesthesia Quick Evaluation

## 2020-05-25 NOTE — Anesthesia Procedure Notes (Signed)
Date/Time: 05/25/2020 2:48 PM Performed by: Vista Deck, CRNA Pre-anesthesia Checklist: Patient identified, Emergency Drugs available, Suction available, Timeout performed and Patient being monitored Patient Re-evaluated:Patient Re-evaluated prior to induction Oxygen Delivery Method: Nasal Cannula

## 2020-05-25 NOTE — Transfer of Care (Signed)
Immediate Anesthesia Transfer of Care Note  Patient: Bryan Atkinson  Procedure(s) Performed: ESOPHAGOGASTRODUODENOSCOPY (EGD) WITH PROPOFOL (N/A ) BIOPSY  Patient Location: PACU  Anesthesia Type:General  Level of Consciousness: awake, alert  and patient cooperative  Airway & Oxygen Therapy: Patient Spontanous Breathing  Post-op Assessment: Report given to RN and Post -op Vital signs reviewed and stable  Post vital signs: Reviewed and stable  Last Vitals:  Vitals Value Taken Time  BP    Temp 98   Pulse 80 05/25/20 1508  Resp 13 05/25/20 1508  SpO2 100 % 05/25/20 1508  Vitals shown include unvalidated device data.  Last Pain:  Vitals:   05/25/20 1449  TempSrc:   PainSc: 0-No pain      Patients Stated Pain Goal: 5 (70/[id number]/95 [id number])  Complications: No complications documented.

## 2020-05-25 NOTE — Anesthesia Postprocedure Evaluation (Signed)
Anesthesia Post Note  Patient: Ajax Schroll Grimaldo  Procedure(s) Performed: ESOPHAGOGASTRODUODENOSCOPY (EGD) WITH PROPOFOL (N/A ) BIOPSY  Patient location during evaluation: PACU Anesthesia Type: General Level of consciousness: awake and alert and patient cooperative Pain management: satisfactory to patient Vital Signs Assessment: post-procedure vital signs reviewed and stable Respiratory status: spontaneous breathing Cardiovascular status: stable Postop Assessment: no apparent nausea or vomiting Anesthetic complications: no   No complications documented.   Last Vitals:  Vitals:   05/25/20 1322  BP: 131/86  Pulse: 74  Resp: 19  Temp: 36.7 C  SpO2: 99%    Last Pain:  Vitals:   05/25/20 1449  TempSrc:   PainSc: 0-No pain                 Lilymae Swiech

## 2020-05-25 NOTE — Op Note (Signed)
[facility] Patient Name: Bryan Atkinson Procedure Date: 05/25/2020 2:32 PM MRN: [id number] Date of Birth: 1955/12/15 Attending MD: Norvel Richards , MD CSN: [id number] Age: 64 Admit Type: Outpatient Procedure:                Upper GI endoscopy Indications:              Dyspepsia; already feeling much better after                            stopping consumption of Aleve p.m. last week Providers:                Norvel Richards, MD, Charlsie Quest. Theda Sers RN, RN,                            Crystal Page, Nelma Rothman, Technician Referring MD:              Medicines:                Propofol per Anesthesia Complications:            No immediate complications. Estimated Blood Loss:     Estimated blood loss was minimal. Procedure:                Pre-Anesthesia Assessment:                           - Prior to the procedure, a History and Physical                            was performed, and patient medications and                            allergies were reviewed. The patient's tolerance of                            previous anesthesia was also reviewed. The risks                            and benefits of the procedure and the sedation                            options and risks were discussed with the patient.                            All questions were answered, and informed consent                            was obtained. Prior Anticoagulants: The patient has                            taken no previous anticoagulant or antiplatelet                            agents. ASA Grade Assessment: II - A patient with  mild systemic disease. After reviewing the risks                            and benefits, the patient was deemed in                            satisfactory condition to undergo the procedure.                           After obtaining informed consent, the endoscope was                            passed under direct vision. Throughout the                             procedure, the patient's blood pressure, pulse, and                            oxygen saturations were monitored continuously. The                            [id number]-[id number] ([id number]) scope was introduced through the                            mouth, and advanced to the second part of duodenum.                            The upper GI endoscopy was accomplished without                            difficulty. The patient tolerated the procedure                            well. Scope In: 2:55:25 PM Scope Out: 3:01:31 PM Total Procedure Duration: 0 hours 6 minutes 6 seconds  Findings:      The examined esophagus was normal.      A small hiatal hernia was present. Patient had (1) 1.75 cm deep,       benign-appearing prepyloric antral ulcer. No bleeding stigmata. Patient       had adjacent 4 mm ulcer crater. Remainder of gastric mucosa appeared       normal. Patent pylorus.      The duodenal bulb and second portion of the duodenum were normal. The       periphery of the large gastric ulcer was biopsied multiple times without       apparent complication. Minimal bleeding with these maneuvers. Impression:               - Normal esophagus.                           - Small hiatal hernia. 2 gastric ulcers as described                           - Normal duodenal bulb and second portion of the  duodenum.                           -Status post gastric ulcer biopsy Moderate Sedation:      Moderate (conscious) sedation was personally administered by an       anesthesia professional. The following parameters were monitored: oxygen       saturation, heart rate, blood pressure, respiratory rate, EKG, adequacy       of pulmonary ventilation, and response to care. Recommendation:           - Patient has a contact number available for                            emergencies. The signs and symptoms of potential                            delayed complications were  discussed with the                            patient. Return to normal activities tomorrow.                            Written discharge instructions were provided to the                            patient.                           - Advance diet as tolerated. Absolutely refrain                            from taking all forms of nonsteroidal agents.                            Protonix 40 mg twice daily. Follow-up on pathology.                            Office visit 3 months. Repeat EGD 3 months Procedure Code(s):        --- Professional ---                           [phone], Esophagogastroduodenoscopy, flexible,                            transoral; diagnostic, including collection of                            specimen(s) by brushing or washing, when performed                            (separate procedure) Diagnosis Code(s):        --- Professional ---                           K44.9, Diaphragmatic hernia without obstruction or  gangrene                           R10.13, Epigastric pain CPT copyright 2019 [employer]. All rights reserved. The codes documented in this report are preliminary and upon coder review may  be revised to meet current compliance requirements. Cristopher Estimable. Erika Slaby, MD Norvel Richards, MD 05/25/2020 3:10:56 PM This report has been signed electronically. Number of Addenda: 0

## 2020-05-27 ENCOUNTER — Other Ambulatory Visit: Payer: Self-pay

## 2020-05-27 LAB — SURGICAL PATHOLOGY

## 2020-05-28 ENCOUNTER — Other Ambulatory Visit: Payer: Self-pay

## 2020-05-28 ENCOUNTER — Other Ambulatory Visit: Payer: Self-pay | Admitting: *Deleted

## 2020-05-28 DIAGNOSIS — R899 Unspecified abnormal finding in specimens from other organs, systems and tissues: Secondary | ICD-10-CM

## 2020-05-28 DIAGNOSIS — D649 Anemia, unspecified: Secondary | ICD-10-CM

## 2020-05-29 ENCOUNTER — Encounter (HOSPITAL_COMMUNITY): Payer: Self-pay

## 2020-05-29 ENCOUNTER — Other Ambulatory Visit: Payer: Self-pay

## 2020-05-29 ENCOUNTER — Encounter (HOSPITAL_COMMUNITY): Payer: Self-pay | Admitting: Internal Medicine

## 2020-05-30 ENCOUNTER — Encounter: Payer: Self-pay | Admitting: Internal Medicine

## 2020-06-01 ENCOUNTER — Other Ambulatory Visit: Payer: Self-pay

## 2020-06-01 ENCOUNTER — Encounter (HOSPITAL_COMMUNITY): Payer: Self-pay | Admitting: Hematology

## 2020-06-01 ENCOUNTER — Inpatient Hospital Stay (HOSPITAL_COMMUNITY): Payer: 59

## 2020-06-01 ENCOUNTER — Inpatient Hospital Stay (HOSPITAL_COMMUNITY): Payer: 59 | Attending: Hematology | Admitting: Hematology

## 2020-06-01 VITALS — BP 127/80 | HR 72 | Temp 98.4°F | Resp 16 | Ht 69.0 in | Wt 207.5 lb

## 2020-06-01 DIAGNOSIS — K319 Disease of stomach and duodenum, unspecified: Secondary | ICD-10-CM | POA: Insufficient documentation

## 2020-06-01 DIAGNOSIS — N189 Chronic kidney disease, unspecified: Secondary | ICD-10-CM | POA: Insufficient documentation

## 2020-06-01 DIAGNOSIS — D72829 Elevated white blood cell count, unspecified: Secondary | ICD-10-CM | POA: Insufficient documentation

## 2020-06-01 DIAGNOSIS — Z800 Family history of malignant neoplasm of digestive organs: Secondary | ICD-10-CM | POA: Insufficient documentation

## 2020-06-01 DIAGNOSIS — Z801 Family history of malignant neoplasm of trachea, bronchus and lung: Secondary | ICD-10-CM | POA: Diagnosis not present

## 2020-06-01 DIAGNOSIS — D649 Anemia, unspecified: Secondary | ICD-10-CM

## 2020-06-01 DIAGNOSIS — K449 Diaphragmatic hernia without obstruction or gangrene: Secondary | ICD-10-CM | POA: Insufficient documentation

## 2020-06-01 DIAGNOSIS — I129 Hypertensive chronic kidney disease with stage 1 through stage 4 chronic kidney disease, or unspecified chronic kidney disease: Secondary | ICD-10-CM | POA: Diagnosis not present

## 2020-06-01 LAB — CBC WITH DIFFERENTIAL/PLATELET
Abs Immature Granulocytes: 0.24 10*3/uL — ABNORMAL HIGH (ref 0.00–0.07)
Basophils Absolute: 0 10*3/uL (ref 0.0–0.1)
Basophils Relative: 0 %
Eosinophils Absolute: 0 10*3/uL (ref 0.0–0.5)
Eosinophils Relative: 0 %
HCT: 27.4 % — ABNORMAL LOW (ref 39.0–52.0)
Hemoglobin: 9 g/dL — ABNORMAL LOW (ref 13.0–17.0)
Immature Granulocytes: 2 %
Lymphocytes Relative: 8 %
Lymphs Abs: 1.1 10*3/uL (ref 0.7–4.0)
MCH: 28.9 pg (ref 26.0–34.0)
MCHC: 32.8 g/dL (ref 30.0–36.0)
MCV: 88.1 fL (ref 80.0–100.0)
Monocytes Absolute: 0.9 10*3/uL (ref 0.1–1.0)
Monocytes Relative: 7 %
Neutro Abs: 11.1 10*3/uL — ABNORMAL HIGH (ref 1.7–7.7)
Neutrophils Relative %: 83 %
Platelets: 546 10*3/uL — ABNORMAL HIGH (ref 150–400)
RBC: 3.11 MIL/uL — ABNORMAL LOW (ref 4.22–5.81)
RDW: 13.1 % (ref 11.5–15.5)
WBC: 13.4 10*3/uL — ABNORMAL HIGH (ref 4.0–10.5)
nRBC: 0 % (ref 0.0–0.2)

## 2020-06-01 LAB — VITAMIN B12: Vitamin B-12: 789 pg/mL (ref 180–914)

## 2020-06-01 LAB — RETICULOCYTES
Immature Retic Fract: 19 % — ABNORMAL HIGH (ref 2.3–15.9)
RBC.: 3.11 MIL/uL — ABNORMAL LOW (ref 4.22–5.81)
Retic Count, Absolute: 92.7 10*3/uL (ref 19.0–186.0)
Retic Ct Pct: 3 % (ref 0.4–3.1)

## 2020-06-01 LAB — FOLATE: Folate: 21.8 ng/mL (ref >5.9)

## 2020-06-01 LAB — LACTATE DEHYDROGENASE: LDH: 99 U/L (ref 98–192)

## 2020-06-01 NOTE — Progress Notes (Signed)
[place], [place] [address]   CLINIC:  Medical Oncology/Hematology  Patient Care Team: Everardo Beals, NP as PCP - General [name], Cristopher Estimable, MD as Consulting Physician (Gastroenterology)  CHIEF COMPLAINTS/PURPOSE OF CONSULTATION:  Evaluation of anemia  HISTORY OF PRESENTING ILLNESS:  Bryan Atkinson 64 y.o. male is here because of evaluation of anemia, at the request of Everardo Beals, FNP at [facility]. He had a drop in hemoglobin from 13.9 in 06/25/2019 to 7.5 on 05/21/2020 and his last colonoscopy in 2019 was negative. An EGD was performed on 8/9 by Dr. Gala Romney which showed a single prepyloric gastric ulcer, an ulcer crater, and a small hiatal hernia  Today he reports that he had this issue before, but it resolved on its own. He denies having melena, hematochezia or hematuria. He is yet to be referred to a nephrologist. He denies SOB, CP, or placement of stents in his heart. He has not had any blood transfusions and denies having any pica, F/C, night sweats, or unexpected weight loss. He takes 1 iron tablet daily for over a month now and denies having constipation.  He lives at home and is able to do all of his chores and he has a pet at home. He works as an Producer, television/film/video at [employer]. He is a non-smoker. He denies history of anemia in the family; father had lung cancer. He had his second COVID vaccine on 01/30/2020.   MEDICAL HISTORY:  Past Medical History:  Diagnosis Date  . Arthritis   . Diabetes mellitus without complication ([place])   . Gout   . History of kidney stones   . Hyperlipidemia   . Hypertension     SURGICAL HISTORY: Past Surgical History:  Procedure Laterality Date  . BIOPSY  05/25/2020   Procedure: BIOPSY;  Surgeon: Daneil Dolin, MD;  Location: [facility];  Service: Endoscopy;;  . CATARACT EXTRACTION W/PHACO Right 05/13/2019   Procedure: CATARACT EXTRACTION PHACO AND INTRAOCULAR LENS PLACEMENT  ([place]);  Surgeon: Baruch Goldmann, MD;  Location: [facility] ORS;  Service: Ophthalmology;  Laterality: Right;  CDE: 10.31  . CATARACT EXTRACTION W/PHACO Left 05/27/2019   Procedure: CATARACT EXTRACTION PHACO AND INTRAOCULAR LENS PLACEMENT (IOC);  Surgeon: Baruch Goldmann, MD;  Location: [facility] ORS;  Service: Ophthalmology;  Laterality: Left;  CDE: 6.49  . COLONOSCOPY     times two 2002 ,2007  . COLONOSCOPY N/A 10/07/2013   Procedure: COLONOSCOPY;  Surgeon: Daneil Dolin, MD;  Location: [facility] SU[facility];  Service: Endoscopy;  Laterality: N/A;  8:30 AM  . COLONOSCOPY N/A 10/05/2018   Dr. Gala Romney: Diverticulosis.  Next colonoscopy 5 years due to first-degree relative with colon cancer  . ESOPHAGOGASTRODUODENOSCOPY (EGD) WITH PROPOFOL N/A 05/25/2020   Procedure: ESOPHAGOGASTRODUODENOSCOPY (EGD) WITH PROPOFOL;  Surgeon: Daneil Dolin, MD;  Location: [facility];  Service: Endoscopy;  Laterality: N/A;  12:45pm  . JOINT REPLACEMENT    . KNEE ARTHROPLASTY Left 06/27/2019   Procedure: COMPUTER ASSISTED TOTAL KNEE ARTHROPLASTY;  Surgeon: Rod Can, MD;  Location: [facility] ORS;  Service: Orthopedics;  Laterality: Left;  . KNEE ARTHROPLASTY Right 08/15/2019   Procedure: COMPUTER ASSISTED TOTAL KNEE ARTHROPLASTY;  Surgeon: Rod Can, MD;  Location: [facility] ORS;  Service: Orthopedics;  Laterality: Right;  . LEG SURGERY Right early 73's    SOCIAL HISTORY: Social History   Socioeconomic History  . Marital status: Married    Spouse name: Not on file  . Number of children:  2  . Years of education: Not on file  . Highest education level: Not on file  Occupational History  . 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 Smoking status: Never Smoker  . Smokeless tobacco: Never Used  Vaping Use  . Vaping Use: Never used  Substance and Sexual Activity  . Alcohol use: Yes    Comment: occasionally  . Drug use: No  . Sexual activity: Yes  Other Topics Concern    . Not on file  Social History Narrative   Daughter is 39, Son is 26.  3 grandchildren. Married for 34 years   Social Determinants of Radio broadcast assistant Strain:   . Difficulty of Paying Living Expenses:   Food Insecurity:   . Worried About Charity fundraiser in the Last Year:   . Arboriculturist in the Last Year:   Transportation Needs:   . Film/video editor (Medical):   Marland Kitchen Lack of Transportation (Non-Medical):   Physical Activity:   . Days of Exercise per Week:   . Minutes of Exercise per Session:   Stress:   . Feeling of Stress :   Social Connections:   . Frequency of Communication with Friends and Family:   . Frequency of Social Gatherings with Friends and Family:   . Attends Religious Services:   . Active Member of Clubs or Organizations:   . Attends Archivist Meetings:   Marland Kitchen Marital Status:   Intimate Partner Violence:   . Fear of Current or Ex-Partner:   . Emotionally Abused:   Marland Kitchen Physically Abused:   . Sexually Abused:     FAMILY HISTORY: Family History  Problem Relation Age of Onset  . Colon cancer Mother        She died from her malignancy, 58  . Lung cancer Father        Died from lung cancer, age 60  . Emphysema Brother        Died at 65 from lung disease  . COPD Brother   . Dementia Sister   . Healthy Son   . Healthy Daughter     ALLERGIES:  has No Known Allergies.  MEDICATIONS:  Current Outpatient Medications  Medication Sig Dispense Refill  . allopurinol (ZYLOPRIM) 300 MG tablet Take 300 mg by mouth daily.    Marland Kitchen amLODipine (NORVASC) 10 MG tablet Take 10 mg by mouth daily.     Marland Kitchen Apple Cider Vinegar 500 MG TABS Take 500 mg by mouth daily.     Marland Kitchen aspirin EC 81 MG tablet Take 81 mg by mouth daily. Swallow whole.    Marland Kitchen atenolol (TENORMIN) 50 MG tablet Take 50 mg by mouth 2 (two) times daily.     Marland Kitchen Co-Enzyme Q10 100 MG CAPS Take 100 mg by mouth daily.     . Cranberry 500 MG TABS Take 500 mg by mouth daily.    . fenofibrate  (TRICOR) 145 MG tablet Take 145 mg by mouth daily.    . ferrous sulfate 325 (65 FE) MG tablet Take 325 mg by mouth daily with breakfast.    . Flaxseed, Linseed, (FLAXSEED OIL) 1000 MG CAPS Take 1,000 mg by mouth daily.    Marland Kitchen glucose blood test strip 1 each by Other route as needed for other. Use as instructed    . Krill Oil 500 MG CAPS Take 500 mg by mouth 2 (  two) times a day.    . lisinopril (ZESTRIL) 20 MG tablet Take 20 mg by mouth daily.    . metFORMIN (GLUCOPHAGE) 1000 MG tablet Take 1,000 mg by mouth 2 (two) times daily.  5  . Multiple Vitamin (MULTIVITAMIN) tablet Take 1 tablet by mouth daily.    . niacin (NIASPAN) 1000 MG CR tablet Take 1,000 mg by mouth at bedtime.    . pantoprazole (PROTONIX) 40 MG tablet Take 40 mg by mouth 2 (two) times daily.    . pravastatin (PRAVACHOL) 40 MG tablet Take 40 mg by mouth at bedtime.     . Turmeric 500 MG CAPS Take 500 mg by mouth daily.     No current facility-administered medications for this visit.    REVIEW OF SYSTEMS:   Review of Systems  Constitutional: Positive for appetite change (moderately decreased) and fatigue (moderate). Negative for chills, diaphoresis, fever and unexpected weight change.  Respiratory: Positive for cough. Negative for shortness of breath.   Cardiovascular: Negative for leg swelling.  Gastrointestinal: Negative for blood in stool and constipation.  Genitourinary: Negative for hematuria.   Psychiatric/Behavioral: Positive for sleep disturbance.  All other systems reviewed and are negative.    PHYSICAL EXAMINATION: ECOG PERFORMANCE STATUS: 1 - Symptomatic but completely ambulatory  Vitals:   06/01/20 0809  BP: 127/80  Pulse: 72  Resp: 16  Temp: 98.4 F (36.9 C)  SpO2: 99%   Filed Weights   06/01/20 0809  Weight: 207 lb 8 oz (94.1 kg)   Physical Exam Vitals reviewed.  Constitutional:      Appearance: Normal appearance. He is obese.  Cardiovascular:     Rate and Rhythm: Normal rate and regular  rhythm.     Pulses: Normal pulses.     Heart sounds: Normal heart sounds.  Pulmonary:     Effort: Pulmonary effort is normal.     Breath sounds: Normal breath sounds.  Abdominal:     Palpations: Abdomen is soft. There is no hepatomegaly, splenomegaly or mass.     Tenderness: There is no abdominal tenderness.  Musculoskeletal:     Right lower leg: No edema.     Left lower leg: No edema.  Lymphadenopathy:     Upper Body:     Right upper body: No axillary adenopathy.     Left upper body: No axillary adenopathy.  Neurological:     General: No focal deficit present.     Mental Status: He is alert and oriented to person, place, and time.  Psychiatric:        Mood and Affect: Mood normal.        Behavior: Behavior normal.      LABORATORY DATA:  I have reviewed the data as listed Recent Results (from the past 2160 hour(s))  SARS CORONAVIRUS 2 (TAT 6-24 HRS) Nasopharyngeal Nasopharyngeal Swab     Status: None   Collection Time: 05/21/20  2:42 PM   Specimen: Nasopharyngeal Swab  Result Value Ref Range   SARS Coronavirus 2 NEGATIVE NEGATIVE    Comment: (NOTE) SARS-CoV-2 target nucleic acids are NOT DETECTED.  The SARS-CoV-2 RNA is generally detectable in upper and lower respiratory specimens during the acute phase of infection. Negative results do not preclude SARS-CoV-2 infection, do not rule out co-infections with other pathogens, and should not be used as the sole basis for treatment or other patient management decisions. Negative results must be combined with clinical observations, patient history, and epidemiological information. The expected result is Negative.  Fact  Sheet for Patients: SugarRoll.be  Fact Sheet for Healthcare Providers: https://www.woods-mathews.com/  This test is not yet approved or cleared by the [place] FDA and  has been authorized for detection and/or diagnosis of SARS-CoV-2 by FDA under an Emergency  Use Authorization (EUA). This EUA will remain  in effect (meaning this test can be used) for the duration of the COVID-19 declaration under Se ction 564(b)(1) of the Act, 21 U.S.C. section 360bbb-3(b)(1), unless the authorization is terminated or revoked sooner.  Performed at [facility], [place] [address], [place], [place] [address]   CBC with Differential/Platelet     Status: Abnormal   Collection Time: 05/21/20  2:47 PM  Result Value Ref Range   WBC 11.2 (H) 4.0 - 10.5 K/uL   RBC 2.57 (L) 4.22 - 5.81 MIL/uL   Hemoglobin 7.5 (L) 13.0 - 17.0 g/dL   HCT 22.8 (L) 39 - 52 %   MCV 88.7 80.0 - 100.0 fL   MCH 29.2 26.0 - 34.0 pg   MCHC 32.9 30.0 - 36.0 g/dL   RDW 13.2 11.5 - 15.5 %   Platelets 573 (H) 150 - 400 K/uL   nRBC 0.0 0.0 - 0.2 %   Neutrophils Relative % 76 %   Neutro Abs 8.6 (H) 1.7 - 7.7 K/uL   Lymphocytes Relative 14 %   Lymphs Abs 1.5 0.7 - 4.0 K/uL   Monocytes Relative 7 %   Monocytes Absolute 0.8 0 - 1 K/uL   Eosinophils Relative 1 %   Eosinophils Absolute 0.1 0 - 0 K/uL   Basophils Relative 0 %   Basophils Absolute 0.0 0 - 0 K/uL   Immature Granulocytes 2 %   Abs Immature Granulocytes 0.17 (H) 0.00 - 0.07 K/uL    Comment: Performed at [facility], [address], [place], [place] [address]  Basic metabolic panel     Status: Abnormal   Collection Time: 05/21/20  2:47 PM  Result Value Ref Range   Sodium 120 (L) 135 - 145 mmol/L   Potassium 4.0 3.5 - 5.1 mmol/L   Chloride 92 (L) 98 - 111 mmol/L   CO2 18 (L) 22 - 32 mmol/L   Glucose, Bld 115 (H) 70 - 99 mg/dL    Comment: Glucose reference range applies only to samples taken after fasting for at least 8 hours.   BUN 30 (H) 8 - 23 mg/dL   Creatinine, Ser 2.28 (H) 0.61 - 1.24 mg/dL   Calcium 8.7 (L) 8.9 - 10.3 mg/dL   GFR calc non Af Amer 30 (L) >60 mL/min   GFR calc Af Amer 34 (L) >60 mL/min   Anion gap 10 5 - 15    Comment: Performed at [facility], [address] [place], [place] [address]  Iron and TIBC      Status: Abnormal   Collection Time: 05/21/20  2:47 PM  Result Value Ref Range   Iron 23 (L) 45 - 182 ug/dL   TIBC 328 250 - 450 ug/dL   Saturation Ratios 7 (L) 17.9 - 39.5 %   UIBC 305 ug/dL    Comment: Performed at [facility], [address], [place], [place] [address]  Ferritin     Status: None   Collection Time: 05/21/20  2:47 PM  Result Value Ref Range   Ferritin 127 24 - 336 ng/mL    Comment: Performed at [facility], [address] [place], [place] [address]  Glucose, capillary     Status: None  Collection Time: 05/25/20  1:13 PM  Result Value Ref Range   Glucose-Capillary 97 70 - 99 mg/dL    Comment: Glucose reference range applies only to samples taken after fasting for at least 8 hours.  Surgical pathology     Status: None   Collection Time: 05/25/20  2:57 PM  Result Value Ref Range   SURGICAL PATHOLOGY      SURGICAL PATHOLOGY CASE: [id number] PATIENT: Bryan Atkinson Surgical Pathology Report     Clinical History: anemia nausea, poor appetite, abd pain     FINAL MICROSCOPIC DIAGNOSIS:  A. STOMACH, ULCER, BIOPSY: - Gastric antral mucosa showing marked nonspecific reactive gastropathy with ulceration - Warthin Starry stain is negative for Helicobacter pylori    GROSS DESCRIPTION:  Received in formalin are pink-white soft tissue fragments that are submitted in toto. Number: 6 size: 0.1 to 0.2 cm blocks: 1  SW 05/26/2020    Final Diagnosis performed by Jaquita Folds, MD.   Electronically signed 05/27/2020 Technical component performed at [facility], [place] [address], [place], [place] [address].  Professional component performed at [facility], [place] [address] [place], [place] [address].  Immunohistochemistry Technical component (if applicable) was performed at [facility]. [address]  [place], [place], [place] [address].   IMMUNOHISTOCHEMISTRY DISCLAIMER (if applicable): Some  of these immunohistochemical stains may have been developed and the performance characteristics determine by [facility]. Some may not have been cleared or approved by the U.S. Food and Drug Administration. The FDA has determined that such clearance or approval is not necessary. This test is used for clinical purposes. It should not be regarded as investigational or for research. This laboratory is certified under the [place] (CLIA-88) as qualified to perform high complexity clinical laboratory testing.  The controls stained appropriately.     RADIOGRAPHIC STUDIES: I have personally reviewed the radiological images as listed and agreed with the findings in the report. No results found.  ASSESSMENT:  1.  Normocytic anemia: -CBC on 05/21/2020 shows hemoglobin 7.5 with MCV of 88.7.  White count was elevated at 11.2 and platelet count 573. -Ferritin was 127 with percent saturation of 7. -EGD on 05/25/2020 shows normal esophagus, small hiatal hernia, normal duodenum.  He had 2 gastric ulcers, the largest measuring 1.75 cm deep benign-appearing prepyloric antral ulcer with no bleeding stigmata. -Biopsy of the ulcer shows nonspecific reactive gastropathy, negative for H. pylori. -He reports that he was taking Aleve every night for arthritic pains, stopped couple of days prior to EGD. -He has been taking iron tablet daily for the past 4 to 6 weeks.  Complains of tiredness.  Denied any prior history of blood transfusion.  2.  Social/family history: -He is a non-smoker.  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 -Father had lung cancer and mother had metastatic colon cancer.   PLAN:  1.  Normocytic anemia: -We will repeat his CBC and rule out other nutritional deficiencies.  We will also check for hemolysis.  Will check for plasma cell disorders. -As he is complaining of tiredness, I have recommended parenteral iron therapy in the form of  Feraheme weekly x2.  We discussed about side effects including rare chance of anaphylactic reactions. -I will see him back in 2 weeks for follow-up.  2.  Leukocytosis: -He has intermittent leukocytosis since 2016.  If it continues to be high, will consider further testing.  3.  CKD: -Most recent creatinine has increased to  2.28.  He is being referred to nephrology soon.  4.  Hypertension: -Continue amlodipine 10 mg daily, lisinopril 20 mg daily, atenolol 50 mg twice daily.  Blood pressure today is 127/80.   All questions were answered. The patient knows to call the clinic with any problems, questions or concerns.   Derek Jack, MD 06/01/20 8:27 AM  [place] [phone]   I, Milinda Antis, am acting as a scribe for Dr. Sanda Linger.  I, Derek Jack MD, have reviewed the above documentation for accuracy and completeness, and I agree with the above.

## 2020-06-01 NOTE — Patient Instructions (Addendum)
[place] at [facility] Discharge Instructions  You were seen by Dr. Delton Coombes.  He is a blood specialist called a hematologist.  He talked with you about your recent labs.  He wants to draw additional labs to assess your anemia.  We will do the labs today.  He also wants to give you 2 iron infusions.  They will be 1 week apart.  You should start feeling better within a few weeks after the iron infusions.  We will see you back in 2 weeks to discuss your lab work.    Thank you for choosing [place] at [facility] to provide your oncology and hematology care.  To afford each patient quality time with our provider, please arrive at least 15 minutes before your scheduled appointment time.   If you have a lab appointment with the [facility] please come in thru the Main Entrance and check in at the main information desk.  You need to re-schedule your appointment should you arrive 10 or more minutes late.  We strive to give you quality time with our providers, and arriving late affects you and other patients whose appointments are after yours.  Also, if you no show three or more times for appointments you may be dismissed from the clinic at the providers discretion.     Again, thank you for choosing [facility].  Our hope is that these requests will decrease the amount of time that you wait before being seen by our physicians.       _____________________________________________________________  Should you have questions after your visit to [facility], please contact our office at [phone] and follow the prompts.  Our office hours are 8:00 a.m. and 4:30 p.m. Monday - Friday.  Please note that voicemails left after 4:00 p.m. may not be returned until the following business day.  We are closed weekends and major holidays.  You do have access to a nurse 24-7, just call the main number to the clinic [phone] and do not press  any options, hold on the line and a nurse will answer the phone.    For prescription refill requests, have your pharmacy contact our office and allow 72 hours.    Due to Covid, you will need to wear a mask upon entering the hospital. If you do not have a mask, a mask will be given to you at the Main Entrance upon arrival. For doctor visits, patients may have 1 support person age 15 or older with them. For treatment visits, patients can not have anyone with them due to social distancing guidelines and our immunocompromised population.

## 2020-06-02 LAB — PROTEIN ELECTROPHORESIS, SERUM
A/G Ratio: 0.9 (ref 0.7–1.7)
Albumin ELP: 3.1 g/dL (ref 2.9–4.4)
Alpha-1-Globulin: 0.3 g/dL (ref 0.0–0.4)
Alpha-2-Globulin: 1 g/dL (ref 0.4–1.0)
Beta Globulin: 1.1 g/dL (ref 0.7–1.3)
Gamma Globulin: 1.1 g/dL (ref 0.4–1.8)
Globulin, Total: 3.5 g/dL (ref 2.2–3.9)
Total Protein ELP: 6.6 g/dL (ref 6.0–8.5)

## 2020-06-02 LAB — KAPPA/LAMBDA LIGHT CHAINS
Kappa free light chain: 53.6 mg/L — ABNORMAL HIGH (ref 3.3–19.4)
Kappa, lambda light chain ratio: 0.85 (ref 0.26–1.65)
Lambda free light chains: 62.9 mg/L — ABNORMAL HIGH (ref 5.7–26.3)

## 2020-06-02 LAB — HAPTOGLOBIN: Haptoglobin: 348 mg/dL (ref 32–363)

## 2020-06-03 LAB — IMMUNOFIXATION ELECTROPHORESIS
IgA: 167 mg/dL (ref 61–437)
IgG (Immunoglobin G), Serum: 1057 mg/dL (ref 603–1613)
IgM (Immunoglobulin M), Srm: 172 mg/dL (ref 20–172)
Total Protein ELP: 6.8 g/dL (ref 6.0–8.5)

## 2020-06-04 LAB — COPPER, SERUM: Copper: 193 ug/dL — ABNORMAL HIGH (ref 69–132)

## 2020-06-05 ENCOUNTER — Other Ambulatory Visit: Payer: Self-pay

## 2020-06-05 ENCOUNTER — Encounter (HOSPITAL_COMMUNITY): Payer: Self-pay

## 2020-06-05 ENCOUNTER — Inpatient Hospital Stay (HOSPITAL_COMMUNITY): Payer: 59

## 2020-06-05 VITALS — BP 100/55 | HR 68 | Temp 96.9°F | Resp 18

## 2020-06-05 DIAGNOSIS — D649 Anemia, unspecified: Secondary | ICD-10-CM | POA: Diagnosis not present

## 2020-06-05 DIAGNOSIS — N1832 Chronic kidney disease, stage 3b: Secondary | ICD-10-CM

## 2020-06-05 LAB — METHYLMALONIC ACID, SERUM: Methylmalonic Acid, Quantitative: 123 nmol/L (ref 0–378)

## 2020-06-05 MED ORDER — SODIUM CHLORIDE 0.9 % IV SOLN
510.0000 mg | Freq: Once | INTRAVENOUS | Status: AC
  Administered 2020-06-05: 510 mg via INTRAVENOUS
  Filled 2020-06-05: qty 510

## 2020-06-05 MED ORDER — SODIUM CHLORIDE 0.9 % IV SOLN: Freq: Once | INTRAVENOUS | Status: AC

## 2020-06-05 NOTE — Progress Notes (Signed)
Tolerated infusion w/o adverse reaction.  Alert, in no distress.  VSS.  Discharged ambulatory.  

## 2020-06-12 ENCOUNTER — Encounter (HOSPITAL_COMMUNITY): Payer: Self-pay

## 2020-06-12 ENCOUNTER — Other Ambulatory Visit: Payer: Self-pay

## 2020-06-12 ENCOUNTER — Inpatient Hospital Stay (HOSPITAL_COMMUNITY): Payer: 59

## 2020-06-12 VITALS — BP 112/69 | HR 67 | Temp 97.5°F | Resp 18

## 2020-06-12 DIAGNOSIS — D649 Anemia, unspecified: Secondary | ICD-10-CM | POA: Diagnosis not present

## 2020-06-12 DIAGNOSIS — D631 Anemia in chronic kidney disease: Secondary | ICD-10-CM

## 2020-06-12 MED ORDER — SODIUM CHLORIDE 0.9 % IV SOLN
510.0000 mg | Freq: Once | INTRAVENOUS | Status: AC
  Administered 2020-06-12: 510 mg via INTRAVENOUS
  Filled 2020-06-12: qty 510

## 2020-06-12 MED ORDER — SODIUM CHLORIDE 0.9 % IV SOLN: Freq: Once | INTRAVENOUS | Status: AC

## 2020-06-12 NOTE — Progress Notes (Signed)
Patient presents today for Feraheme infusion. Vital signs stable. Patient denies any significant changes since his last visit. Patient has back pain he rates a 4/10 today, ongoing. Patient takes over the counter medication for his back pain. Fatigue has improved per patient's words.   Feraheme given today per MD orders. Tolerated infusion without adverse affects. Vital signs stable. No complaints at this time. Discharged from clinic ambulatory. F/U with [facility] as scheduled.

## 2020-06-12 NOTE — Patient Instructions (Signed)
[facility]  Discharge Instructions:   _______________________________________________________________  Thank you for choosing [facility] at [facility] to provide your oncology and hematology care.  To afford each patient quality time with our providers, please arrive at least 15 minutes before your scheduled appointment.  You need to re-schedule your appointment if you arrive 10 or more minutes late.  We strive to give you quality time with our providers, and arriving late affects you and other patients whose appointments are after yours.  Also, if you no show three or more times for appointments you may be dismissed from the clinic.  Again, thank you for choosing [facility]. Our hope is that these requests will allow you access to exceptional care and in a timely manner. _______________________________________________________________  If you have questions after your visit, please contact our office at [phone] between the hours of 8:30 a.m. and 5:00 p.m. Voicemails left after 4:30 p.m. will not be returned until the following business day. _______________________________________________________________  For prescription refill requests, have your pharmacy contact our office. _______________________________________________________________  Recommendations made by the consultant and any test results will be sent to your referring physician. _______________________________________________________________ 

## 2020-06-15 ENCOUNTER — Inpatient Hospital Stay (HOSPITAL_BASED_OUTPATIENT_CLINIC_OR_DEPARTMENT_OTHER): Payer: 59 | Admitting: Hematology

## 2020-06-15 ENCOUNTER — Other Ambulatory Visit: Payer: Self-pay

## 2020-06-15 VITALS — BP 142/80 | HR 81 | Temp 98.9°F | Resp 18 | Wt 208.7 lb

## 2020-06-15 DIAGNOSIS — D649 Anemia, unspecified: Secondary | ICD-10-CM

## 2020-06-15 NOTE — Patient Instructions (Signed)
[place] at [facility] Discharge Instructions  You were seen today by Dr. Delton Coombes. He went over your recent results. You can stop taking the amlodipine, but continue taking the atenolol and lisinopril to check if your blood pressure is stable. Dr. Delton Coombes will see you back in 5 weeks for labs and follow up.   Thank you for choosing [place] at [facility] to provide your oncology and hematology care.  To afford each patient quality time with our provider, please arrive at least 15 minutes before your scheduled appointment time.   If you have a lab appointment with the [facility] [place] please come in thru the Main Entrance and check in at the main information desk  You need to re-schedule your appointment should you arrive 10 or more minutes late.  We strive to give you quality time with our providers, and arriving late affects you and other patients whose appointments are after yours.  Also, if you no show three or more times for appointments you may be dismissed from the clinic at the providers discretion.     Again, thank you for choosing [facility].  Our hope is that these requests will decrease the amount of time that you wait before being seen by our physicians.       _____________________________________________________________  Should you have questions after your visit to [facility], please contact our office at [phone] between the hours of 8:00 a.m. and 4:30 p.m.  Voicemails left after 4:00 p.m. will not be returned until the following business day.  For prescription refill requests, have your pharmacy contact our office and allow 72 hours.    Cancer Center Support Programs:   > Cancer Support Group  2nd Tuesday of the month 1pm-2pm, Journey Room

## 2020-06-15 NOTE — Progress Notes (Signed)
[place], [place] [address]   CLINIC:  Medical Oncology/Hematology  PCP:  Bryan Beals, NP [place] / [place] [id number]  [phone]  REASON FOR VISIT:  Follow-up for anemia  PRIOR THERAPY: None  CURRENT THERAPY: Intermittent Feraheme  INTERVAL HISTORY:  [name], a 64 y.o. male, returns for routine follow-up for his anemia. Bryan Atkinson was last seen on 06/01/2020.  Today he is accompanied by his wife. He continues complaining of feeling fatigued. He felt slightly better after his last Feraheme on 8/27. His appetite has also improved. He is not taking any steroids currently.  He has never smoked, but his entire family smoked and his wife smoked until quitting in 2015.   REVIEW OF SYSTEMS:  Review of Systems  Constitutional: Positive for appetite change (moderately decreased) and fatigue (severe).  Respiratory: Positive for cough.   Cardiovascular: Positive for leg swelling (in ankles).  Genitourinary: Positive for frequency.   Musculoskeletal: Positive for arthralgias (7/10 pain in hip and lower back).  Neurological: Positive for dizziness.  All other systems reviewed and are negative.   PAST MEDICAL/SURGICAL HISTORY:  Past Medical History:  Diagnosis Date  . Arthritis   . Diabetes mellitus without complication (Bryan Atkinson)   . Gout   . History of kidney stones   . Hyperlipidemia   . Hypertension    Past Surgical History:  Procedure Laterality Date  . BIOPSY  05/25/2020   Procedure: BIOPSY;  Surgeon: Bryan Dolin, MD;  [facility]: [facility];  Service: Endoscopy;;  . CATARACT EXTRACTION W/PHACO Right 05/13/2019   Procedure: CATARACT EXTRACTION PHACO AND INTRAOCULAR LENS PLACEMENT ([place]);  Surgeon: Bryan Goldmann, MD;  Location: [facility];  Service: Ophthalmology;  Laterality: Right;  CDE: 10.31  . CATARACT EXTRACTION W/PHACO Left 05/27/2019   Procedure: CATARACT EXTRACTION PHACO AND INTRAOCULAR LENS  PLACEMENT ([facility]);  Surgeon: Bryan Goldmann, MD;  Location: [facility];  Service: Ophthalmology;  Laterality: Left;  CDE: 6.49  . COLONOSCOPY     times two 2002 ,2007  . COLONOSCOPY N/A 10/07/2013   Procedure: COLONOSCOPY;  Surgeon: Bryan Dolin, MD;  [facility];  Service: Endoscopy;  Laterality: N/A;  8:30 AM  . COLONOSCOPY N/A 10/05/2018   Dr. Gala Atkinson: Diverticulosis.  Next colonoscopy 5 years due to first-degree relative with colon cancer  . ESOPHAGOGASTRODUODENOSCOPY (EGD) WITH PROPOFOL N/A 05/25/2020   Procedure: ESOPHAGOGASTRODUODENOSCOPY (EGD) WITH PROPOFOL;  Surgeon: Bryan Dolin, MD;  [facility];  Service: Endoscopy;  Laterality: N/A;  12:45pm  . JOINT REPLACEMENT    . KNEE ARTHROPLASTY Left 06/27/2019   Procedure: COMPUTER ASSISTED TOTAL KNEE ARTHROPLASTY;  Surgeon: Bryan Can, MD;  Location: [facility];  Service: Orthopedics;  Laterality: Left;  . KNEE ARTHROPLASTY Right 08/15/2019   Procedure: COMPUTER ASSISTED TOTAL KNEE ARTHROPLASTY;  Surgeon: Bryan Can, MD;  Location: [facility];  Service: Orthopedics;  Laterality: Right;  . LEG SURGERY Right early 44's    SOCIAL HISTORY:  Social History   Socioeconomic History  . Marital status: Married    Spouse name: Not on file  . Number of children: 2  . Years of education: Not on file  . Highest education level: Not on file  Occupational History  . 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 Smoking status: Never Smoker  . Smokeless tobacco: Never Used  Vaping Use  . Vaping  Use: Never used  Substance and Sexual Activity  . Alcohol use: Yes    Comment: occasionally  . Drug use: No  . Sexual activity: Yes  Other Topics Concern  . Not on file  Social History Narrative   Daughter is 13, Son is 45.  3 grandchildren. Married for 34 years   Social Determinants of Radio broadcast assistant Strain:   . Difficulty of Paying Living Expenses:  Not on file  Food Insecurity:   . Worried About Charity fundraiser in the Last Year: Not on file  . Ran Out of Food in the Last Year: Not on file  Transportation Needs:   . Lack of Transportation (Medical): Not on file  . Lack of Transportation (Non-Medical): Not on file  Physical Activity:   . Days of Exercise per Week: Not on file  . Minutes of Exercise per Session: Not on file  Stress:   . Feeling of Stress : Not on file  Social Connections:   . Frequency of Communication with Friends and Family: Not on file  . Frequency of Social Gatherings with Friends and Family: Not on file  . Attends Religious Services: Not on file  . Active Member of Clubs or Organizations: Not on file  . Attends Archivist Meetings: Not on file  . Marital Status: Not on file  Intimate Partner Violence:   . Fear of Current or Ex-Partner: Not on file  . Emotionally Abused: Not on file  . Physically Abused: Not on file  . Sexually Abused: Not on file    FAMILY HISTORY:  Family History  Problem Relation Age of Onset  . Colon cancer Mother        She died from her malignancy, 34  . Lung cancer Father        Died from lung cancer, age 68  . Emphysema Brother        Died at 56 from lung disease  . COPD Brother   . Dementia Sister   . Healthy Son   . Healthy Daughter     CURRENT MEDICATIONS:  Current Outpatient Medications  Medication Sig Dispense Refill  . allopurinol (ZYLOPRIM) 300 MG tablet Take 300 mg by mouth daily.    Marland Kitchen amLODipine (NORVASC) 10 MG tablet Take 10 mg by mouth daily.     Marland Kitchen Apple Cider Vinegar 500 MG TABS Take 500 mg by mouth daily.     Marland Kitchen aspirin EC 81 MG tablet Take 81 mg by mouth daily. Swallow whole.    Marland Kitchen atenolol (TENORMIN) 50 MG tablet Take 50 mg by mouth 2 (two) times daily.     Marland Kitchen Co-Enzyme Q10 100 MG CAPS Take 100 mg by mouth daily.     . Cranberry 500 MG TABS Take 500 mg by mouth daily.    . fenofibrate (TRICOR) 145 MG tablet Take 145 mg by mouth daily.    .  ferrous sulfate 325 (65 FE) MG tablet Take 325 mg by mouth daily with breakfast.    . Flaxseed, Linseed, (FLAXSEED OIL) 1000 MG CAPS Take 1,000 mg by mouth daily.    Marland Kitchen glucose blood test Atkinson 1 each by Other route as needed for other. Use as instructed    . Krill Oil 500 MG CAPS Take 500 mg by mouth 2 (two) times a day.    . lisinopril (ZESTRIL) 20 MG tablet Take 20 mg by mouth daily.    . metFORMIN (GLUCOPHAGE) 1000 MG tablet Take 1,000  mg by mouth 2 (two) times daily.  5  . Multiple Vitamin (MULTIVITAMIN) tablet Take 1 tablet by mouth daily.    . niacin (NIASPAN) 1000 MG CR tablet Take 1,000 mg by mouth at bedtime.    . pantoprazole (PROTONIX) 40 MG tablet Take 40 mg by mouth 2 (two) times daily.    . pravastatin (PRAVACHOL) 40 MG tablet Take 40 mg by mouth at bedtime.     . Turmeric 500 MG CAPS Take 500 mg by mouth daily.     No current facility-administered medications for this visit.    ALLERGIES:  No Known Allergies  PHYSICAL EXAM:  Performance status (ECOG): 1 - Symptomatic but completely ambulatory  Vitals:   06/15/20 1613  BP: (!) 142/80  Pulse: 81  Resp: 18  Temp: 98.9 F (37.2 C)  SpO2: 95%   Wt Readings from Last 3 Encounters:  06/15/20 208 lb 11.2 oz (94.7 kg)  06/01/20 207 lb 8 oz (94.1 kg)  05/25/20 204 lb 12.9 oz (92.9 kg)   Physical Exam Vitals reviewed.  Constitutional:      Appearance: Normal appearance. He is obese.  Cardiovascular:     Rate and Rhythm: Normal rate and regular rhythm.     Pulses: Normal pulses.     Heart sounds: Normal heart sounds.  Pulmonary:     Effort: Pulmonary effort is normal.     Breath sounds: Normal breath sounds.  Musculoskeletal:     Right lower leg: Edema (1+) present.     Left lower leg: Edema (1+) present.  Neurological:     General: No focal deficit present.     Mental Status: He is alert and oriented to person, place, and time.  Psychiatric:        Mood and Affect: Mood normal.        Behavior: Behavior  normal.     LABORATORY DATA:  I have reviewed the labs as listed.  CBC Latest Ref Rng & Units 06/01/2020 05/21/2020 08/16/2019  WBC 4.0 - 10.5 K/uL 13.4(H) 11.2(H) 16.6(H)  Hemoglobin 13.0 - 17.0 g/dL 9.0(L) 7.5(L) 9.9(L)  Hematocrit 39 - 52 % 27.4(L) 22.8(L) 30.8(L)  Platelets 150 - 400 K/uL 546(H) 573(H) 354   CMP Latest Ref Rng & Units 05/21/2020 08/16/2019 08/12/2019  Glucose 70 - 99 mg/dL 115(H) 140(H) 154(H)  BUN 8 - 23 mg/dL 30(H) 32(H) 32(H)  Creatinine 0.61 - 1.24 mg/dL 2.28(H) 1.65(H) 1.61(H)  Sodium 135 - 145 mmol/L 120(L) 134(L) 136  Potassium 3.5 - 5.1 mmol/L 4.0 4.0 4.0  Chloride 98 - 111 mmol/L 92(L) 105 104  CO2 22 - 32 mmol/L 18(L) 21(L) 21(L)  Calcium 8.9 - 10.3 mg/dL 8.7(L) 8.9 10.8(H)  Total Protein 6.5 - 8.1 g/dL - - 7.7  Total Bilirubin 0.3 - 1.2 mg/dL - - 0.5  Alkaline Phos 38 - 126 U/L - - 40  AST 15 - 41 U/L - - 19  ALT 0 - 44 U/L - - 16      Component Value Date/Time   RBC 3.11 (L) 06/01/2020 0848   RBC 3.11 (L) 06/01/2020 0848   MCV 88.1 06/01/2020 0848   MCH 28.9 06/01/2020 0848   MCHC 32.8 06/01/2020 0848   RDW 13.1 06/01/2020 0848   LYMPHSABS 1.1 06/01/2020 0848   MONOABS 0.9 06/01/2020 0848   EOSABS 0.0 06/01/2020 0848   BASOSABS 0.0 06/01/2020 0848   Lab Results  Component Value Date   LDH 99 06/01/2020   LDH 126 11/13/2014  Lab Results  Component Value Date   TOTALPROTELP 6.8 06/01/2020   TOTALPROTELP 6.6 06/01/2020   ALBUMINELP 3.1 06/01/2020   A1GS 0.3 06/01/2020   A2GS 1.0 06/01/2020   BETS 1.1 06/01/2020   BETA2SER 5.7 11/13/2014   GAMS 1.1 06/01/2020   MSPIKE Not Observed 06/01/2020   SPEI Comment 06/01/2020    Lab Results  Component Value Date   KPAFRELGTCHN 53.6 (H) 06/01/2020   LAMBDASER 62.9 (H) 06/01/2020   KAPLAMBRATIO 0.85 06/01/2020    DIAGNOSTIC IMAGING:  I have independently reviewed the scans and discussed with the patient. No results found.   ASSESSMENT:  1.  Normocytic anemia: -CBC on 05/21/2020 shows  hemoglobin 7.5 with MCV of 88.7.  White count was elevated at 11.2 and platelet count 573. -Ferritin was 127 with percent saturation of 7. -EGD on 05/25/2020 shows normal esophagus, small hiatal hernia, normal duodenum.  He had 2 gastric ulcers, the largest measuring 1.75 cm deep benign-appearing prepyloric antral ulcer with no bleeding stigmata. -Biopsy of the ulcer shows nonspecific reactive gastropathy, negative for H. pylori. -He reports that he was taking Aleve every night for arthritic pains, stopped couple of days prior to EGD. -Labs on 06/01/2020 showed SPEP was negative.  B12, methylmalonic acid and copper levels were normal.  LDH was normal.  Free kappa light chains are 53.6, lambda light chain 62.9 and ratio of 0.85.  Immunofixation was negative. -Feraheme on 06/05/2020 and 06/12/2020.  2.  Social/family history: -He is a non-smoker.  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 -Father had lung cancer and mother had metastatic colon cancer.   PLAN:  1.  Normocytic anemia: -He received last Feraheme on 06/12/2020.  His energy levels have improved slightly. -I plan to repeat his CBC, ferritin and iron panel in 4 weeks.  2.  Leukocytosis: -He has intermittent leukocytosis since 2016. -He is a non-smoker.  CBC from 06/01/2020 shows white count 13.4 and platelet count 546.  ANC was elevated. -I have recommended JAK2 V617F and BCR/ABL testing.  3.  CKD: -He is seeing nephrology soon.  4.  Hypertension: -He has developed leg swellings. -He is on lisinopril/HCTZ 20/25, atenolol 50 mg and amlodipine 10 mg. -He reports having blood pressure of 110/70 at home.  I have told him to stop amlodipine if blood pressure is controlled as he has ankle swellings.  Orders placed this encounter:  No orders of the defined types were placed in this encounter.    Derek Jack, MD [place] [phone]   I, Milinda Antis, am acting as a scribe for Dr. Sanda Linger.  I, Derek Jack MD, have reviewed the above documentation for accuracy and completeness, and I agree with the above.

## 2020-06-16 ENCOUNTER — Other Ambulatory Visit: Payer: Self-pay | Admitting: Internal Medicine

## 2020-06-17 DIAGNOSIS — E785 Hyperlipidemia, unspecified: Secondary | ICD-10-CM | POA: Insufficient documentation

## 2020-06-23 ENCOUNTER — Other Ambulatory Visit (HOSPITAL_COMMUNITY): Payer: Self-pay | Admitting: Nephrology

## 2020-06-23 ENCOUNTER — Other Ambulatory Visit: Payer: Self-pay | Admitting: Nephrology

## 2020-06-23 DIAGNOSIS — E1122 Type 2 diabetes mellitus with diabetic chronic kidney disease: Secondary | ICD-10-CM

## 2020-06-23 DIAGNOSIS — E871 Hypo-osmolality and hyponatremia: Secondary | ICD-10-CM

## 2020-06-23 DIAGNOSIS — E1140 Type 2 diabetes mellitus with diabetic neuropathy, unspecified: Secondary | ICD-10-CM

## 2020-06-23 DIAGNOSIS — N170 Acute kidney failure with tubular necrosis: Secondary | ICD-10-CM

## 2020-06-23 DIAGNOSIS — D631 Anemia in chronic kidney disease: Secondary | ICD-10-CM

## 2020-06-23 DIAGNOSIS — Z79899 Other long term (current) drug therapy: Secondary | ICD-10-CM

## 2020-06-29 ENCOUNTER — Ambulatory Visit (HOSPITAL_COMMUNITY)
Admission: RE | Admit: 2020-06-29 | Discharge: 2020-06-29 | Disposition: A | Discharge status: Home / Self Care | Payer: 59 | Source: Ambulatory Visit | Attending: Nephrology | Admitting: Nephrology

## 2020-06-29 ENCOUNTER — Other Ambulatory Visit: Payer: Self-pay

## 2020-06-29 DIAGNOSIS — E871 Hypo-osmolality and hyponatremia: Secondary | ICD-10-CM | POA: Insufficient documentation

## 2020-06-29 DIAGNOSIS — E1122 Type 2 diabetes mellitus with diabetic chronic kidney disease: Secondary | ICD-10-CM | POA: Diagnosis not present

## 2020-06-29 DIAGNOSIS — N170 Acute kidney failure with tubular necrosis: Secondary | ICD-10-CM | POA: Diagnosis present

## 2020-06-29 DIAGNOSIS — D631 Anemia in chronic kidney disease: Secondary | ICD-10-CM | POA: Insufficient documentation

## 2020-06-29 DIAGNOSIS — E1140 Type 2 diabetes mellitus with diabetic neuropathy, unspecified: Secondary | ICD-10-CM | POA: Diagnosis present

## 2020-06-29 DIAGNOSIS — N189 Chronic kidney disease, unspecified: Secondary | ICD-10-CM | POA: Insufficient documentation

## 2020-06-29 DIAGNOSIS — Z79899 Other long term (current) drug therapy: Secondary | ICD-10-CM | POA: Insufficient documentation

## 2020-06-30 ENCOUNTER — Other Ambulatory Visit (HOSPITAL_COMMUNITY): Payer: Self-pay | Admitting: Nephrology

## 2020-06-30 DIAGNOSIS — E1122 Type 2 diabetes mellitus with diabetic chronic kidney disease: Secondary | ICD-10-CM

## 2020-07-01 DIAGNOSIS — M419 Scoliosis, unspecified: Secondary | ICD-10-CM | POA: Insufficient documentation

## 2020-07-01 DIAGNOSIS — M51369 Other intervertebral disc degeneration, lumbar region without mention of lumbar back pain or lower extremity pain: Secondary | ICD-10-CM | POA: Insufficient documentation

## 2020-07-14 ENCOUNTER — Other Ambulatory Visit (HOSPITAL_COMMUNITY): Payer: 59

## 2020-07-14 ENCOUNTER — Other Ambulatory Visit: Payer: Self-pay

## 2020-07-14 ENCOUNTER — Inpatient Hospital Stay (HOSPITAL_COMMUNITY): Payer: 59 | Attending: Hematology

## 2020-07-14 ENCOUNTER — Ambulatory Visit (HOSPITAL_BASED_OUTPATIENT_CLINIC_OR_DEPARTMENT_OTHER)
Admission: RE | Admit: 2020-07-14 | Discharge: 2020-07-14 | Disposition: A | Discharge status: Home / Self Care | Payer: 59 | Source: Ambulatory Visit | Attending: Nephrology | Admitting: Nephrology

## 2020-07-14 DIAGNOSIS — E1122 Type 2 diabetes mellitus with diabetic chronic kidney disease: Secondary | ICD-10-CM | POA: Insufficient documentation

## 2020-07-14 DIAGNOSIS — D649 Anemia, unspecified: Secondary | ICD-10-CM | POA: Insufficient documentation

## 2020-07-14 LAB — CBC WITH DIFFERENTIAL/PLATELET
Abs Immature Granulocytes: 0.07 10*3/uL (ref 0.00–0.07)
Basophils Absolute: 0 10*3/uL (ref 0.0–0.1)
Basophils Relative: 0 %
Eosinophils Absolute: 0 10*3/uL (ref 0.0–0.5)
Eosinophils Relative: 0 %
HCT: 31.4 % — ABNORMAL LOW (ref 39.0–52.0)
Hemoglobin: 9.7 g/dL — ABNORMAL LOW (ref 13.0–17.0)
Immature Granulocytes: 1 %
Lymphocytes Relative: 15 %
Lymphs Abs: 1.5 10*3/uL (ref 0.7–4.0)
MCH: 28.4 pg (ref 26.0–34.0)
MCHC: 30.9 g/dL (ref 30.0–36.0)
MCV: 91.8 fL (ref 80.0–100.0)
Monocytes Absolute: 0.9 10*3/uL (ref 0.1–1.0)
Monocytes Relative: 9 %
Neutro Abs: 7.9 10*3/uL — ABNORMAL HIGH (ref 1.7–7.7)
Neutrophils Relative %: 75 %
Platelets: 433 10*3/uL — ABNORMAL HIGH (ref 150–400)
RBC: 3.42 MIL/uL — ABNORMAL LOW (ref 4.22–5.81)
RDW: 15.9 % — ABNORMAL HIGH (ref 11.5–15.5)
WBC: 10.5 10*3/uL (ref 4.0–10.5)
nRBC: 0 % (ref 0.0–0.2)

## 2020-07-14 LAB — ECHOCARDIOGRAM COMPLETE
AR max vel: 2.3 cm2
AV Area VTI: 2.24 cm2
AV Area mean vel: 2.15 cm2
AV Mean grad: 7 mmHg
AV Peak grad: 14 mmHg
Ao pk vel: 1.87 m/s
Area-P 1/2: 3.17 cm2
Calc EF: 58.3 %
S' Lateral: 2.29 cm
Single Plane A2C EF: 57.2 %
Single Plane A4C EF: 57.8 %

## 2020-07-14 LAB — IRON AND TIBC
Iron: 21 ug/dL — ABNORMAL LOW (ref 45–182)
Saturation Ratios: 7 % — ABNORMAL LOW (ref 17.9–39.5)
TIBC: 308 ug/dL (ref 250–450)
UIBC: 287 ug/dL

## 2020-07-14 LAB — FERRITIN: Ferritin: 289 ng/mL (ref 24–336)

## 2020-07-14 NOTE — Progress Notes (Signed)
*  PRELIMINARY RESULTS* Echocardiogram 2D Echocardiogram has been performed.  Samuel Germany 07/14/2020, 2:41 PM

## 2020-07-18 LAB — BCR-ABL1 FISH
Cells Analyzed: 200
Cells Counted: 200

## 2020-07-21 ENCOUNTER — Other Ambulatory Visit: Payer: Self-pay

## 2020-07-21 ENCOUNTER — Encounter (HOSPITAL_COMMUNITY): Payer: Self-pay | Admitting: Hematology

## 2020-07-21 ENCOUNTER — Inpatient Hospital Stay (HOSPITAL_COMMUNITY): Payer: 59 | Attending: Hematology | Admitting: Hematology

## 2020-07-21 VITALS — BP 135/84 | HR 76 | Temp 97.1°F | Resp 18 | Wt 200.2 lb

## 2020-07-21 DIAGNOSIS — D509 Iron deficiency anemia, unspecified: Secondary | ICD-10-CM | POA: Diagnosis not present

## 2020-07-21 DIAGNOSIS — K259 Gastric ulcer, unspecified as acute or chronic, without hemorrhage or perforation: Secondary | ICD-10-CM | POA: Insufficient documentation

## 2020-07-21 DIAGNOSIS — D631 Anemia in chronic kidney disease: Secondary | ICD-10-CM | POA: Diagnosis not present

## 2020-07-21 DIAGNOSIS — N189 Chronic kidney disease, unspecified: Secondary | ICD-10-CM | POA: Diagnosis not present

## 2020-07-21 DIAGNOSIS — D72829 Elevated white blood cell count, unspecified: Secondary | ICD-10-CM | POA: Insufficient documentation

## 2020-07-21 DIAGNOSIS — I129 Hypertensive chronic kidney disease with stage 1 through stage 4 chronic kidney disease, or unspecified chronic kidney disease: Secondary | ICD-10-CM | POA: Insufficient documentation

## 2020-07-21 LAB — CALR + JAK2 E12-15 + MPL (REFLEXED)

## 2020-07-21 LAB — JAK2 V617F, W REFLEX TO CALR/E12/MPL

## 2020-07-21 NOTE — Patient Instructions (Signed)
[place] at [facility] Discharge Instructions  You were seen today by Dr. Delton Coombes. He went over your recent results; your blood work was negative for calreticulin, JAK2, and BCR-ABL. Dr. Delton Coombes will see you back in 3 months for labs and follow up.   Thank you for choosing [facility] to provide your oncology and hematology care.  To afford each patient quality time with our provider, please arrive at least 15 minutes before your scheduled appointment time.   If you have a lab appointment with the [facility] please come in thru the Main Entrance and check in at the main information desk  You need to re-schedule your appointment should you arrive 10 or more minutes late.  We strive to give you quality time with our providers, and arriving late affects you and other patients whose appointments are after yours.  Also, if you no show three or more times for appointments you may be dismissed from the clinic at the providers discretion.     Again, thank you for choosing [facility].  Our hope is that these requests will decrease the amount of time that you wait before being seen by our physicians.       _____________________________________________________________  Should you have questions after your visit to [facility], please contact our office at [phone] between the hours of 8:00 a.m. and 4:30 p.m.  Voicemails left after 4:00 p.m. will not be returned until the following business day.  For prescription refill requests, have your pharmacy contact our office and allow 72 hours.    Cancer Center Support Programs:   > Cancer Support Group  2nd Tuesday of the month 1pm-2pm, Journey Room

## 2020-07-21 NOTE — Progress Notes (Signed)
[place], [place] [id number]   CLINIC:  Medical Oncology/Hematology  PCP:  Everardo Beals, NP [place] / [place] [address]  [phone]  REASON FOR VISIT:  Follow-up for anemia  PRIOR THERAPY: None  CURRENT THERAPY: Intermittent Feraheme  INTERVAL HISTORY:  Mr. Erie Radu, a 64 y.o. male, returns for routine follow-up for his anemia. Keontre was last seen on 06/15/2020.  Today he is accompanied by his wife. He tolerated the Feraheme well and his energy improved and is still holding steady. He is taking pain meds only as needed. His leg swelling has improved. He denies having CP or SOB with exertion.   REVIEW OF SYSTEMS:  Review of Systems  Constitutional: Positive for fatigue (75%). Negative for appetite change.  Respiratory: Positive for cough (dry). Negative for shortness of breath.   Cardiovascular: Positive for leg swelling (& ankle swelling). Negative for chest pain.  All other systems reviewed and are negative.   PAST MEDICAL/SURGICAL HISTORY:  Past Medical History:  Diagnosis Date   Arthritis    Diabetes mellitus without complication ([place])    Gout    History of kidney stones    Hyperlipidemia    Hypertension    Past Surgical History:  Procedure Laterality Date   BIOPSY  05/25/2020   Procedure: BIOPSY;  Surgeon: Daneil Dolin, MD;  [facility];  Service: Endoscopy;;   CATARACT EXTRACTION W/PHACO Right 05/13/2019   Procedure: CATARACT EXTRACTION PHACO AND INTRAOCULAR LENS PLACEMENT ([place]);  Surgeon: Baruch Goldmann, MD;  Location: [facility];  Service: Ophthalmology;  Laterality: Right;  CDE: 10.31   CATARACT EXTRACTION W/PHACO Left 05/27/2019   Procedure: CATARACT EXTRACTION PHACO AND INTRAOCULAR LENS PLACEMENT ([facility]);  Surgeon: Baruch Goldmann, MD;  Location: [facility];  Service: Ophthalmology;  Laterality: Left;  CDE: 6.49   COLONOSCOPY     times two 2002 ,2007   COLONOSCOPY N/A  10/07/2013   Procedure: COLONOSCOPY;  Surgeon: Daneil Dolin, MD;  [facility];  Service: Endoscopy;  Laterality: N/A;  8:30 AM   COLONOSCOPY N/A 10/05/2018   Dr. Gala Romney: Diverticulosis.  Next colonoscopy 5 years due to first-degree relative with colon cancer   ESOPHAGOGASTRODUODENOSCOPY (EGD) WITH PROPOFOL N/A 05/25/2020   Procedure: ESOPHAGOGASTRODUODENOSCOPY (EGD) WITH PROPOFOL;  Surgeon: Daneil Dolin, MD;  [facility];  Service: Endoscopy;  Laterality: N/A;  12:45pm   JOINT REPLACEMENT     KNEE ARTHROPLASTY Left 06/27/2019   Procedure: COMPUTER ASSISTED TOTAL KNEE ARTHROPLASTY;  Surgeon: Rod Can, MD;  Location: [facility];  Service: Orthopedics;  Laterality: Left;   KNEE ARTHROPLASTY Right 08/15/2019   Procedure: COMPUTER ASSISTED TOTAL KNEE ARTHROPLASTY;  Surgeon: Rod Can, MD;  Location: [facility];  Service: Orthopedics;  Laterality: Right;   LEG SURGERY Right early 20's    SOCIAL HISTORY:  Social History   Socioeconomic History   Marital status: Married    Spouse name: Not on file   Number of children: 2   Years of education: Not on file   Highest education level: Not on file  Occupational History   Occupation: Truck Geophysicist/field seismologist    Comment: runs a Higher education careers adviser as well, [employer] in [place]  Tobacco Use   Smoking status: Never Smoker   Smokeless tobacco: Never Used  Scientific laboratory technician Use: Never used  Substance and Sexual Activity   Alcohol use: Yes    Comment: occasionally   Drug use: No  Sexual activity: Yes  Other Topics Concern   Not on file  Social History Narrative   Daughter is 25, Son is 24.  3 grandchildren. Married for 34 years   Social Determinants of Radio broadcast assistant Strain:    Difficulty of Paying Living Expenses: Not on file  Food Insecurity:    Worried About Charity fundraiser in the Last Year: Not on file   [employer] of Food in the Last Year: Not on file  Transportation Needs:    Lack  of Transportation (Medical): Not on file   Lack of Transportation (Non-Medical): Not on file  Physical Activity:    Days of Exercise per Week: Not on file   Minutes of Exercise per Session: Not on file  Stress:    Feeling of Stress : Not on file  Social Connections:    Frequency of Communication with Friends and Family: Not on file   Frequency of Social Gatherings with Friends and Family: Not on file   Attends Religious Services: Not on file   Active Member of Clubs or Organizations: Not on file   Attends Archivist Meetings: Not on file   Marital Status: Not on file  Intimate Partner Violence:    Fear of Current or Ex-Partner: Not on file   Emotionally Abused: Not on file   Physically Abused: Not on file   Sexually Abused: Not on file    FAMILY HISTORY:  Family History  Problem Relation Age of Onset   Colon cancer Mother        She died from her malignancy, 20   Lung cancer Father        Died from lung cancer, age 62   Emphysema Brother        Died at 94 from lung disease   COPD Brother    Dementia Sister    Healthy Son    Healthy Daughter     CURRENT MEDICATIONS:  Current Outpatient Medications  Medication Sig Dispense Refill   Docusate Sodium (DSS) 100 MG CAPS Stool Softener 100 mg capsule  TAKE 1 CAPSULE BY MOUTH TWICE A DAY     allopurinol (ZYLOPRIM) 300 MG tablet Take 300 mg by mouth daily.     amLODipine (NORVASC) 10 MG tablet Take 10 mg by mouth daily.      Apple Cider Vinegar 500 MG TABS Take 500 mg by mouth daily.      aspirin EC 81 MG tablet Take 81 mg by mouth daily. Swallow whole.     atenolol (TENORMIN) 50 MG tablet Take 50 mg by mouth 2 (two) times daily.      Co-Enzyme Q10 100 MG CAPS Take 100 mg by mouth daily.      Cranberry 500 MG TABS Take 500 mg by mouth daily.     fenofibrate (TRICOR) 145 MG tablet Take 145 mg by mouth daily.     ferrous sulfate 325 (65 FE) MG tablet Take 325 mg by mouth daily with  breakfast.     Flaxseed, Linseed, (FLAXSEED OIL) 1000 MG CAPS Take 1,000 mg by mouth daily.     glipiZIDE (GLUCOTROL XL) 10 MG 24 hr tablet Take 10 mg by mouth daily.     glucose blood test strip 1 each by Other route as needed for other. Use as instructed     Krill Oil 500 MG CAPS Take 500 mg by mouth 2 (two) times a day.     lisinopril (ZESTRIL) 20 MG tablet  Take 20 mg by mouth daily.     methocarbamol (ROBAXIN) 500 MG tablet methocarbamol 500 mg tablet  TAKE 1 TABLET BY MOUTH EVERY DAY AS NEEDED     Multiple Vitamin (MULTIVITAMIN) tablet Take 1 tablet by mouth daily.     niacin (NIASPAN) 1000 MG CR tablet Take 1,000 mg by mouth at bedtime.     pantoprazole (PROTONIX) 40 MG tablet TAKE 1 TABLET BY MOUTH EVERY DAY TWICE A DAY 60 tablet 5   pravastatin (PRAVACHOL) 40 MG tablet Take 40 mg by mouth at bedtime.      traMADol (ULTRAM) 50 MG tablet tramadol 50 mg tablet  TAKE 1 TABLET EVERY 4 HOURS BY ORAL ROUTE AS NEEDED FOR 5 DAYS.     traZODone (DESYREL) 50 MG tablet trazodone 50 mg tablet  TAKE 1 TABLET BY MOUTH EVERYDAY AT BEDTIME     triamcinolone cream (KENALOG) 0.1 % triamcinolone acetonide 0.1 % topical cream  APPLY THIN COAT TO AFFECTED AREAS TWICE A DAY     Turmeric 500 MG CAPS Take 500 mg by mouth daily.     No current facility-administered medications for this visit.    ALLERGIES:  No Known Allergies  PHYSICAL EXAM:  Performance status (ECOG): 1 - Symptomatic but completely ambulatory  Vitals:   07/21/20 1459  BP: 135/84  Pulse: 76  Resp: 18  Temp: (!) 97.1 F (36.2 C)  SpO2: 99%   Wt Readings from Last 3 Encounters:  07/21/20 200 lb 3.2 oz (90.8 kg)  06/15/20 208 lb 11.2 oz (94.7 kg)  06/01/20 207 lb 8 oz (94.1 kg)   Physical Exam Vitals reviewed.  Constitutional:      Appearance: Normal appearance.  Cardiovascular:     Rate and Rhythm: Normal rate and regular rhythm.     Pulses: Normal pulses.     Heart sounds: Normal heart sounds.    Pulmonary:     Effort: Pulmonary effort is normal.     Breath sounds: Normal breath sounds.  Musculoskeletal:     Right lower leg: Edema (1+) present.  Neurological:     General: No focal deficit present.     Mental Status: He is alert and oriented to person, place, and time.  Psychiatric:        Mood and Affect: Mood normal.        Behavior: Behavior normal.     LABORATORY DATA:  I have reviewed the labs as listed.  CBC Latest Ref Rng & Units 07/14/2020 06/01/2020 05/21/2020  WBC 4.0 - 10.5 K/uL 10.5 13.4(H) 11.2(H)  Hemoglobin 13.0 - 17.0 g/dL 9.7(L) 9.0(L) 7.5(L)  Hematocrit 39 - 52 % 31.4(L) 27.4(L) 22.8(L)  Platelets 150 - 400 K/uL 433(H) 546(H) 573(H)   CMP Latest Ref Rng & Units 05/21/2020 08/16/2019 08/12/2019  Glucose 70 - 99 mg/dL 115(H) 140(H) 154(H)  BUN 8 - 23 mg/dL 30(H) 32(H) 32(H)  Creatinine 0.61 - 1.24 mg/dL 2.28(H) 1.65(H) 1.61(H)  Sodium 135 - 145 mmol/L 120(L) 134(L) 136  Potassium 3.5 - 5.1 mmol/L 4.0 4.0 4.0  Chloride 98 - 111 mmol/L 92(L) 105 104  CO2 22 - 32 mmol/L 18(L) 21(L) 21(L)  Calcium 8.9 - 10.3 mg/dL 8.7(L) 8.9 10.8(H)  Total Protein 6.5 - 8.1 g/dL - - 7.7  Total Bilirubin 0.3 - 1.2 mg/dL - - 0.5  Alkaline Phos 38 - 126 U/L - - 40  AST 15 - 41 U/L - - 19  ALT 0 - 44 U/L - - 16  Component Value Date/Time   RBC 3.42 (L) 07/14/2020 1443   MCV 91.8 07/14/2020 1443   MCH 28.4 07/14/2020 1443   MCHC 30.9 07/14/2020 1443   RDW 15.9 (H) 07/14/2020 1443   LYMPHSABS 1.5 07/14/2020 1443   MONOABS 0.9 07/14/2020 1443   EOSABS 0.0 07/14/2020 1443   BASOSABS 0.0 07/14/2020 1443   Lab Results  Component Value Date   TIBC 308 07/14/2020   TIBC 328 05/21/2020   TIBC 268 11/13/2014   FERRITIN 289 07/14/2020   FERRITIN 127 05/21/2020   FERRITIN 384 (H) 07/06/2015   IRONPCTSAT 7 (L) 07/14/2020   IRONPCTSAT 7 (L) 05/21/2020   IRONPCTSAT 16 (L) 11/13/2014    DIAGNOSTIC IMAGING:  I have independently reviewed the scans and discussed with the  patient. US RENAL  Result Date: 06/30/2020 CLINICAL DATA:  Chronic kidney disease. EXAM: RENAL / URINARY TRACT ULTRASOUND COMPLETE COMPARISON:  None. FINDINGS: Right Kidney: Renal measurements: 13.2 x 7.5 x 7.3 cm = volume: 376 mL. Two cysts are noted, the largest measuring 4.6 cm involving the lower pole. 1.1 cm nonobstructive calculus is noted in midpole. Echogenicity within normal limits. No mass or hydronephrosis visualized. Left Kidney: Renal measurements: 11.7 x 6.7 x 6.6 cm = volume: 273 mL. Two simple cysts are noted, the largest measuring 6.6 cm in the upper pole. Echogenicity within normal limits. No mass or hydronephrosis visualized. Bladder: Appears normal for degree of bladder distention. Bilateral ureteral jets are noted. Other: None. IMPRESSION: Nonobstructive right renal calculus. Bilateral simple renal cysts are noted. No hydronephrosis or renal obstruction is noted. Electronically Signed   By: Marijo Conception M.D.   On: 06/30/2020 15:48   ECHOCARDIOGRAM COMPLETE  Result Date: 07/14/2020    ECHOCARDIOGRAM REPORT   Patient Name:   BIAGIO SNELSON [facility] Date of Exam: 07/14/2020 Medical Rec #:  [id number]            Height:       69.0 in Accession #:    [id number]           Weight:       208.7 lb Date of Birth:  1956-04-11            BSA:          2.104 m Patient Age:    25 years             BP:           146/86 mmHg Patient Gender: M                    HR:           80 bpm. Exam Location:  [facility] Procedure: 2D Echo, Cardiac Doppler and Color Doppler Indications:    E11.22 (ICD-10-CM) - Type 2 diabetes mellitus with diabetic                 chronic kidney disease, unspecified CKD stage, unspecified                 whether long term insulin use  History:        Patient has no prior history of Echocardiogram examinations.                 Risk Factors:Hypertension, Diabetes and Dyslipidemia. Anemia.  Sonographer:    Alvino Chapel RCS Referring Phys: [place]  1. Left  ventricular ejection fraction, by estimation, is 60 to 65%. The left ventricle has normal function.  The left ventricle has no regional wall motion abnormalities. There is moderate left ventricular hypertrophy. Left ventricular diastolic parameters are consistent with Grade I diastolic dysfunction (impaired relaxation).  2. Right ventricular systolic function is normal. The right ventricular size is normal.  3. Left atrial size was mildly dilated.  4. The mitral valve is normal in structure. No evidence of mitral valve regurgitation. No evidence of mitral stenosis.  5. The aortic valve is tricuspid. There is mild calcification of the aortic valve. There is mild thickening of the aortic valve. Aortic valve regurgitation is mild. No aortic stenosis is present.  6. The inferior vena cava is normal in size with greater than 50% respiratory variability, suggesting right atrial pressure of 3 mmHg. FINDINGS  Left Ventricle: Left ventricular ejection fraction, by estimation, is 60 to 65%. The left ventricle has normal function. The left ventricle has no regional wall motion abnormalities. The left ventricular internal cavity size was normal in size. There is  moderate left ventricular hypertrophy. Left ventricular diastolic parameters are consistent with Grade I diastolic dysfunction (impaired relaxation). Right Ventricle: The right ventricular size is normal. No increase in right ventricular wall thickness. Right ventricular systolic function is normal. Left Atrium: Left atrial size was mildly dilated. Right Atrium: Right atrial size was normal in size. Pericardium: There is no evidence of pericardial effusion. Mitral Valve: The mitral valve is normal in structure. There is mild thickening of the mitral valve leaflet(s). There is mild calcification of the mitral valve leaflet(s). Mild mitral annular calcification. No evidence of mitral valve regurgitation. No evidence of mitral valve stenosis. Tricuspid Valve: The tricuspid  valve is normal in structure. Tricuspid valve regurgitation is not demonstrated. No evidence of tricuspid stenosis. Aortic Valve: The aortic valve is tricuspid. There is mild calcification of the aortic valve. There is mild thickening of the aortic valve. There is mild aortic valve annular calcification. Aortic valve regurgitation is mild. No aortic stenosis is present. Aortic valve mean gradient measures 7.0 mmHg. Aortic valve peak gradient measures 14.0 mmHg. Aortic valve area, by VTI measures 2.24 cm. Pulmonic Valve: The pulmonic valve was not well visualized. Pulmonic valve regurgitation is not visualized. No evidence of pulmonic stenosis. Aorta: The aortic root is normal in size and structure. Venous: The inferior vena cava is normal in size with greater than 50% respiratory variability, suggesting right atrial pressure of 3 mmHg. IAS/Shunts: No atrial level shunt detected by color flow Doppler.  LEFT VENTRICLE PLAX 2D LVIDd:         4.84 cm      Diastology LVIDs:         2.29 cm      LV e' medial:    7.29 cm/s LV PW:         1.41 cm      LV E/e' medial:  12.6 LV IVS:        1.47 cm      LV e' lateral:   9.46 cm/s LVOT diam:     2.00 cm      LV E/e' lateral: 9.7 LV SV:         82 LV SV Index:   39 LVOT Area:     3.14 cm  LV Volumes (MOD) LV vol d, MOD A2C: 87.8 ml LV vol d, MOD A4C: 114.0 ml LV vol s, MOD A2C: 37.6 ml LV vol s, MOD A4C: 48.1 ml LV SV MOD A2C:     50.2 ml LV SV MOD A4C:  114.0 ml LV SV MOD BP:      60.1 ml RIGHT VENTRICLE RV S prime:     15.15 cm/s TAPSE (M-mode): 2.0 cm LEFT ATRIUM             Index       RIGHT ATRIUM           Index LA diam:        4.50 cm 2.14 cm/m  RA Area:     22.10 cm LA Vol (A2C):   92.1 ml 43.78 ml/m RA Volume:   68.70 ml  32.66 ml/m LA Vol (A4C):   54.9 ml 26.10 ml/m LA Biplane Vol: 71.8 ml 34.13 ml/m  AORTIC VALVE AV Area (Vmax):    2.30 cm AV Area (Vmean):   2.15 cm AV Area (VTI):     2.24 cm AV Vmax:           187.00 cm/s AV Vmean:          125.000 cm/s  AV VTI:            0.366 m AV Peak Grad:      14.0 mmHg AV Mean Grad:      7.0 mmHg LVOT Vmax:         137.00 cm/s LVOT Vmean:        85.700 cm/s LVOT VTI:          0.261 m LVOT/AV VTI ratio: 0.71  AORTA Ao Root diam: 3.50 cm MITRAL VALVE MV Area (PHT): 3.17 cm     SHUNTS MV Decel Time: 239 msec     Systemic VTI:  0.26 m MV E velocity: 91.90 cm/s   Systemic Diam: 2.00 cm MV A velocity: 121.00 cm/s MV E/A ratio:  0.76 Carlyle Dolly MD Electronically signed by Carlyle Dolly MD Signature Date/Time: 07/14/2020/3:53:01 PM    Final      ASSESSMENT:  1. Normocytic anemia: -CBC on 05/21/2020 shows hemoglobin 7.5 with MCV of 88.7. White count was elevated at 11.2 and platelet count 573. -Ferritin was 127 with percent saturation of 7. -EGD on 05/25/2020 shows normal esophagus, small hiatal hernia, normal duodenum. He had 2 gastric ulcers, the largest measuring 1.75 cm deep benign-appearing prepyloric antral ulcer with no bleeding stigmata. -Biopsy of the ulcer shows nonspecific reactive gastropathy, negative for H. pylori. -He reports that he was taking Aleve every night for arthritic pains, stopped couple of days prior to EGD. -Labs on 06/01/2020 showed SPEP was negative.  B12, methylmalonic acid and copper levels were normal.  LDH was normal.  Free kappa light chains are 53.6, lambda light chain 62.9 and ratio of 0.85.  Immunofixation was negative. -Feraheme on 06/05/2020 and 06/12/2020.  2. Social/family history: -He is a non-smoker. 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 -Father had lung cancer and mother had metastatic colon cancer.   PLAN:  1. Normocytic anemia: -Combination anemia from CKD and gastric ulcer. -His energy levels improved after Feraheme infusions.  CBC on 07/13/2020 showed hemoglobin 9.7. -We will plan to see him back in 3 months for follow-up with repeat CBC, ferritin and iron panel. -We will consider erythropoiesis stimulating agents only if parenteral iron therapy not  helping.  2. Leukocytosis: -He has intermittent leukocytosis since 2016.  He is a non-smoker. -JAK2 V617F mutation and BCR/ABL were negative.  Latest CBC shows white count improved to 10.5.  3. CKD: -Follow-up with Dr. Theador Hawthorne.  4. Hypertension: -Continue lisinopril/HCTZ and atenolol and amlodipine.  Orders placed this encounter:  No  orders of the defined types were placed in this encounter.    Derek Jack, MD [place] [phone]   I, Milinda Antis, am acting as a scribe for Dr. Sanda Linger.  I, Derek Jack MD, have reviewed the above documentation for accuracy and completeness, and I agree with the above.

## 2020-07-22 IMAGING — DX DG KNEE 1-2V PORT*L*
2 series · 2 of 2 positions shown · non-contrast
Comparison: None.

CLINICAL DATA: Status post left knee replacement.

EXAM:
PORTABLE LEFT KNEE - 1-2 VIEW

[knee ap]
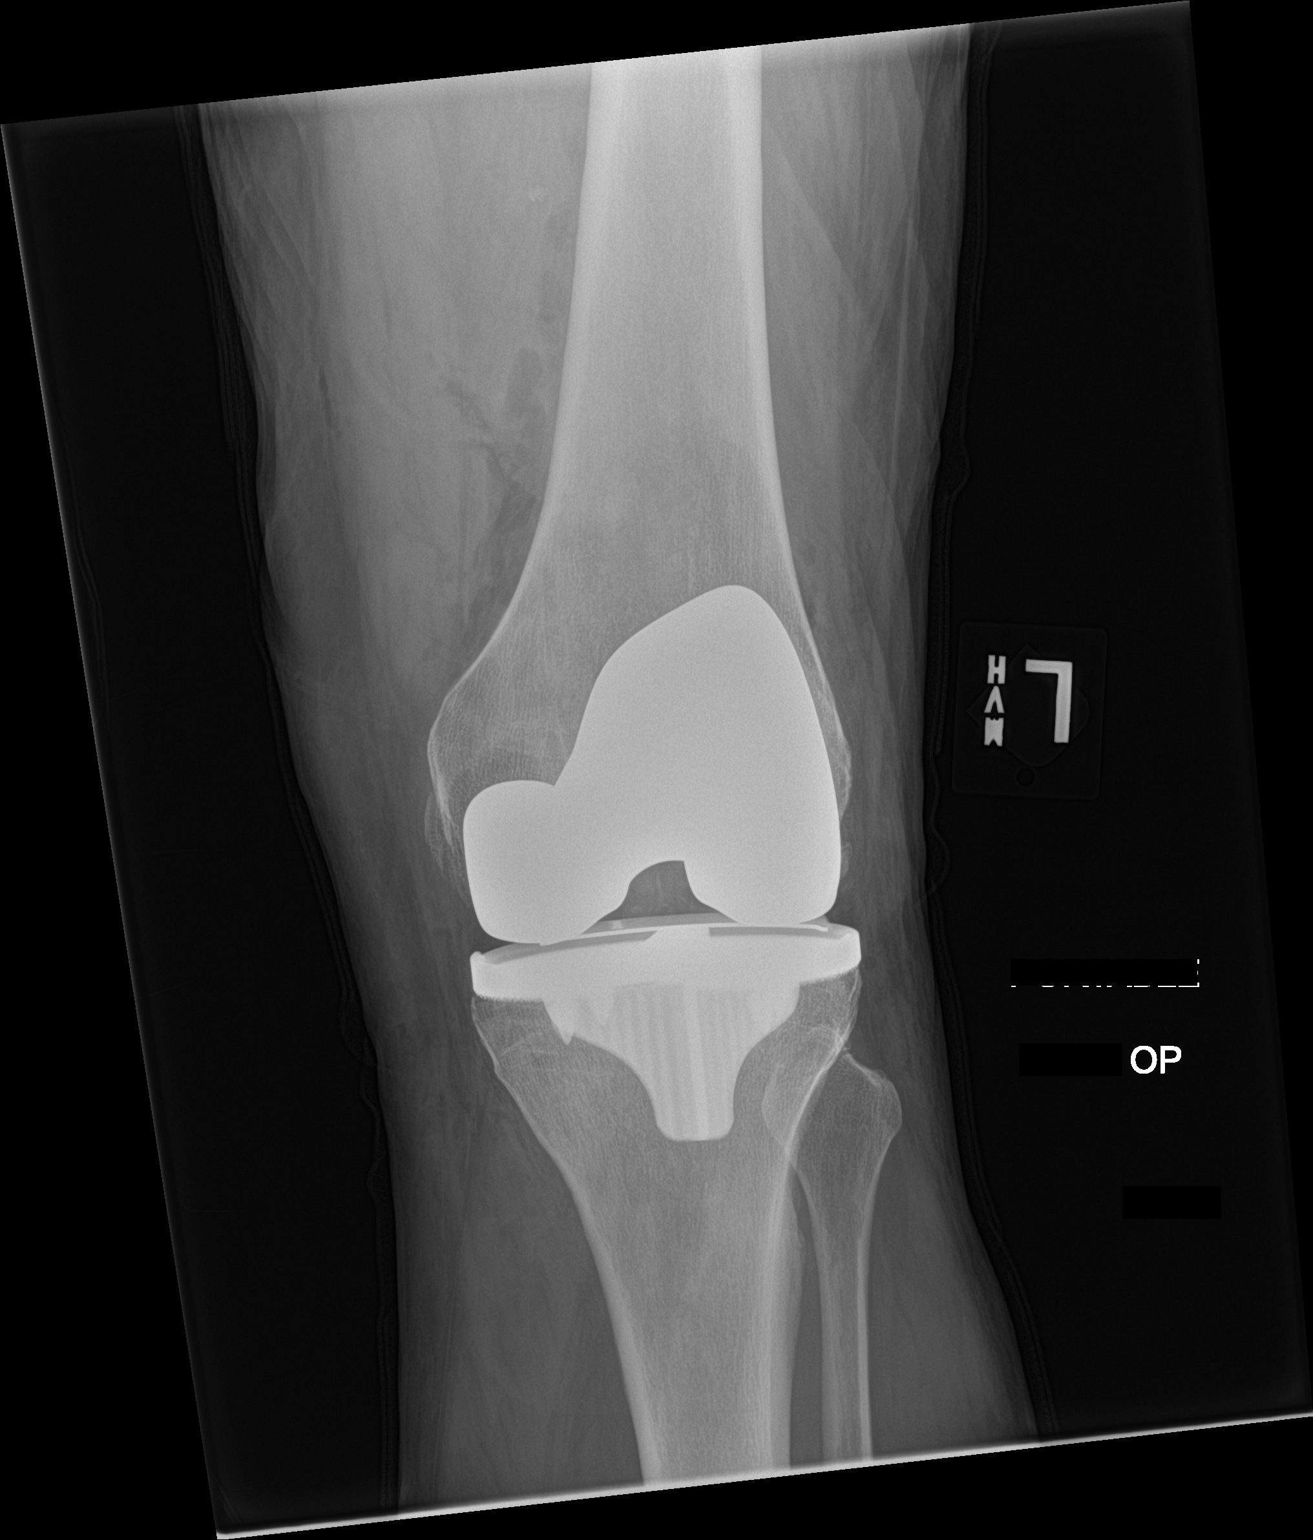

[knee lat]
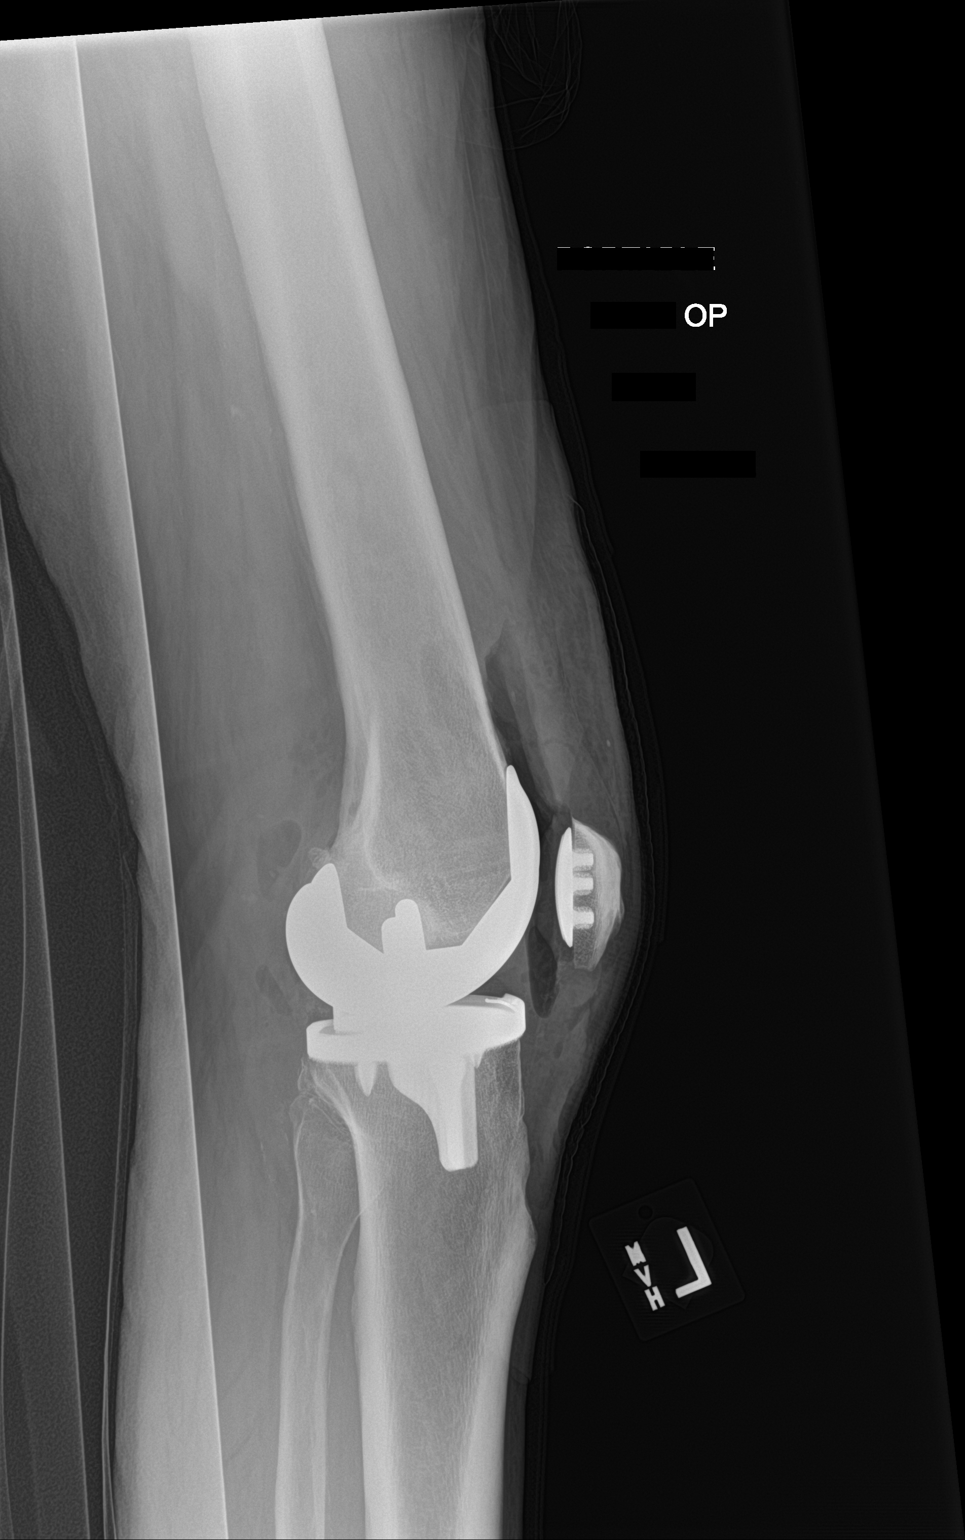

[2 of 2 positions shown; findings below may reference images not displayed]

FINDINGS: The left femoral and tibial components appear to be well situated.
Expected postoperative changes are seen in the soft tissues
anteriorly.
IMPRESSION: Status post left total knee arthroplasty.

## 2020-08-25 ENCOUNTER — Encounter: Payer: Self-pay | Admitting: Gastroenterology

## 2020-08-25 ENCOUNTER — Encounter: Payer: Self-pay | Admitting: *Deleted

## 2020-08-25 ENCOUNTER — Ambulatory Visit (INDEPENDENT_AMBULATORY_CARE_PROVIDER_SITE_OTHER): Payer: 59 | Admitting: Gastroenterology

## 2020-08-25 ENCOUNTER — Other Ambulatory Visit: Payer: Self-pay

## 2020-08-25 VITALS — BP 156/90 | HR 75 | Temp 97.5°F | Ht 69.0 in | Wt 204.4 lb

## 2020-08-25 DIAGNOSIS — K259 Gastric ulcer, unspecified as acute or chronic, without hemorrhage or perforation: Secondary | ICD-10-CM | POA: Insufficient documentation

## 2020-08-25 DIAGNOSIS — D508 Other iron deficiency anemias: Secondary | ICD-10-CM

## 2020-08-25 NOTE — Progress Notes (Signed)
Primary Care Physician:  Everardo Beals, NP  Primary Gastroenterologist:  Garfield Cornea, MD   Chief Complaint  Patient presents with  . Anemia    f/u. due for repeat EGD. Rpeorts doing fine    HPI:  Bryan Atkinson is a 64 y.o. male here for follow-up.  He was seen back in August for further evaluation of anemia.  Hemoglobin had dropped from 12.4-8.3 between March and July of this year.  He was evaluated in 2014 by hematology for significant anemia, felt to be anemia of chronic disease at that time.  When he was last seen, he was noted to have nausea, diminished appetite and abdominal discomfort in the setting of NSAID use.  Colonoscopy was up-to-date (2019) therefore only upper endoscopy was offered.  EGD completed in August which showed 2 gastric ulcers, benign biopsies.  No H. pylori.  Pantoprazole was increased to twice daily and advised 1-monthfollow-up EGD.  patient was advised to refrain from NSAID use.  Labs from September showed hemoglobin 9.7, hematocrit 31.4, ferritin 289, iron 21, TIBC 308, iron saturation 7%.  He has been followed by Dr. KDelton Coombesfor iron infusions as well.  Plans to see him back in January 2022.  Patient states he continues to follow with nephrology.  States it was felt that NSAIDs likely contributed to his acute kidney injury.  He reports his kidney function has improved since stopping NSAIDs.  From a GI standpoint he has no complaints.  Right upper quadrant abdominal discomfort resolved.  None in 3 weeks.  Denies melena or rectal bleeding.  No heartburn.  Bowel movements regular.     Current Outpatient Medications  Medication Sig Dispense Refill  . allopurinol (ZYLOPRIM) 300 MG tablet Take 300 mg by mouth daily.    .Marland KitchenamLODipine (NORVASC) 10 MG tablet Take 10 mg by mouth daily.     .Marland KitchenApple Cider Vinegar 500 MG TABS Take 500 mg by mouth daily.     .Marland Kitchenaspirin EC 81 MG tablet Take 81 mg by mouth daily. Swallow whole.    .Marland Kitchenatenolol (TENORMIN) 50 MG  tablet Take 50 mg by mouth 2 (two) times daily.     .Marland KitchenCo-Enzyme Q10 100 MG CAPS Take 100 mg by mouth daily.     . Cranberry 500 MG TABS Take 500 mg by mouth daily.    .Mariane BaumgartenSodium (DSS) 100 MG CAPS Stool Softener 100 mg capsule  TAKE 1 CAPSULE BY MOUTH TWICE A DAY    . fenofibrate (TRICOR) 145 MG tablet Take 145 mg by mouth daily.    . ferrous sulfate 325 (65 FE) MG tablet Take 325 mg by mouth daily with breakfast.    . Flaxseed, Linseed, (FLAXSEED OIL) 1000 MG CAPS Take 1,000 mg by mouth daily.    .Marland KitchenglipiZIDE (GLUCOTROL XL) 10 MG 24 hr tablet Take 10 mg by mouth daily.    .Marland Kitchenglucose blood test strip 1 each by Other route as needed for other. Use as instructed    . Krill Oil 500 MG CAPS Take 500 mg by mouth 2 (two) times a day.    . lisinopril (ZESTRIL) 20 MG tablet Take 20 mg by mouth daily.    . methocarbamol (ROBAXIN) 500 MG tablet methocarbamol 500 mg tablet  TAKE 1 TABLET BY MOUTH EVERY DAY AS NEEDED    . Multiple Vitamin (MULTIVITAMIN) tablet Take 1 tablet by mouth daily.    . niacin (NIASPAN) 1000 MG CR tablet Take 1,000 mg by  mouth at bedtime.    . pantoprazole (PROTONIX) 40 MG tablet TAKE 1 TABLET BY MOUTH EVERY DAY TWICE A DAY 60 tablet 5  . pravastatin (PRAVACHOL) 40 MG tablet Take 40 mg by mouth at bedtime.     . traMADol (ULTRAM) 50 MG tablet tramadol 50 mg tablet  TAKE 1 TABLET EVERY 4 HOURS BY ORAL ROUTE AS NEEDED FOR 5 DAYS.    Marland Kitchen traZODone (DESYREL) 50 MG tablet trazodone 50 mg tablet  TAKE 1 TABLET BY MOUTH EVERYDAY AT BEDTIME    . triamcinolone cream (KENALOG) 0.1 % triamcinolone acetonide 0.1 % topical cream  APPLY THIN COAT TO AFFECTED AREAS TWICE A DAY    . Turmeric 500 MG CAPS Take 500 mg by mouth daily.     No current facility-administered medications for this visit.    Allergies as of 08/25/2020  . (No Known Allergies)    Past Medical History:  Diagnosis Date  . Arthritis   . Diabetes mellitus without complication ([place])   . Gout   . History of kidney  stones   . Hyperlipidemia   . Hypertension     Past Surgical History:  Procedure Laterality Date  . BIOPSY  05/25/2020   Procedure: BIOPSY;  Surgeon: Daneil Dolin, MD;  [facility];  Service: Endoscopy;;  . CATARACT EXTRACTION W/PHACO Right 05/13/2019   Procedure: CATARACT EXTRACTION PHACO AND INTRAOCULAR LENS PLACEMENT ([place]);  Surgeon: Baruch Goldmann, MD;  Location: [facility];  Service: Ophthalmology;  Laterality: Right;  CDE: 10.31  . CATARACT EXTRACTION W/PHACO Left 05/27/2019   Procedure: CATARACT EXTRACTION PHACO AND INTRAOCULAR LENS PLACEMENT ([facility]);  Surgeon: Baruch Goldmann, MD;  Location: [facility];  Service: Ophthalmology;  Laterality: Left;  CDE: 6.49  . COLONOSCOPY     times two 2002 ,2007  . COLONOSCOPY N/A 10/07/2013   Procedure: COLONOSCOPY;  Surgeon: Daneil Dolin, MD;  [facility];  Service: Endoscopy;  Laterality: N/A;  8:30 AM  . COLONOSCOPY N/A 10/05/2018   Dr. Gala Romney: Diverticulosis.  Next colonoscopy 5 years due to first-degree relative with colon cancer  . ESOPHAGOGASTRODUODENOSCOPY (EGD) WITH PROPOFOL N/A 05/25/2020   [name]: 2 gastric ulcers.  No H. pylori.  Likely NSAID related.  Repeat EGD in 3 months  . JOINT REPLACEMENT    . KNEE ARTHROPLASTY Left 06/27/2019   Procedure: COMPUTER ASSISTED TOTAL KNEE ARTHROPLASTY;  Surgeon: Rod Can, MD;  Location: [facility];  Service: Orthopedics;  Laterality: Left;  . KNEE ARTHROPLASTY Right 08/15/2019   Procedure: COMPUTER ASSISTED TOTAL KNEE ARTHROPLASTY;  Surgeon: Rod Can, MD;  Location: [facility];  Service: Orthopedics;  Laterality: Right;  . LEG SURGERY Right early 68's    Family History  Problem Relation Age of Onset  . Colon cancer Mother        She died from her malignancy, 53  . Lung cancer Father        Died from lung cancer, age 14  . Emphysema Brother        Died at 85 from lung disease  . COPD Brother   . Dementia Sister   . Healthy Son   . Healthy Daughter     Social History    Socioeconomic History  . Marital status: Married    Spouse name: Not on file  . Number of children: 2  . Years of education: Not on file  . Highest education level: Not on file  Occupational History  . 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 Smoking status: Never Smoker  . Smokeless tobacco: Never Used  Vaping Use  . Vaping Use: Never used  Substance and Sexual Activity  . Alcohol use: Yes    Comment: occasionally  . Drug use: No  . Sexual activity: Yes  Other Topics Concern  . Not on file  Social History Narrative   Daughter is 80, Son is 4.  3 grandchildren. Married for 34 years   Social Determinants of Radio broadcast assistant Strain:   . Difficulty of Paying Living Expenses: Not on file  Food Insecurity:   . Worried About Charity fundraiser in the Last Year: Not on file  . Ran Out of Food in the Last Year: Not on file  Transportation Needs:   . Lack of Transportation (Medical): Not on file  . Lack of Transportation (Non-Medical): Not on file  Physical Activity:   . Days of Exercise per Week: Not on file  . Minutes of Exercise per Session: Not on file  Stress:   . Feeling of Stress : Not on file  Social Connections:   . Frequency of Communication with Friends and Family: Not on file  . Frequency of Social Gatherings with Friends and Family: Not on file  . Attends Religious Services: Not on file  . Active Member of Clubs or Organizations: Not on file  . Attends Archivist Meetings: Not on file  . Marital Status: Not on file  Intimate Partner Violence:   . Fear of Current or Ex-Partner: Not on file  . Emotionally Abused: Not on file  . Physically Abused: Not on file  . Sexually Abused: Not on file      ROS:  General: Negative for anorexia, weight loss, fever, chills, fatigue, weakness. Eyes: Negative for vision changes.  ENT: Negative for hoarseness, difficulty swallowing ,  nasal congestion. CV: Negative for chest pain, angina, palpitations, dyspnea on exertion, peripheral edema.  Respiratory: Negative for dyspnea at rest, dyspnea on exertion, cough, sputum, wheezing.  GI: See history of present illness. GU:  Negative for dysuria, hematuria, urinary incontinence, urinary frequency, nocturnal urination.  MS: Negative for joint pain, low back pain.  Derm: Negative for rash or itching.  Neuro: Negative for weakness, abnormal sensation, seizure, frequent headaches, memory loss, confusion.  Psych: Negative for anxiety, depression, suicidal ideation, hallucinations.  Endo: Negative for unusual weight change.  Heme: Negative for bruising or bleeding. Allergy: Negative for rash or hives.    Physical Examination:  BP (!) 156/90   Pulse 75   Temp (!) 97.5 F (36.4 C)   Ht _0  (1.753 m)   Wt 204 lb 6.4 oz (92.7 kg)   BMI 30.18 kg/m    General: Well-nourished, well-developed in no acute distress.  Head: Normocephalic, atraumatic.   Eyes: Conjunctiva pink, no icterus. Mouth: masked Neck: Supple without thyromegaly, masses, or lymphadenopathy.  Lungs: Clear to auscultation bilaterally.  Heart: Regular rate and rhythm, no murmurs rubs or gallops.  Abdomen: Bowel sounds are normal, nontender, nondistended, no hepatosplenomegaly or masses, no abdominal bruits or    hernia , no rebound or guarding.   Rectal: not performed Extremities: No lower extremity edema. No clubbing or deformities.  Neuro: Alert and oriented x 4 , grossly normal neurologically.  Skin: Warm and dry, no rash or jaundice.   Psych: Alert and cooperative, normal mood and affect.  Labs: Lab Results  Component Value  Date   WBC 10.5 07/14/2020   HGB 9.7 (L) 07/14/2020   HCT 31.4 (L) 07/14/2020   MCV 91.8 07/14/2020   PLT 433 (H) 07/14/2020   Lab Results  Component Value Date   IRON 21 (L) 07/14/2020   TIBC 308 07/14/2020   FERRITIN 289 07/14/2020   Lab Results  Component Value Date    VITAMINB12 789 06/01/2020   Lab Results  Component Value Date   FOLATE 21.8 06/01/2020   Labs from June 23, 2020 in care everywhere: Creatinine 1.68, estimated GFR non-African 43, total bilirubin 0.3, alk phos 65, AST 23, ALT 18, A1c 5.2   Imaging Studies: No results found.  Impression/plan:  Pleasant 64 year old male presenting today for follow-up of gastric ulcers.  Due for 30-monthsurveillance EGD.  Ulcers felt to be NSAID related, patient no longer on Aleve.  He has had modest improvement in his hemoglobin, receiving iron infusions with Dr. KDelton Coombes has follow-up in January.  Also with element of anemia of chronic disease in the setting of chronic kidney disease with recent acute kidney injury in the setting of NSAIDs, which is now improving.  We will plan for upper endoscopy in the near future.  Deep sedation planned.  ASA II.  I have discussed the risks, alternatives, benefits with regards to but not limited to the risk of reaction to medication, bleeding, infection, perforation and the patient is agreeable to proceed. Written consent to be obtained.  Continue pantoprazole twice daily until EGD findings.  Follow-up labs in January.  If patient continues to require iron and/or hemoglobin does not improve, he may require additional GI work-up.  Last colonoscopy was in December 2019.

## 2020-08-25 NOTE — Patient Instructions (Signed)
1. We will follow up on labs done by Dr. Delton Coombes as available. 2. Upper endoscopy as scheduled. See separate instructions.

## 2020-08-25 NOTE — H&P (View-Only) (Signed)
Primary Care Physician:  Everardo Beals, NP  Primary Gastroenterologist:  Garfield Cornea, MD   Chief Complaint  Patient presents with  . Anemia    f/u. due for repeat EGD. Rpeorts doing fine    HPI:  Bryan Atkinson is a 64 y.o. male here for follow-up.  He was seen back in August for further evaluation of anemia.  Hemoglobin had dropped from 12.4-8.3 between March and July of this year.  He was evaluated in 2014 by hematology for significant anemia, felt to be anemia of chronic disease at that time.  When he was last seen, he was noted to have nausea, diminished appetite and abdominal discomfort in the setting of NSAID use.  Colonoscopy was up-to-date (2019) therefore only upper endoscopy was offered.  EGD completed in August which showed 2 gastric ulcers, benign biopsies.  No H. pylori.  Pantoprazole was increased to twice daily and advised 1-monthfollow-up EGD.  patient was advised to refrain from NSAID use.  Labs from September showed hemoglobin 9.7, hematocrit 31.4, ferritin 289, iron 21, TIBC 308, iron saturation 7%.  He has been followed by Dr. KDelton Coombesfor iron infusions as well.  Plans to see him back in January 2022.  Patient states he continues to follow with nephrology.  States it was felt that NSAIDs likely contributed to his acute kidney injury.  He reports his kidney function has improved since stopping NSAIDs.  From a GI standpoint he has no complaints.  Right upper quadrant abdominal discomfort resolved.  None in 3 weeks.  Denies melena or rectal bleeding.  No heartburn.  Bowel movements regular.     Current Outpatient Medications  Medication Sig Dispense Refill  . allopurinol (ZYLOPRIM) 300 MG tablet Take 300 mg by mouth daily.    .Marland KitchenamLODipine (NORVASC) 10 MG tablet Take 10 mg by mouth daily.     .Marland KitchenApple Cider Vinegar 500 MG TABS Take 500 mg by mouth daily.     .Marland Kitchenaspirin EC 81 MG tablet Take 81 mg by mouth daily. Swallow whole.    .Marland Kitchenatenolol (TENORMIN) 50 MG  tablet Take 50 mg by mouth 2 (two) times daily.     .Marland KitchenCo-Enzyme Q10 100 MG CAPS Take 100 mg by mouth daily.     . Cranberry 500 MG TABS Take 500 mg by mouth daily.    .Mariane BaumgartenSodium (DSS) 100 MG CAPS Stool Softener 100 mg capsule  TAKE 1 CAPSULE BY MOUTH TWICE A DAY    . fenofibrate (TRICOR) 145 MG tablet Take 145 mg by mouth daily.    . ferrous sulfate 325 (65 FE) MG tablet Take 325 mg by mouth daily with breakfast.    . Flaxseed, Linseed, (FLAXSEED OIL) 1000 MG CAPS Take 1,000 mg by mouth daily.    .Marland KitchenglipiZIDE (GLUCOTROL XL) 10 MG 24 hr tablet Take 10 mg by mouth daily.    .Marland Kitchenglucose blood test strip 1 each by Other route as needed for other. Use as instructed    . Krill Oil 500 MG CAPS Take 500 mg by mouth 2 (two) times a day.    . lisinopril (ZESTRIL) 20 MG tablet Take 20 mg by mouth daily.    . methocarbamol (ROBAXIN) 500 MG tablet methocarbamol 500 mg tablet  TAKE 1 TABLET BY MOUTH EVERY DAY AS NEEDED    . Multiple Vitamin (MULTIVITAMIN) tablet Take 1 tablet by mouth daily.    . niacin (NIASPAN) 1000 MG CR tablet Take 1,000 mg by  mouth at bedtime.    . pantoprazole (PROTONIX) 40 MG tablet TAKE 1 TABLET BY MOUTH EVERY DAY TWICE A DAY 60 tablet 5  . pravastatin (PRAVACHOL) 40 MG tablet Take 40 mg by mouth at bedtime.     . traMADol (ULTRAM) 50 MG tablet tramadol 50 mg tablet  TAKE 1 TABLET EVERY 4 HOURS BY ORAL ROUTE AS NEEDED FOR 5 DAYS.    Marland Kitchen traZODone (DESYREL) 50 MG tablet trazodone 50 mg tablet  TAKE 1 TABLET BY MOUTH EVERYDAY AT BEDTIME    . triamcinolone cream (KENALOG) 0.1 % triamcinolone acetonide 0.1 % topical cream  APPLY THIN COAT TO AFFECTED AREAS TWICE A DAY    . Turmeric 500 MG CAPS Take 500 mg by mouth daily.     No current facility-administered medications for this visit.    Allergies as of 08/25/2020  . (No Known Allergies)    Past Medical History:  Diagnosis Date  . Arthritis   . Diabetes mellitus without complication ([place])   . Gout   . History of kidney  stones   . Hyperlipidemia   . Hypertension     Past Surgical History:  Procedure Laterality Date  . BIOPSY  05/25/2020   Procedure: BIOPSY;  Surgeon: Daneil Dolin, MD;  [facility];  Service: Endoscopy;;  . CATARACT EXTRACTION W/PHACO Right 05/13/2019   Procedure: CATARACT EXTRACTION PHACO AND INTRAOCULAR LENS PLACEMENT ([place]);  Surgeon: Baruch Goldmann, MD;  Location: [facility];  Service: Ophthalmology;  Laterality: Right;  CDE: 10.31  . CATARACT EXTRACTION W/PHACO Left 05/27/2019   Procedure: CATARACT EXTRACTION PHACO AND INTRAOCULAR LENS PLACEMENT ([facility]);  Surgeon: Baruch Goldmann, MD;  Location: [facility];  Service: Ophthalmology;  Laterality: Left;  CDE: 6.49  . COLONOSCOPY     times two 2002 ,2007  . COLONOSCOPY N/A 10/07/2013   Procedure: COLONOSCOPY;  Surgeon: Daneil Dolin, MD;  [facility];  Service: Endoscopy;  Laterality: N/A;  8:30 AM  . COLONOSCOPY N/A 10/05/2018   Dr. Gala Romney: Diverticulosis.  Next colonoscopy 5 years due to first-degree relative with colon cancer  . ESOPHAGOGASTRODUODENOSCOPY (EGD) WITH PROPOFOL N/A 05/25/2020   [name]: 2 gastric ulcers.  No H. pylori.  Likely NSAID related.  Repeat EGD in 3 months  . JOINT REPLACEMENT    . KNEE ARTHROPLASTY Left 06/27/2019   Procedure: COMPUTER ASSISTED TOTAL KNEE ARTHROPLASTY;  Surgeon: Rod Can, MD;  Location[facility];  Service: Orthopedics;  Laterality: Left;  . KNEE ARTHROPLASTY Right 08/15/2019   Procedure: COMPUTER ASSISTED TOTAL KNEE ARTHROPLASTY;  Surgeon: Rod Can, MD;  Location[facility];  Service: Orthopedics;  Laterality: Right;  . LEG SURGERY Right early 68's    Family History  Problem Relation Age of Onset  . Colon cancer Mother        She died from her malignancy, 53  . Lung cancer Father        Died from lung cancer, age 14  . Emphysema Brother        Died at 85 from lung disease  . COPD Brother   . Dementia Sister   . Healthy Son   . Healthy Daughter     Social History    Socioeconomic History  . Marital status: Married    Spouse name: Not on file  . Number of children: 2  . Years of education: Not on file  . Highest education level: Not on file  Occupational History  . 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 Smoking status: Never Smoker  . Smokeless tobacco: Never Used  Vaping Use  . Vaping Use: Never used  Substance and Sexual Activity  . Alcohol use: Yes    Comment: occasionally  . Drug use: No  . Sexual activity: Yes  Other Topics Concern  . Not on file  Social History Narrative   Daughter is 80, Son is 4.  3 grandchildren. Married for 34 years   Social Determinants of Radio broadcast assistant Strain:   . Difficulty of Paying Living Expenses: Not on file  Food Insecurity:   . Worried About Charity fundraiser in the Last Year: Not on file  . Ran Out of Food in the Last Year: Not on file  Transportation Needs:   . Lack of Transportation (Medical): Not on file  . Lack of Transportation (Non-Medical): Not on file  Physical Activity:   . Days of Exercise per Week: Not on file  . Minutes of Exercise per Session: Not on file  Stress:   . Feeling of Stress : Not on file  Social Connections:   . Frequency of Communication with Friends and Family: Not on file  . Frequency of Social Gatherings with Friends and Family: Not on file  . Attends Religious Services: Not on file  . Active Member of Clubs or Organizations: Not on file  . Attends Archivist Meetings: Not on file  . Marital Status: Not on file  Intimate Partner Violence:   . Fear of Current or Ex-Partner: Not on file  . Emotionally Abused: Not on file  . Physically Abused: Not on file  . Sexually Abused: Not on file      ROS:  General: Negative for anorexia, weight loss, fever, chills, fatigue, weakness. Eyes: Negative for vision changes.  ENT: Negative for hoarseness, difficulty swallowing ,  nasal congestion. CV: Negative for chest pain, angina, palpitations, dyspnea on exertion, peripheral edema.  Respiratory: Negative for dyspnea at rest, dyspnea on exertion, cough, sputum, wheezing.  GI: See history of present illness. GU:  Negative for dysuria, hematuria, urinary incontinence, urinary frequency, nocturnal urination.  MS: Negative for joint pain, low back pain.  Derm: Negative for rash or itching.  Neuro: Negative for weakness, abnormal sensation, seizure, frequent headaches, memory loss, confusion.  Psych: Negative for anxiety, depression, suicidal ideation, hallucinations.  Endo: Negative for unusual weight change.  Heme: Negative for bruising or bleeding. Allergy: Negative for rash or hives.    Physical Examination:  BP (!) 156/90   Pulse 75   Temp (!) 97.5 F (36.4 C)   Ht _0  (1.753 m)   Wt 204 lb 6.4 oz (92.7 kg)   BMI 30.18 kg/m    General: Well-nourished, well-developed in no acute distress.  Head: Normocephalic, atraumatic.   Eyes: Conjunctiva pink, no icterus. Mouth: masked Neck: Supple without thyromegaly, masses, or lymphadenopathy.  Lungs: Clear to auscultation bilaterally.  Heart: Regular rate and rhythm, no murmurs rubs or gallops.  Abdomen: Bowel sounds are normal, nontender, nondistended, no hepatosplenomegaly or masses, no abdominal bruits or    hernia , no rebound or guarding.   Rectal: not performed Extremities: No lower extremity edema. No clubbing or deformities.  Neuro: Alert and oriented x 4 , grossly normal neurologically.  Skin: Warm and dry, no rash or jaundice.   Psych: Alert and cooperative, normal mood and affect.  Labs: Lab Results  Component Value  Date   WBC 10.5 07/14/2020   HGB 9.7 (L) 07/14/2020   HCT 31.4 (L) 07/14/2020   MCV 91.8 07/14/2020   PLT 433 (H) 07/14/2020   Lab Results  Component Value Date   IRON 21 (L) 07/14/2020   TIBC 308 07/14/2020   FERRITIN 289 07/14/2020   Lab Results  Component Value Date    VITAMINB12 789 06/01/2020   Lab Results  Component Value Date   FOLATE 21.8 06/01/2020   Labs from June 23, 2020 in care everywhere: Creatinine 1.68, estimated GFR non-African 43, total bilirubin 0.3, alk phos 65, AST 23, ALT 18, A1c 5.2   Imaging Studies: No results found.  Impression/plan:  Pleasant 64 year old male presenting today for follow-up of gastric ulcers.  Due for 30-monthsurveillance EGD.  Ulcers felt to be NSAID related, patient no longer on Aleve.  He has had modest improvement in his hemoglobin, receiving iron infusions with Dr. KDelton Coombes has follow-up in January.  Also with element of anemia of chronic disease in the setting of chronic kidney disease with recent acute kidney injury in the setting of NSAIDs, which is now improving.  We will plan for upper endoscopy in the near future.  Deep sedation planned.  ASA II.  I have discussed the risks, alternatives, benefits with regards to but not limited to the risk of reaction to medication, bleeding, infection, perforation and the patient is agreeable to proceed. Written consent to be obtained.  Continue pantoprazole twice daily until EGD findings.  Follow-up labs in January.  If patient continues to require iron and/or hemoglobin does not improve, he may require additional GI work-up.  Last colonoscopy was in December 2019.

## 2020-08-26 DIAGNOSIS — M48061 Spinal stenosis, lumbar region without neurogenic claudication: Secondary | ICD-10-CM | POA: Insufficient documentation

## 2020-08-31 ENCOUNTER — Encounter: Payer: Self-pay | Admitting: Gastroenterology

## 2020-09-09 IMAGING — DX DG KNEE 1-2V PORT*R*
2 series · 2 of 2 positions shown · non-contrast
Comparison: No prior.

CLINICAL DATA: Right knee arthroplasty.

EXAM:
PORTABLE RIGHT KNEE - 1-2 VIEW

[knee ap]
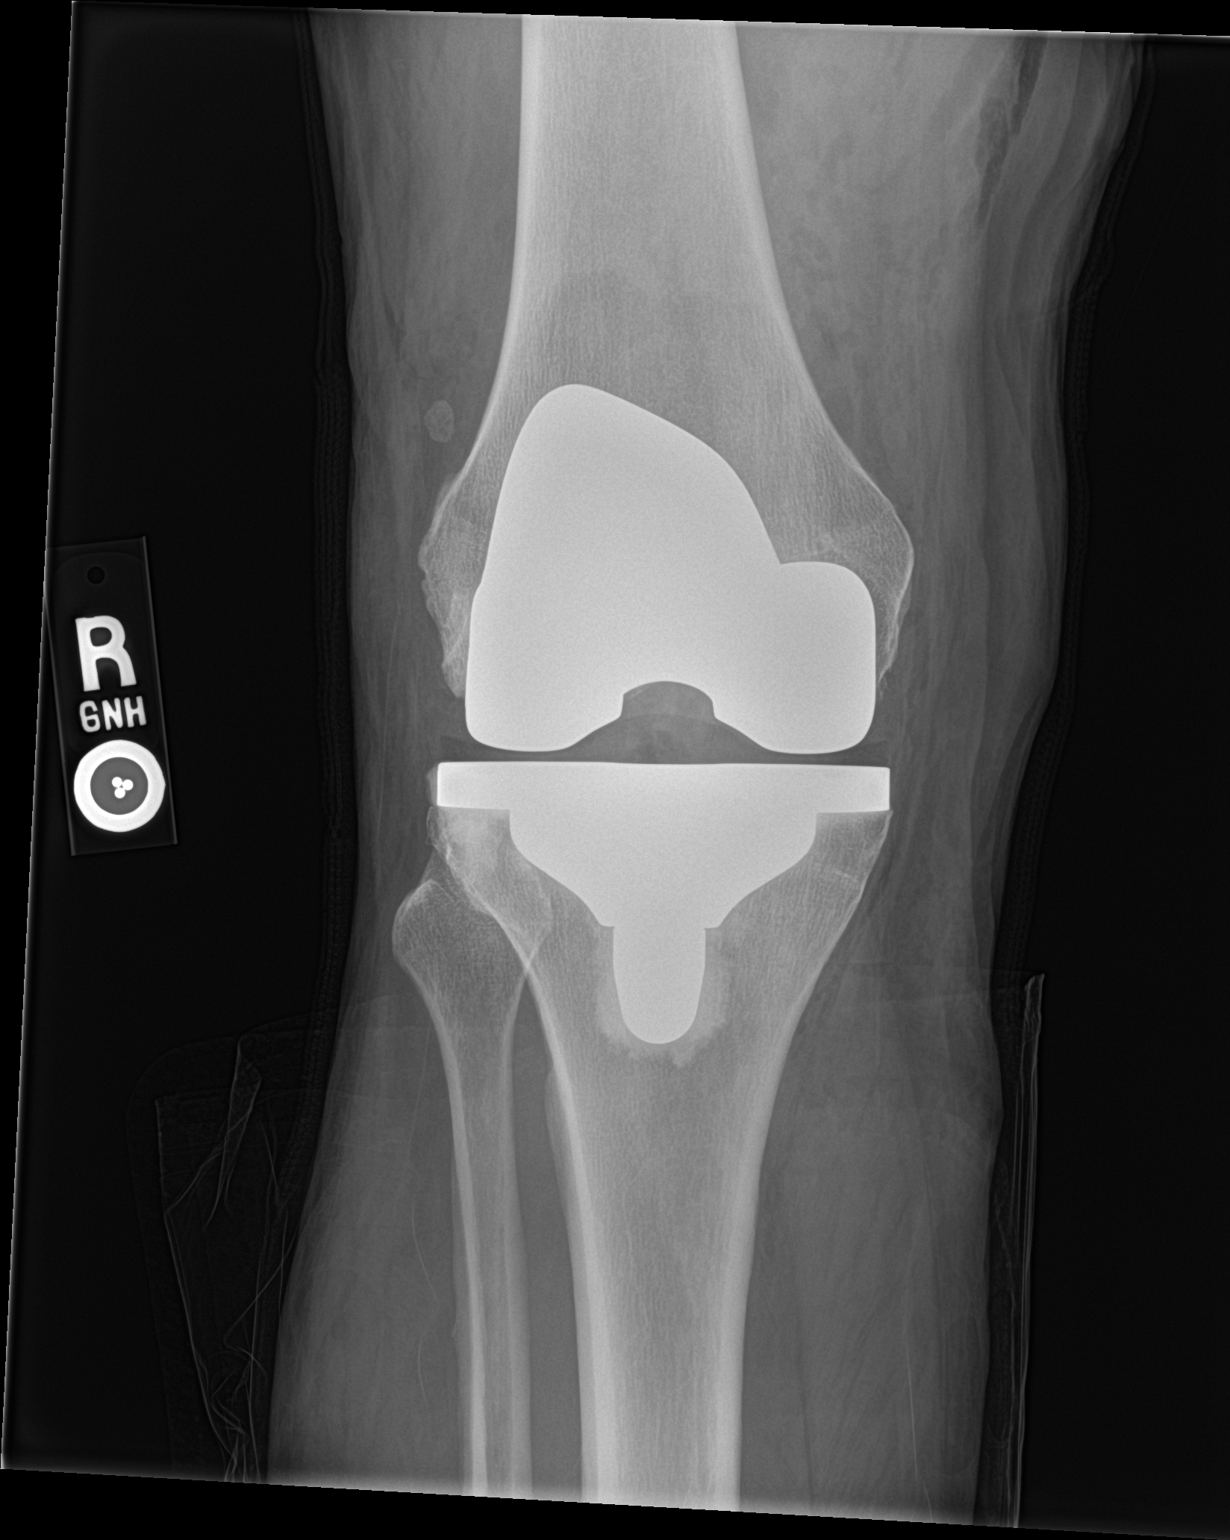

[knee lat]
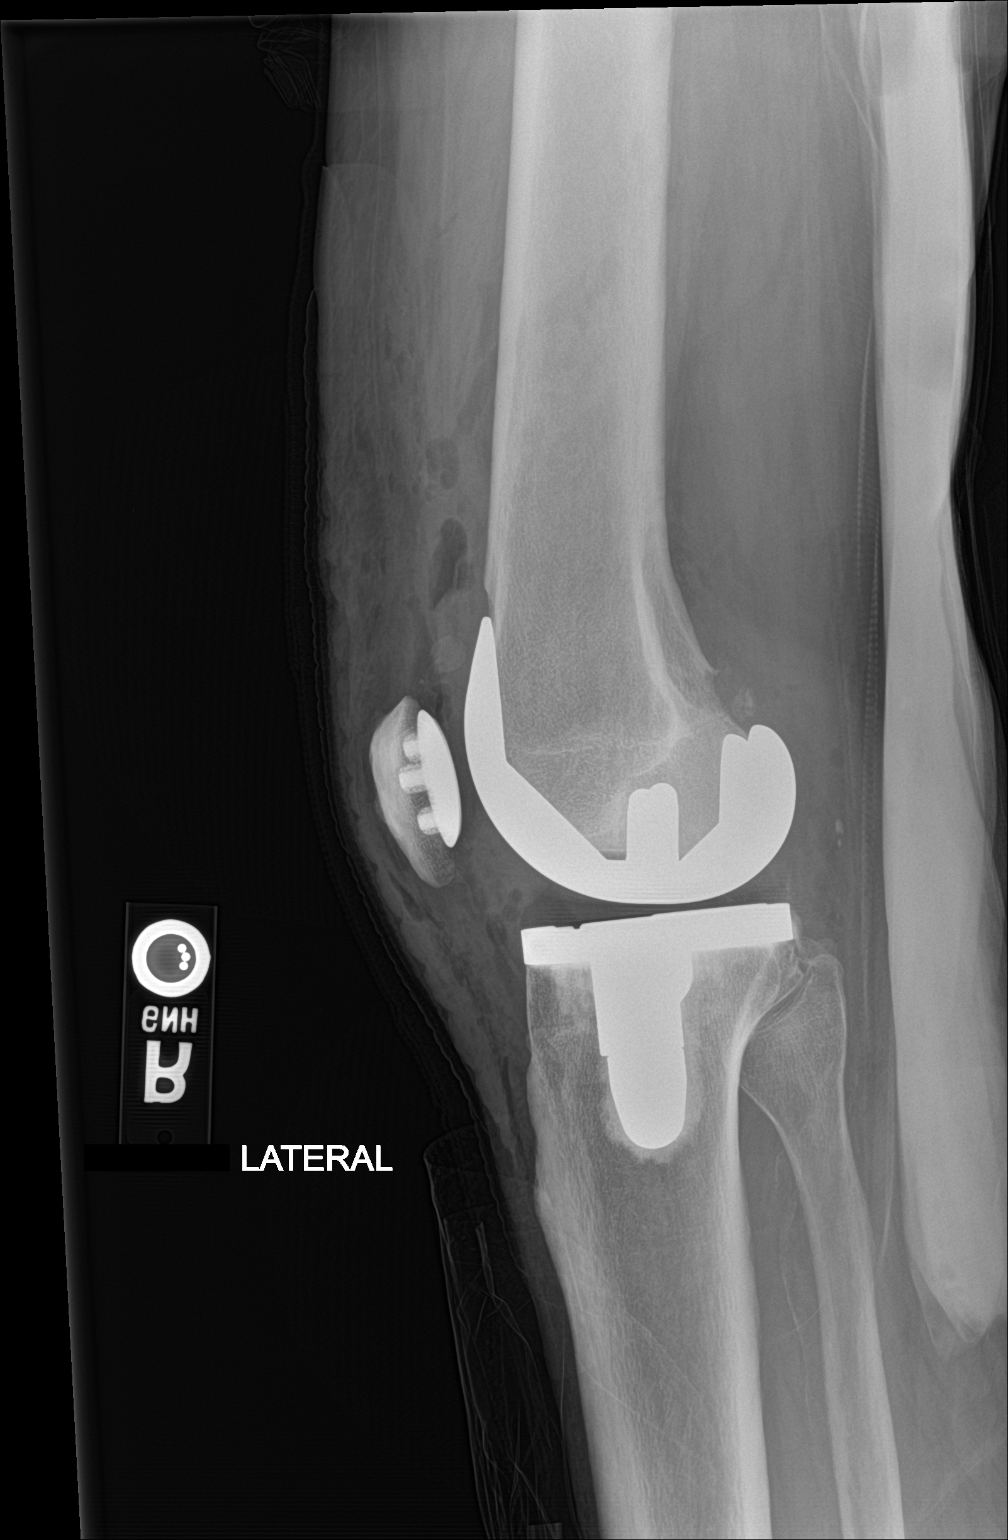

[2 of 2 positions shown; findings below may reference images not displayed]

FINDINGS: Total right knee replacement. Hardware intact. Anatomic alignment.
No acute bony abnormality.
IMPRESSION: Total right knee replacement.  Anatomic alignment.

## 2020-09-15 ENCOUNTER — Encounter (HOSPITAL_COMMUNITY): Payer: Self-pay | Admitting: Internal Medicine

## 2020-09-16 ENCOUNTER — Other Ambulatory Visit: Payer: Self-pay

## 2020-09-16 ENCOUNTER — Other Ambulatory Visit (HOSPITAL_COMMUNITY)
Admission: RE | Admit: 2020-09-16 | Discharge: 2020-09-16 | Disposition: A | Discharge status: Home / Self Care | Payer: 59 | Source: Ambulatory Visit | Attending: Internal Medicine | Admitting: Internal Medicine

## 2020-09-16 DIAGNOSIS — Z20822 Contact with and (suspected) exposure to covid-19: Secondary | ICD-10-CM | POA: Diagnosis not present

## 2020-09-16 DIAGNOSIS — Z01812 Encounter for preprocedural laboratory examination: Secondary | ICD-10-CM | POA: Diagnosis not present

## 2020-09-16 LAB — BASIC METABOLIC PANEL
Anion gap: 8 (ref 5–15)
BUN: 32 mg/dL — ABNORMAL HIGH (ref 8–23)
CO2: 24 mmol/L (ref 22–32)
Calcium: 9.2 mg/dL (ref 8.9–10.3)
Chloride: 107 mmol/L (ref 98–111)
Creatinine, Ser: 1.65 mg/dL — ABNORMAL HIGH (ref 0.61–1.24)
GFR, Estimated: 47 mL/min — ABNORMAL LOW (ref >60)
Glucose, Bld: 107 mg/dL — ABNORMAL HIGH (ref 70–99)
Potassium: 4.1 mmol/L (ref 3.5–5.1)
Sodium: 139 mmol/L (ref 135–145)

## 2020-09-16 LAB — SARS CORONAVIRUS 2 (TAT 6-24 HRS): SARS Coronavirus 2: NEGATIVE

## 2020-09-17 ENCOUNTER — Ambulatory Visit (HOSPITAL_COMMUNITY)
Admission: RE | Admit: 2020-09-17 | Discharge: 2020-09-17 | Disposition: A | Discharge status: Home / Self Care | Payer: 59 | Attending: Internal Medicine | Admitting: Internal Medicine

## 2020-09-17 ENCOUNTER — Other Ambulatory Visit: Payer: Self-pay

## 2020-09-17 ENCOUNTER — Ambulatory Visit (HOSPITAL_COMMUNITY): Payer: 59 | Admitting: Anesthesiology

## 2020-09-17 ENCOUNTER — Encounter (HOSPITAL_COMMUNITY): Payer: Self-pay | Admitting: Internal Medicine

## 2020-09-17 ENCOUNTER — Encounter (HOSPITAL_COMMUNITY)
Admission: RE | Disposition: A | Discharge status: Home / Self Care | Payer: Self-pay | Source: Home / Self Care | Attending: Internal Medicine

## 2020-09-17 DIAGNOSIS — Z79899 Other long term (current) drug therapy: Secondary | ICD-10-CM | POA: Insufficient documentation

## 2020-09-17 DIAGNOSIS — Z8719 Personal history of other diseases of the digestive system: Secondary | ICD-10-CM | POA: Diagnosis not present

## 2020-09-17 DIAGNOSIS — Z09 Encounter for follow-up examination after completed treatment for conditions other than malignant neoplasm: Secondary | ICD-10-CM | POA: Insufficient documentation

## 2020-09-17 DIAGNOSIS — K2211 Ulcer of esophagus with bleeding: Secondary | ICD-10-CM

## 2020-09-17 DIAGNOSIS — Z7982 Long term (current) use of aspirin: Secondary | ICD-10-CM | POA: Insufficient documentation

## 2020-09-17 DIAGNOSIS — Z7984 Long term (current) use of oral hypoglycemic drugs: Secondary | ICD-10-CM | POA: Diagnosis not present

## 2020-09-17 DIAGNOSIS — Z8711 Personal history of peptic ulcer disease: Secondary | ICD-10-CM | POA: Insufficient documentation

## 2020-09-17 HISTORY — PX: ESOPHAGOGASTRODUODENOSCOPY (EGD) WITH PROPOFOL: SHX5813

## 2020-09-17 LAB — GLUCOSE, CAPILLARY: Glucose-Capillary: 102 mg/dL — ABNORMAL HIGH (ref 70–99)

## 2020-09-17 SURGERY — ESOPHAGOGASTRODUODENOSCOPY (EGD) WITH PROPOFOL
Anesthesia: General

## 2020-09-17 MED ORDER — LIDOCAINE VISCOUS HCL 2 % MT SOLN
OROMUCOSAL | Status: AC
  Administered 2020-09-17: 15 mL via OROMUCOSAL
  Filled 2020-09-17: qty 15

## 2020-09-17 MED ORDER — LIDOCAINE HCL (CARDIAC) PF 100 MG/5ML IV SOSY
PREFILLED_SYRINGE | INTRAVENOUS | Status: DC | PRN
  Administered 2020-09-17: 60 mg via INTRAVENOUS

## 2020-09-17 MED ORDER — GLYCOPYRROLATE 0.2 MG/ML IJ SOLN
INTRAMUSCULAR | Status: AC
  Administered 2020-09-17: 0.2 mg via INTRAVENOUS
  Filled 2020-09-17: qty 1

## 2020-09-17 MED ORDER — LACTATED RINGERS IV SOLN: INTRAVENOUS | Status: DC

## 2020-09-17 MED ORDER — PROPOFOL 10 MG/ML IV BOLUS
INTRAVENOUS | Status: DC | PRN
  Administered 2020-09-17: 40 mg via INTRAVENOUS
  Administered 2020-09-17: 100 mg via INTRAVENOUS

## 2020-09-17 MED ORDER — GLYCOPYRROLATE 0.2 MG/ML IJ SOLN: 0.2000 mg | Freq: Once | INTRAMUSCULAR | Status: AC

## 2020-09-17 MED ORDER — LIDOCAINE VISCOUS HCL 2 % MT SOLN: 15.0000 mL | Freq: Once | OROMUCOSAL | Status: AC

## 2020-09-17 NOTE — Interval H&P Note (Signed)
History and Physical Interval Note:  09/17/2020 10:20 AM  Bryan Atkinson  has presented today for surgery, with the diagnosis of gastric ulcer, IDA.  The various methods of treatment have been discussed with the patient and family. After consideration of risks, benefits and other options for treatment, the patient has consented to  Procedure(s) with comments: ESOPHAGOGASTRODUODENOSCOPY (EGD) WITH PROPOFOL (N/A) - 10:00am as a surgical intervention.  The patient's history has been reviewed, patient examined, no change in status, stable for surgery.  I have reviewed the patient's chart and labs.  Questions were answered to the patient's satisfaction.     Bryan Atkinson  No change;    no dysphagia;  surveillance EGD per plan.  The risks, benefits, limitations, alternatives and imponderables have been reviewed with the patient. Potential for esophageal dilation, biopsy, etc. have also been reviewed.  Questions have been answered. All parties agreeable.

## 2020-09-17 NOTE — Op Note (Signed)
[facility] Patient Name: Bryan [facility] Procedure Date: 09/17/2020 10:15 AM MRN: [id number] Date of Birth: 12-16-1955 Attending MD: Norvel Richards , MD CSN: [id number] Age: 64 Admit Type: Outpatient Procedure:                Upper GI endoscopy Indications:              Surveillance procedure, Bleeding esophageal ulcer Providers:                Norvel Richards, MD, Otis Peak B. Sharon Seller, RN,                            Raphael Gibney, Technician Referring MD:              Medicines:                Propofol per Anesthesia Complications:            No immediate complications. Estimated Blood Loss:     Estimated blood loss: none. Procedure:                Pre-Anesthesia Assessment:                           - Prior to the procedure, a History and Physical                            was performed, and patient medications and                            allergies were reviewed. The patient's tolerance of                            previous anesthesia was also reviewed. The risks                            and benefits of the procedure and the sedation                            options and risks were discussed with the patient.                            All questions were answered, and informed consent                            was obtained. Prior Anticoagulants: The patient has                            taken no previous anticoagulant or antiplatelet                            agents. ASA Grade Assessment: II - A patient with                            mild systemic disease. After reviewing the risks  and benefits, the patient was deemed in                            satisfactory condition to undergo the procedure.                           After obtaining informed consent, the endoscope was                            passed under direct vision. Throughout the                            procedure, the patient's blood pressure, pulse, and                             oxygen saturations were monitored continuously. The                            [id number]-[id number] ([id number]) scope was introduced through the                            mouth, and advanced to the second part of duodenum. Scope In: 10:27:11 AM Scope Out: 10:30:59 AM Total Procedure Duration: 0 hours 3 minutes 48 seconds  Findings:      The examined esophagus was normal.      Antral scar; ulcers have healed Impression:               - Normal esophagus. Gastric scar; ulcer healed                           - No specimens collected. Moderate Sedation:      Moderate (conscious) sedation was personally administered by an       anesthesia professional. The following parameters were monitored: oxygen       saturation, heart rate, blood pressure, respiratory rate, EKG, adequacy       of pulmonary ventilation, and response to care. Recommendation:           - Patient has a contact number available for                            emergencies. The signs and symptoms of potential                            delayed complications were discussed with the                            patient. Return to normal activities tomorrow.                            Written discharge instructions were provided to the                            patient.                           -  Advance diet as tolerated. no nsaids. OV 3 months Procedure Code(s):        --- Professional ---                           [phone], Esophagogastroduodenoscopy, flexible,                            transoral; diagnostic, including collection of                            specimen(s) by brushing or washing, when performed                            (separate procedure) Diagnosis Code(s):        --- Professional ---                           K22.11, Ulcer of esophagus with bleeding CPT copyright 2019 American Medical Association. All rights reserved. The codes documented in this report are preliminary and upon coder review may  be revised  to meet current compliance requirements. [name], MD Norvel Richards, MD 09/17/2020 10:39:38 AM This report has been signed electronically. Number of Addenda: 0

## 2020-09-17 NOTE — Transfer of Care (Signed)
Immediate Anesthesia Transfer of Care Note  Patient: Bryan [facility]kinson  Procedure(s) Performed: ESOPHAGOGASTRODUODENOSCOPY (EGD) WITH PROPOFOL (N/A )  Patient Location: PACU  Anesthesia Type:MAC  Level of Consciousness: awake, alert  and oriented  Airway & Oxygen Therapy: Patient Spontanous Breathing  Post-op Assessment: Report given to RN and Post -op Vital signs reviewed and stable  Post vital signs: Reviewed and stable  Last Vitals:  Vitals Value Taken Time  BP 127/85 09/17/20 1035  Temp 36.8 C 09/17/20 1035  Pulse 74 09/17/20 1035  Resp 21 09/17/20 1035  SpO2 94 % 09/17/20 1035    Last Pain:  Vitals:   09/17/20 1035  TempSrc: Axillary  PainSc: 0-No pain      Patients Stated Pain Goal: 7 (53/01/04 0459)  Complications: No complications documented.

## 2020-09-17 NOTE — Discharge Instructions (Signed)
EGD Discharge instructions Please read the instructions outlined below and refer to this sheet in the next few weeks. These discharge instructions provide you with general information on caring for yourself after you leave the hospital. Your doctor may also give you specific instructions. While your treatment has been planned according to the most current medical practices available, unavoidable complications occasionally occur. If you have any problems or questions after discharge, please call your doctor. ACTIVITY  You may resume your regular activity but move at a slower pace for the next 24 hours.   Take frequent rest periods for the next 24 hours.   Walking will help expel (get rid of) the air and reduce the bloated feeling in your abdomen.   No driving for 24 hours (because of the anesthesia (medicine) used during the test).   You may shower.   Do not sign any important legal documents or operate any machinery for 24 hours (because of the anesthesia used during the test).  NUTRITION  Drink plenty of fluids.   You may resume your normal diet.   Begin with a light meal and progress to your normal diet.   Avoid alcoholic beverages for 24 hours or as instructed by your caregiver.  MEDICATIONS  You may resume your normal medications unless your caregiver tells you otherwise.  WHAT YOU CAN EXPECT TODAY  You may experience abdominal discomfort such as a feeling of fullness or gas pains.  FOLLOW-UP  Your doctor will discuss the results of your test with you.  SEEK IMMEDIATE MEDICAL ATTENTION IF ANY OF THE FOLLOWING OCCUR:  Excessive nausea (feeling sick to your stomach) and/or vomiting.   Severe abdominal pain and distention (swelling).   Trouble swallowing.   Temperature over 101 F (37.8 C).   Rectal bleeding or vomiting of blood.    Office visit in 3  months  Discussed with [place] After These instructions provide  you with information about caring for yourself after your procedure. Your health care provider may also give you more specific instructions. Your treatment has been planned according to current medical practices, but problems sometimes occur. Call your health care provider if you have any problems or questions after your procedure. What can I expect after the procedure? After your procedure, you may:  Feel sleepy for several hours.  Feel clumsy and have poor balance for several hours.  Feel forgetful about what happened after the procedure.  Have poor judgment for several hours.  Feel nauseous or vomit.  Have a sore throat if you had a breathing tube during the procedure. Follow these instructions at home: For at least 24 hours after the procedure:      Have a responsible adult stay with you. It is important to have someone help care for you until you are awake and alert.  Rest as needed.  Do not: ? Participate in activities in which you could fall or become injured. ? Drive. ? Use heavy machinery. ? Drink alcohol. ? Take sleeping pills or medicines that cause drowsiness. ? Make important decisions or sign legal documents. ? Take care of children on your own. Eating and drinking  Follow the diet that is recommended by your health care provider.  If you vomit, drink water, juice, or soup when you can drink without vomiting.  Make sure you have little or no nausea before eating solid foods. General instructions  Take over-the-counter and prescription medicines only as told by  your health care provider.  If you have sleep apnea, surgery and certain medicines can increase your risk for breathing problems. Follow instructions from your health care provider about wearing your sleep device: ? Anytime you are sleeping, including during daytime naps. ? While taking prescription pain medicines, sleeping medicines, or medicines that make you drowsy.  If you smoke, do not smoke  without supervision.  Keep all follow-up visits as told by your health care provider. This is important. Contact a health care provider if:  You keep feeling nauseous or you keep vomiting.  You feel light-headed.  You develop a rash.  You have a fever. Get help right away if:  You have trouble breathing. Summary  For several hours after your procedure, you may feel sleepy and have poor judgment.  Have a responsible adult stay with you for at least 24 hours or until you are awake and alert. This information is not intended to replace advice given to you by your health care provider. Make sure you discuss any questions you have with your health care provider. Document Revised: 01/01/2018 Document Reviewed: 01/24/2016 Elsevier Patient Education  [place].

## 2020-09-17 NOTE — Anesthesia Postprocedure Evaluation (Signed)
Anesthesia Post Note  Patient: Bryan Atkinson  Procedure(s) Performed: ESOPHAGOGASTRODUODENOSCOPY (EGD) WITH PROPOFOL (N/A )  Patient location during evaluation: Phase II Anesthesia Type: General Level of consciousness: awake, oriented and awake and alert Pain management: pain level controlled Vital Signs Assessment: post-procedure vital signs reviewed and stable Respiratory status: respiratory function stable, spontaneous breathing and nonlabored ventilation Cardiovascular status: blood pressure returned to baseline and stable Postop Assessment: no apparent nausea or vomiting and adequate PO intake Anesthetic complications: no   No complications documented.   Last Vitals:  Vitals:   09/17/20 0854 09/17/20 1035  BP: (!) 175/96 127/85  Pulse:  74  Resp: 16 (!) 21  Temp: 36.8 C 36.8 C  SpO2: 100% 94%    Last Pain:  Vitals:   09/17/20 1035  TempSrc: Axillary  PainSc: 0-No pain                 Karna Dupes

## 2020-09-17 NOTE — Anesthesia Preprocedure Evaluation (Signed)
Anesthesia Evaluation  Patient identified by MRN, date of birth, ID band Patient awake    Reviewed: Allergy & Precautions, H&P , NPO status , Patient's Chart, lab work & pertinent test results, reviewed documented beta blocker date and time   Airway Mallampati: II  TM Distance: >3 FB Neck ROM: full    Dental no notable dental hx.    Pulmonary neg pulmonary ROS,    Pulmonary exam normal breath sounds clear to auscultation       Cardiovascular Exercise Tolerance: Good hypertension, negative cardio ROS   Rhythm:regular Rate:Normal     Neuro/Psych negative neurological ROS  negative psych ROS   GI/Hepatic negative GI ROS, Neg liver ROS,   Endo/Other  negative endocrine ROSdiabetes  Renal/GU negative Renal ROS  negative genitourinary   Musculoskeletal negative musculoskeletal ROS (+)   Abdominal   Peds negative pediatric ROS (+)  Hematology  (+) Blood dyscrasia, anemia ,   Anesthesia Other Findings   Reproductive/Obstetrics negative OB ROS                             Anesthesia Physical  Anesthesia Plan  ASA: II  Anesthesia Plan: General   Post-op Pain Management:    Induction:   PONV Risk Score and Plan: Propofol infusion  Airway Management Planned:   Additional Equipment:   Intra-op Plan:   Post-operative Plan:   Informed Consent: I have reviewed the patients History and Physical, chart, labs and discussed the procedure including the risks, benefits and alternatives for the proposed anesthesia with the patient or authorized representative who has indicated his/her understanding and acceptance.     Dental Advisory Given  Plan Discussed with: CRNA  Anesthesia Plan Comments:         Anesthesia Quick Evaluation

## 2020-09-23 ENCOUNTER — Encounter (HOSPITAL_COMMUNITY): Payer: Self-pay | Admitting: Internal Medicine

## 2020-09-23 ENCOUNTER — Other Ambulatory Visit: Payer: Self-pay

## 2020-09-23 ENCOUNTER — Other Ambulatory Visit (HOSPITAL_COMMUNITY)
Admission: RE | Admit: 2020-09-23 | Discharge: 2020-09-23 | Disposition: A | Discharge status: Home / Self Care | Payer: 59 | Source: Ambulatory Visit | Attending: Nephrology | Admitting: Nephrology

## 2020-09-23 DIAGNOSIS — R809 Proteinuria, unspecified: Secondary | ICD-10-CM | POA: Diagnosis present

## 2020-09-23 DIAGNOSIS — D631 Anemia in chronic kidney disease: Secondary | ICD-10-CM | POA: Insufficient documentation

## 2020-09-23 DIAGNOSIS — E1129 Type 2 diabetes mellitus with other diabetic kidney complication: Secondary | ICD-10-CM | POA: Insufficient documentation

## 2020-09-23 DIAGNOSIS — E1122 Type 2 diabetes mellitus with diabetic chronic kidney disease: Secondary | ICD-10-CM | POA: Diagnosis present

## 2020-09-23 DIAGNOSIS — N189 Chronic kidney disease, unspecified: Secondary | ICD-10-CM | POA: Diagnosis present

## 2020-09-23 DIAGNOSIS — D508 Other iron deficiency anemias: Secondary | ICD-10-CM | POA: Insufficient documentation

## 2020-09-23 DIAGNOSIS — N170 Acute kidney failure with tubular necrosis: Secondary | ICD-10-CM | POA: Insufficient documentation

## 2020-09-23 DIAGNOSIS — E871 Hypo-osmolality and hyponatremia: Secondary | ICD-10-CM | POA: Diagnosis present

## 2020-09-23 LAB — CBC
HCT: 37.8 % — ABNORMAL LOW (ref 39.0–52.0)
Hemoglobin: 12.1 g/dL — ABNORMAL LOW (ref 13.0–17.0)
MCH: 29.4 pg (ref 26.0–34.0)
MCHC: 32 g/dL (ref 30.0–36.0)
MCV: 92 fL (ref 80.0–100.0)
Platelets: 303 10*3/uL (ref 150–400)
RBC: 4.11 MIL/uL — ABNORMAL LOW (ref 4.22–5.81)
RDW: 14.6 % (ref 11.5–15.5)
WBC: 9.7 10*3/uL (ref 4.0–10.5)
nRBC: 0 % (ref 0.0–0.2)

## 2020-09-23 LAB — RENAL FUNCTION PANEL
Albumin: 3.9 g/dL (ref 3.5–5.0)
Anion gap: 10 (ref 5–15)
BUN: 30 mg/dL — ABNORMAL HIGH (ref 8–23)
CO2: 22 mmol/L (ref 22–32)
Calcium: 9.4 mg/dL (ref 8.9–10.3)
Chloride: 106 mmol/L (ref 98–111)
Creatinine, Ser: 1.63 mg/dL — ABNORMAL HIGH (ref 0.61–1.24)
GFR, Estimated: 47 mL/min — ABNORMAL LOW (ref >60)
Glucose, Bld: 67 mg/dL — ABNORMAL LOW (ref 70–99)
Phosphorus: 2.9 mg/dL (ref 2.5–4.6)
Potassium: 3.1 mmol/L — ABNORMAL LOW (ref 3.5–5.1)
Sodium: 138 mmol/L (ref 135–145)

## 2020-09-23 LAB — PROTEIN / CREATININE RATIO, URINE
Creatinine, Urine: 58.78 mg/dL
Protein Creatinine Ratio: 1.92 mg/mg{Cre} — ABNORMAL HIGH (ref 0.00–0.15)
Total Protein, Urine: 113 mg/dL

## 2020-09-24 LAB — PTH, INTACT AND CALCIUM
Calcium, Total (PTH): 9.5 mg/dL (ref 8.6–10.2)
PTH: 21 pg/mL (ref 15–65)

## 2020-10-26 ENCOUNTER — Inpatient Hospital Stay (HOSPITAL_COMMUNITY): Payer: 59 | Attending: Hematology

## 2020-10-26 ENCOUNTER — Other Ambulatory Visit: Payer: Self-pay

## 2020-10-26 DIAGNOSIS — I129 Hypertensive chronic kidney disease with stage 1 through stage 4 chronic kidney disease, or unspecified chronic kidney disease: Secondary | ICD-10-CM | POA: Insufficient documentation

## 2020-10-26 DIAGNOSIS — D72829 Elevated white blood cell count, unspecified: Secondary | ICD-10-CM | POA: Insufficient documentation

## 2020-10-26 DIAGNOSIS — D509 Iron deficiency anemia, unspecified: Secondary | ICD-10-CM

## 2020-10-26 DIAGNOSIS — D631 Anemia in chronic kidney disease: Secondary | ICD-10-CM | POA: Diagnosis present

## 2020-10-26 DIAGNOSIS — N189 Chronic kidney disease, unspecified: Secondary | ICD-10-CM | POA: Insufficient documentation

## 2020-10-26 LAB — IRON AND TIBC
Iron: 46 ug/dL (ref 45–182)
Saturation Ratios: 15 % — ABNORMAL LOW (ref 17.9–39.5)
TIBC: 309 ug/dL (ref 250–450)
UIBC: 263 ug/dL

## 2020-10-26 LAB — CBC WITH DIFFERENTIAL/PLATELET
Abs Immature Granulocytes: 0.04 10*3/uL (ref 0.00–0.07)
Basophils Absolute: 0 10*3/uL (ref 0.0–0.1)
Basophils Relative: 0 %
Eosinophils Absolute: 0.1 10*3/uL (ref 0.0–0.5)
Eosinophils Relative: 2 %
HCT: 35.8 % — ABNORMAL LOW (ref 39.0–52.0)
Hemoglobin: 11.8 g/dL — ABNORMAL LOW (ref 13.0–17.0)
Immature Granulocytes: 0 %
Lymphocytes Relative: 22 %
Lymphs Abs: 2 10*3/uL (ref 0.7–4.0)
MCH: 30.8 pg (ref 26.0–34.0)
MCHC: 33 g/dL (ref 30.0–36.0)
MCV: 93.5 fL (ref 80.0–100.0)
Monocytes Absolute: 0.9 10*3/uL (ref 0.1–1.0)
Monocytes Relative: 10 %
Neutro Abs: 5.9 10*3/uL (ref 1.7–7.7)
Neutrophils Relative %: 66 %
Platelets: 289 10*3/uL (ref 150–400)
RBC: 3.83 MIL/uL — ABNORMAL LOW (ref 4.22–5.81)
RDW: 14 % (ref 11.5–15.5)
WBC: 8.9 10*3/uL (ref 4.0–10.5)
nRBC: 0 % (ref 0.0–0.2)

## 2020-10-26 LAB — FERRITIN: Ferritin: 108 ng/mL (ref 24–336)

## 2020-11-02 ENCOUNTER — Ambulatory Visit (HOSPITAL_COMMUNITY): Payer: 59 | Admitting: Hematology

## 2020-11-11 ENCOUNTER — Other Ambulatory Visit: Payer: Self-pay

## 2020-11-11 ENCOUNTER — Inpatient Hospital Stay (HOSPITAL_BASED_OUTPATIENT_CLINIC_OR_DEPARTMENT_OTHER): Payer: 59 | Admitting: Hematology

## 2020-11-11 VITALS — BP 186/102 | HR 75 | Temp 97.2°F | Resp 20 | Wt 222.5 lb

## 2020-11-11 DIAGNOSIS — N189 Chronic kidney disease, unspecified: Secondary | ICD-10-CM | POA: Diagnosis not present

## 2020-11-11 DIAGNOSIS — D509 Iron deficiency anemia, unspecified: Secondary | ICD-10-CM

## 2020-11-11 NOTE — Progress Notes (Signed)
Bryan Atkinson, [place] [address]   CLINIC:  Medical Oncology/Hematology  PCP:  Bryan Beals, NP [facility] / [place] [facility] [id number]  [phone]  REASON FOR VISIT:  Follow-up for IDA  PRIOR THERAPY: None  CURRENT THERAPY: Intermittent Feraheme last on 06/12/2020; iron tablets daily  INTERVAL HISTORY:  [name], a 65 y.o. male, returns for routine follow-up for his IDA. [facility] Atkinson was last seen on 07/21/2020.  Today he reports feeling well. He felt better after getting the last Feraheme infusion. He was started on daily iron tablets by his PCP and is tolerating it well; he drinks prune juice as needed when he feels constipated. He denies having nosebleeds, melena, hematochezia or hematuria. He stopped taking Norvasc and his leg swelling resolved; he is now on lisinopril and diltiazem.   REVIEW OF SYSTEMS:  Review of Systems  Constitutional: Positive for fatigue (75%). Negative for appetite change.  HENT:   Negative for nosebleeds.   Cardiovascular: Negative for leg swelling.  Gastrointestinal: Negative for blood in stool and constipation (on prune juice).  Genitourinary: Negative for hematuria.   All other systems reviewed and are negative.   PAST MEDICAL/SURGICAL HISTORY:  Past Medical History:  Diagnosis Date  . Arthritis   . Diabetes mellitus without complication ([facility])   . Gout   . History of kidney stones   . Hyperlipidemia   . Hypertension    Past Surgical History:  Procedure Laterality Date  . BIOPSY  05/25/2020   Procedure: BIOPSY;  Surgeon: Bryan Dolin, MD;  Location: [facility];  Service: Endoscopy;;  . CATARACT EXTRACTION W/PHACO Right 05/13/2019   Procedure: CATARACT EXTRACTION PHACO AND INTRAOCULAR LENS PLACEMENT ([facility]);  Surgeon: Bryan Goldmann, MD;  Location: [facility];  Service: Ophthalmology;  Laterality: Right;  CDE: 10.31  . CATARACT EXTRACTION W/PHACO Left 05/27/2019   Procedure: CATARACT  EXTRACTION PHACO AND INTRAOCULAR LENS PLACEMENT ([facility]);  Surgeon: Bryan Goldmann, MD;  Location: [facility];  Service: Ophthalmology;  Laterality: Left;  CDE: 6.49  . COLONOSCOPY     times two 2002 ,2007  . COLONOSCOPY N/A 10/07/2013   Procedure: COLONOSCOPY;  Surgeon: Bryan Dolin, MD;  Location: [facility];  Service: Endoscopy;  Laterality: N/A;  8:30 AM  . COLONOSCOPY N/A 10/05/2018   Dr. Gala [facility]kinson: Diverticulosis.  Next colonoscopy 5 years due to first-degree relative with colon cancer  . ESOPHAGOGASTRODUODENOSCOPY (EGD) WITH PROPOFOL N/A 05/25/2020   [facility]: 2 gastric ulcers.  No H. pylori.  Likely NSAID related.  Repeat EGD in 3 months  . ESOPHAGOGASTRODUODENOSCOPY (EGD) WITH PROPOFOL N/A 09/17/2020   Procedure: ESOPHAGOGASTRODUODENOSCOPY (EGD) WITH PROPOFOL;  Surgeon: Bryan Dolin, MD;  Location: [facility];  Service: Endoscopy;  Laterality: N/A;  10:00am  . JOINT REPLACEMENT    . KNEE ARTHROPLASTY Left 06/27/2019   Procedure: COMPUTER ASSISTED TOTAL KNEE ARTHROPLASTY;  Surgeon: Bryan Can, MD;  Location: [facility];  Service: Orthopedics;  Laterality: Left;  . KNEE ARTHROPLASTY Right 08/15/2019   Procedure: COMPUTER ASSISTED TOTAL KNEE ARTHROPLASTY;  Surgeon: Bryan Can, MD;  Location: [facility];  Service: Orthopedics;  Laterality: Right;  . LEG SURGERY Right early 58's    SOCIAL HISTORY:  Social History   Socioeconomic History  . Marital status: Married    Spouse name: Not on file  . Number of children: 2  . Years of education: Not on file  . Highest education level: Not on file  Occupational History  .  Occupation: Bryan [facility]kinson: runs a Higher education careers adviser as well, [employer] in [place] Use  . Smoking status: Never Smoker  . Smokeless tobacco: Never Used  Vaping Use  . Vaping Use: Never used  Substance and Sexual Activity  . Alcohol use: Yes    Comment: occasionally  . Drug use: No  . Sexual activity: Yes  Other Topics Concern  . Not on file  Social  History Narrative   Daughter is 41, Son is 22.  3 grandchildren. Married for 34 years   Social Determinants of Radio broadcast assistant Strain: Not on file  Food Insecurity: Not on file  Transportation Needs: Not on file  Physical Activity: Not on file  Stress: Not on file  Social Connections: Not on file  Intimate Partner Violence: Not on file    FAMILY HISTORY:  Family History  Problem Relation Age of Onset  . Colon cancer Mother        She died from her malignancy, 53  . Lung cancer Father        Died from lung cancer, age 60  . Emphysema Brother        Died at 80 from lung disease  . COPD Brother   . Dementia Sister   . Healthy Son   . Healthy Daughter     CURRENT MEDICATIONS:  Current Outpatient Medications  Medication Sig Dispense Refill  . acetaminophen (TYLENOL) 500 MG tablet Take 500 mg by mouth every 6 (six) hours as needed for mild pain.    Marland Kitchen allopurinol (ZYLOPRIM) 300 MG tablet Take 300 mg by mouth daily.    Marland Kitchen Apple Cider Vinegar 500 MG TABS Take 500 mg by mouth daily.     Marland Kitchen aspirin EC 81 MG tablet Take 81 mg by mouth daily. Swallow whole.    Marland Kitchen atenolol (TENORMIN) 50 MG tablet Take 50 mg by mouth 2 (two) times daily.     Marland Kitchen Co-Enzyme Q10 100 MG CAPS Take 100 mg by mouth daily.     . Cranberry 500 MG TABS Take 500 mg by mouth daily.    Marland Kitchen diltiazem (CARDIZEM CD) 240 MG 24 hr capsule Take 240 mg by mouth daily.    . fenofibrate (TRICOR) 145 MG tablet Take 145 mg by mouth daily.    . ferrous sulfate 325 (65 FE) MG tablet Take 325 mg by mouth daily with breakfast.    . Flaxseed, Linseed, (FLAXSEED OIL) 1000 MG CAPS Take 1,000 mg by mouth daily.    Marland Kitchen glipiZIDE (GLUCOTROL XL) 10 MG 24 hr tablet Take 10 mg by mouth daily as needed (If blood sugar is > 130).     Marland Kitchen glucose blood test strip 1 each by Other route as needed for other. Use as instructed    . Krill Oil 500 MG CAPS Take 500 mg by mouth 2 (two) times a day.    . lisinopril (ZESTRIL) 30 MG tablet Take by  mouth.    . methocarbamol (ROBAXIN) 500 MG tablet Take 500 mg by mouth every 8 (eight) hours as needed for muscle spasms.     . Multiple Vitamin (MULTIVITAMIN) tablet Take 1 tablet by mouth daily.    . niacin (NIASPAN) 1000 MG CR tablet Take 1,000 mg by mouth at bedtime.    . pantoprazole (PROTONIX) 40 MG tablet TAKE 1 TABLET BY MOUTH EVERY DAY TWICE A DAY (Patient taking differently: Take 40 mg by mouth 2 (two) times daily before  a meal.) 60 tablet 5  . pravastatin (PRAVACHOL) 40 MG tablet Take 40 mg by mouth at bedtime.     . traZODone (DESYREL) 50 MG tablet Take 50 mg by mouth at bedtime.     . triamcinolone cream (KENALOG) 0.1 % Apply 1 application topically 2 (two) times daily as needed (rash).     . Turmeric 500 MG CAPS Take 500 mg by mouth daily.     No current facility-administered medications for this visit.    ALLERGIES:  No Known Allergies  PHYSICAL EXAM:  Performance status (ECOG): 1 - Symptomatic but completely ambulatory  Vitals:   11/11/20 0959  BP: (!) 186/102  Pulse: 75  Resp: 20  Temp: (!) 97.2 F (36.2 C)  SpO2: 99%   Wt Readings from Last 3 Encounters:  11/11/20 222 lb 8 oz (100.9 kg)  09/17/20 200 lb (90.7 kg)  08/25/20 204 lb 6.4 oz (92.7 kg)   Physical Exam Vitals reviewed.  Constitutional:      Appearance: Normal appearance. He is obese.  Cardiovascular:     Rate and Rhythm: Normal rate and regular rhythm.     Pulses: Normal pulses.     Heart sounds: Normal heart sounds.  Pulmonary:     Effort: Pulmonary effort is normal.     Breath sounds: Normal breath sounds.  Musculoskeletal:     Right lower leg: No edema.     Left lower leg: No edema.  Neurological:     General: No focal deficit present.     Mental Status: He is alert and oriented to person, place, and time.  Psychiatric:        Mood and Affect: Mood normal.        Behavior: Behavior normal.     LABORATORY DATA:  I have reviewed the labs as listed.  CBC Latest Ref Rng & Units  10/26/2020 09/23/2020 07/14/2020  WBC 4.0 - 10.5 K/uL 8.9 9.7 10.5  Hemoglobin 13.0 - 17.0 g/dL 11.8(L) 12.1(L) 9.7(L)  Hematocrit 39.0 - 52.0 % 35.8(L) 37.8(L) 31.4(L)  Platelets 150 - 400 K/uL 289 303 433(H)   CMP Latest Ref Rng & Units 09/23/2020 09/23/2020 09/16/2020  Glucose 70 - 99 mg/dL 67(L) - 107(H)  BUN 8 - 23 mg/dL 30(H) - 32(H)  Creatinine 0.61 - 1.24 mg/dL 1.63(H) - 1.65(H)  Sodium 135 - 145 mmol/L 138 - 139  Potassium 3.5 - 5.1 mmol/L 3.1(L) - 4.1  Chloride 98 - 111 mmol/L 106 - 107  CO2 22 - 32 mmol/L 22 - 24  Calcium 8.6 - 10.2 mg/dL 9.4 9.5 9.2  Total Protein 6.5 - 8.1 g/dL - - -  Total Bilirubin 0.3 - 1.2 mg/dL - - -  Alkaline Phos 38 - 126 U/L - - -  AST 15 - 41 U/L - - -  ALT 0 - 44 U/L - - -      Component Value Date/Time   RBC 3.83 (L) 10/26/2020 1542   MCV 93.5 10/26/2020 1542   MCH 30.8 10/26/2020 1542   MCHC 33.0 10/26/2020 1542   RDW 14.0 10/26/2020 1542   LYMPHSABS 2.0 10/26/2020 1542   MONOABS 0.9 10/26/2020 1542   EOSABS 0.1 10/26/2020 1542   BASOSABS 0.0 10/26/2020 1542   Lab Results  Component Value Date   TIBC 309 10/26/2020   TIBC 308 07/14/2020   TIBC 328 05/21/2020   FERRITIN 108 10/26/2020   FERRITIN 289 07/14/2020   FERRITIN 127 05/21/2020   IRONPCTSAT 15 (L) 10/26/2020  IRONPCTSAT 7 (L) 07/14/2020   IRONPCTSAT 7 (L) 05/21/2020    DIAGNOSTIC IMAGING:  I have independently reviewed the scans and discussed with the patient. No results found.   ASSESSMENT:  1. Normocytic anemia: -CBC on 05/21/2020 shows hemoglobin 7.5 with MCV of 88.7. White count was elevated at 11.2 and platelet count 573. -Ferritin was 127 with percent saturation of 7. -EGD on 05/25/2020 shows normal esophagus, small hiatal hernia, normal duodenum. He had 2 gastric ulcers, the largest measuring 1.75 cm deep benign-appearing prepyloric antral ulcer with no bleeding stigmata. -Biopsy of the ulcer shows nonspecific reactive gastropathy, negative for H. pylori. -He  reports that he was taking Aleve every night for arthritic pains, stopped couple of days prior to EGD. -Labs on 06/01/2020 showed SPEP was negative. B12, methylmalonic acid and copper levels were normal. LDH was normal. Free kappa light chains are 53.6, lambda light chain 62.9 and ratio of 0.85. Immunofixation was negative. -Feraheme on 06/05/2020 and 06/12/2020.  2. Social/family history: -He is a non-smoker. 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 -Father had lung cancer and mother had metastatic colon cancer.   PLAN:  1. Normocytic anemia: -Combination anemia from CKD and gastric ulcer. -Reviewed labs from 10/26/2020.  Ferritin is 108 and percent saturation is 15.  Hemoglobin 11.8.  He is feeling better. -He started taking iron tablet daily couple of months ago.  He is using prune juice as needed for constipation. -I have recommended follow-up in 3 months with repeat CBC and iron panel.  If the oral iron therapy helps, will continue it.  Otherwise we will recommend parenteral iron therapy if his hemoglobin/iron panel deteriorates.  2. Leukocytosis: -He has intermittent leukocytosis since 2016.  He is non-smoker.  JAK2 V617F and BCR/ABL testing was negative.  Today CBC shows normalized white count.  3. CKD: -Continue follow-up with Dr. Theador Hawthorne.  4. Hypertension: -Continue Cardizem, atenolol and lisinopril.  Orders placed this encounter:  No orders of the defined types were placed in this encounter.    Derek Jack, MD [place] [phone]   I, Milinda Antis, am acting as a scribe for Dr. Sanda Linger.  I, Derek Jack MD, have reviewed the above documentation for accuracy and completeness, and I agree with the above.

## 2020-11-11 NOTE — Patient Instructions (Signed)
[place] at [facility] Discharge Instructions  You were seen today by Dr. Delton Coombes. He went over your recent results. Continue taking iron tablets daily. Dr. Delton Coombes will see you back in 3 months for labs and follow up.   Thank you for choosing [place] [facility] to provide your oncology and hematology care.  To afford each patient quality time with our provider, please arrive at least 15 minutes before your scheduled appointment time.   If you have a lab appointment with the [facility] please come in thru the Main Entrance and check in at the main information desk  You need to re-schedule your appointment should you arrive 10 or more minutes late.  We strive to give you quality time with our providers, and arriving late affects you and other patients whose appointments are after yours.  Also, if you no show three or more times for appointments you may be dismissed from the clinic at the providers discretion.     Again, thank you for choosing [facility].  Our hope is that these requests will decrease the amount of time that you wait before being seen by our physicians.       _____________________________________________________________  Should you have questions after your visit to [facility], please contact our office at [phone] between the hours of 8:00 a.m. and 4:30 p.m.  Voicemails left after 4:00 p.m. will not be returned until the following business day.  For prescription refill requests, have your pharmacy contact our office and allow 72 hours.    Cancer Center Support Programs:   > Cancer Support Group  2nd Tuesday of the month 1pm-2pm, Journey Room

## 2020-12-02 ENCOUNTER — Other Ambulatory Visit: Payer: Self-pay | Admitting: Gastroenterology

## 2020-12-16 ENCOUNTER — Encounter: Payer: Self-pay | Admitting: Nurse Practitioner

## 2020-12-16 ENCOUNTER — Other Ambulatory Visit: Payer: Self-pay

## 2020-12-16 ENCOUNTER — Ambulatory Visit (INDEPENDENT_AMBULATORY_CARE_PROVIDER_SITE_OTHER): Payer: 59 | Admitting: Nurse Practitioner

## 2020-12-16 VITALS — BP 170/92 | HR 74 | Temp 97.5°F | Ht 69.0 in | Wt 220.6 lb

## 2020-12-16 DIAGNOSIS — K259 Gastric ulcer, unspecified as acute or chronic, without hemorrhage or perforation: Secondary | ICD-10-CM

## 2020-12-16 DIAGNOSIS — D508 Other iron deficiency anemias: Secondary | ICD-10-CM | POA: Diagnosis not present

## 2020-12-16 DIAGNOSIS — K219 Gastro-esophageal reflux disease without esophagitis: Secondary | ICD-10-CM | POA: Diagnosis not present

## 2020-12-16 NOTE — Progress Notes (Signed)
Referring Provider: Everardo Beals, NP Primary Care Physician:  Everardo Beals, NP Primary GI:  Dr. Gala Romney  Chief Complaint  Patient presents with  . pp f/u    Doing ok    HPI:   Bryan Atkinson is a 65 y.o. male who presents for postprocedural follow-up.  The patient was last seen in our office 08/25/2020 for IDA.  Evaluated previously in 2014 by hematology felt to be anemia of chronic disease.  Colonoscopy at that time was updated in 2019 and therefore only an EGD was completed which showed 2 gastric ulcers and benign biopsies without H. pylori.  Pantoprazole increased to twice a day and recommended follow-up with hematology and GI.  Noted persistent IDA with labs in September demonstrating hemoglobin 9.7, ferritin 289, iron saturation 7%.  Has been seen hematology for iron infusions.  Persistent chronic kidney disease seeing nephrology and overall felt NSAIDs contributing to acute kidney injury, noted improvement since stopping NSAIDs.  Recommended EGD.  EGD was completed 09/17/2020 which found normal esophagus, gastric scar status post healed ulcer with no specimen collection.  Recommended strict NSAID avoidance, follow-up in 3 months.  Most recent labs by hematology on 10/26/2020 finds hemoglobin improved at 11.8, ferritin remains normal, although lower, at 108.  Iron panel normal except for saturation at 15%, which is improved compared to 5 months prior.  Today states doing okay overall. His BP is quite elevated today. States Nephrology is titrating some of his BP medications and he is supposed to see them today. Denies chest pain, dyspnea, dizziness, headache. States in the middle of the day when he checks his BP it's usually 140s/80-90s. Denies any further NSAIDs. If he has a headache or pain he uses Tylenol. Denies GERD, abdominal pain, N/V, hematochezia, melena, fever, chills, unintentional weight loss. Denies URI or flu-like symptoms. Denies loss of sense of taste or smell.  The patient has received COVID-19 vaccination(s). They have had a bosster dose as well. Denies chest pain, dyspnea, dizziness, lightheadedness, syncope, near syncope. Denies any other upper or lower GI symptoms.  Still seeing hematology, on oral iron. On Protonix 40 mg bid, is asking about possible dose reduction.  Past Medical History:  Diagnosis Date  . Arthritis   . Diabetes mellitus without complication ([place])   . Gout   . History of kidney stones   . Hyperlipidemia   . Hypertension     Past Surgical History:  Procedure Laterality Date  . BIOPSY  05/25/2020   Procedure: BIOPSY;  Surgeon: Daneil Dolin, MD;  Location: [facility];  Service: Endoscopy;;  . CATARACT EXTRACTION W/PHACO Right 05/13/2019   Procedure: CATARACT EXTRACTION PHACO AND INTRAOCULAR LENS PLACEMENT ([place]);  Surgeon: Baruch Goldmann, MD;  Location: [facility] ORS;  Service: Ophthalmology;  Laterality: Right;  CDE: 10.31  . CATARACT EXTRACTION W/PHACO Left 05/27/2019   Procedure: CATARACT EXTRACTION PHACO AND INTRAOCULAR LENS PLACEMENT ([facility]);  Surgeon: Baruch Goldmann, MD;  Location: [facility] ORS;  Service: Ophthalmology;  Laterality: Left;  CDE: 6.49  . COLONOSCOPY     times two 2002 ,2007  . COLONOSCOPY N/A 10/07/2013   Procedure: COLONOSCOPY;  Surgeon: Daneil Dolin, MD;  Location: [facility];  Service: Endoscopy;  Laterality: N/A;  8:30 AM  . COLONOSCOPY N/A 10/05/2018   Dr. Gala Romney: Diverticulosis.  Next colonoscopy 5 years due to first-degree relative with colon cancer  . ESOPHAGOGASTRODUODENOSCOPY (EGD) WITH PROPOFOL N/A 05/25/2020   [name]: 2 gastric ulcers.  No H. pylori.  Likely NSAID related.  Repeat EGD in 3 months  . ESOPHAGOGASTRODUODENOSCOPY (EGD) WITH PROPOFOL N/A 09/17/2020   Procedure: ESOPHAGOGASTRODUODENOSCOPY (EGD) WITH PROPOFOL;  Surgeon: Daneil Dolin, MD;  Location: [facility];  Service: Endoscopy;  Laterality: N/A;  10:00am  . JOINT REPLACEMENT    . KNEE ARTHROPLASTY Left 06/27/2019   Procedure:  COMPUTER ASSISTED TOTAL KNEE ARTHROPLASTY;  Surgeon: Rod Can, MD;  Location: [facility];  Service: Orthopedics;  Laterality: Left;  . KNEE ARTHROPLASTY Right 08/15/2019   Procedure: COMPUTER ASSISTED TOTAL KNEE ARTHROPLASTY;  Surgeon: Rod Can, MD;  Location: [facility];  Service: Orthopedics;  Laterality: Right;  . LEG SURGERY Right early 80's    Current Outpatient Medications  Medication Sig Dispense Refill  . acetaminophen (TYLENOL) 500 MG tablet Take 500 mg by mouth every 6 (six) hours as needed for mild pain.    Marland Kitchen allopurinol (ZYLOPRIM) 300 MG tablet Take 300 mg by mouth daily.    Marland Kitchen Apple Cider Vinegar 500 MG TABS Take 500 mg by mouth daily.     Marland Kitchen aspirin EC 81 MG tablet Take 81 mg by mouth daily. Swallow whole.    . carvedilol (COREG) 25 MG tablet Take 25 mg by mouth 2 (two) times daily.    Marland Kitchen Co-Enzyme Q10 100 MG CAPS Take 100 mg by mouth daily.     . Cranberry 500 MG TABS Take 500 mg by mouth daily.    . fenofibrate (TRICOR) 145 MG tablet Take 145 mg by mouth daily.    . ferrous sulfate 325 (65 FE) MG tablet Take 325 mg by mouth daily with breakfast.    . Flaxseed, Linseed, (FLAXSEED OIL) 1000 MG CAPS Take 1,000 mg by mouth daily.    . furosemide (LASIX) 20 MG tablet Take 20 mg by mouth 2 (two) times daily.    Marland Kitchen glipiZIDE (GLUCOTROL XL) 10 MG 24 hr tablet Take 10 mg by mouth daily as needed (If blood sugar is > 130).     Marland Kitchen glucose blood test strip 1 each by Other route as needed for other. Use as instructed    . Krill Oil 500 MG CAPS Take 500 mg by mouth 2 (two) times a day.    . lisinopril (ZESTRIL) 40 MG tablet Take 1 tablet by mouth 2 (two) times daily.    . methocarbamol (ROBAXIN) 500 MG tablet Take 500 mg by mouth every 8 (eight) hours as needed for muscle spasms.     . Multiple Vitamin (MULTIVITAMIN) tablet Take 1 tablet by mouth daily.    . niacin (NIASPAN) 1000 MG CR tablet Take 1,000 mg by mouth at bedtime.    . pantoprazole (PROTONIX) 40 MG tablet Take 1 tablet (40  mg total) by mouth 2 (two) times daily before a meal. 180 tablet 1  . pravastatin (PRAVACHOL) 40 MG tablet Take 40 mg by mouth at bedtime.     . traZODone (DESYREL) 50 MG tablet Take 50 mg by mouth at bedtime.     . triamcinolone cream (KENALOG) 0.1 % Apply 1 application topically 2 (two) times daily as needed (rash).     . Turmeric 500 MG CAPS Take 500 mg by mouth daily.     No current facility-administered medications for this visit.    Allergies as of 12/16/2020  . (No Known Allergies)    Family History  Problem Relation Age of Onset  . Colon cancer Mother        She died from her malignancy, 80  . Lung cancer Father  Died from lung cancer, age 53  . Emphysema Brother        Died at 64 from lung disease  . COPD Brother   . Dementia Sister   . Healthy Son   . Healthy Daughter     Social History   Socioeconomic History  . Marital status: Married    Spouse name: Not on file  . Number of children: 2  . Years of education: Not on file  . Highest education level: Not on file  Occupational History  . 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 Smoking status: Never Smoker  . Smokeless tobacco: Never Used  Vaping Use  . Vaping Use: Never used  Substance and Sexual Activity  . Alcohol use: Yes    Comment: occasionally  . Drug use: No  . Sexual activity: Yes  Other Topics Concern  . Not on file  Social History Narrative   Daughter is 17, Son is 56.  3 grandchildren. Married for 34 years   Social Determinants of Radio broadcast assistant Strain: Not on file  Food Insecurity: Not on file  Transportation Needs: Not on file  Physical Activity: Not on file  Stress: Not on file  Social Connections: Not on file    Subjective: Review of Systems  Constitutional: Negative for chills, fever, malaise/fatigue and weight loss.  HENT: Negative for congestion and sore throat.   Respiratory: Negative for  cough and shortness of breath.   Cardiovascular: Negative for chest pain and palpitations.  Gastrointestinal: Negative for abdominal pain, blood in stool, diarrhea, melena, nausea and vomiting.  Musculoskeletal: Negative for joint pain and myalgias.  Skin: Negative for rash.  Neurological: Negative for dizziness and weakness.  Endo/Heme/Allergies: Does not bruise/bleed easily.  Psychiatric/Behavioral: Negative for depression. The patient is not nervous/anxious.   All other systems reviewed and are negative.    Objective: BP (!) 189/105   Pulse 74   Temp (!) 97.5 F (36.4 C) (Temporal)   Ht '5\' 9"'$  (1.753 m)   Wt 220 lb 9.6 oz (100.1 kg)   BMI 32.58 kg/m  Physical Exam Vitals and nursing note reviewed.  Constitutional:      General: He is not in acute distress.    Appearance: Normal appearance. He is obese. He is not ill-appearing, toxic-appearing or diaphoretic.  HENT:     Head: Normocephalic and atraumatic.     Nose: No congestion or rhinorrhea.  Eyes:     General: No scleral icterus. Cardiovascular:     Rate and Rhythm: Normal rate and regular rhythm.     Heart sounds: Normal heart sounds.  Pulmonary:     Effort: Pulmonary effort is normal.     Breath sounds: Normal breath sounds.  Abdominal:     General: Bowel sounds are normal. There is no distension.     Palpations: Abdomen is soft. There is no hepatomegaly, splenomegaly or mass.     Tenderness: There is no abdominal tenderness. There is no guarding or rebound.     Hernia: No hernia is present.  Musculoskeletal:     Cervical back: Neck supple.  Skin:    General: Skin is warm and dry.     Coloration: Skin is not jaundiced.     Findings: No bruising or rash.  Neurological:     General: No focal deficit present.     Mental Status: He is alert and oriented to  person, place, and time. Mental status is at baseline.  Psychiatric:        Mood and Affect: Mood normal.        Behavior: Behavior normal.        Thought  Content: Thought content normal.      Assessment:  Very pleasant 65 year old male presents for follow-up on GERD, iron deficiency anemia, history of ulcer.  Overall he is doing quite well today.  No red flag/warning signs or symptoms.  GERD with history of gastric ulcer: Symptoms currently doing well on PPI twice daily.  Recent EGD documented healed gastric ulcer.  He is avoiding NSAIDs carefully, only using Tylenol at this time.  He is requesting to possibly reduce his Protonix frequency.  I will have him reduce this to once a day temporarily.  I have explained that he can always return to twice a day dosing if needed.  Call us for any problems  Anemia: He has had a pretty significant GI work-up with colonoscopy and EGD without obvious findings.  His hemoglobin appears to have improved substantially.  His iron studies are also essentially normal or improving.  He is on daily oral iron, has not had iron infusions in some time.  At this point, after discussion, we decided to hold off on capsule endoscopy to establish further trend with his hemoglobin and iron studies.  His next labs are due in April with hematology.  At follow-up if needed we can always proceed with capsule endoscopy for completeness.  Elevated blood pressure: His blood pressure is quite elevated today.  He denies any symptoms.  He states nephrology has been tinkering with his medications and they are working on ongoing management and medication adjustment to better control his blood pressure.  We will recheck his blood pressure today before he leaves and he is agreed to call his nephrologist and tell them his blood pressure today.  ER precautions will be given   Plan: 1. Reduce Protonix to 40 mg once daily 2. Can increase to twice a day again if needed 3. Follow-up in 6 months for follow-up and consideration of capsule endoscopy    Thank you for allowing [place] to participate in the care of Gavin Pound Zirkle  Walden Field, DNP,  AGNP-C Adult & Gerontological Nurse Practitioner [facility]   12/16/2020 8:37 AM   Disclaimer: This note was dictated with voice recognition software. Similar sounding words can inadvertently be transcribed and may not be corrected upon review.

## 2020-12-16 NOTE — Progress Notes (Signed)
Cc'ed to pcp °

## 2020-12-16 NOTE — Patient Instructions (Signed)
Your health issues we discussed today were:   GERD (reflux/heartburn) with a history of stomach ulcer: 1. Glad doing better! 2. As we discussed, you can reduce your Protonix to 40 mg pill once a day 3. You can always increase it back to twice a day if you develop recurrent reflux symptoms 4. Call for any worsening or severe symptoms or if you have any questions about your medication  Anemia: 1. Continue to follow-up with nephrology and hematology 2. As we discussed, we will keep an eye on your blood levels and iron studies over the next 6 months 3. When we see you back in 6 months we can decide if we want to proceed with a capsule endoscopy 4. Call us if you see any obvious bleeding or "pitch black" stools  Elevated blood pressure: 1. Call your nephrologist today to discuss your elevated blood pressure 2. If you develop chest pain, the worst headache of your life, dizziness, passing out, or other concerning symptoms you can proceed to the emergency room  Overall I recommend:  1. Continue other current medications 2. Return for follow-up in 6 months 3. Call us for any questions or concerns   ---------------------------------------------------------------  I am glad you have gotten your COVID-19 vaccination!  Even though you are fully vaccinated you should continue to follow CDC and state/local guidelines.  ---------------------------------------------------------------   At [facility] we value your feedback. You may receive a survey about your visit today. Please share your experience as we strive to create trusting relationships with our patients to provide genuine, compassionate, quality care.  We appreciate your understanding and patience as we review any laboratory studies, imaging, and other diagnostic tests that are ordered as we care for you. Our office policy is 5 business days for review of these results, and any emergent or urgent results are addressed in a  timely manner for your best interest. If you do not hear from our office in 1 week, please contact us.   We also encourage the use of MyChart, which contains your medical information for your review as well. If you are not enrolled in this feature, an access code is on this after visit summary for your convenience. Thank you for allowing [place] to be involved in your care.  It was great to see you today!  I hope you have a great spring!!

## 2021-02-09 ENCOUNTER — Inpatient Hospital Stay (HOSPITAL_COMMUNITY): Payer: 59 | Attending: Hematology

## 2021-02-09 ENCOUNTER — Other Ambulatory Visit: Payer: Self-pay

## 2021-02-09 DIAGNOSIS — D509 Iron deficiency anemia, unspecified: Secondary | ICD-10-CM

## 2021-02-09 DIAGNOSIS — N189 Chronic kidney disease, unspecified: Secondary | ICD-10-CM | POA: Diagnosis present

## 2021-02-09 DIAGNOSIS — D631 Anemia in chronic kidney disease: Secondary | ICD-10-CM | POA: Insufficient documentation

## 2021-02-09 LAB — CBC WITH DIFFERENTIAL/PLATELET
Abs Immature Granulocytes: 0.05 10*3/uL (ref 0.00–0.07)
Basophils Absolute: 0.1 10*3/uL (ref 0.0–0.1)
Basophils Relative: 1 %
Eosinophils Absolute: 0.2 10*3/uL (ref 0.0–0.5)
Eosinophils Relative: 3 %
HCT: 35.8 % — ABNORMAL LOW (ref 39.0–52.0)
Hemoglobin: 11.9 g/dL — ABNORMAL LOW (ref 13.0–17.0)
Immature Granulocytes: 1 %
Lymphocytes Relative: 23 %
Lymphs Abs: 1.9 10*3/uL (ref 0.7–4.0)
MCH: 31.8 pg (ref 26.0–34.0)
MCHC: 33.2 g/dL (ref 30.0–36.0)
MCV: 95.7 fL (ref 80.0–100.0)
Monocytes Absolute: 0.8 10*3/uL (ref 0.1–1.0)
Monocytes Relative: 9 %
Neutro Abs: 5.1 10*3/uL (ref 1.7–7.7)
Neutrophils Relative %: 63 %
Platelets: 315 10*3/uL (ref 150–400)
RBC: 3.74 MIL/uL — ABNORMAL LOW (ref 4.22–5.81)
RDW: 13.2 % (ref 11.5–15.5)
WBC: 8.1 10*3/uL (ref 4.0–10.5)
nRBC: 0 % (ref 0.0–0.2)

## 2021-02-09 LAB — FERRITIN: Ferritin: 163 ng/mL (ref 24–336)

## 2021-02-09 LAB — IRON AND TIBC
Iron: 44 ug/dL — ABNORMAL LOW (ref 45–182)
Saturation Ratios: 12 % — ABNORMAL LOW (ref 17.9–39.5)
TIBC: 353 ug/dL (ref 250–450)
UIBC: 309 ug/dL

## 2021-02-10 ENCOUNTER — Other Ambulatory Visit: Payer: Self-pay

## 2021-02-10 ENCOUNTER — Other Ambulatory Visit (HOSPITAL_COMMUNITY)
Admission: RE | Admit: 2021-02-10 | Discharge: 2021-02-10 | Disposition: A | Discharge status: Home / Self Care | Payer: 59 | Source: Ambulatory Visit | Attending: Nephrology | Admitting: Nephrology

## 2021-02-10 ENCOUNTER — Other Ambulatory Visit: Payer: Self-pay | Admitting: Nephrology

## 2021-02-10 ENCOUNTER — Other Ambulatory Visit (HOSPITAL_COMMUNITY): Payer: Self-pay | Admitting: Nephrology

## 2021-02-10 DIAGNOSIS — I129 Hypertensive chronic kidney disease with stage 1 through stage 4 chronic kidney disease, or unspecified chronic kidney disease: Secondary | ICD-10-CM

## 2021-02-10 DIAGNOSIS — N189 Chronic kidney disease, unspecified: Secondary | ICD-10-CM | POA: Diagnosis not present

## 2021-02-10 DIAGNOSIS — R809 Proteinuria, unspecified: Secondary | ICD-10-CM

## 2021-02-10 DIAGNOSIS — D508 Other iron deficiency anemias: Secondary | ICD-10-CM

## 2021-02-10 DIAGNOSIS — E1122 Type 2 diabetes mellitus with diabetic chronic kidney disease: Secondary | ICD-10-CM

## 2021-02-10 DIAGNOSIS — N170 Acute kidney failure with tubular necrosis: Secondary | ICD-10-CM

## 2021-02-10 LAB — RENAL FUNCTION PANEL
Albumin: 3.8 g/dL (ref 3.5–5.0)
Anion gap: 8 (ref 5–15)
BUN: 61 mg/dL — ABNORMAL HIGH (ref 8–23)
CO2: 22 mmol/L (ref 22–32)
Calcium: 9.5 mg/dL (ref 8.9–10.3)
Chloride: 105 mmol/L (ref 98–111)
Creatinine, Ser: 3.38 mg/dL — ABNORMAL HIGH (ref 0.61–1.24)
GFR, Estimated: 20 mL/min — ABNORMAL LOW (ref >60)
Glucose, Bld: 115 mg/dL — ABNORMAL HIGH (ref 70–99)
Phosphorus: 3.5 mg/dL (ref 2.5–4.6)
Potassium: 4 mmol/L (ref 3.5–5.1)
Sodium: 135 mmol/L (ref 135–145)

## 2021-02-15 ENCOUNTER — Ambulatory Visit (HOSPITAL_COMMUNITY)
Admission: RE | Admit: 2021-02-15 | Discharge: 2021-02-15 | Disposition: A | Discharge status: Home / Self Care | Payer: 59 | Source: Ambulatory Visit | Attending: Nephrology | Admitting: Nephrology

## 2021-02-15 DIAGNOSIS — D631 Anemia in chronic kidney disease: Secondary | ICD-10-CM | POA: Insufficient documentation

## 2021-02-15 DIAGNOSIS — R809 Proteinuria, unspecified: Secondary | ICD-10-CM | POA: Insufficient documentation

## 2021-02-15 DIAGNOSIS — N170 Acute kidney failure with tubular necrosis: Secondary | ICD-10-CM | POA: Insufficient documentation

## 2021-02-15 DIAGNOSIS — D508 Other iron deficiency anemias: Secondary | ICD-10-CM | POA: Insufficient documentation

## 2021-02-15 DIAGNOSIS — I129 Hypertensive chronic kidney disease with stage 1 through stage 4 chronic kidney disease, or unspecified chronic kidney disease: Secondary | ICD-10-CM | POA: Insufficient documentation

## 2021-02-15 DIAGNOSIS — E1122 Type 2 diabetes mellitus with diabetic chronic kidney disease: Secondary | ICD-10-CM | POA: Insufficient documentation

## 2021-02-15 DIAGNOSIS — N189 Chronic kidney disease, unspecified: Secondary | ICD-10-CM | POA: Insufficient documentation

## 2021-02-16 ENCOUNTER — Other Ambulatory Visit (HOSPITAL_COMMUNITY)
Admission: RE | Admit: 2021-02-16 | Discharge: 2021-02-16 | Disposition: A | Discharge status: Home / Self Care | Payer: 59 | Source: Ambulatory Visit | Attending: Nephrology | Admitting: Nephrology

## 2021-02-16 ENCOUNTER — Other Ambulatory Visit: Payer: Self-pay

## 2021-02-16 ENCOUNTER — Inpatient Hospital Stay (HOSPITAL_COMMUNITY): Payer: 59 | Attending: Hematology | Admitting: Hematology

## 2021-02-16 VITALS — HR 110 | Temp 97.1°F | Resp 18 | Wt 231.2 lb

## 2021-02-16 DIAGNOSIS — N189 Chronic kidney disease, unspecified: Secondary | ICD-10-CM | POA: Insufficient documentation

## 2021-02-16 DIAGNOSIS — D631 Anemia in chronic kidney disease: Secondary | ICD-10-CM | POA: Diagnosis present

## 2021-02-16 DIAGNOSIS — D72829 Elevated white blood cell count, unspecified: Secondary | ICD-10-CM | POA: Insufficient documentation

## 2021-02-16 DIAGNOSIS — I129 Hypertensive chronic kidney disease with stage 1 through stage 4 chronic kidney disease, or unspecified chronic kidney disease: Secondary | ICD-10-CM | POA: Diagnosis not present

## 2021-02-16 DIAGNOSIS — D509 Iron deficiency anemia, unspecified: Secondary | ICD-10-CM | POA: Diagnosis not present

## 2021-02-16 LAB — RENAL FUNCTION PANEL
Albumin: 4 g/dL (ref 3.5–5.0)
Anion gap: 8 (ref 5–15)
BUN: 51 mg/dL — ABNORMAL HIGH (ref 8–23)
CO2: 23 mmol/L (ref 22–32)
Calcium: 9.7 mg/dL (ref 8.9–10.3)
Chloride: 106 mmol/L (ref 98–111)
Creatinine, Ser: 2.71 mg/dL — ABNORMAL HIGH (ref 0.61–1.24)
GFR, Estimated: 26 mL/min — ABNORMAL LOW (ref >60)
Glucose, Bld: 82 mg/dL (ref 70–99)
Phosphorus: 2.8 mg/dL (ref 2.5–4.6)
Potassium: 4.1 mmol/L (ref 3.5–5.1)
Sodium: 137 mmol/L (ref 135–145)

## 2021-02-16 NOTE — Progress Notes (Signed)
[place], [place] [address]   CLINIC:  Medical Oncology/Hematology  PCP:  Everardo Beals, NP [place] / [place] [id number]  [phone]  REASON FOR VISIT:  Follow-up for IDA  PRIOR THERAPY: Intermittent Feraheme last on 06/12/2020  CURRENT THERAPY: Oral iron tablets daily  INTERVAL HISTORY:  [name], a 65 y.o. male, returns for routine follow-up for his IDA. Bryan Atkinson was last seen on 11/11/2020.  Today he is accompanied by his wife and he reports feeling well. He is taking 2 iron tablets daily and he drinks prune juice daily for constipation; Dr. Theador Hawthorne recently started him on cranberry juice for his decreased CKD. He takes glipizide QAM. He denies having melena or hematochezia.   REVIEW OF SYSTEMS:  Review of Systems  Constitutional: Positive for fatigue (75%). Negative for appetite change.  Gastrointestinal: Positive for constipation (daily prune juice).  All other systems reviewed and are negative.   PAST MEDICAL/SURGICAL HISTORY:  Past Medical History:  Diagnosis Date  . Arthritis   . Diabetes mellitus without complication ([place])   . Gout   . History of kidney stones   . Hyperlipidemia   . Hypertension    Past Surgical History:  Procedure Laterality Date  . BIOPSY  05/25/2020   Procedure: BIOPSY;  Surgeon: Bryan Dolin, MD;  [facility];  Service: Endoscopy;;  . CATARACT EXTRACTION W/PHACO Right 05/13/2019   Procedure: CATARACT EXTRACTION PHACO AND INTRAOCULAR LENS PLACEMENT (Bryan Atkinson);  Surgeon: Bryan Goldmann, MD;  Location: [facility];  Service: Ophthalmology;  Laterality: Right;  CDE: 10.31  . CATARACT EXTRACTION W/PHACO Left 05/27/2019   Procedure: CATARACT EXTRACTION PHACO AND INTRAOCULAR LENS PLACEMENT ([facility]);  Surgeon: Bryan Goldmann, MD;  Location: [facility];  Service: Ophthalmology;  Laterality: Left;  CDE: 6.49  . COLONOSCOPY     times two 2002 ,2007  . COLONOSCOPY N/A 10/07/2013    Procedure: COLONOSCOPY;  Surgeon: Bryan Dolin, MD;  [facility];  Service: Endoscopy;  Laterality: N/A;  8:30 AM  . COLONOSCOPY N/A 10/05/2018   Dr. Gala Atkinson: Diverticulosis.  Next colonoscopy 5 years due to first-degree relative with colon cancer  . ESOPHAGOGASTRODUODENOSCOPY (EGD) WITH PROPOFOL N/A 05/25/2020   Bryan Atkinson: 2 gastric ulcers.  No H. pylori.  Likely NSAID related.  Repeat EGD in 3 months  . ESOPHAGOGASTRODUODENOSCOPY (EGD) WITH PROPOFOL N/A 09/17/2020   Procedure: ESOPHAGOGASTRODUODENOSCOPY (EGD) WITH PROPOFOL;  Surgeon: Bryan Dolin, MD;  [facility];  Service: Endoscopy;  Laterality: N/A;  10:00am  . JOINT REPLACEMENT    . KNEE ARTHROPLASTY Left 06/27/2019   Procedure: COMPUTER ASSISTED TOTAL KNEE ARTHROPLASTY;  Surgeon: Rod Can, MD;  Location: [facility];  Service: Orthopedics;  Laterality: Left;  . KNEE ARTHROPLASTY Right 08/15/2019   Procedure: COMPUTER ASSISTED TOTAL KNEE ARTHROPLASTY;  Surgeon: Rod Can, MD;  Location: [facility];  Service: Orthopedics;  Laterality: Right;  . LEG SURGERY Right early 107's    SOCIAL HISTORY:  Social History   Socioeconomic History  . Marital status: Married    Spouse name: Not on file  . Number of children: 2  . Years of education: Not on file  . Highest education level: Not on file  Occupational History  . 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 Smoking status: Never Smoker  . Smokeless tobacco: Never Used  Vaping Use  . Vaping  Use: Never used  Substance and Sexual Activity  . Alcohol use: Yes    Comment: occasionally  . Drug use: No  . Sexual activity: Yes  Other Topics Concern  . Not on file  Social History Narrative   Bryan Atkinson is 66, Bryan Atkinson is 83.  3 grandchildren. Married for 34 years   Social Determinants of Radio broadcast assistant Strain: Not on file  Food Insecurity: Not on file  Transportation Needs: Not on file   Physical Activity: Not on file  Stress: Not on file  Social Connections: Not on file  Intimate Partner Violence: Not on file    FAMILY HISTORY:  Family History  Problem Relation Age of Onset  . Colon cancer Mother        She died from her malignancy, 67  . Lung cancer Father        Died from lung cancer, age 34  . Emphysema Brother        Died at 28 from lung disease  . COPD Brother   . Dementia Sister   . Healthy Bryan Atkinson   . Healthy Bryan Atkinson     CURRENT MEDICATIONS:  Current Outpatient Medications  Medication Sig Dispense Refill  . acetaminophen (TYLENOL) 500 MG tablet Take 500 mg by mouth every 6 (six) hours as needed for mild pain.    Marland Kitchen allopurinol (ZYLOPRIM) 300 MG tablet Take 300 mg by mouth daily.    Marland Kitchen Apple Cider Vinegar 500 MG TABS Take 500 mg by mouth daily.     Marland Kitchen aspirin EC 81 MG tablet Take 81 mg by mouth daily. Swallow whole.    . carvedilol (COREG) 25 MG tablet Take 25 mg by mouth 2 (two) times daily.    Marland Kitchen Co-Enzyme Q10 100 MG CAPS Take 100 mg by mouth daily.     . Cranberry 500 MG TABS Take 500 mg by mouth daily.    . fenofibrate (TRICOR) 145 MG tablet Take 145 mg by mouth daily.    . ferrous sulfate 325 (65 FE) MG tablet Take 325 mg by mouth daily with breakfast.    . Flaxseed, Linseed, (FLAXSEED OIL) 1000 MG CAPS Take 1,000 mg by mouth daily.    Marland Kitchen glipiZIDE (GLUCOTROL XL) 10 MG 24 hr tablet Take 10 mg by mouth daily as needed (If blood sugar is > 130).     Marland Kitchen glucose blood test strip 1 each by Other route as needed for other. Use as instructed    . Krill Oil 500 MG CAPS Take 500 mg by mouth 2 (two) times a day.    . Multiple Vitamin (MULTIVITAMIN) tablet Take 1 tablet by mouth daily.    . niacin (NIASPAN) 1000 MG CR tablet Take 1,000 mg by mouth at bedtime.    . pantoprazole (PROTONIX) 40 MG tablet Take 1 tablet (40 mg total) by mouth 2 (two) times daily before a meal. 180 tablet 1  . pravastatin (PRAVACHOL) 40 MG tablet Take 40 mg by mouth at bedtime.     .  traZODone (DESYREL) 50 MG tablet Take 50 mg by mouth at bedtime.     . triamcinolone cream (KENALOG) 0.1 % Apply 1 application topically 2 (two) times daily as needed (rash).     . Turmeric 500 MG CAPS Take 500 mg by mouth daily.    . hydrALAZINE (APRESOLINE) 50 MG tablet Take 50 mg by mouth 3 (three) times daily.     No current facility-administered medications for this visit.  ALLERGIES:  No Known Allergies  PHYSICAL EXAM:  Performance status (ECOG): 1 - Symptomatic but completely ambulatory  Vitals:   02/16/21 1409  Pulse: (!) 110  Resp: 18  Temp: (!) 97.1 F (36.2 C)  SpO2: 97%   Wt Readings from Last 3 Encounters:  02/16/21 231 lb 3.2 oz (104.9 kg)  12/16/20 220 lb 9.6 oz (100.1 kg)  11/11/20 222 lb 8 oz (100.9 kg)   Physical Exam Vitals reviewed.  Constitutional:      Appearance: Normal appearance. He is obese.  Cardiovascular:     Rate and Rhythm: Normal rate and regular rhythm.     Pulses: Normal pulses.     Heart sounds: Normal heart sounds.  Pulmonary:     Effort: Pulmonary effort is normal.     Breath sounds: Normal breath sounds.  Musculoskeletal:     Right lower leg: No edema.     Left lower leg: No edema.  Neurological:     General: No focal deficit present.     Mental Status: He is alert and oriented to person, place, and time.  Psychiatric:        Mood and Affect: Mood normal.        Behavior: Behavior normal.     LABORATORY DATA:  I have reviewed the labs as listed.  CBC Latest Ref Rng & Units 02/09/2021 10/26/2020 09/23/2020  WBC 4.0 - 10.5 K/uL 8.1 8.9 9.7  Hemoglobin 13.0 - 17.0 g/dL 11.9(L) 11.8(L) 12.1(L)  Hematocrit 39.0 - 52.0 % 35.8(L) 35.8(L) 37.8(L)  Platelets 150 - 400 K/uL 315 289 303   CMP Latest Ref Rng & Units 02/10/2021 09/23/2020 09/23/2020  Glucose 70 - 99 mg/dL 115(H) 67(L) -  BUN 8 - 23 mg/dL 61(H) 30(H) -  Creatinine 0.61 - 1.24 mg/dL 3.38(H) 1.63(H) -  Sodium 135 - 145 mmol/L 135 138 -  Potassium 3.5 - 5.1 mmol/L 4.0  3.1(L) -  Chloride 98 - 111 mmol/L 105 106 -  CO2 22 - 32 mmol/L 22 22 -  Calcium 8.9 - 10.3 mg/dL 9.5 9.4 9.5  Total Protein 6.5 - 8.1 g/dL - - -  Total Bilirubin 0.3 - 1.2 mg/dL - - -  Alkaline Phos 38 - 126 U/L - - -  AST 15 - 41 U/L - - -  ALT 0 - 44 U/L - - -      Component Value Date/Time   RBC 3.74 (L) 02/09/2021 1332   MCV 95.7 02/09/2021 1332   MCH 31.8 02/09/2021 1332   MCHC 33.2 02/09/2021 1332   RDW 13.2 02/09/2021 1332   LYMPHSABS 1.9 02/09/2021 1332   MONOABS 0.8 02/09/2021 1332   EOSABS 0.2 02/09/2021 1332   BASOSABS 0.1 02/09/2021 1332   Lab Results  Component Value Date   TIBC 353 02/09/2021   TIBC 309 10/26/2020   TIBC 308 07/14/2020   FERRITIN 163 02/09/2021   FERRITIN 108 10/26/2020   FERRITIN 289 07/14/2020   IRONPCTSAT 12 (L) 02/09/2021   IRONPCTSAT 15 (L) 10/26/2020   IRONPCTSAT 7 (L) 07/14/2020    DIAGNOSTIC IMAGING:  I have independently reviewed the scans and discussed with the patient. US RENAL  Result Date: 02/16/2021 CLINICAL DATA:  65 year old male with chronic kidney disease. EXAM: RENAL / URINARY TRACT ULTRASOUND COMPLETE COMPARISON:  Renal ultrasound dated 06/29/2020. FINDINGS: Right Kidney: Renal measurements: 14 x 7 x 6.3 cm = volume: 320 mL. Normal echogenicity. There is a 15 mm nonobstructing inferior pole calculus. No hydronephrosis. Renal cysts as  seen on the prior ultrasound measuring up to 5.2 cm in the inferior pole. Left Kidney: Renal measurements: 12.7 x 8.9 x 7.1 cm = volume: 373 ML. Normal echogenicity. No hydronephrosis or shadowing stone. Renal cysts measure up to 5.9 cm in the upper pole. Bladder: Appears normal for degree of bladder distention. Other: None. IMPRESSION: 1. A 15 mm nonobstructing right renal upper pole calculus. No hydronephrosis. 2. Bilateral renal cysts similar to prior ultrasound. Electronically Signed   By: Anner Crete M.D.   On: 02/16/2021 01:26     ASSESSMENT:  1. Normocytic anemia: -CBC on  05/21/2020 shows hemoglobin 7.5 with MCV of 88.7. White count was elevated at 11.2 and platelet count 573. -Ferritin was 127 with percent saturation of 7. -EGD on 05/25/2020 shows normal esophagus, small hiatal hernia, normal duodenum. He had 2 gastric ulcers, the largest measuring 1.75 cm deep benign-appearing prepyloric antral ulcer with no bleeding stigmata. -Biopsy of the ulcer shows nonspecific reactive gastropathy, negative for H. pylori. -He reports that he was taking Aleve every night for arthritic pains, stopped couple of days prior to EGD. -Labs on 06/01/2020 showed SPEP was negative. B12, methylmalonic acid and copper levels were normal. LDH was normal. Free kappa light chains are 53.6, lambda light chain 62.9 and ratio of 0.85. Immunofixation was negative. -Feraheme on 06/05/2020 and 06/12/2020.  2. Social/family history: -He is a non-smoker. 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 -Father had lung cancer and mother had metastatic colon cancer.   PLAN:  1. Normocytic anemia: -Combination anemia from CKD and history of blood loss from gastric ulcer. - He is taking iron tablet twice daily and is tolerating it very well.  He uses prune juice for constipation as needed. - Reviewed labs from 02/09/2021 which showed improvement in ferritin to 163.  Hemoglobin is stable around 11.9. - Continue current regimen of iron tablet.  RTC 4 months with repeat labs.  If they are stable, we will switch him to every 86-monthvisits at that time.  2.  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  He is a non-smoker.  Latest white count is normal.  3. CKD: -Continue to follow-up with Dr. BTheador Hawthorne  4. Hypertension: -Continue Cardizem, Tylenol and lisinopril.  Orders placed this encounter:  Orders Placed This Encounter  Procedures  . CBC with Differential/Platelet  . Ferritin  . Iron and  TIBC     SDerek Jack MD [phone]  I, DMilinda Antis am acting as a scribe for Dr. SSanda Linger  I, SDerek JackMD, have reviewed the above documentation for accuracy and completeness, and I agree with the above.

## 2021-02-16 NOTE — Patient Instructions (Signed)
[place] at [facility] Discharge Instructions  You were seen today by Dr. Delton Coombes. He went over your recent results. Continue taking 2 iron tablets daily. Dr. Delton Coombes will see you back in 4 months for labs and follow up.   Thank you for choosing [facility] to provide your oncology and hematology care.  To afford each patient quality time with our provider, please arrive at least 15 minutes before your scheduled appointment time.   If you have a lab appointment with the [facility] please come in thru the Main Entrance and check in at the main information desk  You need to re-schedule your appointment should you arrive 10 or more minutes late.  We strive to give you quality time with our providers, and arriving late affects you and other patients whose appointments are after yours.  Also, if you no show three or more times for appointments you may be dismissed from the clinic at the providers discretion.     Again, thank you for choosing [facility].  Our hope is that these requests will decrease the amount of time that you wait before being seen by our physicians.       _____________________________________________________________  Should you have questions after your visit to [facility], please contact our office at [phone] between the hours of 8:00 a.m. and 4:30 p.m.  Voicemails left after 4:00 p.m. will not be returned until the following business day.  For prescription refill requests, have your pharmacy contact our office and allow 72 hours.    Cancer Center Support Programs:   > Cancer Support Group  2nd Tuesday of the month 1pm-2pm, Journey Room

## 2021-03-08 ENCOUNTER — Other Ambulatory Visit (HOSPITAL_COMMUNITY)
Admission: RE | Admit: 2021-03-08 | Discharge: 2021-03-08 | Disposition: A | Discharge status: Home / Self Care | Payer: 59 | Source: Ambulatory Visit | Attending: Nephrology | Admitting: Nephrology

## 2021-03-08 ENCOUNTER — Other Ambulatory Visit: Payer: Self-pay

## 2021-03-08 DIAGNOSIS — E1122 Type 2 diabetes mellitus with diabetic chronic kidney disease: Secondary | ICD-10-CM | POA: Diagnosis present

## 2021-03-08 DIAGNOSIS — N189 Chronic kidney disease, unspecified: Secondary | ICD-10-CM | POA: Insufficient documentation

## 2021-03-08 DIAGNOSIS — I129 Hypertensive chronic kidney disease with stage 1 through stage 4 chronic kidney disease, or unspecified chronic kidney disease: Secondary | ICD-10-CM | POA: Insufficient documentation

## 2021-03-08 DIAGNOSIS — R808 Other proteinuria: Secondary | ICD-10-CM | POA: Insufficient documentation

## 2021-03-08 DIAGNOSIS — I5032 Chronic diastolic (congestive) heart failure: Secondary | ICD-10-CM | POA: Insufficient documentation

## 2021-03-08 LAB — PROTEIN / CREATININE RATIO, URINE
Creatinine, Urine: 82.25 mg/dL
Protein Creatinine Ratio: 1.39 mg/mg{Cre} — ABNORMAL HIGH (ref 0.00–0.15)
Total Protein, Urine: 114 mg/dL

## 2021-03-08 LAB — RENAL FUNCTION PANEL
Albumin: 3.8 g/dL (ref 3.5–5.0)
Anion gap: 8 (ref 5–15)
BUN: 42 mg/dL — ABNORMAL HIGH (ref 8–23)
CO2: 24 mmol/L (ref 22–32)
Calcium: 9.5 mg/dL (ref 8.9–10.3)
Chloride: 102 mmol/L (ref 98–111)
Creatinine, Ser: 2.4 mg/dL — ABNORMAL HIGH (ref 0.61–1.24)
GFR, Estimated: 30 mL/min — ABNORMAL LOW (ref >60)
Glucose, Bld: 125 mg/dL — ABNORMAL HIGH (ref 70–99)
Phosphorus: 3.1 mg/dL (ref 2.5–4.6)
Potassium: 3.8 mmol/L (ref 3.5–5.1)
Sodium: 134 mmol/L — ABNORMAL LOW (ref 135–145)

## 2021-03-08 LAB — CBC
HCT: 34.9 % — ABNORMAL LOW (ref 39.0–52.0)
Hemoglobin: 11.8 g/dL — ABNORMAL LOW (ref 13.0–17.0)
MCH: 32.2 pg (ref 26.0–34.0)
MCHC: 33.8 g/dL (ref 30.0–36.0)
MCV: 95.1 fL (ref 80.0–100.0)
Platelets: 324 10*3/uL (ref 150–400)
RBC: 3.67 MIL/uL — ABNORMAL LOW (ref 4.22–5.81)
RDW: 13 % (ref 11.5–15.5)
WBC: 8.4 10*3/uL (ref 4.0–10.5)
nRBC: 0 % (ref 0.0–0.2)

## 2021-05-08 ENCOUNTER — Other Ambulatory Visit: Payer: Self-pay | Admitting: Gastroenterology

## 2021-05-11 ENCOUNTER — Other Ambulatory Visit: Payer: Self-pay

## 2021-05-11 ENCOUNTER — Other Ambulatory Visit (HOSPITAL_COMMUNITY)
Admission: RE | Admit: 2021-05-11 | Discharge: 2021-05-11 | Disposition: A | Discharge status: Home / Self Care | Payer: 59 | Source: Ambulatory Visit | Attending: Nephrology | Admitting: Nephrology

## 2021-05-11 DIAGNOSIS — I5032 Chronic diastolic (congestive) heart failure: Secondary | ICD-10-CM | POA: Diagnosis present

## 2021-05-11 DIAGNOSIS — E1122 Type 2 diabetes mellitus with diabetic chronic kidney disease: Secondary | ICD-10-CM | POA: Diagnosis present

## 2021-05-11 DIAGNOSIS — R808 Other proteinuria: Secondary | ICD-10-CM | POA: Insufficient documentation

## 2021-05-11 DIAGNOSIS — N189 Chronic kidney disease, unspecified: Secondary | ICD-10-CM | POA: Diagnosis present

## 2021-05-11 DIAGNOSIS — N170 Acute kidney failure with tubular necrosis: Secondary | ICD-10-CM | POA: Insufficient documentation

## 2021-05-11 DIAGNOSIS — I129 Hypertensive chronic kidney disease with stage 1 through stage 4 chronic kidney disease, or unspecified chronic kidney disease: Secondary | ICD-10-CM | POA: Diagnosis present

## 2021-05-11 LAB — RENAL FUNCTION PANEL
Albumin: 3.8 g/dL (ref 3.5–5.0)
Anion gap: 8 (ref 5–15)
BUN: 44 mg/dL — ABNORMAL HIGH (ref 8–23)
CO2: 24 mmol/L (ref 22–32)
Calcium: 8.8 mg/dL — ABNORMAL LOW (ref 8.9–10.3)
Chloride: 102 mmol/L (ref 98–111)
Creatinine, Ser: 2.62 mg/dL — ABNORMAL HIGH (ref 0.61–1.24)
GFR, Estimated: 27 mL/min — ABNORMAL LOW (ref >60)
Glucose, Bld: 163 mg/dL — ABNORMAL HIGH (ref 70–99)
Phosphorus: 2.7 mg/dL (ref 2.5–4.6)
Potassium: 3.9 mmol/L (ref 3.5–5.1)
Sodium: 134 mmol/L — ABNORMAL LOW (ref 135–145)

## 2021-05-11 LAB — PROTEIN / CREATININE RATIO, URINE
Creatinine, Urine: 96.18 mg/dL
Protein Creatinine Ratio: 0.95 mg/mg{Cre} — ABNORMAL HIGH (ref 0.00–0.15)
Total Protein, Urine: 91 mg/dL

## 2021-05-11 LAB — CBC
HCT: 33.8 % — ABNORMAL LOW (ref 39.0–52.0)
Hemoglobin: 11.2 g/dL — ABNORMAL LOW (ref 13.0–17.0)
MCH: 31.8 pg (ref 26.0–34.0)
MCHC: 33.1 g/dL (ref 30.0–36.0)
MCV: 96 fL (ref 80.0–100.0)
Platelets: 306 10*3/uL (ref 150–400)
RBC: 3.52 MIL/uL — ABNORMAL LOW (ref 4.22–5.81)
RDW: 13 % (ref 11.5–15.5)
WBC: 9.1 10*3/uL (ref 4.0–10.5)
nRBC: 0 % (ref 0.0–0.2)

## 2021-05-17 ENCOUNTER — Encounter: Payer: Self-pay | Admitting: Internal Medicine

## 2021-06-16 DIAGNOSIS — M5416 Radiculopathy, lumbar region: Secondary | ICD-10-CM | POA: Insufficient documentation

## 2021-06-22 ENCOUNTER — Other Ambulatory Visit: Payer: Self-pay

## 2021-06-22 ENCOUNTER — Inpatient Hospital Stay (HOSPITAL_COMMUNITY): Payer: 59 | Attending: Hematology

## 2021-06-22 DIAGNOSIS — N189 Chronic kidney disease, unspecified: Secondary | ICD-10-CM | POA: Diagnosis not present

## 2021-06-22 DIAGNOSIS — D509 Iron deficiency anemia, unspecified: Secondary | ICD-10-CM

## 2021-06-22 DIAGNOSIS — D631 Anemia in chronic kidney disease: Secondary | ICD-10-CM | POA: Insufficient documentation

## 2021-06-22 LAB — IRON AND TIBC
Iron: 51 ug/dL (ref 45–182)
Saturation Ratios: 16 % — ABNORMAL LOW (ref 17.9–39.5)
TIBC: 321 ug/dL (ref 250–450)
UIBC: 270 ug/dL

## 2021-06-22 LAB — CBC WITH DIFFERENTIAL/PLATELET
Abs Immature Granulocytes: 0.06 10*3/uL (ref 0.00–0.07)
Basophils Absolute: 0 10*3/uL (ref 0.0–0.1)
Basophils Relative: 0 %
Eosinophils Absolute: 0.1 10*3/uL (ref 0.0–0.5)
Eosinophils Relative: 2 %
HCT: 32.9 % — ABNORMAL LOW (ref 39.0–52.0)
Hemoglobin: 10.7 g/dL — ABNORMAL LOW (ref 13.0–17.0)
Immature Granulocytes: 1 %
Lymphocytes Relative: 21 %
Lymphs Abs: 1.9 10*3/uL (ref 0.7–4.0)
MCH: 31.6 pg (ref 26.0–34.0)
MCHC: 32.5 g/dL (ref 30.0–36.0)
MCV: 97.1 fL (ref 80.0–100.0)
Monocytes Absolute: 0.9 10*3/uL (ref 0.1–1.0)
Monocytes Relative: 10 %
Neutro Abs: 6 10*3/uL (ref 1.7–7.7)
Neutrophils Relative %: 66 %
Platelets: 280 10*3/uL (ref 150–400)
RBC: 3.39 MIL/uL — ABNORMAL LOW (ref 4.22–5.81)
RDW: 13.2 % (ref 11.5–15.5)
WBC: 9.1 10*3/uL (ref 4.0–10.5)
nRBC: 0.2 % (ref 0.0–0.2)

## 2021-06-22 LAB — FERRITIN: Ferritin: 177 ng/mL (ref 24–336)

## 2021-06-28 NOTE — Progress Notes (Signed)
[facility] [place], [place] [address]   CLINIC:  Medical Oncology/Hematology  PCP:  Everardo Beals, NP [place] / [place] [address]  [phone]  REASON FOR VISIT:  Follow-up for IDA  PRIOR THERAPY:  Intermittent Feraheme last on 06/12/2020  CURRENT THERAPY: Oral iron tablets daily  INTERVAL HISTORY:  [name], a 65 y.o. male, returns for routine follow-up for his IDA. Murray was last seen on 02/16/2021.  Today he reports feeling well. Her is taking iron BID and tolerating it well. He denies ankle swellings, black stools, bleeding issues, and constipation. He reports fatigue. He is no longer taking lisinopril, Advil, or amlodipine. He takes Lasix BID. He reports cramping in his calves and ankles.   REVIEW OF SYSTEMS:  Review of Systems  Constitutional:  Positive for fatigue (depleted). Negative for appetite change.  HENT:   Negative for nosebleeds.   Respiratory:  Negative for hemoptysis.   Cardiovascular:  Negative for leg swelling.  Gastrointestinal:  Negative for blood in stool and constipation.  Genitourinary:  Negative for hematuria.   Musculoskeletal:  Positive for myalgias (cramping in calves and ankles).  Hematological:  Does not bruise/bleed easily.  All other systems reviewed and are negative.  PAST MEDICAL/SURGICAL HISTORY:  Past Medical History:  Diagnosis Date   Arthritis    Diabetes mellitus without complication ([facility])    Gout    History of kidney stones    Hyperlipidemia    Hypertension    Past Surgical History:  Procedure Laterality Date   BIOPSY  05/25/2020   Procedure: BIOPSY;  Surgeon: Daneil Dolin, MD;  [facility];  Service: Endoscopy;;   CATARACT EXTRACTION W/PHACO Right 05/13/2019   Procedure: CATARACT EXTRACTION PHACO AND INTRAOCULAR LENS PLACEMENT ([facility]);  Surgeon: Baruch Goldmann, MD;  Location: [facility]S;  Service: Ophthalmology;  Laterality: Right;  CDE: 10.31    CATARACT EXTRACTION W/PHACO Left 05/27/2019   Procedure: CATARACT EXTRACTION PHACO AND INTRAOCULAR LENS PLACEMENT ([facility]);  Surgeon: Baruch Goldmann, MD;  Location: [facility];  Service: Ophthalmology;  Laterality: Left;  CDE: 6.49   COLONOSCOPY     times two 2002 ,2007   COLONOSCOPY N/A 10/07/2013   Procedure: COLONOSCOPY;  Surgeon: Daneil Dolin, MD;  Location[facility];  Service: Endoscopy;  Laterality: N/A;  8:30 AM   COLONOSCOPY N/A 10/05/2018   Dr. Gala Romney: Diverticulosis.  Next colonoscopy 5 years due to first-degree relative with colon cancer   ESOPHAGOGASTRODUODENOSCOPY (EGD) WITH PROPOFOL N/A 05/25/2020   [name]: 2 gastric ulcers.  No H. pylori.  Likely NSAID related.  Repeat EGD in 3 months   ESOPHAGOGASTRODUODENOSCOPY (EGD) WITH PROPOFOL N/A 09/17/2020   Procedure: ESOPHAGOGASTRODUODENOSCOPY (EGD) WITH PROPOFOL;  Surgeon: Daneil Dolin, MD;  Location[facility];  Service: Endoscopy;  Laterality: N/A;  10:00am   JOINT REPLACEMENT     KNEE ARTHROPLASTY Left 06/27/2019   Procedure: COMPUTER ASSISTED TOTAL KNEE ARTHROPLASTY;  Surgeon: Rod Can, MD;  Location: [facility];  Service: Orthopedics;  Laterality: Left;   KNEE ARTHROPLASTY Right 08/15/2019   Procedure: COMPUTER ASSISTED TOTAL KNEE ARTHROPLASTY;  Surgeon: Rod Can, MD;  Location: [facility];  Service: Orthopedics;  Laterality: Right;   LEG SURGERY Right early 30's    SOCIAL HISTORY:  Social History   Socioeconomic History   Marital status: Married    Spouse name: Not on file   Number of children: 2   Years of education: Not on file  Highest education level: Not on file  Occupational History   Occupation: Truck Geophysicist/field seismologist    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  3 grandchildren. Married for 34 years   Social Determinants of Radio broadcast assistant Strain: Not on file  Food Insecurity: Not on file  Transportation Needs: Not on file  Physical Activity: Not on file  Stress: Not on file  Social Connections: Not on file  Intimate Partner Violence: Not on file    FAMILY HISTORY:  Family History  Problem Relation Age of Onset   Colon cancer Mother        She died from her malignancy, 48   Lung cancer Father        Died from lung cancer, age 67   Emphysema Brother        Died at 63 from lung disease   COPD Brother    Dementia Sister    Healthy Son    Healthy Daughter     CURRENT MEDICATIONS:  Current Outpatient Medications  Medication Sig Dispense Refill   acetaminophen (TYLENOL) 500 MG tablet Take 500 mg by mouth every 6 (six) hours as needed for mild pain. (Patient not taking: Reported on 06/29/2021)     allopurinol (ZYLOPRIM) 300 MG tablet Take 300 mg by mouth daily.     Apple Cider Vinegar 500 MG TABS Take 500 mg by mouth daily.      aspirin EC 81 MG tablet Take 81 mg by mouth daily. Swallow whole.     carvedilol (COREG) 25 MG tablet Take 25 mg by mouth 2 (two) times daily.     Co-Enzyme Q10 100 MG CAPS Take 100 mg by mouth daily.      Cranberry 500 MG TABS Take 500 mg by mouth daily.     fenofibrate (TRICOR) 145 MG tablet Take 145 mg by mouth daily.     ferrous sulfate 325 (65 FE) MG tablet Take 325 mg by mouth daily with breakfast.     Flaxseed, Linseed, (FLAXSEED OIL) 1000 MG CAPS Take 1,000 mg by mouth daily.     furosemide (LASIX) 20 MG tablet furosemide 20 mg tablet  Take 1 tablet twice a day by oral route.     glipiZIDE (GLUCOTROL XL) 10 MG 24 hr tablet Take 10 mg by mouth daily as needed (If blood sugar is > 130).      glucose blood test strip 1 each by Other route as needed for other. Use as instructed     hydrALAZINE (APRESOLINE) 100 MG  tablet Take 100 mg by mouth 3 (three) times daily.     Krill Oil 500 MG CAPS Take 500 mg by mouth 2 (two) times a day.     methocarbamol (ROBAXIN) 500 MG tablet Take 500 mg by mouth 2 (two) times daily as needed.     Multiple Vitamin (MULTIVITAMIN) tablet Take 1 tablet by mouth daily.     Niacin CR 1000 MG TBCR niacin ER 1,000 mg tablet,extended release 24 hr     pantoprazole (PROTONIX) 40 MG tablet TAKE 1  TABLET (40 MG TOTAL) BY MOUTH 2 (TWO) TIMES DAILY BEFORE A MEAL. 180 tablet 1   pravastatin (PRAVACHOL) 40 MG tablet Take 40 mg by mouth at bedtime.      traZODone (DESYREL) 50 MG tablet Take 50 mg by mouth at bedtime.      triamcinolone cream (KENALOG) 0.1 % Apply 1 application topically 2 (two) times daily as needed (rash).      Turmeric 500 MG CAPS Take 500 mg by mouth daily.     No current facility-administered medications for this visit.    ALLERGIES:  No Known Allergies  PHYSICAL EXAM:  Performance status (ECOG): 1 - Symptomatic but completely ambulatory  Vitals:   06/29/21 1531  BP: (!) 152/92  Pulse: 93  Resp: 18  Temp: (!) 96.9 F (36.1 C)  SpO2: 95%   Wt Readings from Last 3 Encounters:  06/29/21 238 lb 8 oz (108.2 kg)  02/16/21 231 lb 3.2 oz (104.9 kg)  12/16/20 220 lb 9.6 oz (100.1 kg)   Physical Exam Vitals reviewed.  Constitutional:      Appearance: Normal appearance. He is obese.  Cardiovascular:     Rate and Rhythm: Normal rate and regular rhythm.     Pulses: Normal pulses.     Heart sounds: Normal heart sounds.  Pulmonary:     Effort: Pulmonary effort is normal.     Breath sounds: Normal breath sounds.  Neurological:     General: No focal deficit present.     Mental Status: He is alert and oriented to person, place, and time.  Psychiatric:        Mood and Affect: Mood normal.        Behavior: Behavior normal.    LABORATORY DATA:  I have reviewed the labs as listed.  CBC Latest Ref Rng & Units 06/22/2021 05/11/2021 03/08/2021  WBC 4.0 - 10.5 K/uL  9.1 9.1 8.4  Hemoglobin 13.0 - 17.0 g/dL 10.7(L) 11.2(L) 11.8(L)  Hematocrit 39.0 - 52.0 % 32.9(L) 33.8(L) 34.9(L)  Platelets 150 - 400 K/uL 280 306 324   CMP Latest Ref Rng & Units 05/11/2021 03/08/2021 02/16/2021  Glucose 70 - 99 mg/dL 163(H) 125(H) 82  BUN 8 - 23 mg/dL 44(H) 42(H) 51(H)  Creatinine 0.61 - 1.24 mg/dL 2.62(H) 2.40(H) 2.71(H)  Sodium 135 - 145 mmol/L 134(L) 134(L) 137  Potassium 3.5 - 5.1 mmol/L 3.9 3.8 4.1  Chloride 98 - 111 mmol/L 102 102 106  CO2 22 - 32 mmol/L '24 24 23  '$ Calcium 8.9 - 10.3 mg/dL 8.8(L) 9.5 9.7  Total Protein 6.5 - 8.1 g/dL - - -  Total Bilirubin 0.3 - 1.2 mg/dL - - -  Alkaline Phos 38 - 126 U/L - - -  AST 15 - 41 U/L - - -  ALT 0 - 44 U/L - - -      Component Value Date/Time   RBC 3.39 (L) 06/22/2021 1517   MCV 97.1 06/22/2021 1517   MCH 31.6 06/22/2021 1517   MCHC 32.5 06/22/2021 1517   RDW 13.2 06/22/2021 1517   LYMPHSABS 1.9 06/22/2021 1517   MONOABS 0.9 06/22/2021 1517   EOSABS 0.1 06/22/2021 1517   BASOSABS 0.0 06/22/2021 1517    DIAGNOSTIC IMAGING:  I have independently reviewed the scans and discussed with the patient. No results found.   ASSESSMENT:  1.  Normocytic anemia: -CBC on 05/21/2020 shows hemoglobin 7.5 with MCV of 88.7.  White count was elevated at 11.2 and platelet count 573. -Ferritin was 127 with  percent saturation of 7. -EGD on 05/25/2020 shows normal esophagus, small hiatal hernia, normal duodenum.  He had 2 gastric ulcers, the largest measuring 1.75 cm deep benign-appearing prepyloric antral ulcer with no bleeding stigmata. -Biopsy of the ulcer shows nonspecific reactive gastropathy, negative for H. pylori. -He reports that he was taking Aleve every night for arthritic pains, stopped couple of days prior to EGD. -Labs on 06/01/2020 showed SPEP was negative.  B12, methylmalonic acid and copper levels were normal.  LDH was normal.  Free kappa light chains are 53.6, lambda light chain 62.9 and ratio of 0.85.  Immunofixation  was negative. -Feraheme on 06/05/2020 and 06/12/2020.   2.  Social/family history: -He is a non-smoker.  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 -Father had lung cancer and mother had metastatic colon cancer.   PLAN:  1.  Normocytic anemia: -Combination anemia from CKD and relative iron deficiency.  He also had a history of bleeding gastric ulcer which is healed now. - He is taking iron tablet twice daily and is tolerating fairly well. - Reviewed labs from 06/22/2021 with hemoglobin dropping to 10.7.  Ferritin is 177 and percent saturation is 16. - I have recommended parenteral iron therapy as he is also feeling tired.  Does not report any bleeding per rectum or melena. - We will give him Venofer 300 mg x 3 doses. - RTC 4 months for follow-up with repeat labs.   2.  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 - He is non-smoker.  Latest CBC shows white count is 9.1 with normal differential.   3.  CKD: -Continue follow-up with Dr. Theador Hawthorne.   4.  Hypertension: -Continue Coreg twice daily and hydralazine 100 mg 3 times daily.  Orders placed this encounter:  No orders of the defined types were placed in this encounter.    Derek Jack, MD [place] [phone]   I, Thana Ates, am acting as a scribe for Dr. Derek Jack.  I, Derek Jack MD, have reviewed the above documentation for accuracy and completeness, and I agree with the above.

## 2021-06-29 ENCOUNTER — Inpatient Hospital Stay (HOSPITAL_BASED_OUTPATIENT_CLINIC_OR_DEPARTMENT_OTHER): Payer: 59 | Admitting: Hematology

## 2021-06-29 ENCOUNTER — Other Ambulatory Visit: Payer: Self-pay

## 2021-06-29 VITALS — BP 152/92 | HR 93 | Temp 96.9°F | Resp 18 | Wt 238.5 lb

## 2021-06-29 DIAGNOSIS — D649 Anemia, unspecified: Secondary | ICD-10-CM | POA: Diagnosis not present

## 2021-06-29 DIAGNOSIS — D509 Iron deficiency anemia, unspecified: Secondary | ICD-10-CM

## 2021-06-29 DIAGNOSIS — N189 Chronic kidney disease, unspecified: Secondary | ICD-10-CM | POA: Diagnosis not present

## 2021-06-29 NOTE — Patient Instructions (Addendum)
[facility] at [facility] °Discharge Instructions ° °You were seen today by Dr. Katragadda. He went over your recent results. Dr. Katragadda will see you back in 4 months for labs and follow up. ° ° °Thank you for choosing [facility] at [facility] to provide your oncology and hematology care.  To afford each patient quality time with our provider, please arrive at least 15 minutes before your scheduled appointment time.  ° °If you have a lab appointment with the Cancer Center please come in thru the Main Entrance and check in at the main information desk ° °You need to re-schedule your appointment should you arrive 10 or more minutes late.  We strive to give you quality time with our providers, and arriving late affects you and other patients whose appointments are after yours.  Also, if you no show three or more times for appointments you may be dismissed from the clinic at the providers discretion.     °Again, thank you for choosing [facility].  Our hope is that these requests will decrease the amount of time that you wait before being seen by our physicians.       °_____________________________________________________________ ° °Should you have questions after your visit to  Cancer Center, please contact our office at [phone] between the hours of 8:00 a.m. and 4:30 p.m.  Voicemails left after 4:00 p.m. will not be returned until the following business day.  For prescription refill requests, have your pharmacy contact our office and allow 72 hours.   ° °Cancer Center Support Programs:  ° °> Cancer Support Group  °2nd Tuesday of the month 1pm-2pm, Journey Room  ° ° °

## 2021-07-02 ENCOUNTER — Other Ambulatory Visit: Payer: Self-pay

## 2021-07-02 ENCOUNTER — Inpatient Hospital Stay (HOSPITAL_COMMUNITY): Payer: 59

## 2021-07-02 VITALS — BP 150/89 | HR 87 | Temp 97.7°F | Resp 18

## 2021-07-02 DIAGNOSIS — N1832 Chronic kidney disease, stage 3b: Secondary | ICD-10-CM

## 2021-07-02 DIAGNOSIS — N189 Chronic kidney disease, unspecified: Secondary | ICD-10-CM | POA: Diagnosis not present

## 2021-07-02 MED ORDER — SODIUM CHLORIDE 0.9 % IV SOLN: Freq: Once | INTRAVENOUS | Status: AC

## 2021-07-02 MED ORDER — SODIUM CHLORIDE 0.9 % IV SOLN
300.0000 mg | Freq: Once | INTRAVENOUS | Status: AC
  Administered 2021-07-02: 300 mg via INTRAVENOUS
  Filled 2021-07-02: qty 300

## 2021-07-02 NOTE — Patient Instructions (Signed)
[facility]  Discharge Instructions: Thank you for choosing [facility] to provide your oncology and hematology care.  If you have a lab appointment with the [facility], please come in thru the Main Entrance and check in at the main information desk.  Wear comfortable clothing and clothing appropriate for easy access to any Portacath or PICC line.   We strive to give you quality time with your provider. You may need to reschedule your appointment if you arrive late (15 or more minutes).  Arriving late affects you and other patients whose appointments are after yours.  Also, if you miss three or more appointments without notifying the office, you may be dismissed from the clinic at the provider's discretion.      For prescription refill requests, have your pharmacy contact our office and allow 72 hours for refills to be completed.    Today you received the following chemotherapy and/or immunotherapy agents venofer      To help prevent nausea and vomiting after your treatment, we encourage you to take your nausea medication as directed.  BELOW ARE SYMPTOMS THAT SHOULD BE REPORTED IMMEDIATELY: *FEVER GREATER THAN 100.4 F (38 C) OR HIGHER *CHILLS OR SWEATING *NAUSEA AND VOMITING THAT IS NOT CONTROLLED WITH YOUR NAUSEA MEDICATION *UNUSUAL SHORTNESS OF BREATH *UNUSUAL BRUISING OR BLEEDING *URINARY PROBLEMS (pain or burning when urinating, or frequent urination) *BOWEL PROBLEMS (unusual diarrhea, constipation, pain near the anus) TENDERNESS IN MOUTH AND THROAT WITH OR WITHOUT PRESENCE OF ULCERS (sore throat, sores in mouth, or a toothache) UNUSUAL RASH, SWELLING OR PAIN  UNUSUAL VAGINAL DISCHARGE OR ITCHING   Items with * indicate a potential emergency and should be followed up as soon as possible or go to the Emergency Department if any problems should occur.  Please show the CHEMOTHERAPY ALERT CARD or IMMUNOTHERAPY ALERT CARD at check-in to the Emergency  Department and triage nurse.  Should you have questions after your visit or need to cancel or reschedule your appointment, please contact [facility] [phone]  and follow the prompts.  Office hours are 8:00 a.m. to 4:30 p.m. Monday - Friday. Please note that voicemails left after 4:00 p.m. may not be returned until the following business day.  We are closed weekends and major holidays. You have access to a nurse at all times for urgent questions. Please call the main number to the clinic [phone] and follow the prompts.  For any non-urgent questions, you may also contact your provider using MyChart. We now offer e-Visits for anyone 24 and older to request care online for non-urgent symptoms. For details visit [email or web address].   Also download the MyChart app! Go to the app store, search "MyChart", open the app, select [facility], and log in with your MyChart username and password.  Due to Covid, a mask is required upon entering the hospital/clinic. If you do not have a mask, one will be given to you upon arrival. For doctor visits, patients may have 1 support person aged 63 or older with them. For treatment visits, patients cannot have anyone with them due to current Covid guidelines and our immunocompromised population.

## 2021-07-02 NOTE — Progress Notes (Signed)
Patient presents today for Venofer infusion per providers order.  Vital signs stable.  Patient has no new complaints at this time.  Peripheral IV started and blood return noted pre and post infusion.  Venofer infusion given today per MD orders.  Stable during infusion without adverse affects.  Vital signs stable.  No complaints at this time.  Discharge from clinic ambulatory in stable condition.  Alert and oriented X 3.  Follow up with [facility] as scheduled.

## 2021-07-08 MED FILL — Iron Sucrose Inj 20 MG/ML (Fe Equiv): INTRAVENOUS | Qty: 15 | Status: AC

## 2021-07-09 ENCOUNTER — Other Ambulatory Visit: Payer: Self-pay

## 2021-07-09 ENCOUNTER — Inpatient Hospital Stay (HOSPITAL_COMMUNITY): Payer: 59

## 2021-07-09 ENCOUNTER — Encounter (HOSPITAL_COMMUNITY): Payer: Self-pay

## 2021-07-09 VITALS — BP 164/97 | HR 80 | Temp 97.5°F | Resp 18

## 2021-07-09 DIAGNOSIS — D631 Anemia in chronic kidney disease: Secondary | ICD-10-CM

## 2021-07-09 DIAGNOSIS — N1832 Chronic kidney disease, stage 3b: Secondary | ICD-10-CM

## 2021-07-09 DIAGNOSIS — N189 Chronic kidney disease, unspecified: Secondary | ICD-10-CM | POA: Diagnosis not present

## 2021-07-09 MED ORDER — IRON SUCROSE 20 MG/ML IV SOLN
300.0000 mg | Freq: Once | INTRAVENOUS | Status: AC
  Administered 2021-07-09: 300 mg via INTRAVENOUS
  Filled 2021-07-09: qty 300

## 2021-07-09 MED ORDER — SODIUM CHLORIDE 0.9 % IV SOLN: INTRAVENOUS | Status: DC

## 2021-07-09 NOTE — Patient Instructions (Signed)
[facility]  Discharge Instructions: Thank you for choosing [facility] to provide your oncology and hematology care.  If you have a lab appointment with the Cancer Center, please come in thru the Main Entrance and check in at the main information desk.  Wear comfortable clothing and clothing appropriate for easy access to any Portacath or PICC line.   We strive to give you quality time with your provider. You may need to reschedule your appointment if you arrive late (15 or more minutes).  Arriving late affects you and other patients whose appointments are after yours.  Also, if you miss three or more appointments without notifying the office, you may be dismissed from the clinic at the provider's discretion.      For prescription refill requests, have your pharmacy contact our office and allow 72 hours for refills to be completed.    Today you received the following: Venofer, return as scheduled.   To help prevent nausea and vomiting after your treatment, we encourage you to take your nausea medication as directed.  BELOW ARE SYMPTOMS THAT SHOULD BE REPORTED IMMEDIATELY: *FEVER GREATER THAN 100.4 F (38 C) OR HIGHER *CHILLS OR SWEATING *NAUSEA AND VOMITING THAT IS NOT CONTROLLED WITH YOUR NAUSEA MEDICATION *UNUSUAL SHORTNESS OF BREATH *UNUSUAL BRUISING OR BLEEDING *URINARY PROBLEMS (pain or burning when urinating, or frequent urination) *BOWEL PROBLEMS (unusual diarrhea, constipation, pain near the anus) TENDERNESS IN MOUTH AND THROAT WITH OR WITHOUT PRESENCE OF ULCERS (sore throat, sores in mouth, or a toothache) UNUSUAL RASH, SWELLING OR PAIN  UNUSUAL VAGINAL DISCHARGE OR ITCHING   Items with * indicate a potential emergency and should be followed up as soon as possible or go to the Emergency Department if any problems should occur.  Please show the CHEMOTHERAPY ALERT CARD or IMMUNOTHERAPY ALERT CARD at check-in to the Emergency Department and triage  nurse.  Should you have questions after your visit or need to cancel or reschedule your appointment, please contact [facility] [phone]  and follow the prompts.  Office hours are 8:00 a.m. to 4:30 p.m. Monday - Friday. Please note that voicemails left after 4:00 p.m. may not be returned until the following business day.  We are closed weekends and major holidays. You have access to a nurse at all times for urgent questions. Please call the main number to the clinic [phone] and follow the prompts.  For any non-urgent questions, you may also contact your provider using MyChart. We now offer e-Visits for anyone 18 and older to request care online for non-urgent symptoms. For details visit [email or web address].   Also download the MyChart app! Go to the app store, search "MyChart", open the app, select [facility], and log in with your MyChart username and password.  Due to Covid, a mask is required upon entering the hospital/clinic. If you do not have a mask, one will be given to you upon arrival. For doctor visits, patients may have 1 support person aged 18 or older with them. For treatment visits, patients cannot have anyone with them due to current Covid guidelines and our immunocompromised population.  

## 2021-07-09 NOTE — Progress Notes (Signed)
Patient tolerated iron infusion with no complaints voiced.  Peripheral IV site clean and dry with good blood return noted before and after infusion.  Band aid applied.  VSS with discharge and left in satisfactory condition with no s/s of distress noted.   

## 2021-07-16 ENCOUNTER — Inpatient Hospital Stay (HOSPITAL_COMMUNITY): Payer: 59

## 2021-07-16 ENCOUNTER — Other Ambulatory Visit: Payer: Self-pay

## 2021-07-16 VITALS — BP 151/85 | HR 71 | Temp 98.2°F | Resp 18

## 2021-07-16 DIAGNOSIS — N189 Chronic kidney disease, unspecified: Secondary | ICD-10-CM | POA: Diagnosis not present

## 2021-07-16 DIAGNOSIS — N1832 Chronic kidney disease, stage 3b: Secondary | ICD-10-CM

## 2021-07-16 MED ORDER — SODIUM CHLORIDE 0.9 % IV SOLN: INTRAVENOUS | Status: DC

## 2021-07-16 MED ORDER — SODIUM CHLORIDE 0.9% FLUSH
10.0000 mL | Freq: Once | INTRAVENOUS | Status: AC
  Administered 2021-07-16: 10 mL via INTRAVENOUS

## 2021-07-16 MED ORDER — SODIUM CHLORIDE 0.9 % IV SOLN
300.0000 mg | Freq: Once | INTRAVENOUS | Status: AC
  Administered 2021-07-16: 300 mg via INTRAVENOUS
  Filled 2021-07-16: qty 300

## 2021-07-16 NOTE — Patient Instructions (Signed)
[facility]  Discharge Instructions: Thank you for choosing [facility] to provide your oncology and hematology care.  If you have a lab appointment with the [facility], please come in thru the Main Entrance and check in at the main information desk.  Wear comfortable clothing and clothing appropriate for easy access to any Portacath or PICC line.   We strive to give you quality time with your provider. You may need to reschedule your appointment if you arrive late (15 or more minutes).  Arriving late affects you and other patients whose appointments are after yours.  Also, if you miss three or more appointments without notifying the office, you may be dismissed from the clinic at the provider's discretion.      For prescription refill requests, have your pharmacy contact our office and allow 72 hours for refills to be completed.    Today you received the following Venofer IV iron infusion.     BELOW ARE SYMPTOMS THAT SHOULD BE REPORTED IMMEDIATELY: *FEVER GREATER THAN 100.4 F (38 C) OR HIGHER *CHILLS OR SWEATING *NAUSEA AND VOMITING THAT IS NOT CONTROLLED WITH YOUR NAUSEA MEDICATION *UNUSUAL SHORTNESS OF BREATH *UNUSUAL BRUISING OR BLEEDING *URINARY PROBLEMS (pain or burning when urinating, or frequent urination) *BOWEL PROBLEMS (unusual diarrhea, constipation, pain near the anus) TENDERNESS IN MOUTH AND THROAT WITH OR WITHOUT PRESENCE OF ULCERS (sore throat, sores in mouth, or a toothache) UNUSUAL RASH, SWELLING OR PAIN  UNUSUAL VAGINAL DISCHARGE OR ITCHING   Items with * indicate a potential emergency and should be followed up as soon as possible or go to the Emergency Department if any problems should occur.  Please show the CHEMOTHERAPY ALERT CARD or IMMUNOTHERAPY ALERT CARD at check-in to the Emergency Department and triage nurse.  Should you have questions after your visit or need to cancel or reschedule your appointment, please contact [facility] [phone]  and follow the prompts.  Office hours are 8:00 a.m. to 4:30 p.m. Monday - Friday. Please note that voicemails left after 4:00 p.m. may not be returned until the following business day.  We are closed weekends and major holidays. You have access to a nurse at all times for urgent questions. Please call the main number to the clinic [phone] and follow the prompts.  For any non-urgent questions, you may also contact your provider using MyChart. We now offer e-Visits for anyone 48 and older to request care online for non-urgent symptoms. For details visit [email or web address].   Also download the MyChart app! Go to the app store, search "MyChart", open the app, select [facility], and log in with your MyChart username and password.  Due to Covid, a mask is required upon entering the hospital/clinic. If you do not have a mask, one will be given to you upon arrival. For doctor visits, patients may have 1 support person aged 4 or older with them. For treatment visits, patients cannot have anyone with them due to current Covid guidelines and our immunocompromised population.

## 2021-07-16 NOTE — Progress Notes (Signed)
Pt presents today for Venofer IV iron infusion per provider's order. Vital signs stable and pt voiced no new complaints at this time.  Peripheral IV started with good blood return pre and post infusion.  Venofer IV iron infusion. given today per MD orders. Tolerated infusion without adverse affects. Vital signs stable. No complaints at this time. Discharged from clinic ambulatory in stable condition. Alert and oriented x 3. F/U with [facility] as scheduled.

## 2021-09-22 ENCOUNTER — Ambulatory Visit (INDEPENDENT_AMBULATORY_CARE_PROVIDER_SITE_OTHER): Payer: 59 | Admitting: Gastroenterology

## 2021-09-22 ENCOUNTER — Other Ambulatory Visit: Payer: Self-pay

## 2021-09-22 ENCOUNTER — Encounter: Payer: Self-pay | Admitting: Gastroenterology

## 2021-09-22 DIAGNOSIS — D509 Iron deficiency anemia, unspecified: Secondary | ICD-10-CM | POA: Insufficient documentation

## 2021-09-22 DIAGNOSIS — K5900 Constipation, unspecified: Secondary | ICD-10-CM

## 2021-09-22 NOTE — Progress Notes (Signed)
Primary Care Physician: Everardo Beals, NP  Primary Gastroenterologist:  Garfield Cornea, MD   Chief Complaint  Patient presents with   Anemia    F/u    HPI: Bryan Atkinson is a 65 y.o. male here for follow up of IDA,  history of gastric ulcer. Last seen in 12/2020.  Patient also has chronic kidney disease, stage IV with associated anemia of chronic disease, followed by Dr. Theador Hawthorne. Also followed by hematology receiving intermittent IV iron, last infusion in September.  EGD in August 2021 showed 2 gastric ulcers.  No H. pylori.  Likely NSAID related, taking Aleve at the time.  He had a follow-up EGD in December 2021 showing gastric scar status post healed ulcer.  Last colonoscopy in 2019 with diverticulosis, 5-year follow-up recommended due to mother having colon cancer.  Labs in September 2022: Iron 51, TIBC 321, iron saturation 16%, hemoglobin 10.7, hematocrit 32.9, ferritin 177.  Labs October 2022, hemoglobin 11.4.  Today: feels well. Has some constipation well managed with prune juice. No abdominal pain. No melena, brbpr. Denies having heartburn or reflux in the past. Wants to try and come off pantoprazole. No longer on NSAIDs. Takes ASA 81mg  daily.      Current Outpatient Medications  Medication Sig Dispense Refill   acetaminophen (TYLENOL) 500 MG tablet Take 500 mg by mouth every 6 (six) hours as needed for mild pain.     allopurinol (ZYLOPRIM) 300 MG tablet Take 300 mg by mouth daily.     Apple Cider Vinegar 500 MG TABS Take 500 mg by mouth daily.      aspirin EC 81 MG tablet Take 81 mg by mouth daily. Swallow whole.     carvedilol (COREG) 25 MG tablet Take 25 mg by mouth 2 (two) times daily.     Co-Enzyme Q10 100 MG CAPS Take 100 mg by mouth daily.      Cranberry 500 MG TABS Take 500 mg by mouth daily.     fenofibrate (TRICOR) 145 MG tablet Take 145 mg by mouth daily.     ferrous sulfate 325 (65 FE) MG tablet Take 325 mg by mouth daily with breakfast.      Flaxseed, Linseed, (FLAXSEED OIL) 1000 MG CAPS Take 1,000 mg by mouth daily.     furosemide (LASIX) 20 MG tablet furosemide 20 mg tablet  Take 1 tablet twice a day by oral route.     glipiZIDE (GLUCOTROL XL) 10 MG 24 hr tablet Take 10 mg by mouth daily as needed (If blood sugar is > 130).      glucose blood test strip 1 each by Other route as needed for other. Use as instructed     hydrALAZINE (APRESOLINE) 100 MG tablet Take 100 mg by mouth 3 (three) times daily.     Krill Oil 500 MG CAPS Take 500 mg by mouth 2 (two) times a day.     methocarbamol (ROBAXIN) 500 MG tablet Take 500 mg by mouth 2 (two) times daily as needed.     Multiple Vitamin (MULTIVITAMIN) tablet Take 1 tablet by mouth daily.     Niacin CR 1000 MG TBCR niacin ER 1,000 mg tablet,extended release 24 hr     pantoprazole (PROTONIX) 40 MG tablet TAKE 1 TABLET (40 MG TOTAL) BY MOUTH 2 (TWO) TIMES DAILY BEFORE A MEAL. 180 tablet 1   pravastatin (PRAVACHOL) 40 MG tablet Take 40 mg by mouth at bedtime.      traZODone (DESYREL) 50 MG  tablet Take 50 mg by mouth at bedtime.      triamcinolone cream (KENALOG) 0.1 % Apply 1 application topically 2 (two) times daily as needed (rash).      Turmeric 500 MG CAPS Take 500 mg by mouth daily.     No current facility-administered medications for this visit.    Allergies as of 09/22/2021   (No Known Allergies)    ROS:  General: Negative for anorexia, weight loss, fever, chills, fatigue, weakness. ENT: Negative for hoarseness, difficulty swallowing , nasal congestion. CV: Negative for chest pain, angina, palpitations, dyspnea on exertion, peripheral edema.  Respiratory: Negative for dyspnea at rest, dyspnea on exertion, cough, sputum, wheezing.  GI: See history of present illness. GU:  Negative for dysuria, hematuria, urinary incontinence, urinary frequency, nocturnal urination.  Endo: Negative for unusual weight change.    Physical Examination:   BP (!) 174/98   Pulse 92   Temp (!)  97.5 F (36.4 C)   Ht 5\' 9"  (1.753 m)   Wt 240 lb 6.4 oz (109 kg)   BMI 35.50 kg/m   General: Well-nourished, well-developed in no acute distress.  Eyes: No icterus. Mouth: masked Lungs: Clear to auscultation bilaterally.  Heart: Regular rate and rhythm, no murmurs rubs or gallops.  Abdomen: Bowel sounds are normal, nontender, nondistended, no hepatosplenomegaly or masses, no abdominal bruits or hernia , no rebound or guarding.   Extremities: No lower extremity edema. No clubbing or deformities. Neuro: Alert and oriented x 4   Skin: Warm and dry, no jaundice.   Psych: Alert and cooperative, normal mood and affect.  Labs:  Lab Results  Component Value Date   CREATININE 2.62 (H) 05/11/2021   BUN 44 (H) 05/11/2021   NA 134 (L) 05/11/2021   K 3.9 05/11/2021   CL 102 05/11/2021   CO2 24 05/11/2021   Lab Results  Component Value Date   ALT 16 08/12/2019   AST 19 08/12/2019   ALKPHOS 40 08/12/2019   BILITOT 0.5 08/12/2019   Lab Results  Component Value Date   WBC 9.1 06/22/2021   HGB 10.7 (L) 06/22/2021   HCT 32.9 (L) 06/22/2021   MCV 97.1 06/22/2021   PLT 280 06/22/2021   Lab Results  Component Value Date   IRON 51 06/22/2021   TIBC 321 06/22/2021   FERRITIN 177 06/22/2021   Lab Results  Component Value Date   IRSWNIOE70 350 06/01/2020   Lab Results  Component Value Date   FOLATE 21.8 06/01/2020     Imaging Studies: No results found.   Assessment:  Anemia: Mixture of anemia of chronic disease and IDA.  Followed by hematology, received iron infusions in September.  His hemoglobin has been stable.  Typically in the upper 10 to lower 11 range.  Initial decline in hemoglobin in August 2021 contributed to gastric ulcers.  He will continue to follow with hematology.  If he requires more frequent iron infusions or his hemoglobin drops from his baseline again, he may need small bowel capsule endoscopy versus updated colonoscopy.  Gastric ulcer: Due to Aleve.  EGD  in December 2021 with healed ulcer, no additional findings.  Patient denies having history of heartburn or indigestion on a regular basis.  He would like to come off of PPI therapy.  We will have him stop pantoprazole and monitor for symptoms.  Continue to avoid NSAIDs.  Constipation: Mild in nature.  Managed with prune juice.  If needed could add MiraLAX 17 g daily as needed.  Family history of colon cancer: Patient is due for next colonoscopy in December 2024.     Plan: Avoid NSAIDS. Stop pantoprazole.  Continue to monitor H/H and iron/ferritin. If needs for iron infusions become more frequent or Hgb drops from baseline, then he will let us know as additional work up would be needed.  Discussed with patient, he requests seeing [place] in 2 years prior to colonoscopy and follow with hematology for anemia. He will call us if any questions or concerns.

## 2021-09-22 NOTE — Patient Instructions (Signed)
As discussed today, you can stop pantoprazole for now. If you have any recurrent abdominal pain, acid reflux, heartburn OR worsening anemia, then you will need to restart medication at least once per day. Please let us know if you have worsening anemia or require more frequent iron infusions. This may indicate need to have small bowel capsule study or even update your colonoscopy sooner.  For constipation, you can continue prune juice. Miralax one capful daily as needed is also an option. We will see you back in 2 years and schedule your next colonoscopy at that time.

## 2021-09-28 ENCOUNTER — Other Ambulatory Visit (HOSPITAL_COMMUNITY)
Admission: RE | Admit: 2021-09-28 | Discharge: 2021-09-28 | Disposition: A | Discharge status: Home / Self Care | Payer: 59 | Source: Ambulatory Visit | Attending: Nephrology | Admitting: Nephrology

## 2021-09-28 DIAGNOSIS — D509 Iron deficiency anemia, unspecified: Secondary | ICD-10-CM | POA: Insufficient documentation

## 2021-09-28 DIAGNOSIS — D649 Anemia, unspecified: Secondary | ICD-10-CM | POA: Insufficient documentation

## 2021-09-28 DIAGNOSIS — I5032 Chronic diastolic (congestive) heart failure: Secondary | ICD-10-CM | POA: Diagnosis present

## 2021-09-28 DIAGNOSIS — E1122 Type 2 diabetes mellitus with diabetic chronic kidney disease: Secondary | ICD-10-CM | POA: Diagnosis present

## 2021-09-28 DIAGNOSIS — N189 Chronic kidney disease, unspecified: Secondary | ICD-10-CM | POA: Diagnosis present

## 2021-09-28 DIAGNOSIS — I129 Hypertensive chronic kidney disease with stage 1 through stage 4 chronic kidney disease, or unspecified chronic kidney disease: Secondary | ICD-10-CM | POA: Diagnosis present

## 2021-09-28 DIAGNOSIS — D631 Anemia in chronic kidney disease: Secondary | ICD-10-CM | POA: Diagnosis present

## 2021-09-28 DIAGNOSIS — R808 Other proteinuria: Secondary | ICD-10-CM | POA: Diagnosis present

## 2021-09-28 LAB — IRON AND TIBC
Iron: 74 ug/dL (ref 45–182)
Iron: 75 ug/dL (ref 45–182)
Saturation Ratios: 21 % (ref 17.9–39.5)
Saturation Ratios: 22 % (ref 17.9–39.5)
TIBC: 345 ug/dL (ref 250–450)
TIBC: 347 ug/dL (ref 250–450)
UIBC: 271 ug/dL
UIBC: 272 ug/dL

## 2021-09-28 LAB — CBC WITH DIFFERENTIAL/PLATELET
Abs Immature Granulocytes: 0.15 10*3/uL — ABNORMAL HIGH (ref 0.00–0.07)
Basophils Absolute: 0.1 10*3/uL (ref 0.0–0.1)
Basophils Relative: 1 %
Eosinophils Absolute: 0.2 10*3/uL (ref 0.0–0.5)
Eosinophils Relative: 3 %
HCT: 34.7 % — ABNORMAL LOW (ref 39.0–52.0)
Hemoglobin: 11.6 g/dL — ABNORMAL LOW (ref 13.0–17.0)
Immature Granulocytes: 2 %
Lymphocytes Relative: 19 %
Lymphs Abs: 1.7 10*3/uL (ref 0.7–4.0)
MCH: 31.9 pg (ref 26.0–34.0)
MCHC: 33.4 g/dL (ref 30.0–36.0)
MCV: 95.3 fL (ref 80.0–100.0)
Monocytes Absolute: 0.7 10*3/uL (ref 0.1–1.0)
Monocytes Relative: 8 %
Neutro Abs: 6 10*3/uL (ref 1.7–7.7)
Neutrophils Relative %: 67 %
Platelets: 281 10*3/uL (ref 150–400)
RBC: 3.64 MIL/uL — ABNORMAL LOW (ref 4.22–5.81)
RDW: 12.8 % (ref 11.5–15.5)
WBC: 8.8 10*3/uL (ref 4.0–10.5)
nRBC: 0 % (ref 0.0–0.2)

## 2021-09-28 LAB — RENAL FUNCTION PANEL
Albumin: 4 g/dL (ref 3.5–5.0)
Anion gap: 10 (ref 5–15)
BUN: 45 mg/dL — ABNORMAL HIGH (ref 8–23)
CO2: 21 mmol/L — ABNORMAL LOW (ref 22–32)
Calcium: 10 mg/dL (ref 8.9–10.3)
Chloride: 104 mmol/L (ref 98–111)
Creatinine, Ser: 2.77 mg/dL — ABNORMAL HIGH (ref 0.61–1.24)
GFR, Estimated: 25 mL/min — ABNORMAL LOW (ref >60)
Glucose, Bld: 172 mg/dL — ABNORMAL HIGH (ref 70–99)
Phosphorus: 2.8 mg/dL (ref 2.5–4.6)
Potassium: 4.2 mmol/L (ref 3.5–5.1)
Sodium: 135 mmol/L (ref 135–145)

## 2021-09-28 LAB — PROTEIN / CREATININE RATIO, URINE
Creatinine, Urine: 51.04 mg/dL
Protein Creatinine Ratio: 0.33 mg/mg{Cre} — ABNORMAL HIGH (ref 0.00–0.15)
Total Protein, Urine: 17 mg/dL

## 2021-09-28 LAB — FERRITIN
Ferritin: 345 ng/mL — ABNORMAL HIGH (ref 24–336)
Ferritin: 378 ng/mL — ABNORMAL HIGH (ref 24–336)

## 2021-09-28 LAB — VITAMIN D 25 HYDROXY (VIT D DEFICIENCY, FRACTURES): Vit D, 25-Hydroxy: 33.27 ng/mL (ref 30–100)

## 2021-09-28 LAB — RETICULOCYTES
Immature Retic Fract: 12.5 % (ref 2.3–15.9)
RBC.: 3.67 MIL/uL — ABNORMAL LOW (ref 4.22–5.81)
Retic Count, Absolute: 74.1 10*3/uL (ref 19.0–186.0)
Retic Ct Pct: 2 % (ref 0.4–3.1)

## 2021-09-28 LAB — LACTATE DEHYDROGENASE: LDH: 138 U/L (ref 98–192)

## 2021-09-29 LAB — PTH, INTACT AND CALCIUM
Calcium, Total (PTH): 10.3 mg/dL — ABNORMAL HIGH (ref 8.6–10.2)
PTH: 25 pg/mL (ref 15–65)

## 2021-10-29 ENCOUNTER — Other Ambulatory Visit (HOSPITAL_COMMUNITY): Payer: 59

## 2021-11-02 ENCOUNTER — Other Ambulatory Visit: Payer: Self-pay | Admitting: Gastroenterology

## 2021-11-03 NOTE — Progress Notes (Signed)
Bryan Atkinson, [place] [address]   CLINIC:  Medical Oncology/Hematology  PCP:  Everardo Beals, NP [place] [address] [phone]   REASON FOR VISIT:  Follow-up for iron deficiency anemia  CURRENT THERAPY: Daily iron tablets with intermittent IV iron  INTERVAL HISTORY:  Bryan Atkinson 66 y.o. male returns for routine follow-up of his iron deficiency anemia.  He was last seen by Dr. Delton Coombes on 06/29/2021.  His last IV iron infusion was 07/16/2021.  At today's visit, he reports feeling fairly well.  No recent hospitalizations, surgeries, or changes in baseline health status.  He continues to take iron tablets twice daily, and is experiencing some constipation from this.  He denies any bleeding such as hematemesis, hematochezia, melena, or epistaxis.  He reports that his energy is 40%, which is actually improved from his usual baseline fatigue.  He has felt some better energy after his IV iron infusions in September.  He denies any pica, headaches, chest pain, dyspnea on exertion, lightheadedness, or syncope.  He does have some occasional leg cramps.  He has 40% energy and 100% appetite. He endorses that he is maintaining a stable weight.   REVIEW OF SYSTEMS:  Review of Systems  Constitutional:  Positive for fatigue (chronic). Negative for appetite change, chills, diaphoresis, fever and unexpected weight change.  HENT:   Negative for lump/mass and nosebleeds.   Eyes:  Negative for eye problems.  Respiratory:  Positive for cough. Negative for hemoptysis and shortness of breath.   Cardiovascular:  Negative for chest pain, leg swelling and palpitations.  Gastrointestinal:  Positive for constipation. Negative for abdominal pain, blood in stool, diarrhea, nausea and vomiting.  Genitourinary:  Negative for hematuria.   Skin: Negative.   Neurological:  Negative for dizziness, headaches and light-headedness.  Hematological:   Does not bruise/bleed easily.     PAST MEDICAL/SURGICAL HISTORY:  Past Medical History:  Diagnosis Date   Arthritis    Diabetes mellitus without complication ([place])    Gout    History of kidney stones    Hyperlipidemia    Hypertension    Past Surgical History:  Procedure Laterality Date   BIOPSY  05/25/2020   Procedure: BIOPSY;  Surgeon: Daneil Dolin, MD;  [facility];  Service: Endoscopy;;   CATARACT EXTRACTION W/PHACO Right 05/13/2019   Procedure: CATARACT EXTRACTION PHACO AND INTRAOCULAR LENS PLACEMENT ([place]);  Surgeon: Baruch Goldmann, MD;  Location: [facility];  Service: Ophthalmology;  Laterality: Right;  CDE: 10.31   CATARACT EXTRACTION W/PHACO Left 05/27/2019   Procedure: CATARACT EXTRACTION PHACO AND INTRAOCULAR LENS PLACEMENT ([facility]);  Surgeon: Baruch Goldmann, MD;  Location: [facility];  Service: Ophthalmology;  Laterality: Left;  CDE: 6.49   COLONOSCOPY     times two 2002 ,2007   COLONOSCOPY N/A 10/07/2013   Procedure: COLONOSCOPY;  Surgeon: Daneil Dolin, MD;  [facility];  Service: Endoscopy;  Laterality: N/A;  8:30 AM   COLONOSCOPY N/A 10/05/2018   Dr. Gala Romney: Diverticulosis.  Next colonoscopy 5 years due to first-degree relative with colon cancer   ESOPHAGOGASTRODUODENOSCOPY (EGD) WITH PROPOFOL N/A 05/25/2020   [name]: 2 gastric ulcers.  No H. pylori.  Likely NSAID related.  Repeat EGD in 3 months   ESOPHAGOGASTRODUODENOSCOPY (EGD) WITH PROPOFOL N/A 09/17/2020   Procedure: ESOPHAGOGASTRODUODENOSCOPY (EGD) WITH PROPOFOL;  Surgeon: Daneil Dolin, MD;  [facility];  Service: Endoscopy;  Laterality: N/A;  10:00am   JOINT REPLACEMENT  KNEE ARTHROPLASTY Left 06/27/2019   Procedure: COMPUTER ASSISTED TOTAL KNEE ARTHROPLASTY;  Surgeon: Rod Can, MD;  Location: [facility];  Service: Orthopedics;  Laterality: Left;   KNEE ARTHROPLASTY Right 08/15/2019   Procedure: COMPUTER ASSISTED TOTAL KNEE ARTHROPLASTY;  Surgeon: Rod Can, MD;  Location: [facility];  Service: Orthopedics;  Laterality: Right;   LEG SURGERY Right early 43's     SOCIAL HISTORY:  Social History   Socioeconomic History   Marital status: Married    Spouse name: Not on file   Number of children: 2   Years of education: Not on file   Highest education level: Not on file  Occupational History   Occupation: Truck Geophysicist/field seismologist    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  3 grandchildren. Married for 34 years   Social Determinants of Radio broadcast assistant Strain: Not on file  Food Insecurity: Not on file  Transportation Needs: Not on file  Physical Activity: Not on file  Stress: Not on file  Social Connections: Not on file  Intimate Partner Violence: Not on file    FAMILY HISTORY:  Family History  Problem Relation Age of Onset   Colon cancer Mother        She died from her malignancy, 17   Lung cancer Father        Died from lung cancer, age 4   Emphysema Brother        Died at 56 from lung disease   COPD Brother    Dementia Sister    Healthy Son    Healthy Daughter     CURRENT MEDICATIONS:  Outpatient Encounter Medications as of 11/04/2021  Medication Sig   acetaminophen (TYLENOL) 500 MG tablet Take 500 mg by mouth every 6 (six) hours as needed for mild pain.   allopurinol (ZYLOPRIM) 300 MG tablet Take 300 mg by mouth daily.   Apple Cider Vinegar 500 MG TABS Take 500 mg by mouth daily.    aspirin EC 81 MG tablet Take 81 mg by mouth daily. Swallow whole.   carvedilol (COREG) 25 MG tablet Take 25 mg by mouth 2 (two) times daily.   Co-Enzyme Q10 100 MG CAPS Take 100 mg by mouth daily.    Cranberry 500 MG TABS Take 500 mg  by mouth daily.   fenofibrate (TRICOR) 145 MG tablet Take 145 mg by mouth daily.   ferrous sulfate 325 (65 FE) MG tablet Take 325 mg by mouth daily with breakfast.   Flaxseed, Linseed, (FLAXSEED OIL) 1000 MG CAPS Take 1,000 mg by mouth daily.   furosemide (LASIX) 20 MG tablet furosemide 20 mg tablet  Take 1 tablet twice a day by oral route.   glipiZIDE (GLUCOTROL XL) 10 MG 24 hr tablet Take 10 mg by mouth daily as needed (If blood sugar is > 130).    glucose blood test strip 1 each by Other route as needed for other. Use as instructed   hydrALAZINE (APRESOLINE) 100 MG tablet Take 100 mg by mouth 3 (three) times daily.   Krill Oil 500 MG CAPS  Take 500 mg by mouth 2 (two) times a day.   methocarbamol (ROBAXIN) 500 MG tablet Take 500 mg by mouth 2 (two) times daily as needed.   Multiple Vitamin (MULTIVITAMIN) tablet Take 1 tablet by mouth daily.   Niacin CR 1000 MG TBCR niacin ER 1,000 mg tablet,extended release 24 hr   pantoprazole (PROTONIX) 40 MG tablet TAKE 1 TABLET (40 MG TOTAL) BY MOUTH 2 (TWO) TIMES DAILY BEFORE A MEAL.   pravastatin (PRAVACHOL) 40 MG tablet Take 40 mg by mouth at bedtime.    traZODone (DESYREL) 50 MG tablet Take 50 mg by mouth at bedtime.    triamcinolone cream (KENALOG) 0.1 % Apply 1 application topically 2 (two) times daily as needed (rash).    Turmeric 500 MG CAPS Take 500 mg by mouth daily.   No facility-administered encounter medications on file as of 11/04/2021.    ALLERGIES:  No Known Allergies   PHYSICAL EXAM:  ECOG PERFORMANCE STATUS: 1 - Symptomatic but completely ambulatory  There were no vitals filed for this visit. There were no vitals filed for this visit. Physical Exam Constitutional:      Appearance: Normal appearance. He is obese.  HENT:     Head: Normocephalic and atraumatic.     Mouth/Throat:     Mouth: Mucous membranes are moist.  Eyes:     Extraocular Movements: Extraocular movements intact.     Pupils: Pupils are equal, round, and  reactive to light.  Cardiovascular:     Rate and Rhythm: Normal rate and regular rhythm.     Pulses: Normal pulses.     Heart sounds: Normal heart sounds.  Pulmonary:     Effort: Pulmonary effort is normal.     Breath sounds: Normal breath sounds.  Abdominal:     General: Bowel sounds are normal.     Palpations: Abdomen is soft.     Tenderness: There is no abdominal tenderness.  Musculoskeletal:        General: No swelling.     Right lower leg: No edema.     Left lower leg: No edema.  Lymphadenopathy:     Cervical: No cervical adenopathy.  Skin:    General: Skin is warm and dry.  Neurological:     General: No focal deficit present.     Mental Status: He is alert and oriented to person, place, and time.  Psychiatric:        Mood and Affect: Mood normal.        Behavior: Behavior normal.     LABORATORY DATA:  I have reviewed the labs as listed.  CBC    Component Value Date/Time   WBC 8.8 09/28/2021 1002   RBC 3.67 (L) 09/28/2021 1002   RBC 3.64 (L) 09/28/2021 1002   HGB 11.6 (L) 09/28/2021 1002   HCT 34.7 (L) 09/28/2021 1002   PLT 281 09/28/2021 1002   MCV 95.3 09/28/2021 1002   MCH 31.9 09/28/2021 1002   MCHC 33.4 09/28/2021 1002   RDW 12.8 09/28/2021 1002   LYMPHSABS 1.7 09/28/2021 1002   MONOABS 0.7 09/28/2021 1002   EOSABS 0.2 09/28/2021 1002   BASOSABS 0.1 09/28/2021 1002   CMP Latest Ref Rng & Units 09/28/2021 09/28/2021 05/11/2021  Glucose 70 - 99 mg/dL 172(H) - 163(H)  BUN 8 - 23 mg/dL 45(H) - 44(H)  Creatinine 0.61 - 1.24 mg/dL 2.77(H) - 2.62(H)  Sodium 135 - 145 mmol/L 135 - 134(L)  Potassium 3.5 - 5.1 mmol/L 4.2 - 3.9  Chloride  98 - 111 mmol/L 104 - 102  CO2 22 - 32 mmol/L 21(L) - 24  Calcium 8.9 - 10.3 mg/dL 10.0 10.3(H) 8.8(L)  Total Protein 6.5 - 8.1 g/dL - - -  Total Bilirubin 0.3 - 1.2 mg/dL - - -  Alkaline Phos 38 - 126 U/L - - -  AST 15 - 41 U/L - - -  ALT 0 - 44 U/L - - -    DIAGNOSTIC IMAGING:  I have independently reviewed the  relevant imaging and discussed with the patient.  ASSESSMENT & PLAN: 1.  Normocytic anemia: - Combination anemia from CKD and relative iron deficiency.  He also had a history of bleeding gastric ulcer which is healed now. - EGD on 05/25/2020 showed normal esophagus, small hiatal hernia, normal duodenum.  He had 2 gastric ulcers, the largest measuring 1.75 cm deep benign-appearing prepyloric antral ulcer with no bleeding stigmata.   - Additional laboratory work-up of anemia on 06/01/2020 showed SPEP was negative.  B12, methylmalonic acid and copper levels were normal.  LDH was normal.  Free kappa light chains are 53.6, lambda light chain 62.9 and ratio of 0.85.  Immunofixation was negative. - He is taking iron tablets twice daily and experienced some constipation - He requires intermittent IV iron, most recently with Venofer on 07/16/2021 -No melena, epistaxis, or hematochezia -His chronic fatigue is slightly improved after IV iron - Most recent labs (09/28/2021): Hgb 11.6, ferritin 378, iron saturation 22 %.  Reticulocytes and LDH normal.  Creatinine 2.77/GFR 25 - PLAN: No indication for IV iron at this time. - Recommend changing iron tablets to ONCE DAILY for better absorption and decrease side effects - We will consider ESA if hemoglobin drops to less than 10 despite adequate iron - Repeat labs and RTC in 4 months  2.  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 - He is non-smoker.  Latest CBC shows white count is 8.8 with normal differential. - No B symptoms  3.  CKD stage IV - Patient follows with Dr. Theador Hawthorne.   4.  Social/family history: - He is a non-smoker.  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 - Father had lung cancer and mother had metastatic colon cancer.   PLAN SUMMARY & DISPOSITION: Labs in 4 months Phone visit after labs   All questions were answered. The patient  knows to call the clinic with any problems, questions or concerns.  Medical decision making: Low  Time spent on visit: I spent 15 minutes counseling the patient face to face. The total time spent in the appointment was 20 minutes and more than 50% was on counseling.   [name], PA-C  11/04/2021 1:26 PM

## 2021-11-04 ENCOUNTER — Other Ambulatory Visit: Payer: Self-pay

## 2021-11-04 ENCOUNTER — Inpatient Hospital Stay (HOSPITAL_COMMUNITY): Payer: 59 | Attending: Physician Assistant | Admitting: Physician Assistant

## 2021-11-04 VITALS — BP 162/97 | HR 99 | Temp 98.6°F | Resp 18 | Ht 69.0 in | Wt 240.1 lb

## 2021-11-04 DIAGNOSIS — N184 Chronic kidney disease, stage 4 (severe): Secondary | ICD-10-CM | POA: Insufficient documentation

## 2021-11-04 DIAGNOSIS — N1832 Chronic kidney disease, stage 3b: Secondary | ICD-10-CM

## 2021-11-04 DIAGNOSIS — D509 Iron deficiency anemia, unspecified: Secondary | ICD-10-CM | POA: Diagnosis not present

## 2021-11-04 DIAGNOSIS — D631 Anemia in chronic kidney disease: Secondary | ICD-10-CM | POA: Diagnosis present

## 2021-11-04 NOTE — Patient Instructions (Signed)
[place] at [facility] Discharge Instructions  You were seen today by Tarri Abernethy PA-C for your anemia and iron deficiency.  Your blood and iron levels are looking better.  You do not need any IV iron at this time.      LABS: Return in 4 months for repeat labs   OTHER TESTS: None  MEDICATIONS: DECREASE your iron pill to ONCE daily.  It is best absorbed when taken in the morning with a glass of orange juice.  FOLLOW-UP APPOINTMENT: Phone visit in 4 months, after labs   Thank you for choosing [place] at [facility] to provide your oncology and hematology care.  To afford each patient quality time with our provider, please arrive at least 15 minutes before your scheduled appointment time.   If you have a lab appointment with the [facility] please come in thru the Main Entrance and check in at the main information desk.  You need to re-schedule your appointment should you arrive 10 or more minutes late.  We strive to give you quality time with our providers, and arriving late affects you and other patients whose appointments are after yours.  Also, if you no show three or more times for appointments you may be dismissed from the clinic at the providers discretion.     Again, thank you for choosing [facility].  Our hope is that these requests will decrease the amount of time that you wait before being seen by our physicians.       _____________________________________________________________  Should you have questions after your visit to [facility], please contact our office at [phone] and follow the prompts.  Our office hours are 8:00 a.m. and 4:30 p.m. Monday - Friday.  Please note that voicemails left after 4:00 p.m. may not be returned until the following business day.  We are closed weekends and major holidays.  You do have access to a nurse 24-7, just call the main number to the clinic [phone]  and do not press any options, hold on the line and a nurse will answer the phone.    For prescription refill requests, have your pharmacy contact our office and allow 72 hours.    Due to Covid, you will need to wear a mask upon entering the hospital. If you do not have a mask, a mask will be given to you at the Main Entrance upon arrival. For doctor visits, patients may have 1 support person age 64 or older with them. For treatment visits, patients can not have anyone with them due to social distancing guidelines and our immunocompromised population.

## 2021-11-05 ENCOUNTER — Ambulatory Visit (HOSPITAL_COMMUNITY): Payer: 59 | Admitting: Physician Assistant

## 2021-12-03 ENCOUNTER — Other Ambulatory Visit (HOSPITAL_COMMUNITY)
Admission: RE | Admit: 2021-12-03 | Discharge: 2021-12-03 | Disposition: A | Discharge status: Home / Self Care | Payer: 59 | Source: Ambulatory Visit | Attending: Nephrology | Admitting: Nephrology

## 2021-12-03 DIAGNOSIS — R739 Hyperglycemia, unspecified: Secondary | ICD-10-CM | POA: Diagnosis present

## 2021-12-03 DIAGNOSIS — E1129 Type 2 diabetes mellitus with other diabetic kidney complication: Secondary | ICD-10-CM | POA: Diagnosis present

## 2021-12-03 DIAGNOSIS — R809 Proteinuria, unspecified: Secondary | ICD-10-CM | POA: Insufficient documentation

## 2021-12-03 DIAGNOSIS — I5032 Chronic diastolic (congestive) heart failure: Secondary | ICD-10-CM | POA: Diagnosis present

## 2021-12-03 DIAGNOSIS — E1122 Type 2 diabetes mellitus with diabetic chronic kidney disease: Secondary | ICD-10-CM | POA: Diagnosis not present

## 2021-12-03 LAB — CBC
HCT: 34.1 % — ABNORMAL LOW (ref 39.0–52.0)
Hemoglobin: 11.3 g/dL — ABNORMAL LOW (ref 13.0–17.0)
MCH: 32.2 pg (ref 26.0–34.0)
MCHC: 33.1 g/dL (ref 30.0–36.0)
MCV: 97.2 fL (ref 80.0–100.0)
Platelets: 300 10*3/uL (ref 150–400)
RBC: 3.51 MIL/uL — ABNORMAL LOW (ref 4.22–5.81)
RDW: 13.3 % (ref 11.5–15.5)
WBC: 8.8 10*3/uL (ref 4.0–10.5)
nRBC: 0 % (ref 0.0–0.2)

## 2021-12-03 LAB — RENAL FUNCTION PANEL
Albumin: 3.9 g/dL (ref 3.5–5.0)
Anion gap: 8 (ref 5–15)
BUN: 58 mg/dL — ABNORMAL HIGH (ref 8–23)
CO2: 22 mmol/L (ref 22–32)
Calcium: 9.3 mg/dL (ref 8.9–10.3)
Chloride: 108 mmol/L (ref 98–111)
Creatinine, Ser: 3.01 mg/dL — ABNORMAL HIGH (ref 0.61–1.24)
GFR, Estimated: 23 mL/min — ABNORMAL LOW (ref >60)
Glucose, Bld: 164 mg/dL — ABNORMAL HIGH (ref 70–99)
Phosphorus: 2.8 mg/dL (ref 2.5–4.6)
Potassium: 4 mmol/L (ref 3.5–5.1)
Sodium: 138 mmol/L (ref 135–145)

## 2021-12-03 LAB — PROTEIN / CREATININE RATIO, URINE
Creatinine, Urine: 75.56 mg/dL
Protein Creatinine Ratio: 0.28 mg/mg{Cre} — ABNORMAL HIGH (ref 0.00–0.15)
Total Protein, Urine: 21 mg/dL

## 2022-02-04 ENCOUNTER — Other Ambulatory Visit (HOSPITAL_COMMUNITY)
Admission: RE | Admit: 2022-02-04 | Discharge: 2022-02-04 | Disposition: A | Discharge status: Home / Self Care | Payer: 59 | Source: Ambulatory Visit | Attending: Nephrology | Admitting: Nephrology

## 2022-02-04 DIAGNOSIS — I129 Hypertensive chronic kidney disease with stage 1 through stage 4 chronic kidney disease, or unspecified chronic kidney disease: Secondary | ICD-10-CM | POA: Insufficient documentation

## 2022-02-04 DIAGNOSIS — N189 Chronic kidney disease, unspecified: Secondary | ICD-10-CM | POA: Diagnosis present

## 2022-02-04 DIAGNOSIS — I5032 Chronic diastolic (congestive) heart failure: Secondary | ICD-10-CM | POA: Insufficient documentation

## 2022-02-04 DIAGNOSIS — R809 Proteinuria, unspecified: Secondary | ICD-10-CM | POA: Diagnosis present

## 2022-02-04 DIAGNOSIS — D631 Anemia in chronic kidney disease: Secondary | ICD-10-CM | POA: Insufficient documentation

## 2022-02-04 DIAGNOSIS — E1129 Type 2 diabetes mellitus with other diabetic kidney complication: Secondary | ICD-10-CM | POA: Diagnosis present

## 2022-02-04 DIAGNOSIS — E1122 Type 2 diabetes mellitus with diabetic chronic kidney disease: Secondary | ICD-10-CM | POA: Diagnosis present

## 2022-02-04 LAB — RENAL FUNCTION PANEL
Albumin: 3.9 g/dL (ref 3.5–5.0)
Anion gap: 8 (ref 5–15)
BUN: 46 mg/dL — ABNORMAL HIGH (ref 8–23)
CO2: 23 mmol/L (ref 22–32)
Calcium: 9.5 mg/dL (ref 8.9–10.3)
Chloride: 108 mmol/L (ref 98–111)
Creatinine, Ser: 2.38 mg/dL — ABNORMAL HIGH (ref 0.61–1.24)
GFR, Estimated: 30 mL/min — ABNORMAL LOW (ref >60)
Glucose, Bld: 93 mg/dL (ref 70–99)
Phosphorus: 2.5 mg/dL (ref 2.5–4.6)
Potassium: 4 mmol/L (ref 3.5–5.1)
Sodium: 139 mmol/L (ref 135–145)

## 2022-02-04 LAB — PROTEIN / CREATININE RATIO, URINE
Creatinine, Urine: 57.15 mg/dL
Protein Creatinine Ratio: 0.49 mg/mg{Cre} — ABNORMAL HIGH (ref 0.00–0.15)
Total Protein, Urine: 28 mg/dL

## 2022-02-04 LAB — CBC
HCT: 32.1 % — ABNORMAL LOW (ref 39.0–52.0)
Hemoglobin: 10.6 g/dL — ABNORMAL LOW (ref 13.0–17.0)
MCH: 31.5 pg (ref 26.0–34.0)
MCHC: 33 g/dL (ref 30.0–36.0)
MCV: 95.3 fL (ref 80.0–100.0)
Platelets: 308 10*3/uL (ref 150–400)
RBC: 3.37 MIL/uL — ABNORMAL LOW (ref 4.22–5.81)
RDW: 13.6 % (ref 11.5–15.5)
WBC: 7.9 10*3/uL (ref 4.0–10.5)
nRBC: 0 % (ref 0.0–0.2)

## 2022-03-04 ENCOUNTER — Inpatient Hospital Stay (HOSPITAL_COMMUNITY): Payer: 59 | Attending: Hematology

## 2022-03-04 DIAGNOSIS — D631 Anemia in chronic kidney disease: Secondary | ICD-10-CM | POA: Diagnosis present

## 2022-03-04 DIAGNOSIS — N184 Chronic kidney disease, stage 4 (severe): Secondary | ICD-10-CM | POA: Insufficient documentation

## 2022-03-04 DIAGNOSIS — D509 Iron deficiency anemia, unspecified: Secondary | ICD-10-CM | POA: Diagnosis not present

## 2022-03-04 LAB — CBC WITH DIFFERENTIAL/PLATELET
Abs Immature Granulocytes: 0.08 10*3/uL — ABNORMAL HIGH (ref 0.00–0.07)
Basophils Absolute: 0.1 10*3/uL (ref 0.0–0.1)
Basophils Relative: 1 %
Eosinophils Absolute: 0.2 10*3/uL (ref 0.0–0.5)
Eosinophils Relative: 2 %
HCT: 32.1 % — ABNORMAL LOW (ref 39.0–52.0)
Hemoglobin: 10.7 g/dL — ABNORMAL LOW (ref 13.0–17.0)
Immature Granulocytes: 1 %
Lymphocytes Relative: 22 %
Lymphs Abs: 2.1 10*3/uL (ref 0.7–4.0)
MCH: 31.7 pg (ref 26.0–34.0)
MCHC: 33.3 g/dL (ref 30.0–36.0)
MCV: 95 fL (ref 80.0–100.0)
Monocytes Absolute: 1.2 10*3/uL — ABNORMAL HIGH (ref 0.1–1.0)
Monocytes Relative: 12 %
Neutro Abs: 5.9 10*3/uL (ref 1.7–7.7)
Neutrophils Relative %: 62 %
Platelets: 294 10*3/uL (ref 150–400)
RBC: 3.38 MIL/uL — ABNORMAL LOW (ref 4.22–5.81)
RDW: 13.6 % (ref 11.5–15.5)
WBC: 9.4 10*3/uL (ref 4.0–10.5)
nRBC: 0 % (ref 0.0–0.2)

## 2022-03-04 LAB — COMPREHENSIVE METABOLIC PANEL
ALT: 26 U/L (ref 0–44)
AST: 31 U/L (ref 15–41)
Albumin: 4 g/dL (ref 3.5–5.0)
Alkaline Phosphatase: 53 U/L (ref 38–126)
Anion gap: 8 (ref 5–15)
BUN: 50 mg/dL — ABNORMAL HIGH (ref 8–23)
CO2: 23 mmol/L (ref 22–32)
Calcium: 9.5 mg/dL (ref 8.9–10.3)
Chloride: 109 mmol/L (ref 98–111)
Creatinine, Ser: 2.79 mg/dL — ABNORMAL HIGH (ref 0.61–1.24)
GFR, Estimated: 25 mL/min — ABNORMAL LOW (ref >60)
Glucose, Bld: 90 mg/dL (ref 70–99)
Potassium: 3.8 mmol/L (ref 3.5–5.1)
Sodium: 140 mmol/L (ref 135–145)
Total Bilirubin: 0.2 mg/dL — ABNORMAL LOW (ref 0.3–1.2)
Total Protein: 7 g/dL (ref 6.5–8.1)

## 2022-03-04 LAB — IRON AND TIBC
Iron: 50 ug/dL (ref 45–182)
Saturation Ratios: 15 % — ABNORMAL LOW (ref 17.9–39.5)
TIBC: 345 ug/dL (ref 250–450)
UIBC: 295 ug/dL

## 2022-03-04 LAB — FERRITIN: Ferritin: 294 ng/mL (ref 24–336)

## 2022-03-11 ENCOUNTER — Inpatient Hospital Stay (HOSPITAL_BASED_OUTPATIENT_CLINIC_OR_DEPARTMENT_OTHER): Payer: 59 | Admitting: Physician Assistant

## 2022-03-11 DIAGNOSIS — N1832 Chronic kidney disease, stage 3b: Secondary | ICD-10-CM

## 2022-03-11 DIAGNOSIS — D631 Anemia in chronic kidney disease: Secondary | ICD-10-CM

## 2022-03-11 DIAGNOSIS — D509 Iron deficiency anemia, unspecified: Secondary | ICD-10-CM | POA: Diagnosis not present

## 2022-03-11 NOTE — Progress Notes (Signed)
Virtual Visit via Telephone Note [facility]  I connected with Bryan Atkinson  on 03/11/22 at  1:24 PM by telephone and verified that I am speaking with the correct person using two identifiers.  Location: Patient: Home Provider: [facility]   I discussed the limitations, risks, security and privacy concerns of performing an evaluation and management service by telephone and the availability of in person appointments. I also discussed with the patient that there may be a patient responsible charge related to this service. The patient expressed understanding and agreed to proceed.  REASON FOR VISIT:  Follow-up for iron deficiency anemia   CURRENT THERAPY: Daily iron tablets with intermittent IV iron   INTERVAL HISTORY:  Mr. Bryan Ang.o. male returns for routine follow-up of his iron deficiency anemia.  He was last seen by Casey Burkitt PA-C on 11/04/2021.  His last IV iron infusion was 07/16/2021.   At today's visit, he reports feeling fairly well.   No recent hospitalizations, surgeries, or changes in baseline health status.  He is taking iron tablets once daily instead of twice, with improvement in his constipation. He has intermittent fatigue that "comes and goes.  He denies any pica, headaches, chest pain, dyspnea on exertion, lightheadedness, or syncope.  He does have some occasional leg cramps.  He denies any B symptoms such as fever, chills, night sweats, unintentional weight loss.   He has 70% energy and 100% appetite. He endorses that he is maintaining a stable weight.    OBSERVATIONS/OBJECTIVE: Review of Systems  Constitutional:  Positive for malaise/fatigue. Negative for chills, diaphoresis, fever and weight loss.  Respiratory:  Negative for cough and shortness of breath.   Cardiovascular:  Negative for chest pain and palpitations.  Gastrointestinal:  Negative for abdominal pain, blood in stool, melena, nausea and vomiting.   Musculoskeletal:  Positive for myalgias (intermittent leg cramps).  Neurological:  Negative for dizziness and headaches.    PHYSICAL EXAM (per limitations of virtual telephone visit): The patient is alert and oriented x 3, exhibiting adequate mentation, good mood, and ability to speak in full sentences and execute sound judgement.   ASSESSMENT & PLAN: 1.  Normocytic anemia: - Combination anemia from CKD and relative iron deficiency.  He also had a history of bleeding gastric ulcer which is healed now. - EGD on 05/25/2020 showed normal esophagus, small hiatal hernia, normal duodenum.  He had 2 gastric ulcers, the largest measuring 1.75 cm deep benign-appearing prepyloric antral ulcer with no bleeding stigmata.   - Additional laboratory work-up of anemia on 06/01/2020 showed SPEP was negative.  B12, methylmalonic acid and copper levels were normal.  LDH was normal.  Free kappa light chains are 53.6, lambda light chain 62.9 and ratio of 0.85.  Immunofixation was negative. - He is taking iron tablets once daily - He requires intermittent IV iron, most recently with Venofer on 07/16/2021 - No melena, epistaxis, or hematochezia   - His chronic fatigue is at baseline - Most recent labs (03/04/2022): Hgb 10.7/MCV 95.0, at baseline.  Ferritin 294, iron saturation 15%.  Creatinine 2.79/GFR 25, at baseline. - PLAN: No indication for IV iron at this time. - Continue iron tablets once daily - We will consider ESA if hemoglobin drops to less than 10 despite adequate iron - Repeat labs and RTC in 4 months (phone visit preferred) - will plan on in person visit at least once a year   2.  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 - He is non-smoker.  - No B symptoms  - Most recent CBC (03/04/2022) with normal WBC 9.4, mildly elevated monocytes 1.2   3.  CKD stage IV - Patient follows with Dr. Theador Hawthorne.    4.  Social/family history: - He is a non-smoker.  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 - Father had lung cancer and mother had metastatic colon cancer.   FOLLOW UP INSTRUCTIONS: Labs in 4 months Phone visit after labs    I discussed the assessment and treatment plan with the patient. The patient was provided an opportunity to ask questions and all were answered. The patient agreed with the plan and demonstrated an understanding of the instructions.   The patient was advised to call back or seek an in-person evaluation if the symptoms worsen or if the condition fails to improve as anticipated.  I provided 18 minutes of non-face-to-face time during this encounter.   [name], PA-C 03/11/2022 1:44 PM

## 2022-03-24 ENCOUNTER — Observation Stay (HOSPITAL_COMMUNITY)
Admission: EM | Admit: 2022-03-24 | Discharge: 2022-03-26 | Disposition: A | Discharge status: Home / Self Care | Payer: 59 | Attending: Internal Medicine | Admitting: Internal Medicine

## 2022-03-24 ENCOUNTER — Encounter (HOSPITAL_COMMUNITY): Payer: Self-pay

## 2022-03-24 ENCOUNTER — Other Ambulatory Visit: Payer: Self-pay

## 2022-03-24 DIAGNOSIS — K922 Gastrointestinal hemorrhage, unspecified: Secondary | ICD-10-CM | POA: Diagnosis not present

## 2022-03-24 DIAGNOSIS — Z79899 Other long term (current) drug therapy: Secondary | ICD-10-CM | POA: Insufficient documentation

## 2022-03-24 DIAGNOSIS — D509 Iron deficiency anemia, unspecified: Secondary | ICD-10-CM | POA: Diagnosis not present

## 2022-03-24 DIAGNOSIS — E669 Obesity, unspecified: Secondary | ICD-10-CM | POA: Insufficient documentation

## 2022-03-24 DIAGNOSIS — K449 Diaphragmatic hernia without obstruction or gangrene: Secondary | ICD-10-CM | POA: Insufficient documentation

## 2022-03-24 DIAGNOSIS — K219 Gastro-esophageal reflux disease without esophagitis: Secondary | ICD-10-CM | POA: Diagnosis present

## 2022-03-24 DIAGNOSIS — N1832 Chronic kidney disease, stage 3b: Secondary | ICD-10-CM | POA: Diagnosis present

## 2022-03-24 DIAGNOSIS — Z7982 Long term (current) use of aspirin: Secondary | ICD-10-CM | POA: Diagnosis not present

## 2022-03-24 DIAGNOSIS — D62 Acute posthemorrhagic anemia: Secondary | ICD-10-CM | POA: Diagnosis not present

## 2022-03-24 DIAGNOSIS — E1140 Type 2 diabetes mellitus with diabetic neuropathy, unspecified: Secondary | ICD-10-CM | POA: Diagnosis not present

## 2022-03-24 DIAGNOSIS — I129 Hypertensive chronic kidney disease with stage 1 through stage 4 chronic kidney disease, or unspecified chronic kidney disease: Secondary | ICD-10-CM | POA: Diagnosis not present

## 2022-03-24 DIAGNOSIS — Z7984 Long term (current) use of oral hypoglycemic drugs: Secondary | ICD-10-CM | POA: Diagnosis not present

## 2022-03-24 DIAGNOSIS — N189 Chronic kidney disease, unspecified: Secondary | ICD-10-CM

## 2022-03-24 DIAGNOSIS — Z6833 Body mass index (BMI) 33.0-33.9, adult: Secondary | ICD-10-CM | POA: Diagnosis not present

## 2022-03-24 DIAGNOSIS — Z96653 Presence of artificial knee joint, bilateral: Secondary | ICD-10-CM | POA: Insufficient documentation

## 2022-03-24 DIAGNOSIS — N179 Acute kidney failure, unspecified: Secondary | ICD-10-CM | POA: Insufficient documentation

## 2022-03-24 DIAGNOSIS — K259 Gastric ulcer, unspecified as acute or chronic, without hemorrhage or perforation: Principal | ICD-10-CM | POA: Insufficient documentation

## 2022-03-24 DIAGNOSIS — I10 Essential (primary) hypertension: Secondary | ICD-10-CM | POA: Diagnosis present

## 2022-03-24 DIAGNOSIS — E1121 Type 2 diabetes mellitus with diabetic nephropathy: Secondary | ICD-10-CM

## 2022-03-24 DIAGNOSIS — D649 Anemia, unspecified: Secondary | ICD-10-CM

## 2022-03-24 DIAGNOSIS — E1122 Type 2 diabetes mellitus with diabetic chronic kidney disease: Secondary | ICD-10-CM | POA: Insufficient documentation

## 2022-03-24 DIAGNOSIS — R799 Abnormal finding of blood chemistry, unspecified: Secondary | ICD-10-CM | POA: Diagnosis present

## 2022-03-24 LAB — CBC WITH DIFFERENTIAL/PLATELET
Abs Immature Granulocytes: 0.5 10*3/uL — ABNORMAL HIGH (ref 0.00–0.07)
Basophils Absolute: 0.1 10*3/uL (ref 0.0–0.1)
Basophils Relative: 1 %
Eosinophils Absolute: 0.2 10*3/uL (ref 0.0–0.5)
Eosinophils Relative: 1 %
HCT: 23.5 % — ABNORMAL LOW (ref 39.0–52.0)
Hemoglobin: 7.7 g/dL — ABNORMAL LOW (ref 13.0–17.0)
Immature Granulocytes: 4 %
Lymphocytes Relative: 18 %
Lymphs Abs: 2.1 10*3/uL (ref 0.7–4.0)
MCH: 31.7 pg (ref 26.0–34.0)
MCHC: 32.8 g/dL (ref 30.0–36.0)
MCV: 96.7 fL (ref 80.0–100.0)
Monocytes Absolute: 0.8 10*3/uL (ref 0.1–1.0)
Monocytes Relative: 7 %
Neutro Abs: 7.9 10*3/uL — ABNORMAL HIGH (ref 1.7–7.7)
Neutrophils Relative %: 69 %
Platelets: 295 10*3/uL (ref 150–400)
RBC: 2.43 MIL/uL — ABNORMAL LOW (ref 4.22–5.81)
RDW: 13.5 % (ref 11.5–15.5)
WBC: 11.5 10*3/uL — ABNORMAL HIGH (ref 4.0–10.5)
nRBC: 0 % (ref 0.0–0.2)

## 2022-03-24 LAB — PROTIME-INR
INR: 1 (ref 0.8–1.2)
Prothrombin Time: 13.5 seconds (ref 11.4–15.2)

## 2022-03-24 LAB — PREPARE RBC (CROSSMATCH)

## 2022-03-24 LAB — COMPREHENSIVE METABOLIC PANEL
ALT: 16 U/L (ref 0–44)
AST: 18 U/L (ref 15–41)
Albumin: 3.6 g/dL (ref 3.5–5.0)
Alkaline Phosphatase: 36 U/L — ABNORMAL LOW (ref 38–126)
Anion gap: 11 (ref 5–15)
BUN: 87 mg/dL — ABNORMAL HIGH (ref 8–23)
CO2: 23 mmol/L (ref 22–32)
Calcium: 9.7 mg/dL (ref 8.9–10.3)
Chloride: 100 mmol/L (ref 98–111)
Creatinine, Ser: 3.38 mg/dL — ABNORMAL HIGH (ref 0.61–1.24)
GFR, Estimated: 19 mL/min — ABNORMAL LOW (ref >60)
Glucose, Bld: 152 mg/dL — ABNORMAL HIGH (ref 70–99)
Potassium: 3.9 mmol/L (ref 3.5–5.1)
Sodium: 134 mmol/L — ABNORMAL LOW (ref 135–145)
Total Bilirubin: 0.6 mg/dL (ref 0.3–1.2)
Total Protein: 6.5 g/dL (ref 6.5–8.1)

## 2022-03-24 LAB — HEMOGLOBIN A1C
Hgb A1c MFr Bld: 5.3 % (ref 4.8–5.6)
Mean Plasma Glucose: 105.41 mg/dL

## 2022-03-24 LAB — CBG MONITORING, ED: Glucose-Capillary: 79 mg/dL (ref 70–99)

## 2022-03-24 LAB — GLUCOSE, CAPILLARY: Glucose-Capillary: 114 mg/dL — ABNORMAL HIGH (ref 70–99)

## 2022-03-24 LAB — HIV ANTIBODY (ROUTINE TESTING W REFLEX): HIV Screen 4th Generation wRfx: NONREACTIVE

## 2022-03-24 LAB — POC OCCULT BLOOD, ED: Fecal Occult Bld: POSITIVE — AB

## 2022-03-24 MED ORDER — SODIUM CHLORIDE 0.9 % IV SOLN: INTRAVENOUS | Status: AC

## 2022-03-24 MED ORDER — ONDANSETRON HCL 4 MG PO TABS: 4.0000 mg | ORAL_TABLET | Freq: Four times a day (QID) | ORAL | Status: DC | PRN

## 2022-03-24 MED ORDER — INSULIN ASPART 100 UNIT/ML IJ SOLN: 0.0000 [IU] | Freq: Every day | INTRAMUSCULAR | Status: DC

## 2022-03-24 MED ORDER — FUROSEMIDE 10 MG/ML IJ SOLN
20.0000 mg | Freq: Once | INTRAMUSCULAR | Status: AC
  Administered 2022-03-24: 20 mg via INTRAVENOUS
  Filled 2022-03-24: qty 2

## 2022-03-24 MED ORDER — ACETAMINOPHEN 325 MG PO TABS: 650.0000 mg | ORAL_TABLET | Freq: Four times a day (QID) | ORAL | Status: DC | PRN

## 2022-03-24 MED ORDER — SODIUM CHLORIDE 0.9 % IV BOLUS
500.0000 mL | Freq: Once | INTRAVENOUS | Status: AC
Start: 2022-03-24 — End: 2022-03-24
  Administered 2022-03-24: 500 mL via INTRAVENOUS

## 2022-03-24 MED ORDER — ONDANSETRON HCL 4 MG/2ML IJ SOLN: 4.0000 mg | Freq: Four times a day (QID) | INTRAMUSCULAR | Status: DC | PRN

## 2022-03-24 MED ORDER — PANTOPRAZOLE SODIUM 40 MG IV SOLR
40.0000 mg | Freq: Two times a day (BID) | INTRAVENOUS | Status: DC
  Administered 2022-03-24 – 2022-03-26 (×4): 40 mg via INTRAVENOUS
  Filled 2022-03-24 (×4): qty 10

## 2022-03-24 MED ORDER — SODIUM CHLORIDE 0.9% IV SOLUTION: Freq: Once | INTRAVENOUS | Status: AC

## 2022-03-24 MED ORDER — ACETAMINOPHEN 650 MG RE SUPP: 650.0000 mg | Freq: Four times a day (QID) | RECTAL | Status: DC | PRN

## 2022-03-24 MED ORDER — INSULIN ASPART 100 UNIT/ML IJ SOLN: 0.0000 [IU] | Freq: Three times a day (TID) | INTRAMUSCULAR | Status: DC

## 2022-03-24 MED ORDER — PANTOPRAZOLE SODIUM 40 MG IV SOLR
40.0000 mg | Freq: Once | INTRAVENOUS | Status: AC
  Administered 2022-03-24: 40 mg via INTRAVENOUS
  Filled 2022-03-24: qty 10

## 2022-03-24 MED ORDER — SODIUM CHLORIDE 0.9 % IV SOLN: INTRAVENOUS | Status: DC

## 2022-03-24 NOTE — H&P (Signed)
History and Physical    Patient: Bryan Atkinson FIE:[id number] DOB: 1955/11/14 DOA: 03/24/2022 DOS: the patient was seen and examined on 03/24/2022 PCP: Everardo Beals, NP  Patient coming from: Home  Chief Complaint:  Chief Complaint  Patient presents with   Abnormal Labs   HPI: Bryan Atkinson is a 66 y.o. male with medical history significant of chronic kidney disease stage IIIb, type 2 diabetes mellitus with nephropathy, hypertension, hyperlipidemia, gastroesophageal reflux disease and prior history of GI bleed.  Presented to the hospital secondary to dizziness/lightheadedness and also as instructed by his primary care doctor after noticing worsening renal function and decreased hemoglobin level.  Patient reports noticing some dark stools intermittently, but expressed that he is on chronic iron supplementation. No chest pain, no fever, no nausea, no hematemesis, no focal weakness, no dysuria, no hematuria, no focal neurologic deficits or sick contacts. In the ED work-up demonstrated creatinine 3.38 (baseline around 2.6-2.7 (with a GFR in the 20s to 30s).  Hemoglobin 7.7 with a prior level in the 9-10 range (patient with history of GI bleed in the past along with anemia of chronic kidney disease, receiving IV iron infusions and also oral iron supplementation).  GI service was consulted by EDP and [facility] contacted to place patient in the hospital for further evaluation and management.  Patient is hemodynamically stable.  Review of Systems: As mentioned in the history of present illness. All other systems reviewed and are negative. Past Medical History:  Diagnosis Date   Arthritis    Diabetes mellitus without complication ([place])    Gout    History of kidney stones    Hyperlipidemia    Hypertension    Past Surgical History:  Procedure Laterality Date   BIOPSY  05/25/2020   Procedure: BIOPSY;  Surgeon: Daneil Dolin, MD;  Location: [facility];  Service: Endoscopy;;    CATARACT EXTRACTION W/PHACO Right 05/13/2019   Procedure: CATARACT EXTRACTION PHACO AND INTRAOCULAR LENS PLACEMENT ([place]);  Surgeon: Baruch Goldmann, MD;  Location: [facility] OR[facility];  Service: Ophthalmology;  Laterality: Right;  CDE: 10.31   CATARACT EXTRACTION W/PHACO Left 05/27/2019   Procedure: CATARACT EXTRACTION PHACO AND INTRAOCULAR LENS PLACEMENT ([facility]);  Surgeon: Baruch Goldmann, MD;  Location: [facility];  Service: Ophthalmology;  Laterality: Left;  CDE: 6.49   COLONOSCOPY     times two 2002 ,2007   COLONOSCOPY N/A 10/07/2013   Procedure: COLONOSCOPY;  Surgeon: Daneil Dolin, MD;  Location: [facility];  Service: Endoscopy;  Laterality: N/A;  8:30 AM   COLONOSCOPY N/A 10/05/2018   Dr. Gala Romney: Diverticulosis.  Next colonoscopy 5 years due to first-degree relative with colon cancer   ESOPHAGOGASTRODUODENOSCOPY (EGD) WITH PROPOFOL N/A 05/25/2020   [name]: 2 gastric ulcers.  No H. pylori.  Likely NSAID related.  Repeat EGD in 3 months   ESOPHAGOGASTRODUODENOSCOPY (EGD) WITH PROPOFOL N/A 09/17/2020   Procedure: ESOPHAGOGASTRODUODENOSCOPY (EGD) WITH PROPOFOL;  Surgeon: Daneil Dolin, MD;  Location: [facility];  Service: Endoscopy;  Laterality: N/A;  10:00am   JOINT REPLACEMENT     KNEE ARTHROPLASTY Left 06/27/2019   Procedure: COMPUTER ASSISTED TOTAL KNEE ARTHROPLASTY;  Surgeon: Rod Can, MD;  Location: [facility];  Service: Orthopedics;  Laterality: Left;   KNEE ARTHROPLASTY Right 08/15/2019   Procedure: COMPUTER ASSISTED TOTAL KNEE ARTHROPLASTY;  Surgeon: Rod Can, MD;  Location: [facility];  Service: Orthopedics;  Laterality: Right;   LEG SURGERY Right early 23's   Social History:  reports that he has never smoked. He has  never used smokeless tobacco. He reports current alcohol use. He reports that he does not use drugs.  No Known Allergies  Family History  Problem Relation Age of Onset   Colon cancer Mother        She died from her malignancy, 24   Lung cancer Father        Died from lung  cancer, age 15   Emphysema Brother        Died at 73 from lung disease   COPD Brother    Dementia Sister    Healthy Son    Healthy Daughter     Prior to Admission medications   Medication Sig Start Date End Date Taking? Authorizing Provider  acetaminophen (TYLENOL) 500 MG tablet Take 500 mg by mouth every 6 (six) hours as needed for mild pain.   Yes [provider]  allopurinol (ZYLOPRIM) 300 MG tablet Take 300 mg by mouth daily.   Yes [provider]  amLODipine (NORVASC) 5 MG tablet Take 5 mg by mouth daily. 03/10/22  Yes [provider]  Apple Cider Vinegar 500 MG TABS Take 500 mg by mouth daily.    Yes [provider]  aspirin EC 81 MG tablet Take 81 mg by mouth daily. Swallow whole.   Yes [provider]  carvedilol (COREG) 25 MG tablet Take 25 mg by mouth 2 (two) times daily. 12/03/20  Yes [provider]  Co-Enzyme Q10 100 MG CAPS Take 100 mg by mouth daily.    Yes [provider]  Cranberry 500 MG TABS Take 500 mg by mouth daily.   Yes [provider]  FARXIGA 5 MG TABS tablet Take 5 mg by mouth every morning. 02/17/22  Yes [provider]  fenofibrate (TRICOR) 145 MG tablet Take 145 mg by mouth daily.   Yes [provider]  ferrous sulfate 325 (65 FE) MG tablet Take 325 mg by mouth daily with breakfast.   Yes [provider]  Flaxseed, Linseed, (FLAXSEED OIL) 1000 MG CAPS Take 1,000 mg by mouth daily.   Yes [provider]  furosemide (LASIX) 20 MG tablet Take 20 mg by mouth 2 (two) times daily.   Yes [provider]  glipiZIDE (GLUCOTROL XL) 10 MG 24 hr tablet Take 10 mg by mouth daily as needed (If blood sugar is > 130).  06/17/20  Yes [provider]  hydrALAZINE (APRESOLINE) 100 MG tablet Take 100 mg by mouth 3 (three) times daily. 06/20/21  Yes [provider]  Javier Docker Oil 500 MG CAPS Take 500 mg by mouth 2 (two) times a day.   Yes [provider]  losartan (COZAAR) 25 MG tablet Take 50 mg by mouth once. 10/29/21  Yes [provider]  Multiple Vitamin (MULTIVITAMIN) tablet Take 1 tablet by mouth daily.   Yes [provider]  Niacin CR 1000 MG TBCR Take 1,000 mg by mouth every evening.   Yes [provider]  pravastatin (PRAVACHOL) 40 MG tablet Take 40 mg by mouth at bedtime.    Yes [provider]  traZODone (DESYREL) 50 MG tablet Take 50 mg by mouth at bedtime.    Yes [provider]  triamcinolone cream (KENALOG) 0.1 % Apply 1 application topically 2 (two) times daily as needed (rash).    Yes [provider]  Turmeric 500 MG CAPS Take 500 mg by mouth daily.   Yes [provider]  glucose blood test strip 1 each by Other route  as needed for other. Use as instructed    [provider]    Physical Exam: Vitals:   03/24/22 1730 03/24/22 1800 03/24/22 1808 03/24/22 1826  BP: 122/83 134/88  (!) 132/92  Pulse: 84 83  84  Resp:  18  18  Temp:   97.9 F (36.6 C) 97.8 F (36.6 C)  TempSrc:   Oral Oral  SpO2: 96% 100%  98%  Weight:    104.3 kg  Height:    5\' 9"  (1.753 m)   General exam: Alert, awake, oriented x 3; no chest pain, no nausea, no vomiting.  Reports feeling weak and at home has experienced intermittent episodes of lightheadedness. Respiratory system: Clear to auscultation. Respiratory effort normal.  No requiring oxygen supplementation.  Good saturation on room air. Cardiovascular system: Rate controlled, no rubs, no gallops, no JVD. Gastrointestinal system: Abdomen is nondistended, soft and nontender. No organomegaly or masses felt. Normal bowel sounds heard. Central nervous system: Alert and oriented. No focal neurological deficits. Extremities: No cyanosis or clubbing. Skin: No petechiae. Psychiatry: Judgement and insight appear normal. Mood & affect appropriate.   Data Reviewed: Complete metabolic panel demonstrating sodium 134, potassium 3.9,  chloride 100, BUN 87, creatinine 3.38, AST 18, ALT 16, alkaline phosphatase 36, GFR 19.  Anion gap 11. CBC: WBCs 11.5, hemoglobin 7.7, platelet count 295 K. Prothrombin time 13.5, INR 1.0   Assessment and Plan: * Acute on chronic blood loss anemia - Patient hemoglobin down to 7.7; baseline around 9-10. -Positive fecal occult blood test appreciated on admission. -Patient reporting melanotic stools, prior history of GI bleed in the setting of stomach ulcers. -Will allow clear liquid diet -Type and screen; 1 unit PRBCs will be transfused -IV PPI twice a day has been initiated. -Follow hemoglobin trend -GI service consulted with plan for endoscopic evaluation. -N.p.o. after midnight.  Acute renal failure superimposed on stage 3b chronic kidney disease ([place]) - Patient baseline creatinine in the 2.6-2.7 range with a GFR in the 20-30s. -On presentation creatinine 3.38 with a GFR down to 19 -Holding nephrotoxic agents -Fluid resuscitation, blood transfusion will be provided -Minimize/avoid the use of contrast and avoid hypotension. -Follow renal function trend.  Essential hypertension - Overall blood pressure stable -Holding antihypertensive agents in the setting of acute bleeding, symptoms of lightheadedness and worsening renal function. -Follow-up vital signs.  Type 2 diabetes with nephropathy ([facility]) - A1c will be updated -Sliding scale insulin will be provided. -Follow CBGs and adjust hypoglycemia regimen as required.  GERD (gastroesophageal reflux disease) - As mentioned in GI bleed section will provide Protonix twice a day. -Follow clinical response.      Advance Care Planning:   Code Status: Full Code   Consults: GI service  Family Communication: No family at bedside.  Severity of Illness: The appropriate patient status for this patient is OBSERVATION. Observation status is judged to be reasonable and necessary in order to provide the required intensity of service to  ensure the patient's safety. The patient's presenting symptoms, physical exam findings, and initial radiographic and laboratory data in the context of their medical condition is felt to place them at decreased risk for further clinical deterioration. Furthermore, it is anticipated that the patient will be medically stable for discharge from the hospital within 2 midnights of admission.   Author: Barton Dubois, MD 03/24/2022 7:08 PM  For on call review [email or web address].

## 2022-03-24 NOTE — Assessment & Plan Note (Addendum)
-   A1c 5.3 -Resume hypoglycemic regimen and follow-up with PCP at discharge -Continue to check CBGs; patient advised to follow modified carbohydrate diet.

## 2022-03-24 NOTE — Assessment & Plan Note (Addendum)
-   As mentioned in GI bleed section will continue therapy with Protonix twice a day. -Follow clinical response.

## 2022-03-24 NOTE — ED Triage Notes (Signed)
Pt send to ED for abnormal kidney function and low hemoglobin. Pt unsure levels.

## 2022-03-24 NOTE — Assessment & Plan Note (Addendum)
-  Overall blood pressure stable -Continue holding hydralazine and Cozaar at time of discharge; safe to resume the rest of his antihypertensive agents at adjusted dose. -Patient advised to follow heart healthy diet.  -Reassess blood pressure and further adjust medications as needed.

## 2022-03-24 NOTE — Assessment & Plan Note (Addendum)
-  Patient baseline creatinine in the 2.6-2.7 range with a GFR in the 20-30s. -On presentation creatinine 3.38 with a GFR down to 19 -Will continue minimizing the use of nephrotoxic agents -Continue to maintain adequate hydration, avoid hypotension and avoid the use of contrast.   -Creatinine trending down and currently 2.44 (baseline for him)  -Continue to follow renal function trend.

## 2022-03-24 NOTE — ED Notes (Signed)
Pt was given a warm blanket

## 2022-03-24 NOTE — Consult Note (Signed)
Gastroenterology Consult   Referring Provider: No ref. provider found Primary Care Physician:  Everardo Beals, NP Primary Gastroenterologist:  Dr. Garfield Cornea, MD   Patient ID: Bryan Atkinson; [id number]; Apr 22, 1956   Admit date: 03/24/2022  LOS: 0 days   Date of Consultation: 03/24/2022  Reason for Consultation:  melena, anemia    History of Present Illness   Bryan Atkinson is a 66 y.o. male with history of anemia from chronic kidney disease and relative iron deficiency, intermittently receives IV iron(last infusion in 06/2022), gastric ulcer, stage IV chronic kidney disease followed by Dr. Theador Hawthorne presenting to the ED with worsening anemia and renal function.  Patient reports stools have been black but he is on oral iron. Felt like sticky/tarry which was different consistency then usual. Some early satiety but otherwise appetite has been on ok. Heartburn on occasion, takes OTC antacids. Used to be on pantoprazole but this was stopped several months ago. No dysphagia. No vomiting. No abdominal pain. BM regular. No brbpr. States he had a little bit of blood from the nose when he had uri infection few weeks ago but nothing significant. He has noted dizziness, fatigue over past couple of weeks. Outpatient labs from PCP showed drop in his hgb and worsening renal function and he was advised to go to the ED. Patient denies NSAIDS or ASA powders.  In the ED: Vital signs stable.  Hemoglobin 7.7 (10.7 two weeks ago), creatinine 3.38 (2.79 two weeks ago), BUN 87 (50).  Stool black heme positive on exam. He had pack of nabs before coming to the ED.  EGD August 2021: 2 gastric ulcers.  No H. pylori.  Likely NSAID related, taking Aleve at that time.  EGD December 2021: Gastric scar status post healed ulcer.  Colonoscopy 2019: Diverticulosis, 5-year follow-up recommended due to family history of colon cancer (mother).   Prior to Admission medications   Medication Sig Start Date  End Date Taking? Authorizing Provider  acetaminophen (TYLENOL) 500 MG tablet Take 500 mg by mouth every 6 (six) hours as needed for mild pain.   Yes [provider]  allopurinol (ZYLOPRIM) 300 MG tablet Take 300 mg by mouth daily.   Yes [provider]  amLODipine (NORVASC) 5 MG tablet Take 5 mg by mouth daily. 03/10/22  Yes [provider]  Apple Cider Vinegar 500 MG TABS Take 500 mg by mouth daily.    Yes [provider]  aspirin EC 81 MG tablet Take 81 mg by mouth daily. Swallow whole.   Yes [provider]  carvedilol (COREG) 25 MG tablet Take 25 mg by mouth 2 (two) times daily. 12/03/20  Yes [provider]  Co-Enzyme Q10 100 MG CAPS Take 100 mg by mouth daily.    Yes [provider]  Cranberry 500 MG TABS Take 500 mg by mouth daily.   Yes [provider]  FARXIGA 5 MG TABS tablet Take 5 mg by mouth every morning. 02/17/22  Yes [provider]  fenofibrate (TRICOR) 145 MG tablet Take 145 mg by mouth daily.   Yes [provider]  ferrous sulfate 325 (65 FE) MG tablet Take 325 mg by mouth daily with breakfast.   Yes [provider]  Flaxseed, Linseed, (FLAXSEED OIL) 1000 MG CAPS Take 1,000 mg by mouth daily.   Yes [provider]  furosemide (LASIX) 20 MG tablet Take 20 mg by mouth 2 (two) times daily.   Yes [provider]  glipiZIDE (GLUCOTROL  XL) 10 MG 24 hr tablet Take 10 mg by mouth daily as needed (If blood sugar is > 130).  06/17/20  Yes [provider]  hydrALAZINE (APRESOLINE) 100 MG tablet Take 100 mg by mouth 3 (three) times daily. 06/20/21  Yes [provider]  Javier Docker Oil 500 MG CAPS Take 500 mg by mouth 2 (two) times a day.   Yes [provider]  losartan (COZAAR) 25 MG tablet Take 50 mg by mouth once. 10/29/21  Yes [provider]  Multiple Vitamin (MULTIVITAMIN) tablet Take 1 tablet by mouth daily.   Yes [provider]  Niacin CR  1000 MG TBCR Take 1,000 mg by mouth every evening.   Yes [provider]  pravastatin (PRAVACHOL) 40 MG tablet Take 40 mg by mouth at bedtime.    Yes [provider]  traZODone (DESYREL) 50 MG tablet Take 50 mg by mouth at bedtime.    Yes [provider]  triamcinolone cream (KENALOG) 0.1 % Apply 1 application topically 2 (two) times daily as needed (rash).    Yes [provider]  Turmeric 500 MG CAPS Take 500 mg by mouth daily.   Yes [provider]  glucose blood test strip 1 each by Other route as needed for other. Use as instructed    [provider]    Current Facility-Administered Medications  Medication Dose Route Frequency Provider Last Rate Last Admin   0.9 %  sodium chloride infusion (Manually program via Guardrails IV Fluids)   Intravenous Once Barton Dubois, MD       0.9 %  sodium chloride infusion   Intravenous Continuous Barton Dubois, MD       acetaminophen (TYLENOL) tablet 650 mg  650 mg Oral Q6H PRN Barton Dubois, MD       Or   acetaminophen (TYLENOL) suppository 650 mg  650 mg Rectal Q6H PRN Barton Dubois, MD       furosemide (LASIX) injection 20 mg  20 mg Intravenous Once Barton Dubois, MD       insulin aspart (novoLOG) injection 0-5 Units  0-5 Units Subcutaneous QHS Barton Dubois, MD       insulin aspart (novoLOG) injection 0-9 Units  0-9 Units Subcutaneous TID WC Barton Dubois, MD       ondansetron [facility]) tablet 4 mg  4 mg Oral Q6H PRN Barton Dubois, MD       Or   ondansetron [facility]) injection 4 mg  4 mg Intravenous Q6H PRN Barton Dubois, MD       pantoprazole (PROTONIX) injection 40 mg  40 mg Intravenous Q12H Barton Dubois, MD       Current Outpatient Medications  Medication Sig Dispense Refill   acetaminophen (TYLENOL) 500 MG tablet Take 500 mg by mouth every 6 (six) hours as needed for mild pain.     allopurinol (ZYLOPRIM) 300 MG tablet Take 300 mg by mouth daily.     amLODipine (NORVASC) 5 MG tablet  Take 5 mg by mouth daily.     Apple Cider Vinegar 500 MG TABS Take 500 mg by mouth daily.      aspirin EC 81 MG tablet Take 81 mg by mouth daily. Swallow whole.     carvedilol (COREG) 25 MG tablet Take 25 mg by mouth 2 (two) times daily.     Co-Enzyme Q10 100 MG CAPS Take 100 mg by mouth daily.      Cranberry 500 MG TABS Take 500 mg by mouth daily.  FARXIGA 5 MG TABS tablet Take 5 mg by mouth every morning.     fenofibrate (TRICOR) 145 MG tablet Take 145 mg by mouth daily.     ferrous sulfate 325 (65 FE) MG tablet Take 325 mg by mouth daily with breakfast.     Flaxseed, Linseed, (FLAXSEED OIL) 1000 MG CAPS Take 1,000 mg by mouth daily.     furosemide (LASIX) 20 MG tablet Take 20 mg by mouth 2 (two) times daily.     glipiZIDE (GLUCOTROL XL) 10 MG 24 hr tablet Take 10 mg by mouth daily as needed (If blood sugar is > 130).      hydrALAZINE (APRESOLINE) 100 MG tablet Take 100 mg by mouth 3 (three) times daily.     Krill Oil 500 MG CAPS Take 500 mg by mouth 2 (two) times a day.     losartan (COZAAR) 25 MG tablet Take 50 mg by mouth once.     Multiple Vitamin (MULTIVITAMIN) tablet Take 1 tablet by mouth daily.     Niacin CR 1000 MG TBCR Take 1,000 mg by mouth every evening.     pravastatin (PRAVACHOL) 40 MG tablet Take 40 mg by mouth at bedtime.      traZODone (DESYREL) 50 MG tablet Take 50 mg by mouth at bedtime.      triamcinolone cream (KENALOG) 0.1 % Apply 1 application topically 2 (two) times daily as needed (rash).      Turmeric 500 MG CAPS Take 500 mg by mouth daily.     glucose blood test strip 1 each by Other route as needed for other. Use as instructed      Allergies as of 03/24/2022   (No Known Allergies)    Past Medical History:  Diagnosis Date   Arthritis    Diabetes mellitus without complication ([place])    Gout    History of kidney stones    Hyperlipidemia    Hypertension     Past Surgical History:  Procedure Laterality Date   BIOPSY  05/25/2020   Procedure: BIOPSY;   Surgeon: Daneil Dolin, MD;  Location: [facility];  Service: Endoscopy;;   CATARACT EXTRACTION W/PHACO Right 05/13/2019   Procedure: CATARACT EXTRACTION PHACO AND INTRAOCULAR LENS PLACEMENT ([place]);  Surgeon: Baruch Goldmann, MD;  Location: [facility];  Service: Ophthalmology;  Laterality: Right;  CDE: 10.31   CATARACT EXTRACTION W/PHACO Left 05/27/2019   Procedure: CATARACT EXTRACTION PHACO AND INTRAOCULAR LENS PLACEMENT ([facility]);  Surgeon: Baruch Goldmann, MD;  Location: [facility];  Service: Ophthalmology;  Laterality: Left;  CDE: 6.49   COLONOSCOPY     times two 2002 ,2007   COLONOSCOPY N/A 10/07/2013   Procedure: COLONOSCOPY;  Surgeon: Daneil Dolin, MD;  Location: [facility];  Service: Endoscopy;  Laterality: N/A;  8:30 AM   COLONOSCOPY N/A 10/05/2018   Dr. Gala Romney: Diverticulosis.  Next colonoscopy 5 years due to first-degree relative with colon cancer   ESOPHAGOGASTRODUODENOSCOPY (EGD) WITH PROPOFOL N/A 05/25/2020   [name]: 2 gastric ulcers.  No H. pylori.  Likely NSAID related.  Repeat EGD in 3 months   ESOPHAGOGASTRODUODENOSCOPY (EGD) WITH PROPOFOL N/A 09/17/2020   Procedure: ESOPHAGOGASTRODUODENOSCOPY (EGD) WITH PROPOFOL;  Surgeon: Daneil Dolin, MD;  Location: [facility];  Service: Endoscopy;  Laterality: N/A;  10:00am   JOINT REPLACEMENT     KNEE ARTHROPLASTY Left 06/27/2019   Procedure: COMPUTER ASSISTED TOTAL KNEE ARTHROPLASTY;  Surgeon: Rod Can, MD;  Location: [facility];  Service: Orthopedics;  Laterality: Left;  KNEE ARTHROPLASTY Right 08/15/2019   Procedure: COMPUTER ASSISTED TOTAL KNEE ARTHROPLASTY;  Surgeon: Rod Can, MD;  Location: [facility];  Service: Orthopedics;  Laterality: Right;   LEG SURGERY Right early 55's    Family History  Problem Relation Age of Onset   Colon cancer Mother        She died from her malignancy, 36   Lung cancer Father        Died from lung cancer, age 37   Emphysema Brother        Died at 31 from lung disease   COPD Brother    Dementia  Sister    Healthy Son    Healthy Daughter     Social History   Socioeconomic History   Marital status: Married    Spouse name: Not on file   Number of children: 2   Years of education: Not on file   Highest education level: Not on file  Occupational History   Occupation: Truck Geophysicist/field seismologist    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  3 grandchildren. Married for 34 years   Social Determinants of Radio broadcast assistant Strain: Not on file  Food Insecurity: Not on file  Transportation Needs: Not on file  Physical Activity: Not on file  Stress: Not on file  Social Connections: Not on file  Intimate Partner Violence: Not on file     Review of System:   General: Negative for anorexia, weight loss, fever, chills,+ fatigue, weakness. Eyes: Negative for vision changes.  ENT: Negative for hoarseness, difficulty swallowing , nasal congestion. CV: Negative for chest pain, angina, palpitations, dyspnea on exertion, peripheral edema.  Respiratory: Negative for dyspnea at rest, dyspnea on exertion, cough, sputum, wheezing.  GI: See history of present illness. GU:  Negative for dysuria, hematuria, urinary incontinence, urinary frequency, nocturnal urination.  MS: Negative for joint pain, low back pain.  Derm: Negative for rash or itching.  Neuro: Negative for weakness, abnormal sensation, seizure, frequent headaches, memory loss, confusion.  Psych: Negative for anxiety, depression, suicidal ideation, hallucinations.  Endo: Negative for unusual weight change.  Heme: Negative for bruising or bleeding. Allergy: Negative for rash or hives.      Physical  Examination:   Vital signs in last 24 hours: Temp:  [97.5 F (36.4 C)] 97.5 F (36.4 C) (06/08 1031) Pulse Rate:  [76-84] 76 (06/08 1230) Resp:  [18-20] 20 (06/08 1230) BP: (115-124)/(67-70) 117/68 (06/08 1230) SpO2:  [97 %-100 %] 99 % (06/08 1230) Weight:  [105.2 kg] 105.2 kg (06/08 1031)    General: Well-nourished, well-developed in no acute distress.  Head: Normocephalic, atraumatic.   Eyes: Conjunctiva pale, no icterus. Mouth: Oropharyngeal mucosa moist and pink , no lesions erythema or exudate. Neck: Supple without thyromegaly, masses, or lymphadenopathy.  Lungs: Clear to auscultation bilaterally.  Heart: Regular rate and rhythm, no murmurs rubs or gallops.  Abdomen: Bowel sounds are normal, nontender, nondistended, no hepatosplenomegaly or masses, no abdominal bruits or hernia , no rebound or guarding.   Rectal: not performed Extremities: No lower extremity edema, clubbing, deformity.  Neuro: Alert and oriented x 4 , grossly normal neurologically.  Skin: Warm and dry, no rash or jaundice.  Pale. Psych: Alert and cooperative, normal mood and affect.        Intake/Output from previous day: No intake/output data recorded. Intake/Output this shift: Total I/O In: 602.1 [I.V.:118.8; IV Piggyback:483.3] Out: -   Lab Results:   CBC Recent Labs    03/24/22 1048  WBC 11.5*  HGB 7.7*  HCT 23.5*  MCV 96.7  PLT 295   BMET Recent Labs    03/24/22 1048  NA 134*  K 3.9  CL 100  CO2 23  GLUCOSE 152*  BUN 87*  CREATININE 3.38*  CALCIUM 9.7   LFT Recent Labs    03/24/22 1048  BILITOT 0.6  ALKPHOS 36*  AST 18  ALT 16  PROT 6.5  ALBUMIN 3.6    Lipase No results for input(s): "LIPASE" in the last 72 hours.  PT/INR Recent Labs    03/24/22 1048  LABPROT 13.5  INR 1.0     Hepatitis Panel No results for input(s): "HEPBSAG", "HCVAB", "HEPAIGM", "HEPBIGM" in the last 72 hours.   Radiology/Studies No results found.    Imaging Studies:   No results  found.Minnie.Brome week]  Assessment:   Pleasant 66 year old male with history of chronic kidney disease, mixed anemia, prior gastric ulcer in 2021 who presents with acute on chronic anemia with heme positive stool.    GI bleed: Acute on chronic anemia.  Component of iron deficiency followed by hematology.  He is on oral iron regularly and receives intermittent IV iron, last infusion in September 2022.  Describes symptomatic anemia over the past couple of weeks.  Stools have been dark as usual on iron but he has noted a different consistency, somewhat sticky/tarry.  Denies any NSAID use.  Takes daily aspirin.  BUN also significantly elevated.  Suspect upper GI bleed.  Prior history of gastric ulcer in the setting of NSAID use in 2021 with documented healing.  May have recurrent ulcer versus AVMs, he is at risk in the setting of chronic kidney disease.  Cannot exclude other etiologies such as Dieulafoy lesion.    Plan:   Plan for upper endoscopy with Dr. Laural Golden tomorrow.  I have discussed the risks, alternatives, benefits with regards to but not limited to the risk of reaction to medication, bleeding, infection, perforation and the patient is agreeable to proceed. Written consent to be obtained. Liquid diet today, n.p.o. after midnight. IV pantoprazole 40 mg twice daily. Recheck CBC in the morning, transfuse if needed.   LOS: 0 days   We would like to thank you for the opportunity to participate in the care of [employer].  Laureen Ochs. Bernarda Caffey [facility] [phone] 6/8/20231:37 PM

## 2022-03-24 NOTE — ED Provider Notes (Signed)
[facility] EMERGENCY DEPARTMENT Provider Note   CSN: [id number] Arrival date & time: 03/24/22  1011     History  Chief Complaint  Patient presents with   Abnormal Labs    Bryan Atkinson is a 66 y.o. male.  HPI Patient sent in from PCP.  Reportedly worsening anemia and worsening kidney function.  Has a history of both anemia and bad kidneys.  Has had previous GI bleeds.  He is feeling more lightheaded and more dizzy.  Stool has been somewhat black but states he is on iron.  Has had also iron infusions.  Has seen Dr. Gala Romney for previous ulcers.  Has been feeling bad for around 2 weeks now.  States he was told his kidneys are worse.  Reviewed blood work from an outpatient hemoglobin has come down to 8.11 previously had been about 10.  Also creatinine up to 3.51 previously had been upper twos and maybe 3.    Home Medications Prior to Admission medications   Medication Sig Start Date End Date Taking? Authorizing Provider  acetaminophen (TYLENOL) 500 MG tablet Take 500 mg by mouth every 6 (six) hours as needed for mild pain.    [provider]  allopurinol (ZYLOPRIM) 300 MG tablet Take 300 mg by mouth daily.    [provider]  amLODipine (NORVASC) 5 MG tablet Take 5 mg by mouth daily. 03/10/22   [provider]  Apple Cider Vinegar 500 MG TABS Take 500 mg by mouth daily.     [provider]  aspirin EC 81 MG tablet Take 81 mg by mouth daily. Swallow whole.    [provider]  carvedilol (COREG) 25 MG tablet Take 25 mg by mouth 2 (two) times daily. 12/03/20   [provider]  Co-Enzyme Q10 100 MG CAPS Take 100 mg by mouth daily.     [provider]  Cranberry 500 MG TABS Take 500 mg by mouth daily.    [provider]  FARXIGA 5 MG TABS tablet Take 5 mg by mouth every morning. 02/17/22   [provider]  fenofibrate (TRICOR) 145 MG tablet Take 145 mg by mouth daily.    [provider]  ferrous  sulfate 325 (65 FE) MG tablet Take 325 mg by mouth daily with breakfast.    [provider]  Flaxseed, Linseed, (FLAXSEED OIL) 1000 MG CAPS Take 1,000 mg by mouth daily.    [provider]  furosemide (LASIX) 20 MG tablet furosemide 20 mg tablet  Take 1 tablet twice a day by oral route.    [provider]  glipiZIDE (GLUCOTROL XL) 10 MG 24 hr tablet Take 10 mg by mouth daily as needed (If blood sugar is > 130).  06/17/20   [provider]  glucose blood test strip 1 each by Other route as needed for other. Use as instructed    [provider]  hydrALAZINE (APRESOLINE) 100 MG tablet Take 100 mg by mouth 3 (three) times daily. 06/20/21   [provider]  Javier Docker Oil 500 MG CAPS Take 500 mg by mouth 2 (two) times a day.    [provider]  losartan (COZAAR) 25 MG tablet Take 50 mg by mouth once. 10/29/21   [provider]  methocarbamol (ROBAXIN) 500 MG tablet Take 500 mg by mouth 2 (two) times daily as needed. 06/03/21   [provider]  Multiple Vitamin (MULTIVITAMIN) tablet Take 1 tablet by mouth daily.    [provider]  Niacin CR 1000 MG TBCR niacin ER 1,000 mg tablet,extended release 24 hr    [provider]  pravastatin (PRAVACHOL) 40 MG tablet Take 40 mg by mouth at bedtime.     [provider]  traZODone (DESYREL) 50 MG tablet Take 50 mg by mouth at bedtime.     [provider]  triamcinolone cream (KENALOG) 0.1 % Apply 1 application topically 2 (two) times daily as needed (rash).     [provider]  Turmeric 500 MG CAPS Take 500 mg by mouth daily.    [provider]      Allergies    Patient has no known allergies.    Review of Systems   Review of Systems  Physical Exam Updated Vital Signs BP 115/70   Pulse 84   Temp (!) 97.5 F (36.4 C) (Oral)   Resp 18   Ht 5\' 9"  (1.753 m)   Wt 105.2 kg   SpO2 100%   BMI 34.26 kg/m  Physical Exam Vitals and  nursing note reviewed.  HENT:     Head: Normocephalic.  Cardiovascular:     Rate and Rhythm: Regular rhythm.  Pulmonary:     Breath sounds: No wheezing or rhonchi.  Abdominal:     Tenderness: There is no abdominal tenderness.  Genitourinary:    Rectum: Guaiac result positive.     Comments: Black stool that is guaiac positive. Musculoskeletal:     Cervical back: Neck supple.  Skin:    General: Skin is warm.     Capillary Refill: Capillary refill takes less than 2 seconds.  Neurological:     Mental Status: He is alert and oriented to person, place, and time.     ED Results / Procedures / Treatments   Labs (all labs ordered are listed, but only abnormal results are displayed) Labs Reviewed  CBC WITH DIFFERENTIAL/PLATELET - Abnormal; Notable for the following components:      Result Value   WBC 11.5 (*)    RBC 2.43 (*)    Hemoglobin 7.7 (*)    HCT 23.5 (*)    Neutro Abs 7.9 (*)    Abs Immature Granulocytes 0.50 (*)    All other components within normal limits  COMPREHENSIVE METABOLIC PANEL - Abnormal; Notable for the following components:   Sodium 134 (*)    Glucose, Bld 152 (*)    BUN 87 (*)    Creatinine, Ser 3.38 (*)    Alkaline Phosphatase 36 (*)    GFR, Estimated 19 (*)    All other components within normal limits  POC OCCULT BLOOD, ED - Abnormal; Notable for the following components:   Fecal Occult Bld POSITIVE (*)    All other components within normal limits  PROTIME-INR  TYPE AND SCREEN    EKG None  Radiology No results found.  Procedures Procedures    Medications Ordered in ED Medications  0.9 %  sodium chloride infusion (has no administration in time range)  pantoprazole (PROTONIX) injection 40 mg (has no administration in time range)  sodium chloride 0.9 % bolus 500 mL (has no administration in time range)    ED Course/ Medical Decision Making/ A&P                           Medical Decision Making Amount and/or Complexity of Data  Reviewed Labs: ordered.  Risk Prescription drug management. Decision regarding hospitalization.   Patient presents with worsening anemia and worsening  kidney function.  Creatinine increased from below 3 to now about 3.5..  Hemoglobin also down from 10 down to 7.7.  Is symptomatic with anemia.  Guaiac positive.  Lightheaded.  Has had previous ulcers.  Seen Dr. Gala Romney in the past.  Will require admission to the hospital.  Patient is normocytic however.  Will likely require transfusion but will wait to see if any more labs are needed from admitting team.  Will discuss with hospitalist and GI  Discussed with hospitalist who will admit and discussed with Dr. Abbey Chatters from GI who will see patient in consult.  CRITICAL CARE Performed by: Davonna Belling Total critical care time: 30 minutes Critical care time was exclusive of separately billable procedures and treating other patients. Critical care was necessary to treat or prevent imminent or life-threatening deterioration. Critical care was time spent personally by me on the following activities: development of treatment plan with patient and/or surrogate as well as nursing, discussions with consultants, evaluation of patient's response to treatment, examination of patient, obtaining history from patient or surrogate, ordering and performing treatments and interventions, ordering and review of laboratory studies, ordering and review of radiographic studies, pulse oximetry and re-evaluation of patient's condition.         Final Clinical Impression(s) / ED Diagnoses Final diagnoses:  Gastrointestinal hemorrhage, unspecified gastrointestinal hemorrhage type  Symptomatic anemia    Rx / DC Orders ED Discharge Orders     None         Davonna Belling, MD 03/24/22 1516

## 2022-03-24 NOTE — Assessment & Plan Note (Addendum)
-  Patient's hemoglobin has remained stable/improved at 8.4 at time of discharge. -1 unit PRBC transfusion provided during hospitalization at 7.7 after 1 unit of PRBCs. -Hemodynamically stable and not demonstrating overt bleeding. -Patient has tolerated diet without problems. -GI service will follow-up H. pylori serology and tissue sampling. -Continue PPI twice a day. -Status post endoscopic evaluation demonstrating nonbleeding gastric ulcer and some melanosis.

## 2022-03-25 ENCOUNTER — Observation Stay (HOSPITAL_COMMUNITY): Payer: 59 | Admitting: Anesthesiology

## 2022-03-25 ENCOUNTER — Encounter (HOSPITAL_COMMUNITY): Payer: Self-pay | Admitting: Internal Medicine

## 2022-03-25 ENCOUNTER — Ambulatory Visit (HOSPITAL_COMMUNITY): Admit: 2022-03-25 | Payer: 59 | Admitting: Gastroenterology

## 2022-03-25 ENCOUNTER — Encounter (HOSPITAL_COMMUNITY)
Admission: EM | Disposition: A | Discharge status: Home / Self Care | Payer: Self-pay | Source: Home / Self Care | Attending: Emergency Medicine

## 2022-03-25 ENCOUNTER — Observation Stay (HOSPITAL_BASED_OUTPATIENT_CLINIC_OR_DEPARTMENT_OTHER): Payer: 59 | Admitting: Anesthesiology

## 2022-03-25 ENCOUNTER — Telehealth: Payer: Self-pay | Admitting: Gastroenterology

## 2022-03-25 DIAGNOSIS — N179 Acute kidney failure, unspecified: Secondary | ICD-10-CM

## 2022-03-25 DIAGNOSIS — K922 Gastrointestinal hemorrhage, unspecified: Secondary | ICD-10-CM

## 2022-03-25 DIAGNOSIS — K254 Chronic or unspecified gastric ulcer with hemorrhage: Secondary | ICD-10-CM

## 2022-03-25 DIAGNOSIS — K259 Gastric ulcer, unspecified as acute or chronic, without hemorrhage or perforation: Secondary | ICD-10-CM

## 2022-03-25 DIAGNOSIS — K449 Diaphragmatic hernia without obstruction or gangrene: Secondary | ICD-10-CM

## 2022-03-25 DIAGNOSIS — I10 Essential (primary) hypertension: Secondary | ICD-10-CM | POA: Diagnosis not present

## 2022-03-25 DIAGNOSIS — D62 Acute posthemorrhagic anemia: Secondary | ICD-10-CM | POA: Diagnosis not present

## 2022-03-25 DIAGNOSIS — K921 Melena: Secondary | ICD-10-CM | POA: Diagnosis not present

## 2022-03-25 DIAGNOSIS — D509 Iron deficiency anemia, unspecified: Secondary | ICD-10-CM

## 2022-03-25 DIAGNOSIS — N1832 Chronic kidney disease, stage 3b: Secondary | ICD-10-CM

## 2022-03-25 HISTORY — PX: BIOPSY: SHX5522

## 2022-03-25 HISTORY — PX: ESOPHAGOGASTRODUODENOSCOPY (EGD) WITH PROPOFOL: SHX5813

## 2022-03-25 LAB — TYPE AND SCREEN
ABO/RH(D): O POS
Antibody Screen: NEGATIVE
Unit division: 0

## 2022-03-25 LAB — BASIC METABOLIC PANEL
Anion gap: 6 (ref 5–15)
BUN: 69 mg/dL — ABNORMAL HIGH (ref 8–23)
CO2: 23 mmol/L (ref 22–32)
Calcium: 8.9 mg/dL (ref 8.9–10.3)
Chloride: 110 mmol/L (ref 98–111)
Creatinine, Ser: 2.93 mg/dL — ABNORMAL HIGH (ref 0.61–1.24)
GFR, Estimated: 23 mL/min — ABNORMAL LOW (ref >60)
Glucose, Bld: 78 mg/dL (ref 70–99)
Potassium: 3.7 mmol/L (ref 3.5–5.1)
Sodium: 139 mmol/L (ref 135–145)

## 2022-03-25 LAB — CBC
HCT: 23.8 % — ABNORMAL LOW (ref 39.0–52.0)
Hemoglobin: 7.7 g/dL — ABNORMAL LOW (ref 13.0–17.0)
MCH: 31.6 pg (ref 26.0–34.0)
MCHC: 32.4 g/dL (ref 30.0–36.0)
MCV: 97.5 fL (ref 80.0–100.0)
Platelets: 253 10*3/uL (ref 150–400)
RBC: 2.44 MIL/uL — ABNORMAL LOW (ref 4.22–5.81)
RDW: 13.5 % (ref 11.5–15.5)
WBC: 10.4 10*3/uL (ref 4.0–10.5)
nRBC: 0 % (ref 0.0–0.2)

## 2022-03-25 LAB — BPAM RBC
Blood Product Expiration Date: 202307132359
ISSUE DATE / TIME: 202306081338
Unit Type and Rh: 5100

## 2022-03-25 LAB — GLUCOSE, CAPILLARY
Glucose-Capillary: 93 mg/dL (ref 70–99)
Glucose-Capillary: 85 mg/dL (ref 70–99)
Glucose-Capillary: 91 mg/dL (ref 70–99)
Glucose-Capillary: 95 mg/dL (ref 70–99)
Glucose-Capillary: 117 mg/dL — ABNORMAL HIGH (ref 70–99)
Glucose-Capillary: 135 mg/dL — ABNORMAL HIGH (ref 70–99)

## 2022-03-25 SURGERY — ESOPHAGOGASTRODUODENOSCOPY (EGD) WITH PROPOFOL
Anesthesia: General

## 2022-03-25 MED ORDER — LACTATED RINGERS IV SOLN: INTRAVENOUS | Status: DC | PRN

## 2022-03-25 MED ORDER — LIDOCAINE HCL (CARDIAC) PF 50 MG/5ML IV SOSY
PREFILLED_SYRINGE | INTRAVENOUS | Status: DC | PRN
  Administered 2022-03-25: 100 mg via INTRAVENOUS

## 2022-03-25 MED ORDER — LACTATED RINGERS IV SOLN: INTRAVENOUS | Status: DC

## 2022-03-25 MED ORDER — SODIUM CHLORIDE 0.9 % IV SOLN: INTRAVENOUS | Status: DC

## 2022-03-25 MED ORDER — PROPOFOL 10 MG/ML IV BOLUS
INTRAVENOUS | Status: DC | PRN
  Administered 2022-03-25: 50 mg via INTRAVENOUS
  Administered 2022-03-25: 150 mg via INTRAVENOUS

## 2022-03-25 NOTE — Anesthesia Preprocedure Evaluation (Signed)
Anesthesia Evaluation  Patient identified by MRN, date of birth, ID band Patient awake    Reviewed: Allergy & Precautions, H&P , NPO status , Patient's Chart, lab work & pertinent test results, reviewed documented beta blocker date and time   Airway Mallampati: II  TM Distance: >3 FB Neck ROM: full    Dental no notable dental hx.    Pulmonary neg pulmonary ROS,    Pulmonary exam normal breath sounds clear to auscultation       Cardiovascular Exercise Tolerance: Good hypertension, negative cardio ROS   Rhythm:regular Rate:Normal     Neuro/Psych negative neurological ROS  negative psych ROS   GI/Hepatic Neg liver ROS, PUD, GERD  Medicated,  Endo/Other  negative endocrine ROSdiabetes  Renal/GU ARFRenal disease  negative genitourinary   Musculoskeletal   Abdominal   Peds  Hematology  (+) Blood dyscrasia, anemia ,   Anesthesia Other Findings   Reproductive/Obstetrics negative OB ROS                             Anesthesia Physical  Anesthesia Plan  ASA: 3 and emergent  Anesthesia Plan: General   Post-op Pain Management:    Induction:   PONV Risk Score and Plan: Propofol infusion  Airway Management Planned:   Additional Equipment:   Intra-op Plan:   Post-operative Plan:   Informed Consent: I have reviewed the patients History and Physical, chart, labs and discussed the procedure including the risks, benefits and alternatives for the proposed anesthesia with the patient or authorized representative who has indicated his/her understanding and acceptance.     Dental Advisory Given  Plan Discussed with: CRNA  Anesthesia Plan Comments:         Anesthesia Quick Evaluation

## 2022-03-25 NOTE — Transfer of Care (Signed)
Immediate Anesthesia Transfer of Care Note  Patient: Bryan Atkinson  Procedure(s) Performed: ESOPHAGOGASTRODUODENOSCOPY (EGD) WITH PROPOFOL BIOPSY  Patient Location: PACU  Anesthesia Type:General  Level of Consciousness: awake and patient cooperative  Airway & Oxygen Therapy: Patient Spontanous Breathing  Post-op Assessment: Report given to RN and Post -op Vital signs reviewed and stable  Post vital signs: Reviewed and stable  Last Vitals:  Vitals Value Taken Time  BP 105/75 03/25/22 1325  Temp 36.7 C 03/25/22 1325  Pulse 85 03/25/22 1326  Resp 21 03/25/22 1326  SpO2 94 % 03/25/22 1326  Vitals shown include unvalidated device data.  Last Pain:  Vitals:   03/25/22 1307  TempSrc:   PainSc: 0-No pain         Complications: No notable events documented.

## 2022-03-25 NOTE — Brief Op Note (Signed)
03/24/2022 - 03/25/2022  1:25 PM  PATIENT:  Bryan Atkinson  66 y.o. male  PRE-OPERATIVE DIAGNOSIS:  acute chronic anemia, melena, heme positive stool  POST-OPERATIVE DIAGNOSIS:  hiatal hernia, gastric ulcers  PROCEDURE:  Procedure(s): ESOPHAGOGASTRODUODENOSCOPY (EGD) WITH PROPOFOL (N/A) BIOPSY  SURGEON:  Surgeon(s) and Role:    * Harvel Quale, MD - Primary  Patient underwent EGD under propofol sedation.  Tolerated the procedure adequately.  A 2 cm hiatal hernia was found.  The proximal extent of the gastric folds (end of tubular esophagus) was 40 cm from the incisors.  The hiatal narrowing was 40 cm from the incisors.  The Z-line was 38 cm from the incisors.  This was a Hill Grade 2 hernia. One non-bleeding cratered gastric ulcer with a flat pigmented spot (Forrest Class IIc) was found in the gastric antrum.  The lesion was 10 mm in largest dimension.  Biopsies were taken from body, antrum and gastric ulcer edges with a cold forceps for Helicobacter pylori testing and histology.  There was some associated melanosis vs debris from oral iron. The examined duodenum was normal. There was some associated melanosis.  RECOMMENDATIONS - Return patient to hospital ward for ongoing care.  - Resume previous diet.  - Await pathology results.  - Repeat upper endoscopy in 3 months for surveillance.  - Use Prilosec (omeprazole) 40 mg PO BID.  - Return to GI clinic in 3-4 weeks. - Continue oral iron. - H. pylori serology. Treat if positive with bismuth quadruple regimen. - GI service will sign-off, please call us back if you have any more questions.  Maylon Peppers, MD Gastroenterology and Hepatology [facility]

## 2022-03-25 NOTE — Telephone Encounter (Signed)
Stacey - Can we please arrange hospital follow up for anemia and GI bleed in 3-4 weeks for this patient.   Venetia Night, MSN, FNP-BC, AGACNP-BC [facility]

## 2022-03-25 NOTE — Anesthesia Procedure Notes (Signed)
Date/Time: 03/25/2022 1:04 PM  Performed by: Vista Deck, CRNAPre-anesthesia Checklist: Patient identified, Emergency Drugs available, Suction available, Timeout performed and Patient being monitored Patient Re-evaluated:Patient Re-evaluated prior to induction Oxygen Delivery Method: Nasal Cannula

## 2022-03-25 NOTE — Progress Notes (Signed)
We will proceed with EGD as scheduled.  I thoroughly discussed with the patient his procedure, including the risks involved. Patient understands what the procedure involves including the benefits and any risks. Patient understands alternatives to the proposed procedure. Risks including (but not limited to) bleeding, tearing of the lining (perforation), rupture of adjacent organs, problems with heart and lung function, infection, and medication reactions. A small percentage of complications may require surgery, hospitalization, repeat endoscopic procedure, and/or transfusion.  Patient understood and agreed.  Yoshito Gaza Castaneda, MD Gastroenterology and Hepatology [facility]  

## 2022-03-25 NOTE — Progress Notes (Signed)
  Transition of Care [facility]) Screening Note   Patient Details  Name: Bryan Atkinson Date of Birth: 03/28/1956   Transition of Care [facility]) CM/SW Contact:    Ihor Gully, LCSW Phone Number: 03/25/2022, 2:55 PM    Transition of Care Department [facility]) has reviewed patient and no TOC needs have been identified at this time. We will continue to monitor patient advancement through interdisciplinary progression rounds. If new patient transition needs arise, please place a TOC consult.

## 2022-03-25 NOTE — Progress Notes (Signed)
Subjective:  Patient has no complaints this morning.  He states he is hungry.  He denies heartburn nausea or vomiting.  He did have nausea before he came in.  He has not had a BM since admission.  He denies abdominal pain.  He is on low-dose aspirin but he states he does not take any other NSAIDs.  He does take Tylenol half a tablet at a time every now and then for musculoskeletal pain.  Current Medications:  Current Facility-Administered Medications:    acetaminophen (TYLENOL) tablet 650 mg, 650 mg, Oral, Q6H PRN **OR** acetaminophen (TYLENOL) suppository 650 mg, 650 mg, Rectal, Q6H PRN, Barton Dubois, MD   insulin aspart (novoLOG) injection 0-5 Units, 0-5 Units, Subcutaneous, QHS, Barton Dubois, MD   insulin aspart (novoLOG) injection 0-9 Units, 0-9 Units, Subcutaneous, TID WC, Barton Dubois, MD   ondansetron [facility]) tablet 4 mg, 4 mg, Oral, Q6H PRN **OR** ondansetron (ZOFRAN) injection 4 mg, 4 mg, Intravenous, Q6H PRN, Barton Dubois, MD   pantoprazole (PROTONIX) injection 40 mg, 40 mg, Intravenous, Q12H, Barton Dubois, MD, 40 mg at 03/24/22 2156   Objective: Blood pressure (!) 129/91, pulse 87, temperature 98.2 F (36.8 C), temperature source Oral, resp. rate 18, height 5' 9"  (1.753 m), weight 104.3 kg, SpO2 99 %. Patient is alert and in no acute distress. Conjunctiva is pink. Sclera is nonicteric Oropharyngeal mucosa is normal. No neck masses or thyromegaly noted. Cardiac exam with regular rhythm normal S1 and S2. No murmur or gallop noted. Lungs are clear to auscultation. Abdomen is full.  On palpation soft and nontender with organomegaly or masses. Trace edema around ankles.  Labs/studies Results:      Latest Ref Rng & Units 03/25/2022    4:44 AM 03/24/2022   10:48 AM 03/04/2022    3:08 PM  CBC  WBC 4.0 - 10.5 K/uL 10.4  11.5  9.4   Hemoglobin 13.0 - 17.0 g/dL 7.7  7.7  10.7   Hematocrit 39.0 - 52.0 % 23.8  23.5  32.1   Platelets 150 - 400 K/uL 253  295  294         Latest Ref Rng & Units 03/25/2022    4:44 AM 03/24/2022   10:48 AM 03/04/2022    3:08 PM  CMP  Glucose 70 - 99 mg/dL 78  152  90   BUN 8 - 23 mg/dL 69  87  50   Creatinine 0.61 - 1.24 mg/dL 2.93  3.38  2.79   Sodium 135 - 145 mmol/L 139  134  140   Potassium 3.5 - 5.1 mmol/L 3.7  3.9  3.8   Chloride 98 - 111 mmol/L 110  100  109   CO2 22 - 32 mmol/L 23  23  23    Calcium 8.9 - 10.3 mg/dL 8.9  9.7  9.5   Total Protein 6.5 - 8.1 g/dL  6.5  7.0   Total Bilirubin 0.3 - 1.2 mg/dL  0.6  0.2   Alkaline Phos 38 - 126 U/L  36  53   AST 15 - 41 U/L  18  31   ALT 0 - 44 U/L  16  26        Latest Ref Rng & Units 03/24/2022   10:48 AM 03/04/2022    3:08 PM 02/04/2022   10:02 AM  Hepatic Function  Total Protein 6.5 - 8.1 g/dL 6.5  7.0    Albumin 3.5 - 5.0 g/dL 3.6  4.0  3.9   AST  15 - 41 U/L 18  31    ALT 0 - 44 U/L 16  26    Alk Phosphatase 38 - 126 U/L 36  53    Total Bilirubin 0.3 - 1.2 mg/dL 0.6  0.2      INR 1.0 yesterday.  Assessment:  #1.  Acute on chronic anemia appears to be due to GI blood loss.  He presents with melena.  Hemoglobin 7.7 on admission and this morning.  He has received 1 unit of PRBCs this morning.  He has history of gastric ulcer back in 2021.  Also was documented to have healed on follow-up exam.  Remains to be seen if he has recurrent peptic ulcer disease or other causes.  If EGD is unremarkable neck step would be small bowel study which would have to be done on an outpatient basis since he has been on p.o. iron until this admission.  #2.  Acute on chronic kidney disease.  Renal function is improving.  #3.  Diabetes mellitus.  A1c on admission 5.3.  Plan:  Esophagogastroduodenoscopy later today. Procedure reviewed with the patient and he is agreeable. CBC in AM.

## 2022-03-25 NOTE — Progress Notes (Signed)
Progress Note   Patient: Bryan Atkinson [id number]:[id number] DOB: 20-Jun-1956 DOA: 03/24/2022     0 DOS: the patient was seen and examined on 03/25/2022   Brief hospital course: Bryan Atkinson is a 66 y.o. male with medical history significant of chronic kidney disease stage IIIb, type 2 diabetes mellitus with nephropathy, hypertension, hyperlipidemia, gastroesophageal reflux disease and prior history of GI bleed.  Presented to the hospital secondary to dizziness/lightheadedness and also as instructed by his primary care doctor after noticing worsening renal function and decreased hemoglobin level.  Patient reports noticing some dark stools intermittently, but expressed that he is on chronic iron supplementation. No chest pain, no fever, no nausea, no hematemesis, no focal weakness, no dysuria, no hematuria, no focal neurologic deficits or sick contacts. In the ED work-up demonstrated creatinine 3.38 (baseline around 2.6-2.7 (with a GFR in the 20s to 30s).  Hemoglobin 7.7 with a prior level in the 9-10 range (patient with history of GI bleed in the past along with anemia of chronic kidney disease, receiving IV iron infusions and also oral iron supplementation).   GI service was consulted by EDP and TRH contacted to place patient in the hospital for further evaluation and management.  Patient is hemodynamically stable.  Assessment and Plan: * Acute on chronic blood loss anemia -Patient's hemoglobin has remained stable at 7.7 after 1 unit of PRBCs. -Hemodynamically stable -Denies chest pain or shortness of breath. -Continue to follow hemoglobin trend and transfuse as needed. -Status post endoscopic evaluation demonstrating nonbleeding gastric ulcer and some melanosis. -GI recommending PPI twice a day and to advance diet.  If able to tolerate we can discharge home in a.m.    Gastrointestinal hemorrhage - With the presence of known bleeding gastric ulcer -H. pylori serology/tissue sample taken  during procedure. -Continue PPI twice a day -Follow hemoglobin trend. -Will advance diet. -GI recommending follow-up in 3 to 4 weeks as an outpatient.  Acute renal failure superimposed on stage 3b chronic kidney disease (Bryan Atkinson) -Patient baseline creatinine in the 2.6-2.7 range with a GFR in the 20-30s. -On presentation creatinine 3.38 with a GFR down to 19 -Will continue holding nephrotoxic agents -Continue to maintain adequate hydration, avoid hypotension and the use of contrast.   -Creatinine trending down and currently 2.93 -Continue to follow renal function trend.  Essential hypertension -Overall blood pressure stable -Continue holding antihypertensive agents in the setting of acute bleeding, symptoms of lightheadedness and worsening renal function. -Follow-up vital signs.  Type 2 diabetes with nephropathy (HCC) - A1c 5.3 -Continue sliding scale insulin while inpatient. -Follow CBGs and adjust hypoglycemia regimen as required.  GERD (gastroesophageal reflux disease) - As mentioned in GI bleed section will continue therapy with Protonix twice a day. -Follow clinical response.    Subjective:  No overnight event; hemodynamically stable.  Denies chest pain, nausea and vomiting.  Patient expressed feeling very hungry.  Tolerated endoscopic evaluation well and has been told that he can have his diet advanced.  Physical Exam: Vitals:   03/25/22 1325 03/25/22 1330 03/25/22 1331 03/25/22 1351  BP: 105/75 125/85 125/85 (!) 150/94  Pulse: 85 84 84 81  Resp: (!) 25 (!) 21 (!) 24 18  Temp: 98 F (36.7 C)  98 F (36.7 C) 98.1 F (36.7 C)  TempSrc:      SpO2: 94% 95% 94% 99%  Weight:      Height:       General exam: Alert, awake, oriented x 3; expressing being very hungry; no nausea,  no vomiting, no chest pain.  Good saturation on room air. Respiratory system: Clear to auscultation. Respiratory effort normal.  No using accessory muscle. Cardiovascular system:RRR. No murmurs,  rubs, gallops.  No JVD. Gastrointestinal system: Abdomen is nondistended, soft and nontender. No organomegaly or masses felt. Normal bowel sounds heard. Central nervous system: Alert and oriented. No focal neurological deficits. Extremities: No cyanosis or clubbing. Skin: No petechiae. Psychiatry: Judgement and insight appear normal. Mood & affect appropriate.   Data Reviewed: CBC: Demonstrating hemoglobin of 7.7, WBCs 10.4 and platelets count 035D Basic metabolic panel with a sodium of 139, potassium 3.7, BUN 69 creatinine 2.93   Family Communication: Mother at bedside.  Disposition: Status is: Observation The patient remains OBS appropriate and will d/c before 2 midnights.  Hopefully discharge in a.m. if remains stable and tolerates diet.  Status post endoscopy with instruction to advance diet and continue PPI twice a day.   Planned Discharge Destination: Home   Author: Barton Dubois, MD 03/25/2022 3:28 PM  For on call review [email or web address].

## 2022-03-25 NOTE — Anesthesia Preprocedure Evaluation (Signed)
Anesthesia Evaluation  Patient identified by MRN, date of birth, ID band Patient awake    Reviewed: Allergy & Precautions, H&P , NPO status , Patient's Chart, lab work & pertinent test results, reviewed documented beta blocker date and time   Airway Mallampati: II  TM Distance: >3 FB Neck ROM: full    Dental no notable dental hx.    Pulmonary neg pulmonary ROS,    Pulmonary exam normal breath sounds clear to auscultation       Cardiovascular Exercise Tolerance: Good hypertension, negative cardio ROS   Rhythm:regular Rate:Normal     Neuro/Psych negative neurological ROS  negative psych ROS   GI/Hepatic Neg liver ROS, PUD, GERD  Medicated,  Endo/Other  negative endocrine ROSdiabetes  Renal/GU ARFRenal disease  negative genitourinary   Musculoskeletal   Abdominal   Peds  Hematology  (+) Blood dyscrasia, anemia ,   Anesthesia Other Findings   Reproductive/Obstetrics negative OB ROS                             Anesthesia Physical Anesthesia Plan  ASA: 3 and emergent  Anesthesia Plan: General   Post-op Pain Management:    Induction:   PONV Risk Score and Plan: Propofol infusion  Airway Management Planned:   Additional Equipment:   Intra-op Plan:   Post-operative Plan:   Informed Consent: I have reviewed the patients History and Physical, chart, labs and discussed the procedure including the risks, benefits and alternatives for the proposed anesthesia with the patient or authorized representative who has indicated his/her understanding and acceptance.     Dental Advisory Given  Plan Discussed with: CRNA  Anesthesia Plan Comments:         Anesthesia Quick Evaluation

## 2022-03-25 NOTE — Progress Notes (Signed)
Pt off unit for procedure.

## 2022-03-25 NOTE — Assessment & Plan Note (Signed)
-  With the presence of non-bleeding gastric ulcer -H. pylori serology/tissue sample taken during procedure; final results will be followed by GI service. -Continue PPI twice a day -Follow hemoglobin trend. -Diet advanced and well-tolerated. -GI recommending follow-up in 3 to 4 weeks as an outpatient.

## 2022-03-26 DIAGNOSIS — K254 Chronic or unspecified gastric ulcer with hemorrhage: Secondary | ICD-10-CM | POA: Diagnosis not present

## 2022-03-26 DIAGNOSIS — N179 Acute kidney failure, unspecified: Secondary | ICD-10-CM | POA: Diagnosis not present

## 2022-03-26 DIAGNOSIS — I10 Essential (primary) hypertension: Secondary | ICD-10-CM | POA: Diagnosis not present

## 2022-03-26 DIAGNOSIS — D62 Acute posthemorrhagic anemia: Secondary | ICD-10-CM | POA: Diagnosis not present

## 2022-03-26 LAB — GLUCOSE, CAPILLARY
Glucose-Capillary: 107 mg/dL — ABNORMAL HIGH (ref 70–99)
Glucose-Capillary: 108 mg/dL — ABNORMAL HIGH (ref 70–99)

## 2022-03-26 LAB — CBC
HCT: 25.8 % — ABNORMAL LOW (ref 39.0–52.0)
Hemoglobin: 8.4 g/dL — ABNORMAL LOW (ref 13.0–17.0)
MCH: 31.3 pg (ref 26.0–34.0)
MCHC: 32.6 g/dL (ref 30.0–36.0)
MCV: 96.3 fL (ref 80.0–100.0)
Platelets: 287 10*3/uL (ref 150–400)
RBC: 2.68 MIL/uL — ABNORMAL LOW (ref 4.22–5.81)
RDW: 13.7 % (ref 11.5–15.5)
WBC: 10 10*3/uL (ref 4.0–10.5)
nRBC: 0 % (ref 0.0–0.2)

## 2022-03-26 LAB — BASIC METABOLIC PANEL
Anion gap: 6 (ref 5–15)
BUN: 45 mg/dL — ABNORMAL HIGH (ref 8–23)
CO2: 23 mmol/L (ref 22–32)
Calcium: 9 mg/dL (ref 8.9–10.3)
Chloride: 109 mmol/L (ref 98–111)
Creatinine, Ser: 2.44 mg/dL — ABNORMAL HIGH (ref 0.61–1.24)
GFR, Estimated: 29 mL/min — ABNORMAL LOW (ref >60)
Glucose, Bld: 107 mg/dL — ABNORMAL HIGH (ref 70–99)
Potassium: 3.9 mmol/L (ref 3.5–5.1)
Sodium: 138 mmol/L (ref 135–145)

## 2022-03-26 MED ORDER — PANTOPRAZOLE SODIUM 40 MG PO TBEC: 40.0000 mg | DELAYED_RELEASE_TABLET | Freq: Two times a day (BID) | ORAL | 3 refills | Status: DC

## 2022-03-26 MED ORDER — AMLODIPINE BESYLATE 5 MG PO TABS: 2.5000 mg | ORAL_TABLET | Freq: Every day | ORAL | Status: AC

## 2022-03-26 MED ORDER — FUROSEMIDE 20 MG PO TABS: 20.0000 mg | ORAL_TABLET | Freq: Two times a day (BID) | ORAL | Status: AC

## 2022-03-26 NOTE — Progress Notes (Signed)
Patient states he has been having dizzy spells throughout his stay. MD [facility] notified.

## 2022-03-26 NOTE — Progress Notes (Signed)
Patient slept this whole shift, during obtaining morning vitals Patient stated "I normally takes Amlodipine and Carvedilol at home for High blood pressure and have not taken any of them while being here." Patient vitals are temp 97.9, Blood pressure 139/85, pulse 80, respirations 17 and Oxygen 97 room air. Dr. Josephine Cables notified.

## 2022-03-26 NOTE — Discharge Summary (Signed)
Physician Discharge Summary   Patient: Bryan Atkinson MRN: [id number] DOB: 03-26-1956  Admit date:     03/24/2022  Discharge date: 03/26/22  Discharge Physician: Barton Dubois   PCP: Everardo Beals, NP   Recommendations at discharge:  Repeat basic metabolic panel to follow electrolytes and renal function Repeat CBC to follow hemoglobin trend/stability Reassess blood pressure and adjust antihypertensive regimen as needed (hydralazine and Cozaar has been discontinued at time of discharge; patient's Norvasc dose cut in half.  Patient was complaining of intermittent episodes of lightheadedness when changing position). Continue to follow CBGs and further adjust hypoglycemia regimen as required. Make sure patient follow-up with nephrology and gastroenterology as instructed.  Discharge Diagnoses: Principal Problem:   Acute on chronic blood loss anemia Active Problems:   Symptomatic anemia   GERD (gastroesophageal reflux disease)   Type 2 diabetes with nephropathy (HCC)   Essential hypertension   Acute renal failure superimposed on stage 3b chronic kidney disease [facility])   Gastrointestinal hemorrhage  Hospital Course: Bryan Atkinson is a 66 y.o. male with medical history significant of chronic kidney disease stage IIIb, type 2 diabetes mellitus with nephropathy, hypertension, hyperlipidemia, gastroesophageal reflux disease and prior history of GI bleed.  Presented to the hospital secondary to dizziness/lightheadedness and also as instructed by his primary care doctor after noticing worsening renal function and decreased hemoglobin level.  Patient reports noticing some dark stools intermittently, but expressed that he is on chronic iron supplementation. No chest pain, no fever, no nausea, no hematemesis, no focal weakness, no dysuria, no hematuria, no focal neurologic deficits or sick contacts. In the ED work-up demonstrated creatinine 3.38 (baseline around 2.6-2.7 (with a GFR in the  20s to 30s).  Hemoglobin 7.7 with a prior level in the 9-10 range (patient with history of GI bleed in the past along with anemia of chronic kidney disease, receiving IV iron infusions and also oral iron supplementation).   GI service was consulted by EDP and [facility] contacted to place patient in the hospital for further evaluation and management.  Patient is hemodynamically stable.    Assessment and Plan: * Acute on chronic blood loss anemia -Patient's hemoglobin has remained stable/improved at 8.4 at time of discharge. -1 unit PRBC transfusion provided during hospitalization at 7.7 after 1 unit of PRBCs. -Hemodynamically stable and not demonstrating overt bleeding. -Patient has tolerated diet without problems. -GI service will follow-up H. pylori serology and tissue sampling. -Continue PPI twice a day. -Status post endoscopic evaluation demonstrating nonbleeding gastric ulcer and some melanosis.   Gastrointestinal hemorrhage -With the presence of non-bleeding gastric ulcer -H. pylori serology/tissue sample taken during procedure; final results will be followed by GI service. -Continue PPI twice a day -Follow hemoglobin trend. -Diet advanced and well-tolerated. -GI recommending follow-up in 3 to 4 weeks as an outpatient.  Acute renal failure superimposed on stage 3b chronic kidney disease ([place]) -Patient baseline creatinine in the 2.6-2.7 range with a GFR in the 20-30s. -On presentation creatinine 3.38 with a GFR down to 19 -Will continue minimizing the use of nephrotoxic agents -Continue to maintain adequate hydration, avoid hypotension and avoid the use of contrast.   -Creatinine trending down and currently 2.44 (baseline for him)  -Continue to follow renal function trend.  Essential hypertension -Overall blood pressure stable -Continue holding hydralazine and Cozaar at time of discharge; safe to resume the rest of his antihypertensive agents at adjusted dose. -Patient advised to  follow heart healthy diet.  -Reassess blood pressure and further adjust medications  as needed.  Type 2 diabetes with nephropathy (HCC) - A1c 5.3 -Resume hypoglycemic regimen and follow-up with PCP at discharge -Continue to check CBGs; patient advised to follow modified carbohydrate diet.  GERD (gastroesophageal reflux disease) - As mentioned in GI bleed section will continue therapy with Protonix twice a day. -Follow clinical response.  Class I obesity -Low calorie diet and portion control discussed with patient -Body mass index is 33.97 kg/m.   Consultants: Gastroenterology service Procedures performed: See below for x-ray report; EGD Disposition: Home Diet recommendation: Heart healthy modified carbohydrate diet.   DISCHARGE MEDICATION: Allergies as of 03/26/2022   No Known Allergies      Medication List     STOP taking these medications    hydrALAZINE 100 MG tablet Commonly known as: APRESOLINE   losartan 25 MG tablet Commonly known as: COZAAR       TAKE these medications    acetaminophen 500 MG tablet Commonly known as: TYLENOL Take 500 mg by mouth every 6 (six) hours as needed for mild pain.   allopurinol 300 MG tablet Commonly known as: ZYLOPRIM Take 300 mg by mouth daily.   amLODipine 5 MG tablet Commonly known as: NORVASC Take 0.5 tablets (2.5 mg total) by mouth daily. Start taking on: March 28, 2022 What changed:  how much to take These instructions start on March 28, 2022. If you are unsure what to do until then, ask your doctor or other care provider.   Apple Cider Vinegar 500 MG Tabs Take 500 mg by mouth daily.   aspirin EC 81 MG tablet Take 81 mg by mouth daily. Swallow whole.   carvedilol 25 MG tablet Commonly known as: COREG Take 25 mg by mouth 2 (two) times daily.   Co-Enzyme Q10 100 MG Caps Take 100 mg by mouth daily.   Cranberry 500 MG Tabs Take 500 mg by mouth daily.   Farxiga 5 MG Tabs tablet Generic drug: dapagliflozin  propanediol Take 5 mg by mouth every morning.   fenofibrate 145 MG tablet Commonly known as: TRICOR Take 145 mg by mouth daily.   ferrous sulfate 325 (65 FE) MG tablet Take 325 mg by mouth daily with breakfast.   Flaxseed Oil 1000 MG Caps Take 1,000 mg by mouth daily.   furosemide 20 MG tablet Commonly known as: LASIX Take 1 tablet (20 mg total) by mouth 2 (two) times daily. Start taking on: March 27, 2022   glipiZIDE 10 MG 24 hr tablet Commonly known as: GLUCOTROL XL Take 10 mg by mouth daily as needed (If blood sugar is > 130).   glucose blood test strip 1 each by Other route as needed for other. Use as instructed   Krill Oil 500 MG Caps Take 500 mg by mouth 2 (two) times a day.   multivitamin tablet Take 1 tablet by mouth daily.   Niacin CR 1000 MG Tbcr Take 1,000 mg by mouth every evening.   pantoprazole 40 MG tablet Commonly known as: Protonix Take 1 tablet (40 mg total) by mouth 2 (two) times daily.   pravastatin 40 MG tablet Commonly known as: PRAVACHOL Take 40 mg by mouth at bedtime.   traZODone 50 MG tablet Commonly known as: DESYREL Take 50 mg by mouth at bedtime.   triamcinolone cream 0.1 % Commonly known as: KENALOG Apply 1 application topically 2 (two) times daily as needed (rash).   Turmeric 500 MG Caps Take 500 mg by mouth daily.        Follow-up Information  Everardo Beals, NP. Schedule an appointment as soon as possible for a visit in 10 day(s).   Contact information: [place] [address] [phone]                Discharge Exam: Filed Weights   03/24/22 1031 03/24/22 1826  Weight: 105.2 kg 104.3 kg   General exam: Alert, awake, oriented x 3; no overt bleeding; no acute distress.  Feeling ready to go home. Respiratory system: Clear to auscultation. Respiratory effort normal.  No using accessory muscles.  Good saturation on room air. Cardiovascular system:RRR. No murmurs, rubs,  gallops. Gastrointestinal system: Abdomen is nondistended, soft and nontender. No organomegaly or masses felt. Normal bowel sounds heard. Central nervous system: Alert and oriented. No focal neurological deficits. Extremities: No C/C/E, +pedal pulses Skin: No rashes, lesions or ulcers Psychiatry: Judgement and insight appear normal. Mood & affect appropriate.    Condition at discharge: Stable and improved.  The results of significant diagnostics from this hospitalization (including imaging, microbiology, ancillary and laboratory) are listed below for reference.   Imaging Studies: No results found.  Microbiology: Results for orders placed or performed during the hospital encounter of 09/16/20  SARS CORONAVIRUS 2 (TAT 6-24 HRS) Nasopharyngeal Nasopharyngeal Swab     Status: None   Collection Time: 09/16/20  8:30 AM   Specimen: Nasopharyngeal Swab  Result Value Ref Range Status   SARS Coronavirus 2 NEGATIVE NEGATIVE Final    Comment: (NOTE) SARS-CoV-2 target nucleic acids are NOT DETECTED.  The SARS-CoV-2 RNA is generally detectable in upper and lower respiratory specimens during the acute phase of infection. Negative results do not preclude SARS-CoV-2 infection, do not rule out co-infections with other pathogens, and should not be used as the sole basis for treatment or other patient management decisions. Negative results must be combined with clinical observations, patient history, and epidemiological information. The expected result is Negative.  Fact Sheet for Patients: SugarRoll.be  Fact Sheet for Healthcare Providers: https://www.woods-mathews.com/  This test is not yet approved or cleared by the [place] FDA and  has been authorized for detection and/or diagnosis of SARS-CoV-2 by FDA under an Emergency Use Authorization (EUA). This EUA will remain  in effect (meaning this test can be used) for the duration of the COVID-19  declaration under Se ction 564(b)(1) of the Act, 21 U.S.C. section 360bbb-3(b)(1), unless the authorization is terminated or revoked sooner.  Performed at [facility], [place] [address], [place], [place] [address]     Labs: CBC: Recent Labs  Lab 03/24/22 1048 03/25/22 0444 03/26/22 0745  WBC 11.5* 10.4 10.0  NEUTROABS 7.9*  --   --   HGB 7.7* 7.7* 8.4*  HCT 23.5* 23.8* 25.8*  MCV 96.7 97.5 96.3  PLT 295 253 962   Basic Metabolic Panel: Recent Labs  Lab 03/24/22 1048 03/25/22 0444 03/26/22 0745  NA 134* 139 138  K 3.9 3.7 3.9  CL 100 110 109  CO2 23 23 23   GLUCOSE 152* 78 107*  BUN 87* 69* 45*  CREATININE 3.38* 2.93* 2.44*  CALCIUM 9.7 8.9 9.0   Liver Function Tests: Recent Labs  Lab 03/24/22 1048  AST 18  ALT 16  ALKPHOS 36*  BILITOT 0.6  PROT 6.5  ALBUMIN 3.6   CBG: Recent Labs  Lab 03/25/22 1331 03/25/22 1646 03/25/22 2205 03/26/22 0716 03/26/22 1139  GLUCAP 95 117* 135* 107* 108*    Discharge time spent: greater than 30 minutes.  Signed: Barton Dubois, MD  [facility] 03/26/2022

## 2022-03-27 NOTE — Op Note (Addendum)
[facility] Patient Name: Bryan Atkinson Procedure Date: 03/25/2022 12:57 PM MRN: [id number] Date of Birth: [birth year] Attending MD: Maylon Peppers ,  CSN: [id number] Age: 66 Admit Type: Outpatient Procedure:                Upper GI endoscopy Indications:              Iron deficiency anemia, Melena Providers:                Maylon Peppers, Lurline Del, RN, Casimer Bilis, Technician Referring MD:              Medicines:                Monitored Anesthesia Care Complications:            No immediate complications. Estimated Blood Loss:     Estimated blood loss: none. Procedure:                Pre-Anesthesia Assessment:                           - Prior to the procedure, a History and Physical                            was performed, and patient medications, allergies                            and sensitivities were reviewed. The patient's                            tolerance of previous anesthesia was reviewed.                           - The risks and benefits of the procedure and the                            sedation options and risks were discussed with the                            patient. All questions were answered and informed                            consent was obtained.                           - ASA Grade Assessment: II - A patient with mild                            systemic disease.                           After obtaining informed consent, the endoscope was                            passed under direct vision. Throughout the  procedure, the patient's blood pressure, pulse, and                            oxygen saturations were monitored continuously. The                            GIF-H190 ([id number]) scope was introduced through the                            mouth, and advanced to the second part of duodenum.                            The upper GI endoscopy was accomplished without                             difficulty. The patient tolerated the procedure                            well. Scope In: 1:09:08 PM Scope Out: 1:18:22 PM Total Procedure Duration: 0 hours 9 minutes 14 seconds  Findings:      A 2 cm hiatal hernia was found. The proximal extent of the gastric folds       (end of tubular esophagus) was 40 cm from the incisors. The hiatal       narrowing was 40 cm from the incisors. The Z-line was 38 cm from the       incisors. This was a Hill Grade 2 hernia.      One non-bleeding cratered gastric ulcer with a flat pigmented spot       (Forrest Class IIc) was found in the gastric antrum. The lesion was 10       mm in largest dimension. Biopsies were taken from body, antrum and       gastric ulcer edges with a cold forceps for Helicobacter pylori testing       and histology. There was some associated melanosis vs debris from oral       iron.      The examined duodenum was normal. There was some associated melanosis. Impression:               - 2 cm hiatal hernia.                           - Non-bleeding gastric ulcer with a flat pigmented                            spot (Forrest Class IIc). Biopsied.                           - Normal examined duodenum. Moderate Sedation:      Per Anesthesia Care Recommendation:           - Return patient to hospital ward for ongoing care.                           - Resume previous diet.                           -  Await pathology results.                           - Repeat upper endoscopy in 3 months for                            surveillance.                           - Use Prilosec (omeprazole) 40 mg PO BID.                           - Return to GI clinic in 3-4 weeks.                           - Continue oral iron.                           - H. pylori serology. Treat if positive with                            bismuth quadruple regimen. Procedure Code(s):        --- Professional ---                           [phone],  Esophagogastroduodenoscopy, flexible,                            transoral; with biopsy, single or multiple Diagnosis Code(s):        --- Professional ---                           K44.9, Diaphragmatic hernia without obstruction or                            gangrene                           K25.9, Gastric ulcer, unspecified as acute or                            chronic, without hemorrhage or perforation                           D50.9, Iron deficiency anemia, unspecified                           K92.1, Melena (includes Hematochezia) CPT copyright 2019 American Medical Association. All rights reserved. The codes documented in this report are preliminary and upon coder review may  be revised to meet current compliance requirements. Maylon Peppers, MD Maylon Peppers,  03/25/2022 1:27:57 PM This report has been signed electronically. Number of Addenda: 0

## 2022-03-27 NOTE — Anesthesia Postprocedure Evaluation (Signed)
Anesthesia Post Note  Patient: Bryan Atkinson  Procedure(s) Performed: ESOPHAGOGASTRODUODENOSCOPY (EGD) WITH PROPOFOL BIOPSY  Patient location during evaluation: Phase II Anesthesia Type: General Level of consciousness: awake Pain management: pain level controlled Vital Signs Assessment: post-procedure vital signs reviewed and stable Respiratory status: spontaneous breathing and respiratory function stable Cardiovascular status: blood pressure returned to baseline and stable Postop Assessment: no headache and no apparent nausea or vomiting Anesthetic complications: no Comments: Late entry   No notable events documented.   Last Vitals:  Vitals:   03/25/22 2211 03/26/22 0512  BP: (!) 137/94 139/85  Pulse: 81 80  Resp: 20 17  Temp: 36.7 C 36.6 C  SpO2: 98% 97%    Last Pain:  Vitals:   03/26/22 0900  TempSrc:   PainSc: 0-No pain                 Louann Sjogren

## 2022-03-29 ENCOUNTER — Encounter: Payer: Self-pay | Admitting: Internal Medicine

## 2022-03-29 LAB — SURGICAL PATHOLOGY

## 2022-03-29 LAB — H. PYLORI ANTIBODY, IGG: H Pylori IgG: 0.7 Index Value (ref 0.00–0.79)

## 2022-04-01 ENCOUNTER — Encounter (HOSPITAL_COMMUNITY): Payer: Self-pay | Admitting: Gastroenterology

## 2022-04-14 ENCOUNTER — Other Ambulatory Visit (HOSPITAL_COMMUNITY)
Admission: RE | Admit: 2022-04-14 | Discharge: 2022-04-14 | Disposition: A | Discharge status: Home / Self Care | Payer: 59 | Attending: Nephrology | Admitting: Nephrology

## 2022-04-14 DIAGNOSIS — D649 Anemia, unspecified: Secondary | ICD-10-CM | POA: Diagnosis present

## 2022-04-14 LAB — CBC
HCT: 33.5 % — ABNORMAL LOW (ref 39.0–52.0)
Hemoglobin: 10.9 g/dL — ABNORMAL LOW (ref 13.0–17.0)
MCH: 31.6 pg (ref 26.0–34.0)
MCHC: 32.5 g/dL (ref 30.0–36.0)
MCV: 97.1 fL (ref 80.0–100.0)
Platelets: 307 10*3/uL (ref 150–400)
RBC: 3.45 MIL/uL — ABNORMAL LOW (ref 4.22–5.81)
RDW: 14.6 % (ref 11.5–15.5)
WBC: 8.6 10*3/uL (ref 4.0–10.5)
nRBC: 0 % (ref 0.0–0.2)

## 2022-04-14 LAB — IRON AND TIBC
Iron: 40 ug/dL — ABNORMAL LOW (ref 45–182)
Saturation Ratios: 11 % — ABNORMAL LOW (ref 17.9–39.5)
TIBC: 367 ug/dL (ref 250–450)
UIBC: 327 ug/dL

## 2022-04-14 LAB — RENAL FUNCTION PANEL
Albumin: 3.8 g/dL (ref 3.5–5.0)
Anion gap: 8 (ref 5–15)
BUN: 48 mg/dL — ABNORMAL HIGH (ref 8–23)
CO2: 23 mmol/L (ref 22–32)
Calcium: 9.4 mg/dL (ref 8.9–10.3)
Chloride: 107 mmol/L (ref 98–111)
Creatinine, Ser: 2.95 mg/dL — ABNORMAL HIGH (ref 0.61–1.24)
GFR, Estimated: 23 mL/min — ABNORMAL LOW (ref >60)
Glucose, Bld: 129 mg/dL — ABNORMAL HIGH (ref 70–99)
Phosphorus: 2.8 mg/dL (ref 2.5–4.6)
Potassium: 3.5 mmol/L (ref 3.5–5.1)
Sodium: 138 mmol/L (ref 135–145)

## 2022-04-14 LAB — PROTEIN / CREATININE RATIO, URINE
Creatinine, Urine: 143.25 mg/dL
Protein Creatinine Ratio: 0.13 mg/mg{Cre} (ref 0.00–0.15)
Total Protein, Urine: 19 mg/dL

## 2022-04-14 LAB — FERRITIN: Ferritin: 161 ng/mL (ref 24–336)

## 2022-04-15 LAB — PTH, INTACT AND CALCIUM
Calcium, Total (PTH): 9.5 mg/dL (ref 8.6–10.2)
PTH: 46 pg/mL (ref 15–65)

## 2022-04-22 ENCOUNTER — Other Ambulatory Visit (HOSPITAL_COMMUNITY): Payer: Self-pay | Admitting: Physician Assistant

## 2022-04-22 NOTE — Progress Notes (Signed)
Since his last visit, patient was hospitalized in June 2023 for acute blood loss anemia.  Notified by nephrologist (Dr. Theador Hawthorne) that his iron levels are low.  We will schedule for IV Venofer 300 mg x 3.  Recheck labs and follow-up as previously scheduled.

## 2022-05-02 ENCOUNTER — Inpatient Hospital Stay (HOSPITAL_COMMUNITY): Payer: 59 | Attending: Hematology

## 2022-05-02 VITALS — BP 130/86 | HR 89 | Temp 97.8°F | Resp 18

## 2022-05-02 DIAGNOSIS — D631 Anemia in chronic kidney disease: Secondary | ICD-10-CM | POA: Diagnosis present

## 2022-05-02 DIAGNOSIS — N184 Chronic kidney disease, stage 4 (severe): Secondary | ICD-10-CM | POA: Insufficient documentation

## 2022-05-02 DIAGNOSIS — D509 Iron deficiency anemia, unspecified: Secondary | ICD-10-CM | POA: Diagnosis not present

## 2022-05-02 DIAGNOSIS — D649 Anemia, unspecified: Secondary | ICD-10-CM

## 2022-05-02 DIAGNOSIS — D500 Iron deficiency anemia secondary to blood loss (chronic): Secondary | ICD-10-CM

## 2022-05-02 MED ORDER — SODIUM CHLORIDE 0.9 % IV SOLN
300.0000 mg | Freq: Once | INTRAVENOUS | Status: AC
  Administered 2022-05-02: 300 mg via INTRAVENOUS
  Filled 2022-05-02: qty 300

## 2022-05-02 MED ORDER — ACETAMINOPHEN 325 MG PO TABS
650.0000 mg | ORAL_TABLET | Freq: Once | ORAL | Status: AC
  Administered 2022-05-02: 650 mg via ORAL
  Filled 2022-05-02: qty 2

## 2022-05-02 MED ORDER — SODIUM CHLORIDE 0.9 % IV SOLN: Freq: Once | INTRAVENOUS | Status: AC

## 2022-05-02 MED ORDER — LORATADINE 10 MG PO TABS
10.0000 mg | ORAL_TABLET | Freq: Once | ORAL | Status: AC
  Administered 2022-05-02: 10 mg via ORAL
  Filled 2022-05-02: qty 1

## 2022-05-02 NOTE — Progress Notes (Unsigned)
GI Office Note    Referring Provider: Everardo Beals, NP Primary Care Physician:  Everardo Beals, NP Primary GI: Dr. Gala Romney  Date:  05/03/2022  ID:  Gavin Pound Merlo, DOB 12-26-1955, MRN [id number]   Chief Complaint   Chief Complaint  Patient presents with   Follow-up    History of Present Illness  Muhamed Luecke is a 66 y.o. male presenting today with a history of IDA, gastric ulcer, CKD stage IV with associated anemia of chronic disease.  Patient follows with nephrology and hematology, intermittently receives IV iron.Patient presenting today for hospital follow-up.  Last seen in the office December 2022.  Patient reported feeling well overall.  He had recent lab work completed in September 2022 with stable iron panel, hemoglobin 10.7.  Hemoglobin 11.4 in October 2022.  Patient noted some constipation well managed with prune juice.  He denies any abdominal pain, melena, BRBPR, heartburn or reflux.  Patient requested to wean off pantoprazole.  States he has no other been taking NSAIDs only daily 81 mg aspirin.  He has advised to stop PPI and continue to monitor for symptoms.  Continue to avoid NSAIDs.  He requested to follow-up in 2 years and to continue to follow with hematology for his anemia.  Last colonoscopy 2019 with diverticulosis.  5-year surveillance recommended due to family history of colon cancer in his mother.  EGD completed August 2021 with 2 gastric ulcers, biopsy negative for H. pylori.  Ulcer felt to be secondary to NSAIDs.  Follow-up EGD completed December 2021 showed gastric scar s/p healed ulcer.  Patient recently admitted to the hospital in early June 2023.  He presented with acute on chronic anemia and heme positive stool at the recommendation of Dr. Theador Hawthorne.  He reported recent sticky/tarry black stools.  He was taking oral iron.  He did note some early satiety but denies lack of appetite.  Reported occasional heartburn taking over-the-counter  antacids.  He noted dizziness and fatigue over the past couple weeks prior to admission.  He had recent lab work that showed drop in hemoglobin and worsening renal function and was advised to go to the ED.  On admission his hemoglobin was 7.7 which was a three-point drop from 2 weeks prior.  Stool was Hemoccult positive on exam.  He underwent EGD on 6/23 revealing 2 cm hiatal hernia, nonbleeding gastric ulcer with flat pigmented spot s/p biopsy, normal duodenum.  Stomach ulcer biopsy revealed benign gastric ulcer with reparative changes and granulation tissue negative for H. pylori.  Random stomach biopsy with gastric mucosa with minimal vascular ectasia.  He was advised to continue oral iron, restart omeprazole 40 mg p.o. twice daily and repeat EGD in 3 months.  H. pylori serology negative.  Hemoglobin 8.4 on discharge.  Most recent lab work with hemoglobin 10.9, iron 40, saturation 11%, ferritin 161 on 04/14/2022.  Creatinine stable near patient's baseline.  Patient seen by nephrology on 04/21/2022.  He had labs performed.  Due to recent low iron nephrology reached out to hematology and patient was scheduled for IV Venofer 300 mg x 3.  Patient received 1 infusion yesterday 05/02/2022.    Today: Does have a lot of stress going on recently at work and at home. Does not drink alcohol regularly, does not smoke. Denies frequent NSAID use. No recent steroid use. Has been taking his PPI BID at home. Has not seen any melena or BRBPR. Denies dysphagia, denies typical reflux symptoms.   Just got his first iron infusion  of the 3 that was ordered. Fatigue is his main complaint. Denies shortness of breath and chest pain. No PICA right now.    Past Medical History:  Diagnosis Date   Arthritis    Diabetes mellitus without complication ([place])    Gout    History of kidney stones    Hyperlipidemia    Hypertension     Past Surgical History:  Procedure Laterality Date   BIOPSY  05/25/2020   Procedure: BIOPSY;   Surgeon: Daneil Dolin, MD;  [facility];  Service: Endoscopy;;   BIOPSY  03/25/2022   Procedure: BIOPSY;  Surgeon: Harvel Quale, MD;  [facility];  Service: Gastroenterology;;   CATARACT EXTRACTION W/PHACO Right 05/13/2019   Procedure: CATARACT EXTRACTION PHACO AND INTRAOCULAR LENS PLACEMENT ([place]);  Surgeon: Baruch Goldmann, MD;  Location: [facility];  Service: Ophthalmology;  Laterality: Right;  CDE: 10.31   CATARACT EXTRACTION W/PHACO Left 05/27/2019   Procedure: CATARACT EXTRACTION PHACO AND INTRAOCULAR LENS PLACEMENT ([facility]);  Surgeon: Baruch Goldmann, MD;  Location: [facility];  Service: Ophthalmology;  Laterality: Left;  CDE: 6.49   COLONOSCOPY     times two 2002 ,2007   COLONOSCOPY N/A 10/07/2013   Procedure: COLONOSCOPY;  Surgeon: Daneil Dolin, MD;  [facility];  Service: Endoscopy;  Laterality: N/A;  8:30 AM   COLONOSCOPY N/A 10/05/2018   Dr. Gala Romney: Diverticulosis.  Next colonoscopy 5 years due to first-degree relative with colon cancer   ESOPHAGOGASTRODUODENOSCOPY (EGD) WITH PROPOFOL N/A 05/25/2020   [name]: 2 gastric ulcers.  No H. pylori.  Likely NSAID related.  Repeat EGD in 3 months   ESOPHAGOGASTRODUODENOSCOPY (EGD) WITH PROPOFOL N/A 09/17/2020   Procedure: ESOPHAGOGASTRODUODENOSCOPY (EGD) WITH PROPOFOL;  Surgeon: Daneil Dolin, MD;  [facility];  Service: Endoscopy;  Laterality: N/A;  10:00am   ESOPHAGOGASTRODUODENOSCOPY (EGD) WITH PROPOFOL N/A 03/25/2022   Procedure: ESOPHAGOGASTRODUODENOSCOPY (EGD) WITH PROPOFOL;  Surgeon: Harvel Quale, MD;  [facility];  Service: Gastroenterology;  Laterality: N/A;   JOINT REPLACEMENT     KNEE ARTHROPLASTY Left 06/27/2019   Procedure: COMPUTER ASSISTED TOTAL KNEE ARTHROPLASTY;  Surgeon: Rod Can, MD;  Location: [facility];  Service: Orthopedics;  Laterality: Left;   KNEE ARTHROPLASTY Right 08/15/2019   Procedure: COMPUTER ASSISTED TOTAL KNEE ARTHROPLASTY;  Surgeon:  Rod Can, MD;  Location: [facility];  Service: Orthopedics;  Laterality: Right;   LEG SURGERY Right early 80's    Current Outpatient Medications  Medication Sig Dispense Refill   acetaminophen (TYLENOL) 500 MG tablet Take 500 mg by mouth every 6 (six) hours as needed for mild pain.     allopurinol (ZYLOPRIM) 300 MG tablet Take 300 mg by mouth daily.     amLODipine (NORVASC) 5 MG tablet Take 0.5 tablets (2.5 mg total) by mouth daily.     Apple Cider Vinegar 500 MG TABS Take 500 mg by mouth daily.      aspirin EC 81 MG tablet Take 81 mg by mouth daily. Swallow whole.     carvedilol (COREG) 25 MG tablet Take 25 mg by mouth 2 (two) times daily.     Co-Enzyme Q10 100 MG CAPS Take 100 mg by mouth daily.      Cranberry 500 MG TABS Take 500 mg by mouth daily.     FARXIGA 5 MG TABS tablet Take 5 mg by mouth every morning.     fenofibrate (TRICOR) 145 MG tablet Take 145 mg by mouth daily.  ferrous sulfate 325 (65 FE) MG tablet Take 325 mg by mouth daily with breakfast.     Flaxseed, Linseed, (FLAXSEED OIL) 1000 MG CAPS Take 1,000 mg by mouth daily.     furosemide (LASIX) 20 MG tablet Take 1 tablet (20 mg total) by mouth 2 (two) times daily.     glipiZIDE (GLUCOTROL XL) 10 MG 24 hr tablet Take 10 mg by mouth daily as needed (If blood sugar is > 130).      glucose blood test strip 1 each by Other route as needed for other. Use as instructed     Krill Oil 500 MG CAPS Take 500 mg by mouth 2 (two) times a day.     Multiple Vitamin (MULTIVITAMIN) tablet Take 1 tablet by mouth daily.     Niacin CR 1000 MG TBCR Take 1,000 mg by mouth every evening.     pantoprazole (PROTONIX) 40 MG tablet Take 1 tablet (40 mg total) by mouth 2 (two) times daily. 60 tablet 3   pravastatin (PRAVACHOL) 40 MG tablet Take 40 mg by mouth at bedtime.      traZODone (DESYREL) 50 MG tablet Take 50 mg by mouth at bedtime.      Turmeric 500 MG CAPS Take 500 mg by mouth daily.     triamcinolone cream (KENALOG) 0.1 % Apply 1  application topically 2 (two) times daily as needed (rash).  (Patient not taking: Reported on 05/03/2022)     No current facility-administered medications for this visit.    Allergies as of 05/03/2022   (No Known Allergies)    Family History  Problem Relation Age of Onset   Colon cancer Mother        She died from her malignancy, 17   Lung cancer Father        Died from lung cancer, age 39   Emphysema Brother        Died at 9 from lung disease   COPD Brother    Dementia Sister    Healthy Son    Healthy Daughter     Social History   Socioeconomic History   Marital status: Married    Spouse name: Not on file   Number of children: 2   Years of education: Not on file   Highest education level: Not on file  Occupational History   Occupation: Truck Geophysicist/field seismologist    Comment: runs a Higher education careers adviser as well, [employer] in [place]  Tobacco Use   Smoking status: Never   Smokeless tobacco: Never  Vaping Use   Vaping Use: Never used  Substance and Sexual Activity   Alcohol use: Yes    Comment: occasionally   Drug use: No   Sexual activity: Yes  Other Topics Concern   Not on file  Social History Narrative   Daughter is 89, Son is 27.  3 grandchildren. Married for 34 years   Social Determinants of Radio broadcast assistant Strain: Not on file  Food Insecurity: Not on file  Transportation Needs: Not on file  Physical Activity: Not on file  Stress: Not on file  Social Connections: Not on file     Review of Systems   Gen: Denies fever, chills, anorexia. Denies fatigue, weakness, weight loss.  CV: Denies chest pain, palpitations, syncope, peripheral edema, and claudication. Resp: Denies dyspnea at rest, cough, wheezing, coughing up blood, and pleurisy. GI: See HPI Derm: Denies rash, itching, dry skin Psych: Denies depression, anxiety, memory loss, confusion. No homicidal or suicidal  ideation.  Heme: Denies bruising, bleeding, and enlarged lymph nodes.   Physical Exam   BP  (!) 143/73   Pulse 99   Temp 99 F (37.2 C) (Temporal)   Ht 5\' 9"  (1.753 m)   Wt 236 lb 6.4 oz (107.2 kg)   BMI 34.91 kg/m   General:   Alert and oriented. No distress noted. Pleasant and cooperative.  Head:  Normocephalic and atraumatic. Eyes:  Conjuctiva clear without scleral icterus. Lungs:  Clear to auscultation bilaterally. No wheezes, rales, or rhonchi. No distress.  Heart:  S1, S2 present without murmurs appreciated.  Abdomen:  +BS, soft, rounded, non-tender and non-distended. No rebound or guarding. No HSM or masses noted. Rectal: deferred Msk:  Symmetrical without gross deformities. Normal posture. Extremities:  Without edema. Neurologic:  Alert and  oriented x4 Psych:  Alert and cooperative. Normal mood and affect.   Assessment  Acel Natzke is a 66 y.o. male with a history of IDA requiring IV iron, gastric ulcer, CKD stage IV with associated anemia of chronic disease, diabetes, HLD, HTN presenting today for hospital follow-up.  GERD/Gastric ulcer: History of 2 gastric ulcers noted on EGD in August 2021, felt likely to be secondary to chronic NSAID use.  He had follow-up EGD in December 2021 noting healing of gastric ulcers with scarring.  Patient was seen in the office December 2022 at which time he denied any NSAID use and his hemoglobin was stable.  He requested to come off of PPI and monitor for symptoms.  He also requested to not follow-up with GI for 2 years until time to repeat colonoscopy and follow-up with hematology regarding anemia.  Patient admitted early June 2023 due to worsening anemia and kidney function as well as worsening fatigue.  Hemoglobin found to be 7.7 on admission.  Patient underwent EGD revealing nonbleeding gastric ulcer with reparative changes noted on biopsy.  He was treated with IV PPI while inpatient and advised to begin omeprazole 40 mg twice daily on discharge.  Was recommended for him to have repeat EGD in 3 months (about September).  We  will schedule for repeat EGD to evaluate for healing of gastric ulcer.  Advised patient to continue PPI twice daily.  May consider reducing to daily dosing if evidence of healing on EGD.  Advised patient that he likely will need to stay on PPI chronically to prevent this from occurring in the future.  Anemia of chronic disease/IDA: Recent hemoglobin 10.9 (stable at baseline) with decreased iron and normal ferritin on 04/14/2022.  Patient has history of receiving IV iron in the past.  IDA likely in the setting of prior GI ulcerations as well as kidney disease.  EGD with nonbleeding gastric ulcer as stated above in early June.  Received dose of IV iron yesterday due to most recent labs.  He continues to follow with hematology and nephrology regarding his anemia.  He will have 2 more IV iron infusions.  Discussed he may need regular IV iron infusions ongoing in the future as his most recent drop was likely due to gastric ulcer but has had ongoing needs for iron due to CKD.  PLAN    Proceed with upper endoscopy with propofol by Dr. Gala Romney in near future: the risks, benefits, and alternatives have been discussed with the patient in detail. The patient states understanding and desires to proceed.  ASA 3 (place on September schedule) Continue omeprazole 40 mg twice daily Continue IV iron per hematology Continue to monitor H/H. Follow-up  1 month post procedure, okay for virtual.    Venetia Night, MSN, FNP-BC, AGACNP-BC [facility]

## 2022-05-02 NOTE — Patient Instructions (Signed)
[facility]  Discharge Instructions: Thank you for choosing [facility] to provide your oncology and hematology care.  If you have a lab appointment with the Cancer Center, please come in thru the Main Entrance and check in at the main information desk.  Wear comfortable clothing and clothing appropriate for easy access to any Portacath or PICC line.   We strive to give you quality time with your provider. You may need to reschedule your appointment if you arrive late (15 or more minutes).  Arriving late affects you and other patients whose appointments are after yours.  Also, if you miss three or more appointments without notifying the office, you may be dismissed from the clinic at the provider's discretion.      For prescription refill requests, have your pharmacy contact our office and allow 72 hours for refills to be completed.    Today you received Venofer IV iron infusion.     BELOW ARE SYMPTOMS THAT SHOULD BE REPORTED IMMEDIATELY: *FEVER GREATER THAN 100.4 F (38 C) OR HIGHER *CHILLS OR SWEATING *NAUSEA AND VOMITING THAT IS NOT CONTROLLED WITH YOUR NAUSEA MEDICATION *UNUSUAL SHORTNESS OF BREATH *UNUSUAL BRUISING OR BLEEDING *URINARY PROBLEMS (pain or burning when urinating, or frequent urination) *BOWEL PROBLEMS (unusual diarrhea, constipation, pain near the anus) TENDERNESS IN MOUTH AND THROAT WITH OR WITHOUT PRESENCE OF ULCERS (sore throat, sores in mouth, or a toothache) UNUSUAL RASH, SWELLING OR PAIN  UNUSUAL VAGINAL DISCHARGE OR ITCHING   Items with * indicate a potential emergency and should be followed up as soon as possible or go to the Emergency Department if any problems should occur.  Please show the CHEMOTHERAPY ALERT CARD or IMMUNOTHERAPY ALERT CARD at check-in to the Emergency Department and triage nurse.  Should you have questions after your visit or need to cancel or reschedule your appointment, please contact [facility]  [phone]  and follow the prompts.  Office hours are 8:00 a.m. to 4:30 p.m. Monday - Friday. Please note that voicemails left after 4:00 p.m. may not be returned until the following business day.  We are closed weekends and major holidays. You have access to a nurse at all times for urgent questions. Please call the main number to the clinic [phone] and follow the prompts.  For any non-urgent questions, you may also contact your provider using MyChart. We now offer e-Visits for anyone 18 and older to request care online for non-urgent symptoms. For details visit [email or web address].   Also download the MyChart app! Go to the app store, search "MyChart", open the app, select [facility], and log in with your MyChart username and password.  Masks are optional in the cancer centers. If you would like for your care team to wear a mask while they are taking care of you, please let them know. For doctor visits, patients may have with them one support person who is at least 64 years old. At this time, visitors are not allowed in the infusion area.  

## 2022-05-02 NOTE — Progress Notes (Signed)
Pt presents today for Venofer IV iron per provider's order. Vital signs stable and pt voiced no new complaints at this time.  Peripheral IV started with good blood return pre and post infusion.  Venofer 300 mg  given today per MD orders. Tolerated infusion without adverse affects. Vital signs stable. No complaints at this time. Discharged from clinic ambulatory in stable condition. Alert and oriented x 3. F/U with [facility] as scheduled.   

## 2022-05-03 ENCOUNTER — Ambulatory Visit (INDEPENDENT_AMBULATORY_CARE_PROVIDER_SITE_OTHER): Payer: 59 | Admitting: Gastroenterology

## 2022-05-03 ENCOUNTER — Encounter: Payer: Self-pay | Admitting: Gastroenterology

## 2022-05-03 ENCOUNTER — Encounter: Payer: Self-pay | Admitting: *Deleted

## 2022-05-03 VITALS — BP 143/73 | HR 99 | Temp 99.0°F | Ht 69.0 in | Wt 236.4 lb

## 2022-05-03 DIAGNOSIS — K219 Gastro-esophageal reflux disease without esophagitis: Secondary | ICD-10-CM | POA: Diagnosis not present

## 2022-05-03 DIAGNOSIS — D638 Anemia in other chronic diseases classified elsewhere: Secondary | ICD-10-CM

## 2022-05-03 DIAGNOSIS — K253 Acute gastric ulcer without hemorrhage or perforation: Secondary | ICD-10-CM | POA: Diagnosis not present

## 2022-05-03 DIAGNOSIS — D500 Iron deficiency anemia secondary to blood loss (chronic): Secondary | ICD-10-CM | POA: Diagnosis not present

## 2022-05-03 NOTE — Patient Instructions (Signed)
Reschedule due for repeat upper endoscopy with Dr. Gala Romney in the near future.  Recommend this to further evaluate her gastric ulcer and to ensure healing.  For now continue her pantoprazole 40 mg twice daily.  Continue to avoid any NSAID containing products including Advil, Aleve, ibuprofen, BC or Goody powders.  It should be okay for you to continue your daily baby aspirin.  We will have you follow-up 1 month post procedure, we can do this virtually.  If you are doing well and there was evidence of healing on her EGD that we may discuss decreasing your Protonix to once daily dosing.  It was a pleasure to see you today. I want to create trusting relationships with patients. If you receive a survey regarding your visit,  I greatly appreciate you taking time to fill this out on paper or through your MyChart. I value your feedback.  Venetia Night, MSN, FNP-BC, AGACNP-BC [facility]

## 2022-05-09 ENCOUNTER — Other Ambulatory Visit: Payer: Self-pay

## 2022-05-09 ENCOUNTER — Emergency Department (HOSPITAL_COMMUNITY)
Admission: EM | Admit: 2022-05-09 | Discharge: 2022-05-09 | Disposition: A | Discharge status: Home / Self Care | Payer: 59 | Attending: Emergency Medicine | Admitting: Emergency Medicine

## 2022-05-09 ENCOUNTER — Inpatient Hospital Stay (HOSPITAL_COMMUNITY): Payer: 59

## 2022-05-09 ENCOUNTER — Encounter (HOSPITAL_COMMUNITY): Payer: Self-pay

## 2022-05-09 ENCOUNTER — Emergency Department (HOSPITAL_COMMUNITY): Payer: 59

## 2022-05-09 DIAGNOSIS — Z7982 Long term (current) use of aspirin: Secondary | ICD-10-CM | POA: Insufficient documentation

## 2022-05-09 DIAGNOSIS — E1122 Type 2 diabetes mellitus with diabetic chronic kidney disease: Secondary | ICD-10-CM | POA: Diagnosis not present

## 2022-05-09 DIAGNOSIS — N189 Chronic kidney disease, unspecified: Secondary | ICD-10-CM | POA: Insufficient documentation

## 2022-05-09 DIAGNOSIS — X500XXA Overexertion from strenuous movement or load, initial encounter: Secondary | ICD-10-CM | POA: Insufficient documentation

## 2022-05-09 DIAGNOSIS — M545 Low back pain, unspecified: Secondary | ICD-10-CM

## 2022-05-09 DIAGNOSIS — S39012A Strain of muscle, fascia and tendon of lower back, initial encounter: Secondary | ICD-10-CM | POA: Diagnosis not present

## 2022-05-09 DIAGNOSIS — I129 Hypertensive chronic kidney disease with stage 1 through stage 4 chronic kidney disease, or unspecified chronic kidney disease: Secondary | ICD-10-CM | POA: Insufficient documentation

## 2022-05-09 DIAGNOSIS — S3992XA Unspecified injury of lower back, initial encounter: Secondary | ICD-10-CM | POA: Diagnosis present

## 2022-05-09 DIAGNOSIS — Z79899 Other long term (current) drug therapy: Secondary | ICD-10-CM | POA: Diagnosis not present

## 2022-05-09 LAB — URINALYSIS, ROUTINE W REFLEX MICROSCOPIC
Bilirubin Urine: NEGATIVE
Glucose, UA: >=500 mg/dL — AB
Hgb urine dipstick: NEGATIVE
Ketones, ur: NEGATIVE mg/dL
Leukocytes,Ua: NEGATIVE
Nitrite: NEGATIVE
Protein, ur: 100 mg/dL — AB
Specific Gravity, Urine: 1.007 (ref 1.005–1.030)
pH: 6 (ref 5.0–8.0)

## 2022-05-09 LAB — BASIC METABOLIC PANEL
Anion gap: 8 (ref 5–15)
BUN: 34 mg/dL — ABNORMAL HIGH (ref 8–23)
CO2: 22 mmol/L (ref 22–32)
Calcium: 9.6 mg/dL (ref 8.9–10.3)
Chloride: 104 mmol/L (ref 98–111)
Creatinine, Ser: 2.56 mg/dL — ABNORMAL HIGH (ref 0.61–1.24)
GFR, Estimated: 27 mL/min — ABNORMAL LOW (ref >60)
Glucose, Bld: 165 mg/dL — ABNORMAL HIGH (ref 70–99)
Potassium: 4 mmol/L (ref 3.5–5.1)
Sodium: 134 mmol/L — ABNORMAL LOW (ref 135–145)

## 2022-05-09 LAB — CBC
HCT: 38.2 % — ABNORMAL LOW (ref 39.0–52.0)
Hemoglobin: 12.7 g/dL — ABNORMAL LOW (ref 13.0–17.0)
MCH: 31.4 pg (ref 26.0–34.0)
MCHC: 33.2 g/dL (ref 30.0–36.0)
MCV: 94.6 fL (ref 80.0–100.0)
Platelets: 334 10*3/uL (ref 150–400)
RBC: 4.04 MIL/uL — ABNORMAL LOW (ref 4.22–5.81)
RDW: 13.4 % (ref 11.5–15.5)
WBC: 11.3 10*3/uL — ABNORMAL HIGH (ref 4.0–10.5)
nRBC: 0 % (ref 0.0–0.2)

## 2022-05-09 MED ORDER — FENTANYL CITRATE PF 50 MCG/ML IJ SOSY
50.0000 ug | PREFILLED_SYRINGE | Freq: Once | INTRAMUSCULAR | Status: AC
  Administered 2022-05-09: 50 ug via INTRAVENOUS
  Filled 2022-05-09: qty 1

## 2022-05-09 MED ORDER — OXYCODONE-ACETAMINOPHEN 5-325 MG PO TABS: 1.0000 | ORAL_TABLET | Freq: Four times a day (QID) | ORAL | 0 refills | Status: DC | PRN

## 2022-05-09 NOTE — Discharge Instructions (Addendum)
You were seen in the emergency department today for back pain.  As we discussed I think you likely strained a muscle in your lower back.  Your lab tests and imaging looked reassuring today.  There was not an evidence of kidney stone.  Giving you a prescription for short course of some pain medication. This has 325 mg of tylenol in it so you can take an additional 500 mg of tylenol if needed.   The radiologist wanted me to make you aware that you have a small pulmonary nodule in your right lower lung.  They like you to have a follow-up CT scan in approximately 6 months.  Continue to monitor how you're doing and return to the ER for new or worsening symptoms.

## 2022-05-09 NOTE — ED Triage Notes (Signed)
Patient states mid to lowerr back pain that radiates to bilateral sides. Patient states dysuria with kidney stone patient states "that is too big to pass."

## 2022-05-09 NOTE — ED Provider Notes (Signed)
[facility] EMERGENCY DEPARTMENT Provider Note   CSN: [id number] Arrival date & time: 05/09/22  5809     History  Chief Complaint  Patient presents with   Back Pain    Bryan Atkinson is a 66 y.o. male who presents the emergency department complaining of lower back pain starting 3 days ago.  Patient states that he had lifted a heavy planter on his deck, but did not feel any pain at the time.  He woke up the next day with severe pain radiating to his bilateral sides.  He has history of chronic kidney disease and previous kidney stones, was told that he had a stone that was "too big to pass" lodged in his kidneys sometime ago.  He is not sure if this could have moved.  He has some discomfort with urinating, decreased urination for several months, and lower abdominal discomfort.  He tried taking Tylenol and muscle relaxer yesterday without relief.  Has a history of gastric ulcers, and cannot take other NSAIDs.   Back Pain Associated symptoms: abdominal pain   Associated symptoms: no fever        Home Medications Prior to Admission medications   Medication Sig Start Date End Date Taking? Authorizing Provider  oxyCODONE-acetaminophen (PERCOCET/ROXICET) 5-325 MG tablet Take 1 tablet by mouth every 6 (six) hours as needed for severe pain. 05/09/22  Yes Odie Edmonds T, PA-C  acetaminophen (TYLENOL) 500 MG tablet Take 500 mg by mouth every 6 (six) hours as needed for mild pain.    [provider]  allopurinol (ZYLOPRIM) 300 MG tablet Take 300 mg by mouth daily.    [provider]  amLODipine (NORVASC) 5 MG tablet Take 0.5 tablets (2.5 mg total) by mouth daily. 03/28/22   Barton Dubois, MD  Apple Cider Vinegar 500 MG TABS Take 500 mg by mouth daily.     [provider]  aspirin EC 81 MG tablet Take 81 mg by mouth daily. Swallow whole.    [provider]  carvedilol (COREG) 25 MG tablet Take 25 mg by mouth 2 (two) times daily. 12/03/20   [provider]  Co-Enzyme Q10 100 MG CAPS Take 100 mg by mouth daily.     [provider]  Cranberry 500 MG TABS Take 500 mg by mouth daily.    [provider]  FARXIGA 5 MG TABS tablet Take 5 mg by mouth every morning. 02/17/22   [provider]  fenofibrate (TRICOR) 145 MG tablet Take 145 mg by mouth daily.    [provider]  ferrous sulfate 325 (65 FE) MG tablet Take 325 mg by mouth daily with breakfast.    [provider]  Flaxseed, Linseed, (FLAXSEED OIL) 1000 MG CAPS Take 1,000 mg by mouth daily.    [provider]  furosemide (LASIX) 20 MG tablet Take 1 tablet (20 mg total) by mouth 2 (two) times daily. 03/27/22   Barton Dubois, MD  glipiZIDE (GLUCOTROL XL) 10 MG 24 hr tablet Take 10 mg by mouth daily as needed (If blood sugar is > 130).  06/17/20   [provider]  glucose blood test strip 1 each by Other route as needed for other. Use as instructed    [provider]  Javier Docker Oil 500 MG CAPS Take 500 mg by mouth 2 (two) times a day.    [provider]  Multiple Vitamin (MULTIVITAMIN) tablet Take 1 tablet by mouth daily.    [provider]  Niacin  CR 1000 MG TBCR Take 1,000 mg by mouth every evening.    [provider]  pantoprazole (PROTONIX) 40 MG tablet Take 1 tablet (40 mg total) by mouth 2 (two) times daily. 03/26/22 03/26/23  Barton Dubois, MD  pravastatin (PRAVACHOL) 40 MG tablet Take 40 mg by mouth at bedtime.     [provider]  traZODone (DESYREL) 50 MG tablet Take 50 mg by mouth at bedtime.     [provider]  triamcinolone cream (KENALOG) 0.1 % Apply 1 application topically 2 (two) times daily as needed (rash).  Patient not taking: Reported on 05/03/2022    [provider]  Turmeric 500 MG CAPS Take 500 mg by mouth daily.    [provider]      Allergies    Patient has no known allergies.    Review of Systems   Review of Systems   Constitutional:  Negative for chills and fever.  Gastrointestinal:  Positive for abdominal pain and constipation. Negative for diarrhea, nausea and vomiting.  Genitourinary:  Positive for decreased urine volume. Negative for hematuria.  Musculoskeletal:  Positive for back pain.  All other systems reviewed and are negative.   Physical Exam Updated Vital Signs BP 118/78   Pulse 83   Temp 98.6 F (37 C) (Oral)   Resp 17   Ht 5\' 9"  (1.753 m)   Wt 108 kg   SpO2 96%   BMI 35.16 kg/m  Physical Exam Vitals and nursing note reviewed.  Constitutional:      Appearance: Normal appearance.  HENT:     Head: Normocephalic and atraumatic.  Eyes:     Conjunctiva/sclera: Conjunctivae normal.  Cardiovascular:     Rate and Rhythm: Normal rate and regular rhythm.  Pulmonary:     Effort: Pulmonary effort is normal. No respiratory distress.     Breath sounds: Normal breath sounds.  Abdominal:     General: There is no distension.     Palpations: Abdomen is soft.     Tenderness: There is abdominal tenderness in the suprapubic area. There is no right CVA tenderness, left CVA tenderness, guarding or rebound.  Musculoskeletal:     Comments: No reproducible tenderness to palpation of spine or muscles in thoracolumbar region. No CVA tenderness. Pain with sitting upright for exam.   No midline spinal tenderness, step-offs or crepitus.  Strength 5/5 in all extremities.  Sensation intact in all extremities.   Skin:    General: Skin is warm and dry.  Neurological:     General: No focal deficit present.     Mental Status: He is alert.     ED Results / Procedures / Treatments   Labs (all labs ordered are listed, but only abnormal results are displayed) Labs Reviewed  URINALYSIS, ROUTINE W REFLEX MICROSCOPIC - Abnormal; Notable for the following components:      Result Value   Glucose, UA >=500 (*)    Protein, ur 100 (*)    Bacteria, UA RARE (*)    All other components within normal limits   BASIC METABOLIC PANEL - Abnormal; Notable for the following components:   Sodium 134 (*)    Glucose, Bld 165 (*)    BUN 34 (*)    Creatinine, Ser 2.56 (*)    GFR, Estimated 27 (*)    All other components within normal limits  CBC - Abnormal; Notable for the following components:   WBC 11.3 (*)    RBC 4.04 (*)    Hemoglobin  12.7 (*)    HCT 38.2 (*)    All other components within normal limits    EKG None  Radiology CT Renal Stone Study  Result Date: 05/09/2022 CLINICAL DATA:  Bilateral back/flank pain with dysuria EXAM: CT ABDOMEN AND PELVIS WITHOUT CONTRAST TECHNIQUE: Multidetector CT imaging of the abdomen and pelvis was performed following the standard protocol without IV contrast. RADIATION DOSE REDUCTION: This exam was performed according to the departmental dose-optimization program which includes automated exposure control, adjustment of the mA and/or kV according to patient size and/or use of iterative reconstruction technique. COMPARISON:  None Available. FINDINGS: Lower chest: Ground-glass nodule in the lateral right lower lobe (series 4/image 17) measures 1.1 x 0.9 cm. Advanced coronary artery atherosclerotic calcification. Hepatobiliary: No focal liver abnormality is seen. No gallstones, gallbladder wall thickening, or biliary dilatation. Pancreas: Unremarkable. No pancreatic ductal dilatation or surrounding inflammatory changes. Spleen: Low-density exophytic cystic lesion off the anterior spleen is compatible with a splenic cyst. Adrenals/Urinary Tract: Unremarkable adrenal glands. 7 mm calculus in the mid right kidney. 5 mm calculus in the lower pole of the left kidney. No additional urinary calculi. No hydronephrosis. Multiple bilateral renal cysts measuring up to 6 cm on the right. Stomach/Bowel: Stomach is within normal limits. Appendix appears normal. No evidence of bowel wall thickening, distention, or inflammatory changes. Vascular/Lymphatic: Aortic atherosclerotic  calcification. No enlarged abdominal or pelvic lymph nodes. Reproductive: Prostate is unremarkable. Other: No free intraperitoneal fluid or air. Musculoskeletal: Advanced thoracolumbar spondylosis, degenerative disc disease, and facet arthropathy. Endplate compression deformities of T11 and T12 are likely chronic. Benign-appearing fibro-osseous lesion anterior left acetabulum. IMPRESSION: 1. Bilateral nephrolithiasis. No obstructing urinary calculi or hydronephrosis. 2. Advanced degenerative changes of the thoracolumbar spine. 3. Indeterminate 1.0 cm ground-glass nodule in the right lower lobe. Initial follow-up with CT at 6 months is recommended to confirm persistence. If persistent, repeat CT is recommended every 2 years until 5 years of stability has been established. This recommendation follows the consensus statement: Guidelines for Management of Incidental Pulmonary Nodules Detected on CT Images: From the Fleischner Society 2017; Radiology 2017; 284:228-243. 4. Aortic Atherosclerosis (ICD10-I70.0) and coronary artery atherosclerosis. These results were called by telephone at the time of interpretation on 05/09/2022 at 12:29 pm to provider [facility] Zachrey Deutscher , who verbally acknowledged these results. Electronically Signed   By: Placido Sou M.D.   On: 05/09/2022 12:40    Procedures Procedures    Medications Ordered in ED Medications  fentaNYL (SUBLIMAZE) injection 50 mcg (50 mcg Intravenous Given 05/09/22 1157)    ED Course/ Medical Decision Making/ A&P                           Medical Decision Making Amount and/or Complexity of Data Reviewed Labs: ordered. Radiology: ordered.  Risk Prescription drug management.  This patient is a 66 y.o. male  who presents to the ED for concern of low back pain. Lifted something heavy 2 days ago. Hx of kidney stones and chronic kidney disease.    Differential diagnoses prior to evaluation: The emergent differential diagnosis includes, but is not limited  to,  Muscle strain, cauda equina, spinal stenosis, DDD, ligamentous injury, disk herniation, metastatic cancer, vertebral osteomyelitis, kidney stone, pyelonephritis, AAA, pancreatitis, bowel obstruction, meningitis. This is not an exhaustive differential.   Past Medical History / Co-morbidities: Hypertension, diabetes, kidney stones, hyperlipidemia, chronic kidney disease, gastric ulcer  Additional history: Chart reviewed. Pertinent results include: Patient recently seen on 6/8 after  being sent from his PCP for worsening anemia and kidney function.  Was admitted to the hospital with hemoglobin of 7.7, creatinine 3.5, and positive Hemoccult.  Was discharged on 6/10 after endoscopy with nonbleeding gastric ulcer.  Physical Exam: Physical exam performed. The pertinent findings include: Normal vital signs.  No midline spinal tenderness, step-offs or crepitus.  No reproducible tenderness palpation throughout the lumbar area, no CVA tenderness.  Abdomen soft, with mild suprapubic tenderness to palpation without guarding or rebound.  Normal strength and sensation in all extremities.  Low suspicion for myelopathy.  Lab Tests/Imaging studies: I personally interpreted labs/imaging and the pertinent results include: Leukocytosis of 11.3, , Hemoglobin improved compared to prior.  Kidney function stable.  Urinalysis with increased glucose and protein, no ketones or hematuria.  No evidence of UTI.   CT renal stone study with bilateral nephrolithiasis, no obstructing calculi or hydronephrosis.  I agree with the radiologist interpretation.  Radiologist also called to make me aware of a 1.0 cm groundglass nodule in the right lower lobe and recommended follow-up CT scan in 6 months.  Made the patient aware.   Medications: I ordered medication including fentanyl.  I have reviewed the patients home medicines and have made adjustments as needed.  On reevaluation, patient states pain has improved.    Disposition: After consideration of the diagnostic results and the patients response to treatment, I feel that emergency department workup does not suggest an emergent condition requiring admission or immediate intervention beyond what has been performed at this time. The plan is: Discharge to home with short course of pain medication for likely lumbar strain.  Patient is following up with his kidney doctor later today, and I recommended he make them aware about his decreased urination for the past several months.  The patient is safe for discharge and has been instructed to return immediately for worsening symptoms, change in symptoms or any other concerns.         Final Clinical Impression(s) / ED Diagnoses Final diagnoses:  Acute bilateral low back pain without sciatica  Strain of lumbar region, initial encounter    Rx / DC Orders ED Discharge Orders          Ordered    oxyCODONE-acetaminophen (PERCOCET/ROXICET) 5-325 MG tablet  Every 6 hours PRN        05/09/22 1408           Portions of this report may have been transcribed using voice recognition software. Every effort was made to ensure accuracy; however, inadvertent computerized transcription errors may be present.    Estill Cotta 05/09/22 1419    Daleen Bo, MD 05/09/22 [phone]

## 2022-05-16 ENCOUNTER — Inpatient Hospital Stay (HOSPITAL_COMMUNITY): Payer: 59

## 2022-05-16 VITALS — BP 141/88 | HR 83 | Temp 97.9°F | Resp 17

## 2022-05-16 DIAGNOSIS — D500 Iron deficiency anemia secondary to blood loss (chronic): Secondary | ICD-10-CM

## 2022-05-16 DIAGNOSIS — D649 Anemia, unspecified: Secondary | ICD-10-CM

## 2022-05-16 DIAGNOSIS — N184 Chronic kidney disease, stage 4 (severe): Secondary | ICD-10-CM | POA: Diagnosis not present

## 2022-05-16 MED ORDER — LORATADINE 10 MG PO TABS
10.0000 mg | ORAL_TABLET | Freq: Once | ORAL | Status: AC
  Administered 2022-05-16: 10 mg via ORAL
  Filled 2022-05-16: qty 1

## 2022-05-16 MED ORDER — ACETAMINOPHEN 325 MG PO TABS
650.0000 mg | ORAL_TABLET | Freq: Once | ORAL | Status: AC
  Administered 2022-05-16: 650 mg via ORAL
  Filled 2022-05-16: qty 2

## 2022-05-16 MED ORDER — SODIUM CHLORIDE 0.9 % IV SOLN
300.0000 mg | Freq: Once | INTRAVENOUS | Status: AC
  Administered 2022-05-16: 300 mg via INTRAVENOUS
  Filled 2022-05-16: qty 5

## 2022-05-16 MED ORDER — SODIUM CHLORIDE 0.9 % IV SOLN: Freq: Once | INTRAVENOUS | Status: AC

## 2022-05-16 NOTE — Patient Instructions (Signed)

## 2022-05-24 ENCOUNTER — Inpatient Hospital Stay: Payer: 59 | Attending: Physician Assistant

## 2022-05-24 VITALS — BP 142/96 | HR 87 | Temp 98.0°F | Resp 20

## 2022-05-24 DIAGNOSIS — D500 Iron deficiency anemia secondary to blood loss (chronic): Secondary | ICD-10-CM

## 2022-05-24 DIAGNOSIS — D509 Iron deficiency anemia, unspecified: Secondary | ICD-10-CM | POA: Diagnosis not present

## 2022-05-24 DIAGNOSIS — D649 Anemia, unspecified: Secondary | ICD-10-CM

## 2022-05-24 MED ORDER — SODIUM CHLORIDE 0.9 % IV SOLN: Freq: Once | INTRAVENOUS | Status: AC

## 2022-05-24 MED ORDER — LORATADINE 10 MG PO TABS
10.0000 mg | ORAL_TABLET | Freq: Once | ORAL | Status: AC
  Administered 2022-05-24: 10 mg via ORAL
  Filled 2022-05-24: qty 1

## 2022-05-24 MED ORDER — SODIUM CHLORIDE 0.9 % IV SOLN
300.0000 mg | Freq: Once | INTRAVENOUS | Status: AC
  Administered 2022-05-24: 300 mg via INTRAVENOUS
  Filled 2022-05-24: qty 300

## 2022-05-24 MED ORDER — ACETAMINOPHEN 325 MG PO TABS
650.0000 mg | ORAL_TABLET | Freq: Once | ORAL | Status: AC
  Administered 2022-05-24: 650 mg via ORAL
  Filled 2022-05-24: qty 2

## 2022-05-24 NOTE — Progress Notes (Signed)
Patient tolerated iron infusion with no complaints voiced.  Peripheral IV site clean and dry with good blood return noted before and after infusion.  Band aid applied.  VSS with discharge and left in satisfactory condition with no s/s of distress noted.   

## 2022-05-24 NOTE — Patient Instructions (Signed)
[facility]  Discharge Instructions: Thank you for choosing [facility] to provide your oncology and hematology care.  If you have a lab appointment with the Cancer Center, please come in thru the Main Entrance and check in at the main information desk.  Wear comfortable clothing and clothing appropriate for easy access to any Portacath or PICC line.   We strive to give you quality time with your provider. You may need to reschedule your appointment if you arrive late (15 or more minutes).  Arriving late affects you and other patients whose appointments are after yours.  Also, if you miss three or more appointments without notifying the office, you may be dismissed from the clinic at the provider's discretion.      For prescription refill requests, have your pharmacy contact our office and allow 72 hours for refills to be completed.      To help prevent nausea and vomiting after your treatment, we encourage you to take your nausea medication as directed.  BELOW ARE SYMPTOMS THAT SHOULD BE REPORTED IMMEDIATELY: *FEVER GREATER THAN 100.4 F (38 C) OR HIGHER *CHILLS OR SWEATING *NAUSEA AND VOMITING THAT IS NOT CONTROLLED WITH YOUR NAUSEA MEDICATION *UNUSUAL SHORTNESS OF BREATH *UNUSUAL BRUISING OR BLEEDING *URINARY PROBLEMS (pain or burning when urinating, or frequent urination) *BOWEL PROBLEMS (unusual diarrhea, constipation, pain near the anus) TENDERNESS IN MOUTH AND THROAT WITH OR WITHOUT PRESENCE OF ULCERS (sore throat, sores in mouth, or a toothache) UNUSUAL RASH, SWELLING OR PAIN  UNUSUAL VAGINAL DISCHARGE OR ITCHING   Items with * indicate a potential emergency and should be followed up as soon as possible or go to the Emergency Department if any problems should occur.  Please show the CHEMOTHERAPY ALERT CARD or IMMUNOTHERAPY ALERT CARD at check-in to the Emergency Department and triage nurse.  Should you have questions after your visit or need to  cancel or reschedule your appointment, please contact [facility] [phone]  and follow the prompts.  Office hours are 8:00 a.m. to 4:30 p.m. Monday - Friday. Please note that voicemails left after 4:00 p.m. may not be returned until the following business day.  We are closed weekends and major holidays. You have access to a nurse at all times for urgent questions. Please call the main number to the clinic [phone] and follow the prompts.  For any non-urgent questions, you may also contact your provider using MyChart. We now offer e-Visits for anyone 18 and older to request care online for non-urgent symptoms. For details visit [email or web address].   Also download the MyChart app! Go to the app store, search "MyChart", open the app, select [facility], and log in with your MyChart username and password.  Masks are optional in the cancer centers. If you would like for your care team to wear a mask while they are taking care of you, please let them know. For doctor visits, patients may have with them one support person who is at least 64 years old. At this time, visitors are not allowed in the infusion area.  

## 2022-06-01 ENCOUNTER — Other Ambulatory Visit (HOSPITAL_COMMUNITY)
Admission: RE | Admit: 2022-06-01 | Discharge: 2022-06-01 | Disposition: A | Discharge status: Home / Self Care | Payer: 59 | Source: Ambulatory Visit | Attending: Nephrology | Admitting: Nephrology

## 2022-06-01 DIAGNOSIS — R809 Proteinuria, unspecified: Secondary | ICD-10-CM | POA: Insufficient documentation

## 2022-06-01 DIAGNOSIS — Z8719 Personal history of other diseases of the digestive system: Secondary | ICD-10-CM | POA: Insufficient documentation

## 2022-06-01 DIAGNOSIS — D631 Anemia in chronic kidney disease: Secondary | ICD-10-CM | POA: Diagnosis not present

## 2022-06-01 DIAGNOSIS — R808 Other proteinuria: Secondary | ICD-10-CM | POA: Diagnosis not present

## 2022-06-01 DIAGNOSIS — N189 Chronic kidney disease, unspecified: Secondary | ICD-10-CM | POA: Diagnosis present

## 2022-06-01 DIAGNOSIS — D508 Other iron deficiency anemias: Secondary | ICD-10-CM | POA: Insufficient documentation

## 2022-06-01 DIAGNOSIS — E1122 Type 2 diabetes mellitus with diabetic chronic kidney disease: Secondary | ICD-10-CM | POA: Diagnosis not present

## 2022-06-01 DIAGNOSIS — E1129 Type 2 diabetes mellitus with other diabetic kidney complication: Secondary | ICD-10-CM | POA: Diagnosis not present

## 2022-06-01 DIAGNOSIS — I129 Hypertensive chronic kidney disease with stage 1 through stage 4 chronic kidney disease, or unspecified chronic kidney disease: Secondary | ICD-10-CM | POA: Insufficient documentation

## 2022-06-01 LAB — CBC
HCT: 35.8 % — ABNORMAL LOW (ref 39.0–52.0)
Hemoglobin: 12 g/dL — ABNORMAL LOW (ref 13.0–17.0)
MCH: 31.2 pg (ref 26.0–34.0)
MCHC: 33.5 g/dL (ref 30.0–36.0)
MCV: 93 fL (ref 80.0–100.0)
Platelets: 303 10*3/uL (ref 150–400)
RBC: 3.85 MIL/uL — ABNORMAL LOW (ref 4.22–5.81)
RDW: 13.7 % (ref 11.5–15.5)
WBC: 10 10*3/uL (ref 4.0–10.5)
nRBC: 0 % (ref 0.0–0.2)

## 2022-06-01 LAB — IRON AND TIBC
Iron: 73 ug/dL (ref 45–182)
Saturation Ratios: 22 % (ref 17.9–39.5)
TIBC: 337 ug/dL (ref 250–450)
UIBC: 264 ug/dL

## 2022-06-01 LAB — RENAL FUNCTION PANEL
Albumin: 4 g/dL (ref 3.5–5.0)
Anion gap: 5 (ref 5–15)
BUN: 44 mg/dL — ABNORMAL HIGH (ref 8–23)
CO2: 24 mmol/L (ref 22–32)
Calcium: 9.6 mg/dL (ref 8.9–10.3)
Chloride: 108 mmol/L (ref 98–111)
Creatinine, Ser: 2.82 mg/dL — ABNORMAL HIGH (ref 0.61–1.24)
GFR, Estimated: 24 mL/min — ABNORMAL LOW (ref >60)
Glucose, Bld: 85 mg/dL (ref 70–99)
Phosphorus: 2.8 mg/dL (ref 2.5–4.6)
Potassium: 3.6 mmol/L (ref 3.5–5.1)
Sodium: 137 mmol/L (ref 135–145)

## 2022-06-01 LAB — PROTEIN / CREATININE RATIO, URINE
Creatinine, Urine: 135.04 mg/dL
Protein Creatinine Ratio: 0.13 mg/mg{Cre} (ref 0.00–0.15)
Total Protein, Urine: 17 mg/dL

## 2022-06-01 LAB — FERRITIN: Ferritin: 515 ng/mL — ABNORMAL HIGH (ref 24–336)

## 2022-06-02 LAB — PTH, INTACT AND CALCIUM
Calcium, Total (PTH): 10.1 mg/dL (ref 8.6–10.2)
PTH: 34 pg/mL (ref 15–65)

## 2022-06-09 NOTE — Patient Instructions (Signed)
Bryan Atkinson  06/09/2022     @PREFPERIOPPHARMACY @   Your procedure is scheduled on  06/15/2022.   Report to [facility] at  0600  A.M.   Call this number if you have problems the morning of surgery:  [phone]   Remember:  Follow the diet instructions given to you by the office.    Take these medicines the morning of surgery with A SIP OF WATER                        allopurinol, norvasc, coreg, protonix.     Do not wear jewelry, make-up or nail polish.  Do not wear lotions, powders, or perfumes, or deodorant.  Do not shave 48 hours prior to surgery.  Men may shave face and neck.  Do not bring valuables to the hospital.  [facility] is not responsible for any belongings or valuables.  Contacts, dentures or bridgework may not be worn into surgery.  Leave your suitcase in the car.  After surgery it may be brought to your room.  For patients admitted to the hospital, discharge time will be determined by your treatment team.  Patients discharged the day of surgery will not be allowed to drive home and must have someone with them for 24 hours.    Special instructions:   DO NOT smoke tobacco or vape for 24 hours before your procedure.  Please read over the following fact sheets that you were given. Anesthesia Post-op Instructions and Care and Recovery After Surgery      Upper Endoscopy, Adult, Care After After the procedure, it is common to have a sore throat. It is also common to have: Mild stomach pain or discomfort. Bloating. Nausea. Follow these instructions at home: The instructions below may help you care for yourself at home. Your health care provider may give you more instructions. If you have questions, ask your health care provider. If you were given a sedative during the procedure, it can affect you for several hours. Do not drive or operate machinery until your health care provider says that it is safe. If you will be going home right after  the procedure, plan to have a responsible adult: Take you home from the hospital or clinic. You will not be allowed to drive. Care for you for the time you are told. Follow instructions from your health care provider about what you may eat and drink. Return to your normal activities as told by your health care provider. Ask your health care provider what activities are safe for you. Take over-the-counter and prescription medicines only as told by your health care provider. Contact a health care provider if you: Have a sore throat that lasts longer than one day. Have trouble swallowing. Have a fever. Get help right away if you: Vomit blood or your vomit looks like coffee grounds. Have bloody, black, or tarry stools. Have a very bad sore throat or you cannot swallow. Have difficulty breathing or very bad pain in your chest or abdomen. These symptoms may be an emergency. Get help right away. Call 911. Do not wait to see if the symptoms will go away. Do not drive yourself to the hospital. Summary After the procedure, it is common to have a sore throat, mild stomach discomfort, bloating, and nausea. If you were given a sedative during the procedure, it can affect you for several hours. Do not drive until your health care provider  says that it is safe. Follow instructions from your health care provider about what you may eat and drink. Return to your normal activities as told by your health care provider. This information is not intended to replace advice given to you by your health care provider. Make sure you discuss any questions you have with your health care provider. Document Revised: 01/12/2022 Document Reviewed: 01/12/2022 Elsevier Patient Education  [place] After This sheet gives you information about how to care for yourself after your procedure. Your health care provider may also give you more specific instructions. If you have problems or  questions, contact your health care provider. What can I expect after the procedure? After the procedure, it is common to have: Tiredness. Forgetfulness about what happened after the procedure. Impaired judgment for important decisions. Nausea or vomiting. Some difficulty with balance. Follow these instructions at home: For the time period you were told by your health care provider:     Rest as needed. Do not participate in activities where you could fall or become injured. Do not drive or use machinery. Do not drink alcohol. Do not take sleeping pills or medicines that cause drowsiness. Do not make important decisions or sign legal documents. Do not take care of children on your own. Eating and drinking Follow the diet that is recommended by your health care provider. Drink enough fluid to keep your urine pale yellow. If you vomit: Drink water, juice, or soup when you can drink without vomiting. Make sure you have little or no nausea before eating solid foods. General instructions Have a responsible adult stay with you for the time you are told. It is important to have someone help care for you until you are awake and alert. Take over-the-counter and prescription medicines only as told by your health care provider. If you have sleep apnea, surgery and certain medicines can increase your risk for breathing problems. Follow instructions from your health care provider about wearing your sleep device: Anytime you are sleeping, including during daytime naps. While taking prescription pain medicines, sleeping medicines, or medicines that make you drowsy. Avoid smoking. Keep all follow-up visits as told by your health care provider. This is important. Contact a health care provider if: You keep feeling nauseous or you keep vomiting. You feel light-headed. You are still sleepy or having trouble with balance after 24 hours. You develop a rash. You have a fever. You have redness or  swelling around the IV site. Get help right away if: You have trouble breathing. You have new-onset confusion at home. Summary For several hours after your procedure, you may feel tired. You may also be forgetful and have poor judgment. Have a responsible adult stay with you for the time you are told. It is important to have someone help care for you until you are awake and alert. Rest as told. Do not drive or operate machinery. Do not drink alcohol or take sleeping pills. Get help right away if you have trouble breathing, or if you suddenly become confused. This information is not intended to replace advice given to you by your health care provider. Make sure you discuss any questions you have with your health care provider. Document Revised: 09/07/2021 Document Reviewed: 09/05/2019 Elsevier Patient Education  [place].

## 2022-06-10 ENCOUNTER — Encounter (HOSPITAL_COMMUNITY)
Admission: RE | Admit: 2022-06-10 | Discharge: 2022-06-10 | Disposition: A | Discharge status: Home / Self Care | Payer: 59 | Source: Ambulatory Visit | Attending: Internal Medicine | Admitting: Internal Medicine

## 2022-06-10 ENCOUNTER — Encounter (HOSPITAL_COMMUNITY): Payer: Self-pay

## 2022-06-10 VITALS — BP 159/96 | HR 96 | Temp 98.0°F | Resp 18 | Ht 69.0 in | Wt 238.1 lb

## 2022-06-10 DIAGNOSIS — Z01810 Encounter for preprocedural cardiovascular examination: Secondary | ICD-10-CM | POA: Diagnosis present

## 2022-06-10 DIAGNOSIS — I10 Essential (primary) hypertension: Secondary | ICD-10-CM | POA: Diagnosis not present

## 2022-06-10 HISTORY — DX: Gastro-esophageal reflux disease without esophagitis: K21.9

## 2022-06-10 HISTORY — DX: Anemia, unspecified: D64.9

## 2022-06-15 ENCOUNTER — Ambulatory Visit (HOSPITAL_BASED_OUTPATIENT_CLINIC_OR_DEPARTMENT_OTHER): Payer: 59 | Admitting: Certified Registered Nurse Anesthetist

## 2022-06-15 ENCOUNTER — Encounter (HOSPITAL_COMMUNITY): Payer: Self-pay | Admitting: Internal Medicine

## 2022-06-15 ENCOUNTER — Ambulatory Visit (HOSPITAL_COMMUNITY): Payer: 59 | Admitting: Certified Registered Nurse Anesthetist

## 2022-06-15 ENCOUNTER — Ambulatory Visit (HOSPITAL_COMMUNITY)
Admission: RE | Admit: 2022-06-15 | Discharge: 2022-06-15 | Disposition: A | Discharge status: Home / Self Care | Payer: 59 | Attending: Internal Medicine | Admitting: Internal Medicine

## 2022-06-15 ENCOUNTER — Encounter (HOSPITAL_COMMUNITY)
Admission: RE | Disposition: A | Discharge status: Home / Self Care | Payer: Self-pay | Source: Home / Self Care | Attending: Internal Medicine

## 2022-06-15 ENCOUNTER — Other Ambulatory Visit: Payer: Self-pay

## 2022-06-15 DIAGNOSIS — I10 Essential (primary) hypertension: Secondary | ICD-10-CM

## 2022-06-15 DIAGNOSIS — K449 Diaphragmatic hernia without obstruction or gangrene: Secondary | ICD-10-CM

## 2022-06-15 DIAGNOSIS — K219 Gastro-esophageal reflux disease without esophagitis: Secondary | ICD-10-CM | POA: Diagnosis not present

## 2022-06-15 DIAGNOSIS — K319 Disease of stomach and duodenum, unspecified: Secondary | ICD-10-CM | POA: Insufficient documentation

## 2022-06-15 DIAGNOSIS — K259 Gastric ulcer, unspecified as acute or chronic, without hemorrhage or perforation: Secondary | ICD-10-CM | POA: Insufficient documentation

## 2022-06-15 DIAGNOSIS — E119 Type 2 diabetes mellitus without complications: Secondary | ICD-10-CM | POA: Insufficient documentation

## 2022-06-15 DIAGNOSIS — D509 Iron deficiency anemia, unspecified: Secondary | ICD-10-CM | POA: Diagnosis not present

## 2022-06-15 DIAGNOSIS — K279 Peptic ulcer, site unspecified, unspecified as acute or chronic, without hemorrhage or perforation: Secondary | ICD-10-CM

## 2022-06-15 HISTORY — PX: ESOPHAGOGASTRODUODENOSCOPY (EGD) WITH PROPOFOL: SHX5813

## 2022-06-15 HISTORY — PX: BIOPSY: SHX5522

## 2022-06-15 LAB — GLUCOSE, CAPILLARY: Glucose-Capillary: 128 mg/dL — ABNORMAL HIGH (ref 70–99)

## 2022-06-15 SURGERY — ESOPHAGOGASTRODUODENOSCOPY (EGD) WITH PROPOFOL
Anesthesia: General

## 2022-06-15 MED ORDER — PROPOFOL 10 MG/ML IV BOLUS
INTRAVENOUS | Status: DC | PRN
  Administered 2022-06-15: 150 mg via INTRAVENOUS

## 2022-06-15 MED ORDER — LIDOCAINE HCL (PF) 2 % IJ SOLN
INTRAMUSCULAR | Status: AC
  Filled 2022-06-15: qty 5

## 2022-06-15 MED ORDER — LIDOCAINE 2% (20 MG/ML) 5 ML SYRINGE
INTRAMUSCULAR | Status: DC | PRN
  Administered 2022-06-15: 50 mg via INTRAVENOUS

## 2022-06-15 MED ORDER — LACTATED RINGERS IV SOLN: INTRAVENOUS | Status: DC | PRN

## 2022-06-15 MED ORDER — PROPOFOL 500 MG/50ML IV EMUL
INTRAVENOUS | Status: AC
  Filled 2022-06-15: qty 50

## 2022-06-15 MED ORDER — PROPOFOL 500 MG/50ML IV EMUL
INTRAVENOUS | Status: DC | PRN
  Administered 2022-06-15: 150 ug/kg/min via INTRAVENOUS

## 2022-06-15 MED ORDER — STERILE WATER FOR IRRIGATION IR SOLN: Status: DC | PRN

## 2022-06-15 NOTE — Transfer of Care (Signed)
Immediate Anesthesia Transfer of Care Note  Patient: Bryan Atkinson  Procedure(s) Performed: ESOPHAGOGASTRODUODENOSCOPY (EGD) WITH PROPOFOL BIOPSY  Patient Location: PACU  Anesthesia Type:General  Level of Consciousness: drowsy, patient cooperative and responds to stimulation  Airway & Oxygen Therapy: Patient Spontanous Breathing and Patient connected to nasal cannula oxygen  Post-op Assessment: Report given to RN, Post -op Vital signs reviewed and stable, Patient moving all extremities X 4 and Patient able to stick tongue midline  Post vital signs: Reviewed  Last Vitals:  Vitals Value Taken Time  BP 81/41   Temp 36.6   Pulse 79 06/15/22 0757  Resp 17 06/15/22 0757  SpO2 92 % 06/15/22 0757  Vitals shown include unvalidated device data.  Last Pain:  Vitals:   06/15/22 0723  TempSrc: Oral  PainSc: 0-No pain      Patients Stated Pain Goal: 8 (77/37/50 5107)  Complications: No notable events documented.

## 2022-06-15 NOTE — H&P (Signed)
@LOGO @   Primary Care Physician:  Bryan Beals, NP Primary Gastroenterologist:  Dr. Daiva Atkinson  Pre-Procedure History & Physical: HPI:  Bryan Atkinson is a 66 y.o. male here for surveillance EGD to verify ulcer healing.  H. pylori serologies previously negative.  Biopsies negative.  Patient actually denies all forms of NSAIDs since his last EGD.  Previously Aleve PM was a prime suspect when he presented previously with peptic ulcer disease.  He has no dysphagia.  Past Medical History:  Diagnosis Date   Anemia    Arthritis    Diabetes mellitus without complication ([place])    GERD (gastroesophageal reflux disease)    Gout    History of kidney stones    Hyperlipidemia    Hypertension     Past Surgical History:  Procedure Laterality Date   BIOPSY  05/25/2020   Procedure: BIOPSY;  Surgeon: Daneil Dolin, MD;  [facility];  Service: Endoscopy;;   BIOPSY  03/25/2022   Procedure: BIOPSY;  Surgeon: Harvel Quale, MD;  [facility];  Service: Gastroenterology;;   CATARACT EXTRACTION W/PHACO Right 05/13/2019   Procedure: CATARACT EXTRACTION PHACO AND INTRAOCULAR LENS PLACEMENT ([place]);  Surgeon: Baruch Goldmann, MD;  Location: [facility];  Service: Ophthalmology;  Laterality: Right;  CDE: 10.31   CATARACT EXTRACTION W/PHACO Left 05/27/2019   Procedure: CATARACT EXTRACTION PHACO AND INTRAOCULAR LENS PLACEMENT ([facility]);  Surgeon: Baruch Goldmann, MD;  Location: [facility];  Service: Ophthalmology;  Laterality: Left;  CDE: 6.49   COLONOSCOPY     times two 2002 ,2007   COLONOSCOPY N/A 10/07/2013   Procedure: COLONOSCOPY;  Surgeon: Daneil Dolin, MD;  [facility];  Service: Endoscopy;  Laterality: N/A;  8:30 AM   COLONOSCOPY N/A 10/05/2018   Dr. Gala Romney: Diverticulosis.  Next colonoscopy 5 years due to first-degree relative with colon cancer   ESOPHAGOGASTRODUODENOSCOPY (EGD) WITH PROPOFOL N/A 05/25/2020   [name]: 2 gastric ulcers.  No H. pylori.  Likely NSAID  related.  Repeat EGD in 3 months   ESOPHAGOGASTRODUODENOSCOPY (EGD) WITH PROPOFOL N/A 09/17/2020   Procedure: ESOPHAGOGASTRODUODENOSCOPY (EGD) WITH PROPOFOL;  Surgeon: Daneil Dolin, MD;  [facility];  Service: Endoscopy;  Laterality: N/A;  10:00am   ESOPHAGOGASTRODUODENOSCOPY (EGD) WITH PROPOFOL N/A 03/25/2022   Procedure: ESOPHAGOGASTRODUODENOSCOPY (EGD) WITH PROPOFOL;  Surgeon: Harvel Quale, MD;  [facility];  Service: Gastroenterology;  Laterality: N/A;   JOINT REPLACEMENT     KNEE ARTHROPLASTY Left 06/27/2019   Procedure: COMPUTER ASSISTED TOTAL KNEE ARTHROPLASTY;  Surgeon: Rod Can, MD;  Location: [facility];  Service: Orthopedics;  Laterality: Left;   KNEE ARTHROPLASTY Right 08/15/2019   Procedure: COMPUTER ASSISTED TOTAL KNEE ARTHROPLASTY;  Surgeon: Rod Can, MD;  Location: [facility];  Service: Orthopedics;  Laterality: Right;   LEG SURGERY Right early 80's    Prior to Admission medications   Medication Sig Start Date End Date Taking? Authorizing Provider  acetaminophen (TYLENOL) 500 MG tablet Take 500 mg by mouth every 6 (six) hours as needed for mild pain.   Yes [provider]  allopurinol (ZYLOPRIM) 300 MG tablet Take 300 mg by mouth daily.   Yes [provider]  amLODipine (NORVASC) 5 MG tablet Take 0.5 tablets (2.5 mg total) by mouth daily. 03/28/22  Yes Barton Dubois, MD  Apple Cider Vinegar 500 MG TABS Take 500 mg by mouth daily.    Yes [provider]  aspirin EC 81 MG tablet Take 81 mg by mouth daily.  Swallow whole.   Yes [provider]  carvedilol (COREG) 25 MG tablet Take 25 mg by mouth 2 (two) times daily. 12/03/20  Yes [provider]  Co-Enzyme Q10 100 MG CAPS Take 100 mg by mouth daily.    Yes [provider]  Cranberry 500 MG TABS Take 500 mg by mouth daily.   Yes [provider]  FARXIGA 5 MG TABS tablet Take 5 mg by mouth every morning. 02/17/22  Yes [provider]  fenofibrate (TRICOR) 145 MG tablet Take 145 mg by mouth daily.   Yes [provider]  ferrous sulfate 325 (65 FE) MG tablet Take 325 mg by mouth daily with breakfast.   Yes [provider]  Flaxseed, Linseed, (FLAXSEED OIL) 1000 MG CAPS Take 1,000 mg by mouth daily.   Yes [provider]  furosemide (LASIX) 20 MG tablet Take 1 tablet (20 mg total) by mouth 2 (two) times daily. 03/27/22  Yes Barton Dubois, MD  glipiZIDE (GLUCOTROL XL) 10 MG 24 hr tablet Take 10 mg by mouth daily with breakfast. 06/17/20  Yes [provider]  glucose blood test strip 1 each by Other route as needed for other. Use as instructed   Yes [provider]  hydrALAZINE (APRESOLINE) 100 MG tablet Take 50 mg by mouth 2 (two) times daily. 05/19/22  Yes [provider]  Javier Docker Oil 500 MG CAPS Take 500 mg by mouth 2 (two) times a day.   Yes [provider]  losartan (COZAAR) 25 MG tablet Take 12.5 mg by mouth daily. 05/16/22  Yes [provider]  Multiple Vitamin (MULTIVITAMIN) tablet Take 1 tablet by mouth daily.   Yes [provider]  Niacin CR 1000 MG TBCR Take 1,000 mg by mouth every evening.   Yes [provider]  pantoprazole (PROTONIX) 40 MG tablet Take 1 tablet (40 mg total) by mouth 2 (two) times daily. 03/26/22 03/26/23 Yes Barton Dubois, MD  pravastatin (PRAVACHOL) 40 MG tablet Take 40 mg by mouth at bedtime.    Yes [provider]  traZODone (DESYREL) 50 MG tablet Take 50 mg by mouth at bedtime.    Yes [provider]  Turmeric 500 MG CAPS Take 500 mg by mouth daily.   Yes [provider]    Allergies as of 05/03/2022   (No Known Allergies)    Family History  Problem Relation Age of Onset   Colon cancer Mother        She died from her malignancy, 29   Lung cancer Father        Died from lung cancer, age 50   Emphysema Brother        Died at 84 from lung disease   COPD Brother     Dementia Sister    Healthy Son    Healthy Daughter     Social History   Socioeconomic History   Marital status: Married    Spouse name: Not on file   Number of children: 2   Years of education: Not on file   Highest education level: Not on file  Occupational History   Occupation: Truck Geophysicist/field seismologist    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  3 grandchildren. Married for 34 years   Social Determinants of Radio broadcast assistant Strain: Not on file  Food Insecurity: Not on file  Transportation Needs: Not on file  Physical Activity: Not on file  Stress: Not on file  Social Connections: Not on file  Intimate Partner Violence: Not on file    Review of Systems: See HPI, otherwise negative ROS  Physical Exam: BP (!) 150/93   Pulse 74   Temp 97.9 F (36.6 C) (Oral)   Resp 18   Ht 5\' 9"  (1.753 m)   Wt 108 kg   SpO2 99%   BMI 35.16 kg/m  General:   Alert,  Well-developed, well-nourished, pleasant and cooperative in NAD SMouth:  No deformity or lesions. Neck:  Supple; no masses or thyromegaly. No significant cervical adenopathy. Lungs:  Clear throughout to auscultation.   No wheezes, crackles, or rhonchi. No acute distress. Heart:  Regular rate and rhythm; no murmurs, clicks, rubs,  or gallops. Abdomen: Non-distended, normal bowel sounds.  Soft and nontender without appreciable mass or hepatosplenomegaly.  Pulses:  Normal pulses noted. Extremities:  Without clubbing or edema.  Impression/Plan: Impression: No 66 year old gentleman with a history gastric ulcer histology negative H. pylori serologies negative previously.  Doing well clinically from a  GI standpoint on twice daily PPI therapy.  Here for surveillance EGD to verify healing. The risks, benefits, limitations, alternatives and imponderables have been reviewed with the patient. Potential for esophageal dilation, biopsy, etc. have also been reviewed.  Questions have been answered. All parties agreeable.      Notice: This dictation was prepared with Dragon dictation along with smaller phrase technology. Any transcriptional errors that result from this process are unintentional and may not be corrected upon review.

## 2022-06-15 NOTE — Discharge Instructions (Signed)
EGD Discharge instructions Please read the instructions outlined below and refer to this sheet in the next few weeks. These discharge instructions provide you with general information on caring for yourself after you leave the hospital. Your doctor may also give you specific instructions. While your treatment has been planned according to the most current medical practices available, unavoidable complications occasionally occur. If you have any problems or questions after discharge, please call your doctor. ACTIVITY You may resume your regular activity but move at a slower pace for the next 24 hours.  Take frequent rest periods for the next 24 hours.  Walking will help expel (get rid of) the air and reduce the bloated feeling in your abdomen.  No driving for 24 hours (because of the anesthesia (medicine) used during the test).  You may shower.  Do not sign any important legal documents or operate any machinery for 24 hours (because of the anesthesia used during the test).  NUTRITION Drink plenty of fluids.  You may resume your normal diet.  Begin with a light meal and progress to your normal diet.  Avoid alcoholic beverages for 24 hours or as instructed by your caregiver.  MEDICATIONS You may resume your normal medications unless your caregiver tells you otherwise.  WHAT YOU CAN EXPECT TODAY You may experience abdominal discomfort such as a feeling of fullness or "gas" pains.  FOLLOW-UP Your doctor will discuss the results of your test with you.  SEEK IMMEDIATE MEDICAL ATTENTION IF ANY OF THE FOLLOWING OCCUR: Excessive nausea (feeling sick to your stomach) and/or vomiting.  Severe abdominal pain and distention (swelling).  Trouble swallowing.  Temperature over 101 F (37.8 C).  Rectal bleeding or vomiting of blood.  \   The gastric ulcer is healed however, there is still a quite a bit of inflamed appearing stomach lining around where the ulcer was located.  Additional biopsies  taken.  May decrease Protonix to 40 mg once daily 30 minutes before breakfast   further recommendations to follow pending review of pathology report  Office visit with [place] in 3 months  At patient request, I called Butch Penny at [place] to voicemail left a message.

## 2022-06-15 NOTE — Op Note (Signed)
[facility] Patient Name: Bryan Atkinson Procedure Date: 06/15/2022 7:23 AM MRN: [id number] Date of Birth: Dec 26, 1955 Attending MD: Norvel Richards , MD CSN: [id number] Age: 66 Admit Type: Outpatient Procedure:                Upper GI endoscopy Indications:              Gastric ulcer - surveillance Providers:                Norvel Richards, MD, Janeece Riggers, RN, Raphael Gibney, Technician Referring MD:              Medicines:                Propofol per Anesthesia Complications:            No immediate complications. Estimated Blood Loss:     Estimated blood loss was minimal. Procedure:                Pre-Anesthesia Assessment:                           - Prior to the procedure, a History and Physical                            was performed, and patient medications and                            allergies were reviewed. The patient's tolerance of                            previous anesthesia was also reviewed. The risks                            and benefits of the procedure and the sedation                            options and risks were discussed with the patient.                            All questions were answered, and informed consent                            was obtained. Prior Anticoagulants: The patient has                            taken no previous anticoagulant or antiplatelet                            agents. ASA Grade Assessment: III - A patient with                            severe systemic disease. After reviewing the risks  and benefits, the patient was deemed in                            satisfactory condition to undergo the procedure.                           After obtaining informed consent, the endoscope was                            passed under direct vision. Throughout the                            procedure, the patient's blood pressure, pulse, and                             oxygen saturations were monitored continuously. The                            GIF-H190 ([id number]) scope was introduced through the                            mouth, and advanced to the second part of duodenum.                            The upper GI endoscopy was accomplished without                            difficulty. The patient tolerated the procedure                            well. Scope In: 7:42:19 AM Scope Out: 7:52:52 AM Total Procedure Duration: 0 hours 10 minutes 33 seconds  Findings:      The examined esophagus was normal.      A small hiatal hernia was present. Some pigment laden gastric mucosa       present. In the antrum. There was focal erythematous somewhat scarred       appearing mucosa in the area of site of prior ulcer. Mucosa appeared       angry and a fairly large 4 x 4 centimeter area in which prior ulcer was       located. There was no ulcer crater today. Some scar. Please see photos.       Pylorus patent examination bulb second portion with some pigment laden       mucosa. Otherwise, the duodenal mucosa appeared normal. I like to go       ahead and biopsy the angry appearing antral area once again. This was       done without difficulty. Impression:               - Normal esophagus.                           - Small hiatal hernia. Pigment laden stomach and                            duodenum consistent with iron  ingestion. Previous                            described 10 mm ulcer is no longer present however                            there is quite a bit of abnormal appearing mucosa                            in the antrum encircling where the ulcer was                            previously located. Persisting active inflammation                            persists. Biopsies taken.                           - No specimens collected. Moderate Sedation:      Moderate (conscious) sedation was administered by the endoscopy nurse       and supervised by the  endoscopist. The following parameters were       monitored: oxygen saturation, heart rate, blood pressure, and response       to care. Recommendation:           - Patient has a contact number available for                            emergencies. The signs and symptoms of potential                            delayed complications were discussed with the                            patient. Return to normal activities tomorrow.                            Written discharge instructions were provided to the                            patient.                           - Advance diet as tolerated. Follow-up on                            pathology. De-escalate PPI therapy to once daily.                            Absolutely avoid all NSAIDs. Follow-up appointment                            with [place] in 3 months. Procedure Code(s):        --- Professional ---  18841, Esophagogastroduodenoscopy, flexible,                            transoral; diagnostic, including collection of                            specimen(s) by brushing or washing, when performed                            (separate procedure) Diagnosis Code(s):        --- Professional ---                           K44.9, Diaphragmatic hernia without obstruction or                            gangrene                           K27.9, Peptic ulcer, site unspecified, unspecified                            as acute or chronic, without hemorrhage or                            perforation CPT copyright 2019 American Medical Association. All rights reserved. The codes documented in this report are preliminary and upon coder review may  be revised to meet current compliance requirements. Bryan Atkinson. Bryan Finan, MD Norvel Richards, MD 06/15/2022 8:03:01 AM This report has been signed electronically. Number of Addenda: 0

## 2022-06-15 NOTE — Anesthesia Preprocedure Evaluation (Signed)
Anesthesia Evaluation  Patient identified by MRN, date of birth, ID band Patient awake    Reviewed: Allergy & Precautions, H&P , NPO status , Patient's Chart, lab work & pertinent test results, reviewed documented beta blocker date and time   Airway Mallampati: II  TM Distance: >3 FB Neck ROM: full    Dental no notable dental hx.    Pulmonary neg pulmonary ROS,    Pulmonary exam normal breath sounds clear to auscultation       Cardiovascular Exercise Tolerance: Good hypertension, negative cardio ROS   Rhythm:regular Rate:Normal     Neuro/Psych negative neurological ROS  negative psych ROS   GI/Hepatic Neg liver ROS, PUD, GERD  Medicated,  Endo/Other  negative endocrine ROSdiabetes, Type 2  Renal/GU ARFRenal disease  negative genitourinary   Musculoskeletal   Abdominal   Peds  Hematology  (+) Blood dyscrasia, anemia ,   Anesthesia Other Findings   Reproductive/Obstetrics negative OB ROS                             Anesthesia Physical  Anesthesia Plan  ASA: 3 and emergent  Anesthesia Plan: General   Post-op Pain Management:    Induction:   PONV Risk Score and Plan: Propofol infusion  Airway Management Planned:   Additional Equipment:   Intra-op Plan:   Post-operative Plan:   Informed Consent: I have reviewed the patients History and Physical, chart, labs and discussed the procedure including the risks, benefits and alternatives for the proposed anesthesia with the patient or authorized representative who has indicated his/her understanding and acceptance.     Dental Advisory Given  Plan Discussed with: CRNA  Anesthesia Plan Comments:         Anesthesia Quick Evaluation

## 2022-06-16 NOTE — Anesthesia Postprocedure Evaluation (Signed)
Anesthesia Post Note  Patient: Bryan Atkinson  Procedure(s) Performed: ESOPHAGOGASTRODUODENOSCOPY (EGD) WITH PROPOFOL BIOPSY  Patient location during evaluation: Phase II Anesthesia Type: General Level of consciousness: awake Pain management: pain level controlled Vital Signs Assessment: post-procedure vital signs reviewed and stable Respiratory status: spontaneous breathing and respiratory function stable Cardiovascular status: blood pressure returned to baseline and stable Postop Assessment: no headache and no apparent nausea or vomiting Anesthetic complications: no Comments: Late entry   No notable events documented.   Last Vitals:  Vitals:   06/15/22 0815 06/15/22 0821  BP: 124/79 137/89  Pulse: 70 65  Resp: 15 16  Temp:  (!) 36.4 C  SpO2: 96% 100%    Last Pain:  Vitals:   06/15/22 0821  TempSrc: Oral  PainSc: 0-No pain                 Louann Sjogren

## 2022-06-17 LAB — SURGICAL PATHOLOGY

## 2022-06-21 ENCOUNTER — Encounter: Payer: Self-pay | Admitting: Internal Medicine

## 2022-06-22 ENCOUNTER — Encounter (HOSPITAL_COMMUNITY): Payer: Self-pay | Admitting: Internal Medicine

## 2022-07-04 ENCOUNTER — Encounter: Payer: Self-pay | Admitting: *Deleted

## 2022-07-08 ENCOUNTER — Inpatient Hospital Stay: Payer: 59 | Attending: Physician Assistant

## 2022-07-08 DIAGNOSIS — N184 Chronic kidney disease, stage 4 (severe): Secondary | ICD-10-CM | POA: Diagnosis not present

## 2022-07-08 DIAGNOSIS — D631 Anemia in chronic kidney disease: Secondary | ICD-10-CM | POA: Insufficient documentation

## 2022-07-08 DIAGNOSIS — D509 Iron deficiency anemia, unspecified: Secondary | ICD-10-CM | POA: Insufficient documentation

## 2022-07-08 LAB — COMPREHENSIVE METABOLIC PANEL
ALT: 21 U/L (ref 0–44)
AST: 22 U/L (ref 15–41)
Albumin: 3.9 g/dL (ref 3.5–5.0)
Alkaline Phosphatase: 56 U/L (ref 38–126)
Anion gap: 7 (ref 5–15)
BUN: 42 mg/dL — ABNORMAL HIGH (ref 8–23)
CO2: 25 mmol/L (ref 22–32)
Calcium: 9.4 mg/dL (ref 8.9–10.3)
Chloride: 107 mmol/L (ref 98–111)
Creatinine, Ser: 2.41 mg/dL — ABNORMAL HIGH (ref 0.61–1.24)
GFR, Estimated: 29 mL/min — ABNORMAL LOW (ref >60)
Glucose, Bld: 109 mg/dL — ABNORMAL HIGH (ref 70–99)
Potassium: 3.6 mmol/L (ref 3.5–5.1)
Sodium: 139 mmol/L (ref 135–145)
Total Bilirubin: 0.4 mg/dL (ref 0.3–1.2)
Total Protein: 7.3 g/dL (ref 6.5–8.1)

## 2022-07-08 LAB — IRON AND TIBC
Iron: 55 ug/dL (ref 45–182)
Saturation Ratios: 16 % — ABNORMAL LOW (ref 17.9–39.5)
TIBC: 335 ug/dL (ref 250–450)
UIBC: 280 ug/dL

## 2022-07-08 LAB — CBC WITH DIFFERENTIAL/PLATELET
Abs Immature Granulocytes: 0.1 10*3/uL — ABNORMAL HIGH (ref 0.00–0.07)
Basophils Absolute: 0.1 10*3/uL (ref 0.0–0.1)
Basophils Relative: 1 %
Eosinophils Absolute: 0.3 10*3/uL (ref 0.0–0.5)
Eosinophils Relative: 3 %
HCT: 36.3 % — ABNORMAL LOW (ref 39.0–52.0)
Hemoglobin: 12.3 g/dL — ABNORMAL LOW (ref 13.0–17.0)
Immature Granulocytes: 1 %
Lymphocytes Relative: 22 %
Lymphs Abs: 2.2 10*3/uL (ref 0.7–4.0)
MCH: 31.5 pg (ref 26.0–34.0)
MCHC: 33.9 g/dL (ref 30.0–36.0)
MCV: 92.8 fL (ref 80.0–100.0)
Monocytes Absolute: 1 10*3/uL (ref 0.1–1.0)
Monocytes Relative: 10 %
Neutro Abs: 6.3 10*3/uL (ref 1.7–7.7)
Neutrophils Relative %: 63 %
Platelets: 300 10*3/uL (ref 150–400)
RBC: 3.91 MIL/uL — ABNORMAL LOW (ref 4.22–5.81)
RDW: 14.5 % (ref 11.5–15.5)
WBC: 10 10*3/uL (ref 4.0–10.5)
nRBC: 0 % (ref 0.0–0.2)

## 2022-07-08 LAB — FERRITIN: Ferritin: 288 ng/mL (ref 24–336)

## 2022-07-15 ENCOUNTER — Other Ambulatory Visit (HOSPITAL_COMMUNITY): Payer: 59

## 2022-07-15 ENCOUNTER — Inpatient Hospital Stay (HOSPITAL_BASED_OUTPATIENT_CLINIC_OR_DEPARTMENT_OTHER): Payer: 59 | Admitting: Physician Assistant

## 2022-07-15 DIAGNOSIS — N1832 Chronic kidney disease, stage 3b: Secondary | ICD-10-CM

## 2022-07-15 DIAGNOSIS — D500 Iron deficiency anemia secondary to blood loss (chronic): Secondary | ICD-10-CM | POA: Diagnosis not present

## 2022-07-15 DIAGNOSIS — D631 Anemia in chronic kidney disease: Secondary | ICD-10-CM

## 2022-07-15 NOTE — Progress Notes (Signed)
Virtual Visit via Telephone Note [facility]  I connected with Bryan Atkinson  on 07/15/22 at  1:24 PM by telephone and verified that I am speaking with the correct person using two identifiers.  Location: Patient: Home Provider: [facility]   I discussed the limitations, risks, security and privacy concerns of performing an evaluation and management service by telephone and the availability of in person appointments. I also discussed with the patient that there may be a patient responsible charge related to this service. The patient expressed understanding and agreed to proceed.  REASON FOR VISIT:  Follow-up for iron deficiency anemia   CURRENT THERAPY: Daily iron tablets with intermittent IV iron   INTERVAL HISTORY:  Bryan Atkinson 66 y.o. male returns for routine follow-up of his iron deficiency anemia.  He was last seen by Tarri Abernethy PA-C on 03/11/2022.  His last IV iron infusion was Venofer 300 mg x 3 from 05/02/2022 through 05/24/2022.  Since his last visit, he was hospitalized in June 2023 for acute blood loss anemia with presence of nonbleeding gastric ulcer seen on EGD from 03/25/2022.  He received 1 unit PRBC during hospitalization.    At today's visit, he reports feeling fairly well.   He is taking iron tablets once daily.  He has intermittent fatigue.  He denies any pica, headaches, chest pain, dyspnea on exertion, lightheadedness, or syncope.  He does have some occasional leg cramps.  He denies any B symptoms such as fever, chills, night sweats, unintentional weight loss.   He has 75% energy and 100% appetite. He endorses that he is maintaining a stable weight.    OBSERVATIONS/OBJECTIVE: Review of Systems  Constitutional:  Positive for malaise/fatigue. Negative for chills, diaphoresis, fever and weight loss.  Respiratory:  Positive for cough. Negative for shortness of breath.   Cardiovascular:  Negative for chest pain and palpitations.   Gastrointestinal:  Negative for abdominal pain, blood in stool, melena, nausea and vomiting.  Musculoskeletal:  Positive for back pain and myalgias (intermittent leg cramps).  Neurological:  Negative for dizziness and headaches.     PHYSICAL EXAM (per limitations of virtual telephone visit): The patient is alert and oriented x 3, exhibiting adequate mentation, good mood, and ability to speak in full sentences and execute sound judgement.   ASSESSMENT & PLAN: 1.  Normocytic anemia:  - Combination anemia from CKD stage IV and relative iron deficiency.  He also had a history of bleeding gastric ulcer which is healed now. - EGD on 05/25/2020 showed normal esophagus, small hiatal hernia, normal duodenum.  He had 2 gastric ulcers, the largest measuring 1.75 cm deep benign-appearing prepyloric antral ulcer with no bleeding stigmata.   - Hospitalized June 2023 for acute blood loss anemia, nonbleeding gastric ulcer seen on EGD (03/25/2022).  Received 1 unit PRBC during hospitalization. - Hematology work-up (06/01/2020): SPEP/IFE negative.  B12, MMA, copper levels were normal.  LDH was normal.  Free kappa light chains are 53.6, lambda light chain 62.9 and ratio of 0.85. - He is taking iron tablets once daily - He requires intermittent IV iron, most recently with Venofer 300 mg x 3 from 05/02/2022 to 05/24/2022 - No melena, epistaxis, or hematochezia   - His chronic fatigue is at baseline - Most recent labs (07/08/2022): Hgb 12.3/MCV 92.8, at baseline.  Ferritin 288, iron saturation 16%.  Baseline kidney function with creatinine 2.41/GFR 29 - PLAN: No indication for IV iron at this time. - Continue iron tablets once  daily   - We will consider ESA if hemoglobin drops to less than 10 despite adequate iron - Repeat labs and RTC in 4 months (phone visit preferred) - will plan on in person visit at least once a year   2.  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 - He is non-smoker.  - No B symptoms  - Most recent CBC with normal WBC and differential   3.  CKD stage IV - Patient follows with Dr. Theador Hawthorne.   4.  Social/family history: - He is a non-smoker.  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 - Father had lung cancer and mother had metastatic colon cancer.   FOLLOW UP INSTRUCTIONS:  Labs in 4 months (CBC/D+ CMP + ferritin + iron/TIBC) Phone visit after labs    I discussed the assessment and treatment plan with the patient. The patient was provided an opportunity to ask questions and all were answered. The patient agreed with the plan and demonstrated an understanding of the instructions.   The patient was advised to call back or seek an in-person evaluation if the symptoms worsen or if the condition fails to improve as anticipated.  I provided 18 minutes of non-face-to-face time during this encounter.   [name], PA-C 03/11/2022 07/15/2022

## 2022-07-22 ENCOUNTER — Telehealth (HOSPITAL_COMMUNITY): Payer: 59 | Admitting: Physician Assistant

## 2022-08-02 ENCOUNTER — Other Ambulatory Visit (HOSPITAL_COMMUNITY)
Admission: RE | Admit: 2022-08-02 | Discharge: 2022-08-02 | Disposition: A | Discharge status: Home / Self Care | Payer: 59 | Source: Ambulatory Visit | Attending: Nephrology | Admitting: Nephrology

## 2022-08-02 DIAGNOSIS — N189 Chronic kidney disease, unspecified: Secondary | ICD-10-CM | POA: Diagnosis present

## 2022-08-02 LAB — CBC
HCT: 38.9 % — ABNORMAL LOW (ref 39.0–52.0)
Hemoglobin: 12.8 g/dL — ABNORMAL LOW (ref 13.0–17.0)
MCH: 30.9 pg (ref 26.0–34.0)
MCHC: 32.9 g/dL (ref 30.0–36.0)
MCV: 94 fL (ref 80.0–100.0)
Platelets: 314 10*3/uL (ref 150–400)
RBC: 4.14 MIL/uL — ABNORMAL LOW (ref 4.22–5.81)
RDW: 14.4 % (ref 11.5–15.5)
WBC: 12.9 10*3/uL — ABNORMAL HIGH (ref 4.0–10.5)
nRBC: 0 % (ref 0.0–0.2)

## 2022-08-02 LAB — RENAL FUNCTION PANEL
Albumin: 3.9 g/dL (ref 3.5–5.0)
Anion gap: 10 (ref 5–15)
BUN: 54 mg/dL — ABNORMAL HIGH (ref 8–23)
CO2: 25 mmol/L (ref 22–32)
Calcium: 9.8 mg/dL (ref 8.9–10.3)
Chloride: 103 mmol/L (ref 98–111)
Creatinine, Ser: 2.73 mg/dL — ABNORMAL HIGH (ref 0.61–1.24)
GFR, Estimated: 25 mL/min — ABNORMAL LOW (ref >60)
Glucose, Bld: 157 mg/dL — ABNORMAL HIGH (ref 70–99)
Phosphorus: 3 mg/dL (ref 2.5–4.6)
Potassium: 4.1 mmol/L (ref 3.5–5.1)
Sodium: 138 mmol/L (ref 135–145)

## 2022-08-02 LAB — PROTEIN / CREATININE RATIO, URINE
Creatinine, Urine: 66.16 mg/dL
Total Protein, Urine: <6 mg/dL

## 2022-08-02 LAB — IRON AND TIBC
Iron: 47 ug/dL (ref 45–182)
Saturation Ratios: 14 % — ABNORMAL LOW (ref 17.9–39.5)
TIBC: 333 ug/dL (ref 250–450)
UIBC: 286 ug/dL

## 2022-10-13 ENCOUNTER — Encounter: Payer: Self-pay | Admitting: Internal Medicine

## 2022-11-11 ENCOUNTER — Other Ambulatory Visit (HOSPITAL_COMMUNITY)
Admission: RE | Admit: 2022-11-11 | Discharge: 2022-11-11 | Disposition: A | Discharge status: Home / Self Care | Payer: 59 | Source: Ambulatory Visit | Attending: Nephrology | Admitting: Nephrology

## 2022-11-11 ENCOUNTER — Inpatient Hospital Stay: Payer: 59 | Attending: Hematology

## 2022-11-11 DIAGNOSIS — D631 Anemia in chronic kidney disease: Secondary | ICD-10-CM | POA: Insufficient documentation

## 2022-11-11 DIAGNOSIS — N184 Chronic kidney disease, stage 4 (severe): Secondary | ICD-10-CM | POA: Insufficient documentation

## 2022-11-11 DIAGNOSIS — D72829 Elevated white blood cell count, unspecified: Secondary | ICD-10-CM | POA: Insufficient documentation

## 2022-11-11 DIAGNOSIS — D509 Iron deficiency anemia, unspecified: Secondary | ICD-10-CM | POA: Insufficient documentation

## 2022-11-11 DIAGNOSIS — D500 Iron deficiency anemia secondary to blood loss (chronic): Secondary | ICD-10-CM

## 2022-11-11 DIAGNOSIS — N189 Chronic kidney disease, unspecified: Secondary | ICD-10-CM | POA: Insufficient documentation

## 2022-11-11 LAB — COMPREHENSIVE METABOLIC PANEL
ALT: 20 U/L (ref 0–44)
AST: 24 U/L (ref 15–41)
Albumin: 3.8 g/dL (ref 3.5–5.0)
Alkaline Phosphatase: 48 U/L (ref 38–126)
Anion gap: 9 (ref 5–15)
BUN: 35 mg/dL — ABNORMAL HIGH (ref 8–23)
CO2: 23 mmol/L (ref 22–32)
Calcium: 9.5 mg/dL (ref 8.9–10.3)
Chloride: 105 mmol/L (ref 98–111)
Creatinine, Ser: 2.07 mg/dL — ABNORMAL HIGH (ref 0.61–1.24)
GFR, Estimated: 35 mL/min — ABNORMAL LOW (ref >60)
Glucose, Bld: 165 mg/dL — ABNORMAL HIGH (ref 70–99)
Potassium: 3.8 mmol/L (ref 3.5–5.1)
Sodium: 137 mmol/L (ref 135–145)
Total Bilirubin: 0.5 mg/dL (ref 0.3–1.2)
Total Protein: 7.3 g/dL (ref 6.5–8.1)

## 2022-11-11 LAB — CBC WITH DIFFERENTIAL/PLATELET
Abs Immature Granulocytes: 0.09 10*3/uL — ABNORMAL HIGH (ref 0.00–0.07)
Basophils Absolute: 0.1 10*3/uL (ref 0.0–0.1)
Basophils Relative: 1 %
Eosinophils Absolute: 0.1 10*3/uL (ref 0.0–0.5)
Eosinophils Relative: 1 %
HCT: 38.1 % — ABNORMAL LOW (ref 39.0–52.0)
Hemoglobin: 12.8 g/dL — ABNORMAL LOW (ref 13.0–17.0)
Immature Granulocytes: 1 %
Lymphocytes Relative: 20 %
Lymphs Abs: 1.8 10*3/uL (ref 0.7–4.0)
MCH: 31.7 pg (ref 26.0–34.0)
MCHC: 33.6 g/dL (ref 30.0–36.0)
MCV: 94.3 fL (ref 80.0–100.0)
Monocytes Absolute: 0.9 10*3/uL (ref 0.1–1.0)
Monocytes Relative: 9 %
Neutro Abs: 6.4 10*3/uL (ref 1.7–7.7)
Neutrophils Relative %: 68 %
Platelets: 296 10*3/uL (ref 150–400)
RBC: 4.04 MIL/uL — ABNORMAL LOW (ref 4.22–5.81)
RDW: 13.7 % (ref 11.5–15.5)
WBC: 9.3 10*3/uL (ref 4.0–10.5)
nRBC: 0 % (ref 0.0–0.2)

## 2022-11-11 LAB — PROTEIN / CREATININE RATIO, URINE
Creatinine, Urine: 79.23 mg/dL
Protein Creatinine Ratio: 0.28 mg/mg{Cre} — ABNORMAL HIGH (ref 0.00–0.15)
Total Protein, Urine: 22 mg/dL

## 2022-11-11 LAB — IRON AND TIBC
Iron: 76 ug/dL (ref 45–182)
Saturation Ratios: 24 % (ref 17.9–39.5)
TIBC: 314 ug/dL (ref 250–450)
UIBC: 238 ug/dL

## 2022-11-11 LAB — FERRITIN: Ferritin: 271 ng/mL (ref 24–336)

## 2022-11-13 LAB — PTH, INTACT AND CALCIUM
Calcium, Total (PTH): 9.7 mg/dL (ref 8.6–10.2)
PTH: 56 pg/mL (ref 15–65)

## 2022-11-18 ENCOUNTER — Inpatient Hospital Stay: Payer: 59 | Attending: Physician Assistant | Admitting: Physician Assistant

## 2022-11-18 DIAGNOSIS — N1832 Chronic kidney disease, stage 3b: Secondary | ICD-10-CM

## 2022-11-18 DIAGNOSIS — D500 Iron deficiency anemia secondary to blood loss (chronic): Secondary | ICD-10-CM | POA: Diagnosis not present

## 2022-11-18 DIAGNOSIS — D631 Anemia in chronic kidney disease: Secondary | ICD-10-CM | POA: Diagnosis not present

## 2022-11-18 NOTE — Progress Notes (Signed)
VIRTUAL VISIT via [place]   I connected with Bryan Atkinson  on 11/18/2022 at 2:17 PM by telephone and verified that I am speaking with the correct person using two identifiers.  Location: Patient: Home Provider: [facility]   I discussed the limitations, risks, security and privacy concerns of performing an evaluation and management service by telephone and the availability of in person appointments. I also discussed with the patient that there may be a patient responsible charge related to this service. The patient expressed understanding and agreed to proceed.  REASON FOR VISIT: Follow-up for iron deficiency anemia with anemia of CKD stage IV  CURRENT THERAPY: Daily iron tablets with intermittent IV iron  INTERVAL HISTORY:  [name] is contacted today for follow-up of his iron deficiency anemia and anemia of CKD.  He was last evaluated via telemedicine visit by Tarri Abernethy PA-C on 07/15/2022.   At today's visit, he reports feeling fairly well.   He is taking iron tablets once daily.  He has intermittent fatigue.  He denies any pica, headaches, chest pain, dyspnea on exertion, lightheadedness, or syncope.  He does have some occasional leg cramps.  He denies any B symptoms such as fever, chills, night sweats, unintentional weight loss.  He denies any recent rectal bleeding or melena.  He reports 75% energy and 100% appetite.  He reports that he is maintaining a stable weight.  REVIEW OF SYSTEMS:   Review of Systems  Constitutional:  Negative for chills, diaphoresis, fever, malaise/fatigue and weight loss.  Respiratory:  Positive for cough. Negative for shortness of breath.   Cardiovascular:  Negative for chest pain and palpitations.  Gastrointestinal:  Positive for constipation. Negative for abdominal pain, blood in stool, melena, nausea and vomiting.  Neurological:  Positive for dizziness. Negative for headaches.      PHYSICAL EXAM: (per limitations of virtual telephone visit)  The patient is alert and oriented x 3, exhibiting adequate mentation, good mood, and ability to speak in full sentences and execute sound judgement.  ASSESSMENT & PLAN:  1.  Normocytic anemia:  - Combination anemia from CKD stage IV and relative iron deficiency.  He also had a history of bleeding gastric ulcer  - EGD on 05/25/2020 showed normal esophagus, small hiatal hernia, normal duodenum.  He had 2 gastric ulcers, the largest measuring 1.75 cm deep benign-appearing prepyloric antral ulcer with no bleeding stigmata.   - Hospitalized June 2023 for acute blood loss anemia, nonbleeding gastric ulcer seen on EGD (03/25/2022).  Received 1 unit PRBC during hospitalization. - Hematology work-up (06/01/2020): SPEP/IFE negative.  B12, MMA, copper levels were normal.  LDH was normal.  Free kappa light chains are 53.6, lambda light chain 62.9 and ratio of 0.85. - He is taking iron tablets once daily - He requires intermittent IV iron, most recently with Venofer 300 mg x 3 from 05/02/2022 to 05/24/2022 - No melena, epistaxis, or hematochezia   - His chronic fatigue is at baseline - Most recent labs (11/11/2022): Hgb 12.8/MCV 94.3, at baseline.  Ferritin 271, iron saturation 24%.  Baseline kidney function with creatinine 2.07/GFR 35 - PLAN: No indication for IV iron at this time. - Continue iron tablets once daily   - We will consider ESA if hemoglobin drops to less than 10 despite adequate iron - Repeat labs and RTC in 6 months (phone visit preferred) - will plan on in person visit at least once per calendar year  2.  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 - He is non-smoker.  - No B symptoms  - Most recent CBC with normal WBC and differential   3.  CKD stage IV - Patient follows with Dr. Theador Hawthorne.   4.  Social/family  history: - He is a non-smoker.  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 - Father had lung cancer and mother had metastatic colon cancer.  PLAN SUMMARY: >> Labs in 6 months (CBC/D, BMP, ferritin, iron/TIBC) >> PHONE visit after labs     I discussed the assessment and treatment plan with the patient. The patient was provided an opportunity to ask questions and all were answered. The patient agreed with the plan and demonstrated an understanding of the instructions.   The patient was advised to call back or seek an in-person evaluation if the symptoms worsen or if the condition fails to improve as anticipated.  I provided 18 minutes of non-face-to-face time during this encounter.   [name], PA-C 11/18/22 2:37 PM

## 2022-11-21 ENCOUNTER — Other Ambulatory Visit: Payer: Self-pay

## 2022-11-21 DIAGNOSIS — D509 Iron deficiency anemia, unspecified: Secondary | ICD-10-CM

## 2022-11-21 DIAGNOSIS — N1832 Chronic kidney disease, stage 3b: Secondary | ICD-10-CM

## 2022-11-21 DIAGNOSIS — D500 Iron deficiency anemia secondary to blood loss (chronic): Secondary | ICD-10-CM

## 2022-11-21 DIAGNOSIS — D649 Anemia, unspecified: Secondary | ICD-10-CM

## 2023-02-22 ENCOUNTER — Encounter (HOSPITAL_COMMUNITY): Payer: Self-pay | Admitting: Hematology

## 2023-02-22 ENCOUNTER — Other Ambulatory Visit (HOSPITAL_COMMUNITY)
Admission: RE | Admit: 2023-02-22 | Discharge: 2023-02-22 | Disposition: A | Discharge status: Home / Self Care | Payer: Medicare HMO | Source: Ambulatory Visit | Attending: Nephrology | Admitting: Nephrology

## 2023-02-22 DIAGNOSIS — N189 Chronic kidney disease, unspecified: Secondary | ICD-10-CM | POA: Insufficient documentation

## 2023-02-22 LAB — CBC
HCT: 39.4 % (ref 39.0–52.0)
Hemoglobin: 12.9 g/dL — ABNORMAL LOW (ref 13.0–17.0)
MCH: 31.5 pg (ref 26.0–34.0)
MCHC: 32.7 g/dL (ref 30.0–36.0)
MCV: 96.1 fL (ref 80.0–100.0)
Platelets: 320 10*3/uL (ref 150–400)
RBC: 4.1 MIL/uL — ABNORMAL LOW (ref 4.22–5.81)
RDW: 13.7 % (ref 11.5–15.5)
WBC: 10.1 10*3/uL (ref 4.0–10.5)
nRBC: 0 % (ref 0.0–0.2)

## 2023-02-22 LAB — RENAL FUNCTION PANEL
Albumin: 4 g/dL (ref 3.5–5.0)
Anion gap: 11 (ref 5–15)
BUN: 50 mg/dL — ABNORMAL HIGH (ref 8–23)
CO2: 24 mmol/L (ref 22–32)
Calcium: 9.9 mg/dL (ref 8.9–10.3)
Chloride: 102 mmol/L (ref 98–111)
Creatinine, Ser: 2.6 mg/dL — ABNORMAL HIGH (ref 0.61–1.24)
GFR, Estimated: 27 mL/min — ABNORMAL LOW (ref >60)
Glucose, Bld: 148 mg/dL — ABNORMAL HIGH (ref 70–99)
Phosphorus: 3.2 mg/dL (ref 2.5–4.6)
Potassium: 4.2 mmol/L (ref 3.5–5.1)
Sodium: 137 mmol/L (ref 135–145)

## 2023-02-22 LAB — PROTEIN / CREATININE RATIO, URINE
Creatinine, Urine: 33 mg/dL
Total Protein, Urine: <6 mg/dL

## 2023-04-04 ENCOUNTER — Other Ambulatory Visit (HOSPITAL_COMMUNITY)
Admission: RE | Admit: 2023-04-04 | Discharge: 2023-04-04 | Disposition: A | Discharge status: Home / Self Care | Payer: Medicare HMO | Source: Ambulatory Visit | Attending: Nephrology | Admitting: Nephrology

## 2023-04-04 DIAGNOSIS — R809 Proteinuria, unspecified: Secondary | ICD-10-CM | POA: Insufficient documentation

## 2023-04-04 DIAGNOSIS — N189 Chronic kidney disease, unspecified: Secondary | ICD-10-CM | POA: Diagnosis not present

## 2023-04-04 DIAGNOSIS — N179 Acute kidney failure, unspecified: Secondary | ICD-10-CM | POA: Insufficient documentation

## 2023-04-04 DIAGNOSIS — E1122 Type 2 diabetes mellitus with diabetic chronic kidney disease: Secondary | ICD-10-CM | POA: Diagnosis not present

## 2023-04-04 DIAGNOSIS — I5032 Chronic diastolic (congestive) heart failure: Secondary | ICD-10-CM | POA: Insufficient documentation

## 2023-04-04 DIAGNOSIS — I129 Hypertensive chronic kidney disease with stage 1 through stage 4 chronic kidney disease, or unspecified chronic kidney disease: Secondary | ICD-10-CM | POA: Insufficient documentation

## 2023-04-04 DIAGNOSIS — D631 Anemia in chronic kidney disease: Secondary | ICD-10-CM | POA: Diagnosis not present

## 2023-04-04 LAB — CBC WITH DIFFERENTIAL/PLATELET
Abs Immature Granulocytes: 0.11 10*3/uL — ABNORMAL HIGH (ref 0.00–0.07)
Basophils Absolute: 0.1 10*3/uL (ref 0.0–0.1)
Basophils Relative: 1 %
Eosinophils Absolute: 0.4 10*3/uL (ref 0.0–0.5)
Eosinophils Relative: 4 %
HCT: 38.3 % — ABNORMAL LOW (ref 39.0–52.0)
Hemoglobin: 12.6 g/dL — ABNORMAL LOW (ref 13.0–17.0)
Immature Granulocytes: 1 %
Lymphocytes Relative: 17 %
Lymphs Abs: 1.7 10*3/uL (ref 0.7–4.0)
MCH: 31 pg (ref 26.0–34.0)
MCHC: 32.9 g/dL (ref 30.0–36.0)
MCV: 94.3 fL (ref 80.0–100.0)
Monocytes Absolute: 0.8 10*3/uL (ref 0.1–1.0)
Monocytes Relative: 8 %
Neutro Abs: 6.9 10*3/uL (ref 1.7–7.7)
Neutrophils Relative %: 69 %
Platelets: 326 10*3/uL (ref 150–400)
RBC: 4.06 MIL/uL — ABNORMAL LOW (ref 4.22–5.81)
RDW: 13.7 % (ref 11.5–15.5)
WBC: 9.9 10*3/uL (ref 4.0–10.5)
nRBC: 0 % (ref 0.0–0.2)

## 2023-04-04 LAB — RENAL FUNCTION PANEL
Albumin: 3.8 g/dL (ref 3.5–5.0)
Anion gap: 11 (ref 5–15)
BUN: 46 mg/dL — ABNORMAL HIGH (ref 8–23)
CO2: 23 mmol/L (ref 22–32)
Calcium: 9.5 mg/dL (ref 8.9–10.3)
Chloride: 100 mmol/L (ref 98–111)
Creatinine, Ser: 2.55 mg/dL — ABNORMAL HIGH (ref 0.61–1.24)
GFR, Estimated: 27 mL/min — ABNORMAL LOW (ref >60)
Glucose, Bld: 226 mg/dL — ABNORMAL HIGH (ref 70–99)
Phosphorus: 3 mg/dL (ref 2.5–4.6)
Potassium: 3.9 mmol/L (ref 3.5–5.1)
Sodium: 134 mmol/L — ABNORMAL LOW (ref 135–145)

## 2023-04-04 LAB — PROTEIN / CREATININE RATIO, URINE
Creatinine, Urine: 47 mg/dL
Total Protein, Urine: <6 mg/dL

## 2023-05-19 ENCOUNTER — Inpatient Hospital Stay: Payer: Medicare HMO | Attending: Hematology

## 2023-05-19 DIAGNOSIS — D500 Iron deficiency anemia secondary to blood loss (chronic): Secondary | ICD-10-CM

## 2023-05-19 DIAGNOSIS — D509 Iron deficiency anemia, unspecified: Secondary | ICD-10-CM | POA: Diagnosis present

## 2023-05-19 DIAGNOSIS — N184 Chronic kidney disease, stage 4 (severe): Secondary | ICD-10-CM | POA: Diagnosis present

## 2023-05-19 DIAGNOSIS — D631 Anemia in chronic kidney disease: Secondary | ICD-10-CM | POA: Insufficient documentation

## 2023-05-19 DIAGNOSIS — D649 Anemia, unspecified: Secondary | ICD-10-CM

## 2023-05-19 LAB — IRON AND TIBC
Iron: 63 ug/dL (ref 45–182)
Saturation Ratios: 19 % (ref 17.9–39.5)
TIBC: 328 ug/dL (ref 250–450)
UIBC: 265 ug/dL

## 2023-05-19 LAB — BASIC METABOLIC PANEL
Anion gap: 9 (ref 5–15)
BUN: 47 mg/dL — ABNORMAL HIGH (ref 8–23)
CO2: 23 mmol/L (ref 22–32)
Calcium: 9.5 mg/dL (ref 8.9–10.3)
Chloride: 103 mmol/L (ref 98–111)
Creatinine, Ser: 2.29 mg/dL — ABNORMAL HIGH (ref 0.61–1.24)
GFR, Estimated: 31 mL/min — ABNORMAL LOW (ref >60)
Glucose, Bld: 206 mg/dL — ABNORMAL HIGH (ref 70–99)
Potassium: 4.2 mmol/L (ref 3.5–5.1)
Sodium: 135 mmol/L (ref 135–145)

## 2023-05-19 LAB — CBC WITH DIFFERENTIAL/PLATELET
Abs Immature Granulocytes: 0.18 10*3/uL — ABNORMAL HIGH (ref 0.00–0.07)
Basophils Absolute: 0.1 10*3/uL (ref 0.0–0.1)
Basophils Relative: 1 %
Eosinophils Absolute: 0.4 10*3/uL (ref 0.0–0.5)
Eosinophils Relative: 3 %
HCT: 39.2 % (ref 39.0–52.0)
Hemoglobin: 12.9 g/dL — ABNORMAL LOW (ref 13.0–17.0)
Immature Granulocytes: 2 %
Lymphocytes Relative: 18 %
Lymphs Abs: 2.1 10*3/uL (ref 0.7–4.0)
MCH: 31.2 pg (ref 26.0–34.0)
MCHC: 32.9 g/dL (ref 30.0–36.0)
MCV: 94.9 fL (ref 80.0–100.0)
Monocytes Absolute: 1.1 10*3/uL — ABNORMAL HIGH (ref 0.1–1.0)
Monocytes Relative: 9 %
Neutro Abs: 8.1 10*3/uL — ABNORMAL HIGH (ref 1.7–7.7)
Neutrophils Relative %: 67 %
Platelets: 310 10*3/uL (ref 150–400)
RBC: 4.13 MIL/uL — ABNORMAL LOW (ref 4.22–5.81)
RDW: 13.8 % (ref 11.5–15.5)
WBC: 11.9 10*3/uL — ABNORMAL HIGH (ref 4.0–10.5)
nRBC: 0 % (ref 0.0–0.2)

## 2023-05-19 LAB — FERRITIN: Ferritin: 306 ng/mL (ref 24–336)

## 2023-05-25 NOTE — Progress Notes (Signed)
VIRTUAL VISIT via TELEPHONE NOTE [facility]   I connected with Bryan Atkinson  on 05/26/23 at 1:31 PM by telephone and verified that I am speaking with the correct person using two identifiers.  Location: Patient: Home Provider: [facility]   I discussed the limitations, risks, security and privacy concerns of performing an evaluation and management service by telephone and the availability of in person appointments. I also discussed with the patient that there may be a patient responsible charge related to this service. The patient expressed understanding and agreed to proceed.  REASON FOR VISIT: Follow-up for iron deficiency anemia with anemia of CKD stage IV  CURRENT THERAPY: Daily iron tablets with intermittent IV iron  INTERVAL HISTORY:  [name] is contacted today for follow-up of his iron deficiency anemia and anemia of CKD.  He was last evaluated via telemedicine visit by Rojelio Brenner PA-C on 11/18/2022.   At today's visit, he reports feeling fairly well.  He is taking iron tablets once daily.  He has intermittent fatigue.   He denies any pica, headaches, chest pain, dyspnea on exertion, lightheadedness, or syncope.  He does have some occasional leg cramps.  He denies any B symptoms such as fever, chills, night sweats, unintentional weight loss.  He denies any recent rectal bleeding or melena.  He reports 75% energy and 100% appetite.  He reports that he is maintaining a stable weight.  REVIEW OF SYSTEMS:   Review of Systems  Constitutional:  Negative for chills, diaphoresis, fever, malaise/fatigue and weight loss.  Respiratory:  Negative for cough and shortness of breath.   Cardiovascular:  Negative for chest pain and palpitations.  Gastrointestinal:  Positive for constipation. Negative for abdominal pain, blood in stool, melena, nausea and vomiting.  Neurological:  Negative for dizziness and headaches.     PHYSICAL  EXAM: (per limitations of virtual telephone visit)  The patient is alert and oriented x 3, exhibiting adequate mentation, good mood, and ability to speak in full sentences and execute sound judgement.  ASSESSMENT & PLAN:  1.  Normocytic anemia:  - Combination anemia from CKD stage IV and relative iron deficiency.  He also had a history of bleeding gastric ulcer  - EGD on 05/25/2020 showed normal esophagus, small hiatal hernia, normal duodenum.  He had 2 gastric ulcers, the largest measuring 1.75 cm deep benign-appearing prepyloric antral ulcer with no bleeding stigmata.   - Hospitalized June 2023 for acute blood loss anemia, nonbleeding gastric ulcer seen on EGD (03/25/2022).  Received 1 unit PRBC during hospitalization. - Hematology work-up (06/01/2020): SPEP/IFE negative.  B12, MMA, copper levels were normal.  LDH was normal.  Free kappa light chains are 53.6, lambda light chain 62.9 and ratio of 0.85. - He is taking iron tablets once daily - He requires intermittent IV iron, most recently with Venofer 300 mg x 3 from 05/02/2022 to 05/24/2022 - No melena, epistaxis, or hematochezia   - His chronic fatigue is at baseline - Most recent labs (05/19/2023): Hgb 12.9/MCV 94.9, at baseline.  Ferritin 306, iron saturation 19%.  Baseline kidney function with creatinine 2.29/GFR 31 - PLAN: No indication for IV iron at this time. - Recommend switching iron tablets to EVERY OTHER DAY and adding stool softener to alleviate constipation - We will consider ESA if hemoglobin drops to less than 10 despite adequate iron - Repeat labs and RTC in 6 months for OFFICE visit (phone visit preferred) - will plan on in  person visit at least once per calendar year   2.  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 - He is non-smoker.  - No B symptoms  - Most recent CBC (05/19/2023) with WBC 11.9/ANC 8.1/monocytes 1.1   3.   CKD stage IV - Patient follows with Dr. Wolfgang Phoenix.   4.  Social/family history: - He is a non-smoker.  Worked at the [employer] until retiring in 2024 - Father had lung cancer and mother had metastatic colon cancer.  PLAN SUMMARY: >> Labs in 6 months (CBC/D, BMP, ferritin, iron/TIBC) >> OFFICE visit after labs  **Last office visit 11/04/2021     I discussed the assessment and treatment plan with the patient. The patient was provided an opportunity to ask questions and all were answered. The patient agreed with the plan and demonstrated an understanding of the instructions.   The patient was advised to call back or seek an in-person evaluation if the symptoms worsen or if the condition fails to improve as anticipated.  I provided 12 minutes of non-face-to-face time during this encounter.  Carnella Guadalajara, PA-C 05/26/23 1:43 PM

## 2023-05-26 ENCOUNTER — Inpatient Hospital Stay: Payer: Medicare HMO | Admitting: Physician Assistant

## 2023-05-26 DIAGNOSIS — D509 Iron deficiency anemia, unspecified: Secondary | ICD-10-CM

## 2023-05-26 DIAGNOSIS — N184 Chronic kidney disease, stage 4 (severe): Secondary | ICD-10-CM | POA: Diagnosis not present

## 2023-05-26 DIAGNOSIS — D631 Anemia in chronic kidney disease: Secondary | ICD-10-CM | POA: Diagnosis not present

## 2023-06-05 LAB — HM DIABETES EYE EXAM

## 2023-07-18 ENCOUNTER — Encounter (HOSPITAL_COMMUNITY): Payer: Self-pay | Admitting: Hematology

## 2023-07-19 ENCOUNTER — Encounter: Payer: Self-pay | Admitting: Nurse Practitioner

## 2023-07-19 ENCOUNTER — Ambulatory Visit: Payer: Medicare HMO | Admitting: Nurse Practitioner

## 2023-07-19 VITALS — BP 135/79 | HR 95 | Temp 98.7°F | Ht 69.0 in | Wt 250.6 lb

## 2023-07-19 DIAGNOSIS — I10 Essential (primary) hypertension: Secondary | ICD-10-CM | POA: Diagnosis not present

## 2023-07-19 DIAGNOSIS — E1121 Type 2 diabetes mellitus with diabetic nephropathy: Secondary | ICD-10-CM

## 2023-07-19 DIAGNOSIS — D500 Iron deficiency anemia secondary to blood loss (chronic): Secondary | ICD-10-CM | POA: Diagnosis not present

## 2023-07-19 DIAGNOSIS — Z0001 Encounter for general adult medical examination with abnormal findings: Secondary | ICD-10-CM

## 2023-07-19 DIAGNOSIS — Z23 Encounter for immunization: Secondary | ICD-10-CM | POA: Diagnosis not present

## 2023-07-19 DIAGNOSIS — M109 Gout, unspecified: Secondary | ICD-10-CM | POA: Insufficient documentation

## 2023-07-19 DIAGNOSIS — Z125 Encounter for screening for malignant neoplasm of prostate: Secondary | ICD-10-CM | POA: Diagnosis not present

## 2023-07-19 DIAGNOSIS — Z7984 Long term (current) use of oral hypoglycemic drugs: Secondary | ICD-10-CM

## 2023-07-19 DIAGNOSIS — L03039 Cellulitis of unspecified toe: Secondary | ICD-10-CM | POA: Insufficient documentation

## 2023-07-19 DIAGNOSIS — Z0000 Encounter for general adult medical examination without abnormal findings: Secondary | ICD-10-CM | POA: Insufficient documentation

## 2023-07-19 DIAGNOSIS — K219 Gastro-esophageal reflux disease without esophagitis: Secondary | ICD-10-CM

## 2023-07-19 DIAGNOSIS — E669 Obesity, unspecified: Secondary | ICD-10-CM | POA: Insufficient documentation

## 2023-07-19 DIAGNOSIS — J310 Chronic rhinitis: Secondary | ICD-10-CM | POA: Insufficient documentation

## 2023-07-19 DIAGNOSIS — M1A2790 Drug-induced chronic gout, unspecified ankle and foot, without tophus (tophi): Secondary | ICD-10-CM

## 2023-07-19 LAB — BAYER DCA HB A1C WAIVED: HB A1C (BAYER DCA - WAIVED): 6.3 % — ABNORMAL HIGH (ref 4.8–5.6)

## 2023-07-19 NOTE — Progress Notes (Signed)
New Patient Office Visit  Subjective    Patient ID: Bryan Atkinson, male    DOB: 08-10-1956  Age: 67 y.o. MRN: [id number]  CC:  Chief Complaint  Patient presents with   Establish Care    HPI Bryan Atkinson presents to establish care. PMH kidney  The patient is a 67 year old male with a complex medical history, including diabetes mellitus, end-stage kidney disease, gout, anemia for which he receives iron transfusions, gastroesophageal reflux disease (GERD), and hyperlipidemia. He reports that he has not needed an iron infusion this year. His last HbA1c was 5.6%, but today's result shows an increase to 6.3%. He expresses concern about the rise in his A1c and is interested in discussing potential adjustments to his diabetes management plan. The patient denies new symptoms such as increased thirst, frequent urination, or changes in appetite. He is also interested in reviewing his medications to ensure optimal control of his diabetes and other comorbid conditions.  Outpatient Encounter Medications as of 07/19/2023  Medication Sig   acetaminophen (TYLENOL) 500 MG tablet Take 500 mg by mouth every 6 (six) hours as needed for mild pain.   allopurinol (ZYLOPRIM) 300 MG tablet Take 300 mg by mouth daily.   amLODipine (NORVASC) 5 MG tablet Take 0.5 tablets (2.5 mg total) by mouth daily.   Apple Cider Vinegar 500 MG TABS Take 500 mg by mouth daily.    aspirin EC 81 MG tablet Take 81 mg by mouth daily. Swallow whole.   carvedilol (COREG) 25 MG tablet Take 25 mg by mouth 2 (two) times daily.   Co-Enzyme Q10 100 MG CAPS Take 100 mg by mouth daily.    Cranberry 500 MG TABS Take 500 mg by mouth daily.   FARXIGA 5 MG TABS tablet Take 5 mg by mouth every morning.   fenofibrate (TRICOR) 145 MG tablet Take 145 mg by mouth daily.   ferrous sulfate 325 (65 FE) MG tablet Take 325 mg by mouth daily with breakfast.   Flaxseed, Linseed, (FLAXSEED OIL) 1000 MG CAPS Take 1,000 mg by mouth daily.    furosemide (LASIX) 20 MG tablet Take 1 tablet (20 mg total) by mouth 2 (two) times daily.   glipiZIDE (GLUCOTROL XL) 10 MG 24 hr tablet Take 10 mg by mouth daily with breakfast.   glucose blood test strip 1 each by Other route as needed for other. Use as instructed   hydrALAZINE (APRESOLINE) 100 MG tablet Take 50 mg by mouth 2 (two) times daily.   Krill Oil 500 MG CAPS Take 500 mg by mouth 2 (two) times a day.   losartan (COZAAR) 25 MG tablet Take 12.5 mg by mouth daily.   Multiple Vitamin (MULTIVITAMIN) tablet Take 1 tablet by mouth daily.   Niacin CR 1000 MG TBCR Take 1,000 mg by mouth every evening.   pantoprazole (PROTONIX) 40 MG tablet Take 40 mg by mouth 2 (two) times daily.   pravastatin (PRAVACHOL) 40 MG tablet Take 40 mg by mouth at bedtime.    traZODone (DESYREL) 50 MG tablet Take 50 mg by mouth at bedtime.    Turmeric 500 MG CAPS Take 500 mg by mouth daily.   No facility-administered encounter medications on file as of 07/19/2023.    Past Medical History:  Diagnosis Date   Anemia    Arthritis    Diabetes mellitus without complication (HCC)    GERD (gastroesophageal reflux disease)    Gout    History of kidney stones    Hyperlipidemia  Hypertension     Past Surgical History:  Procedure Laterality Date   BIOPSY  05/25/2020   Procedure: BIOPSY;  Surgeon: Corbin Ade, MD;  [facility];  Service: Endoscopy;;   BIOPSY  03/25/2022   Procedure: BIOPSY;  Surgeon: Dolores Frame, MD;  [facility]: [facility];  Service: Gastroenterology;;   BIOPSY  06/15/2022   Procedure: BIOPSY;  Surgeon: Corbin Ade, MD;  [facility];  Service: Endoscopy;;   CATARACT EXTRACTION W/PHACO Right 05/13/2019   Procedure: CATARACT EXTRACTION PHACO AND INTRAOCULAR LENS PLACEMENT (IOC);  Surgeon: Fabio Pierce, MD;  Location: [facility];  Service: Ophthalmology;  Laterality: Right;  CDE: 10.31   CATARACT EXTRACTION W/PHACO Left 05/27/2019   Procedure: CATARACT  EXTRACTION PHACO AND INTRAOCULAR LENS PLACEMENT (IOC);  Surgeon: Fabio Pierce, MD;  Location: [facility];  Service: Ophthalmology;  Laterality: Left;  CDE: 6.49   COLONOSCOPY     times two 2002 ,2007   COLONOSCOPY N/A 10/07/2013   Procedure: COLONOSCOPY;  Surgeon: Corbin Ade, MD;  [facility];  Service: Endoscopy;  Laterality: N/A;  8:30 AM   COLONOSCOPY N/A 10/05/2018   Dr. Jena Gauss: Diverticulosis.  Next colonoscopy 5 years due to first-degree relative with colon cancer   ESOPHAGOGASTRODUODENOSCOPY (EGD) WITH PROPOFOL N/A 05/25/2020   [name]: 2 gastric ulcers.  No H. pylori.  Likely NSAID related.  Repeat EGD in 3 months   ESOPHAGOGASTRODUODENOSCOPY (EGD) WITH PROPOFOL N/A 09/17/2020   Procedure: ESOPHAGOGASTRODUODENOSCOPY (EGD) WITH PROPOFOL;  Surgeon: Corbin Ade, MD;  [facility];  Service: Endoscopy;  Laterality: N/A;  10:00am   ESOPHAGOGASTRODUODENOSCOPY (EGD) WITH PROPOFOL N/A 03/25/2022   Procedure: ESOPHAGOGASTRODUODENOSCOPY (EGD) WITH PROPOFOL;  Surgeon: Dolores Frame, MD;  [facility];  Service: Gastroenterology;  Laterality: N/A;   ESOPHAGOGASTRODUODENOSCOPY (EGD) WITH PROPOFOL N/A 06/15/2022   Procedure: ESOPHAGOGASTRODUODENOSCOPY (EGD) WITH PROPOFOL;  Surgeon: Corbin Ade, MD;  [facility];  Service: Endoscopy;  Laterality: N/A;  7:30am   JOINT REPLACEMENT     KNEE ARTHROPLASTY Left 06/27/2019   Procedure: COMPUTER ASSISTED TOTAL KNEE ARTHROPLASTY;  Surgeon: Samson Frederic, MD;  Location: [facility];  Service: Orthopedics;  Laterality: Left;   KNEE ARTHROPLASTY Right 08/15/2019   Procedure: COMPUTER ASSISTED TOTAL KNEE ARTHROPLASTY;  Surgeon: Samson Frederic, MD;  Location: [facility];  Service: Orthopedics;  Laterality: Right;   LEG SURGERY Right early 60's    Family History  Problem Relation Age of Onset   Colon cancer Mother        She died from her malignancy, 43   Lung cancer Father        Died from lung cancer, age  79   Emphysema Brother        Died at 71 from lung disease   COPD Brother    Dementia Sister    Healthy Son    Healthy Daughter     Social History   Socioeconomic History   Marital status: Married    Spouse name: Not on file   Number of children: 2   Years of education: Not on file   Highest education level: Not on file  Occupational History   Occupation: Truck Hospital doctor    Comment: runs a Designer, television/film set as well, [employer] in [place]  Tobacco Use   Smoking status: Never   Smokeless tobacco: Never  Vaping Use   Vaping status: Never Used  Substance and Sexual Activity   Alcohol use: Yes  Comment: occasionally   Drug use: No   Sexual activity: Yes  Other Topics Concern   Not on file  Social History Narrative   Daughter is 85, Son is 65.  3 grandchildren. Married for 34 years   Social Determinants of Health   Financial Resource Strain: Low Risk  (07/18/2023)   Overall Financial Resource Strain (CARDIA)    Difficulty of Paying Living Expenses: Not hard at all  Food Insecurity: No Food Insecurity (07/18/2023)   Hunger Vital Sign    Worried About Running Out of Food in the Last Year: Never true    Ran Out of Food in the Last Year: Never true  Transportation Needs: No Transportation Needs (07/18/2023)   PRAPARE - Administrator, Civil Service (Medical): No    Lack of Transportation (Non-Medical): No  Physical Activity: Inactive (07/18/2023)   Exercise Vital Sign    Days of Exercise per Week: 1 day    Minutes of Exercise per Session: 0 min  Stress: No Stress Concern Present (07/18/2023)   [employer] of Occupational Health - Occupational Stress Questionnaire    Feeling of Stress : Not at all  Social Connections: Unknown (07/18/2023)   Social Connection and Isolation Panel [NHANES]    Frequency of Communication with Friends and Family: More than three times a week    Frequency of Social Gatherings with Friends and Family: Once a week    Attends Religious  Services: Patient declined    Database administrator or Organizations: No    Attends Engineer, structural: Not on file    Marital Status: Married  Catering manager Violence: Not on file    Review of Systems  Constitutional:  Negative for chills and fever.  HENT:  Negative for ear discharge and sore throat.   Eyes:  Negative for blurred vision and pain.  Respiratory:  Negative for cough and shortness of breath.   Cardiovascular:  Negative for chest pain and leg swelling.  Gastrointestinal:  Negative for blood in stool, melena, nausea and vomiting.  Genitourinary:  Negative for frequency, hematuria and urgency.  Musculoskeletal:  Negative for falls and neck pain.  Skin:  Negative for itching and rash.  Neurological:  Negative for dizziness, weakness and headaches.  Psychiatric/Behavioral:  Negative for depression and suicidal ideas. The patient is not nervous/anxious.    Negative unless indicated in HPI   Objective    BP 135/79   Pulse 95   Temp 98.7 F (37.1 C) (Temporal)   Ht 5\' 9"  (1.753 m)   Wt 250 lb 9.6 oz (113.7 kg)   SpO2 94%   BMI 37.01 kg/m   Physical Exam Vitals and nursing note reviewed.  Constitutional:      Appearance: Normal appearance. He is obese.  HENT:     Head: Normocephalic and atraumatic.  Eyes:     General: No scleral icterus.    Extraocular Movements: Extraocular movements intact.     Conjunctiva/sclera: Conjunctivae normal.     Pupils: Pupils are equal, round, and reactive to light.  Cardiovascular:     Rate and Rhythm: Normal rate and regular rhythm.  Pulmonary:     Effort: Pulmonary effort is normal.     Breath sounds: Normal breath sounds.  Abdominal:     General: There is distension.     Palpations: Abdomen is soft. There is no mass.     Tenderness: There is no abdominal tenderness. There is no right CVA tenderness, left CVA tenderness,  guarding or rebound.  Musculoskeletal:        General: Normal range of motion.      Cervical back: Normal range of motion and neck supple.     Right lower leg: No edema.     Left lower leg: No edema.  Skin:    General: Skin is warm and dry.     Coloration: Skin is not jaundiced.     Findings: No rash.  Neurological:     Mental Status: He is alert and oriented to person, place, and time. Mental status is at baseline.  Psychiatric:        Attention and Perception: Attention and perception normal.        Mood and Affect: Mood and affect normal. Mood is not anxious or depressed.        Speech: Speech normal.        Behavior: Behavior normal. Behavior is cooperative.        Thought Content: Thought content normal. Thought content does not include homicidal or suicidal ideation. Thought content does not include homicidal or suicidal plan.        Cognition and Memory: Cognition and memory normal.        Judgment: Judgment normal.     Last CBC Lab Results  Component Value Date   WBC 11.9 (H) 05/19/2023   HGB 12.9 (L) 05/19/2023   HCT 39.2 05/19/2023   MCV 94.9 05/19/2023   MCH 31.2 05/19/2023   RDW 13.8 05/19/2023   PLT 310 05/19/2023   Last metabolic panel Lab Results  Component Value Date   GLUCOSE 206 (H) 05/19/2023   NA 135 05/19/2023   K 4.2 05/19/2023   CL 103 05/19/2023   CO2 23 05/19/2023   BUN 47 (H) 05/19/2023   CREATININE 2.29 (H) 05/19/2023   GFRNONAA 31 (L) 05/19/2023   CALCIUM 9.5 05/19/2023   PHOS 3.0 04/04/2023   PROT 7.3 11/11/2022   ALBUMIN 3.8 04/04/2023   LABGLOB 3.5 06/01/2020   AGRATIO 0.9 06/01/2020   BILITOT 0.5 11/11/2022   ALKPHOS 48 11/11/2022   AST 24 11/11/2022   ALT 20 11/11/2022   ANIONGAP 9 05/19/2023   Last hemoglobin A1c Lab Results  Component Value Date   HGBA1C 6.3 (H) 07/19/2023   Last thyroid functions Lab Results  Component Value Date   TSH 3.208 11/13/2014     Assessment & Plan:  Essential hypertension  Type 2 diabetes with nephropathy (HCC) -     Bayer DCA Hb A1c Waived  Screening PSA (prostate  specific antigen) -     PSA, total and free  Iron deficiency anemia due to chronic blood loss  Gastroesophageal reflux disease, unspecified whether esophagitis present  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, unspecified laterality  Encounter for general adult medical examination with abnormal findings -     Lipid panel -     Thyroid Panel With TSH  Need for vaccination -     Varicella-zoster vaccine IM -     Pneumococcal conjugate vaccine 20-valent  Encounter for immunization -     Flu Vaccine Trivalent High Dose (Fluad)  Bryan Atkinson  is 67 yrs old male no acute distress No medication refill needed today Continue follow with hematology and cardiology Vaccine: Zoster and pneumococcal administered today Labs: Lipid, PSA, Thyroid Need to have welcome to Medicare scheduled  Encourage healthy lifestyle choices, including diet (rich in fruits, vegetables, and lean proteins, and low in salt and simple carbohydrates) and exercise (at least  30 minutes of moderate physical activity daily).     The above assessment and management plan was discussed with the patient. The patient verbalized understanding of and has agreed to the management plan. Patient is aware to call the clinic if they develop any new symptoms or if symptoms persist or worsen. Patient is aware when to return to the clinic for a follow-up visit. Patient educated on when it is appropriate to go to the emergency department.  Return in about 3 months (around 10/19/2023) for chronic diseases management.   Arrie Aran Santa Lighter, DNP [facility] [address] [place], [place] [address] [phone]

## 2023-07-20 ENCOUNTER — Other Ambulatory Visit: Payer: Self-pay | Admitting: Nurse Practitioner

## 2023-07-20 ENCOUNTER — Other Ambulatory Visit: Payer: Self-pay

## 2023-07-20 ENCOUNTER — Telehealth: Payer: Self-pay

## 2023-07-20 DIAGNOSIS — R7989 Other specified abnormal findings of blood chemistry: Secondary | ICD-10-CM

## 2023-07-20 LAB — THYROID PANEL WITH TSH
Free Thyroxine Index: 2.4 (ref 1.2–4.9)
T3 Uptake Ratio: 31 % (ref 24–39)
T4, Total: 7.7 ug/dL (ref 4.5–12.0)
TSH: 4.65 u[IU]/mL — ABNORMAL HIGH (ref 0.450–4.500)

## 2023-07-20 LAB — LIPID PANEL
Chol/HDL Ratio: 4.5 {ratio} (ref 0.0–5.0)
Cholesterol, Total: 172 mg/dL (ref 100–199)
HDL: 38 mg/dL — ABNORMAL LOW (ref >39)
LDL Chol Calc (NIH): 84 mg/dL (ref 0–99)
Triglycerides: 301 mg/dL — ABNORMAL HIGH (ref 0–149)
VLDL Cholesterol Cal: 50 mg/dL — ABNORMAL HIGH (ref 5–40)

## 2023-07-20 LAB — PSA, TOTAL AND FREE
PSA, Free Pct: 61.4 %
PSA, Free: 0.86 ng/mL
Prostate Specific Ag, Serum: 1.4 ng/mL (ref 0.0–4.0)

## 2023-08-03 ENCOUNTER — Other Ambulatory Visit: Payer: Medicare HMO

## 2023-08-03 ENCOUNTER — Encounter: Payer: Self-pay | Admitting: Internal Medicine

## 2023-08-03 DIAGNOSIS — R7989 Other specified abnormal findings of blood chemistry: Secondary | ICD-10-CM

## 2023-08-04 LAB — THYROID PANEL WITH TSH
Free Thyroxine Index: 2.9 (ref 1.2–4.9)
T3 Uptake Ratio: 33 % (ref 24–39)
T4, Total: 8.9 ug/dL (ref 4.5–12.0)
TSH: 5.19 u[IU]/mL — ABNORMAL HIGH (ref 0.450–4.500)

## 2023-08-07 ENCOUNTER — Other Ambulatory Visit: Payer: Self-pay | Admitting: Nurse Practitioner

## 2023-08-07 ENCOUNTER — Telehealth: Payer: Self-pay

## 2023-08-07 ENCOUNTER — Other Ambulatory Visit: Payer: Self-pay

## 2023-08-07 DIAGNOSIS — E039 Hypothyroidism, unspecified: Secondary | ICD-10-CM

## 2023-08-07 MED ORDER — LEVOTHYROXINE SODIUM 75 MCG PO TABS: 75.0000 ug | ORAL_TABLET | Freq: Every day | ORAL | 1 refills | Status: DC

## 2023-08-07 MED ORDER — NIACIN ER 1000 MG PO TBCR: 1000.0000 mg | EXTENDED_RELEASE_TABLET | Freq: Every evening | ORAL | 0 refills | Status: DC

## 2023-08-07 NOTE — Telephone Encounter (Signed)
Pt aware prescription for niacin was sent to pharmacy.   Pt stated insurance does not want to cover medication, pt supposed to call insurance to figure out why they do not want to cover it and call us back

## 2023-08-22 ENCOUNTER — Encounter: Payer: Self-pay | Admitting: Internal Medicine

## 2023-08-22 ENCOUNTER — Ambulatory Visit: Payer: Medicare HMO | Admitting: Internal Medicine

## 2023-08-22 VITALS — BP 149/100 | HR 82 | Temp 98.2°F | Ht 69.0 in | Wt 252.0 lb

## 2023-08-22 DIAGNOSIS — Z862 Personal history of diseases of the blood and blood-forming organs and certain disorders involving the immune mechanism: Secondary | ICD-10-CM

## 2023-08-22 DIAGNOSIS — Z800 Family history of malignant neoplasm of digestive organs: Secondary | ICD-10-CM

## 2023-08-22 DIAGNOSIS — K5900 Constipation, unspecified: Secondary | ICD-10-CM

## 2023-08-22 DIAGNOSIS — Z8711 Personal history of peptic ulcer disease: Secondary | ICD-10-CM | POA: Diagnosis not present

## 2023-08-22 DIAGNOSIS — D500 Iron deficiency anemia secondary to blood loss (chronic): Secondary | ICD-10-CM

## 2023-08-22 DIAGNOSIS — K219 Gastro-esophageal reflux disease without esophagitis: Secondary | ICD-10-CM | POA: Diagnosis not present

## 2023-08-22 NOTE — Progress Notes (Signed)
Primary Care Physician:  Martina Sinner, NP Primary Gastroenterologist:  Dr. Jena Gauss  Pre-Procedure History & Physical: HPI:  Bryan Atkinson is a 67 y.o. male here for follow-up GERD peptic ulcer disease occasional constipation.  History of IDA.  Ferritin is more recently have been normal.  Previous noted gastric ulcer secondary to NSAID is completely healed no H. pylori on biopsies.  Reflux well-controlled on Protonix-he takes it after breakfast.  Positive family history of colon cancer in his mother who succumbed to the disease at age 37 negative colonoscopy (diverticulosis) 2019; due for high risk screening now.  Has occasional constipation for which she takes prune juice.  Would like other options.  Past Medical History:  Diagnosis Date   Anemia    Arthritis    Diabetes mellitus without complication (HCC)    GERD (gastroesophageal reflux disease)    Gout    History of kidney stones    Hyperlipidemia    Hypertension     Past Surgical History:  Procedure Laterality Date   BIOPSY  05/25/2020   Procedure: BIOPSY;  Surgeon: Corbin Ade, MD;  [facility]: [facility];  Service: Endoscopy;;   BIOPSY  03/25/2022   Procedure: BIOPSY;  Surgeon: Dolores Frame, MD;  Location: [facility];  Service: Gastroenterology;;   BIOPSY  06/15/2022   Procedure: BIOPSY;  Surgeon: Corbin Ade, MD;  [facility]: [facility];  Service: Endoscopy;;   CATARACT EXTRACTION W/PHACO Right 05/13/2019   Procedure: CATARACT EXTRACTION PHACO AND INTRAOCULAR LENS PLACEMENT (IOC);  Surgeon: Fabio Pierce, MD;  Location: [facility];  Service: Ophthalmology;  Laterality: Right;  CDE: 10.31   CATARACT EXTRACTION W/PHACO Left 05/27/2019   Procedure: CATARACT EXTRACTION PHACO AND INTRAOCULAR LENS PLACEMENT (IOC);  Surgeon: Fabio Pierce, MD;  Location: [facility];  Service: Ophthalmology;  Laterality: Left;  CDE: 6.49   COLONOSCOPY     times two 2002 ,2007   COLONOSCOPY N/A 10/07/2013    Procedure: COLONOSCOPY;  Surgeon: Corbin Ade, MD;  [facility]: [facility];  Service: Endoscopy;  Laterality: N/A;  8:30 AM   COLONOSCOPY N/A 10/05/2018   Dr. Jena Gauss: Diverticulosis.  Next colonoscopy 5 years due to first-degree relative with colon cancer   ESOPHAGOGASTRODUODENOSCOPY (EGD) WITH PROPOFOL N/A 05/25/2020   Raeleen Winstanley: 2 gastric ulcers.  No H. pylori.  Likely NSAID related.  Repeat EGD in 3 months   ESOPHAGOGASTRODUODENOSCOPY (EGD) WITH PROPOFOL N/A 09/17/2020   Procedure: ESOPHAGOGASTRODUODENOSCOPY (EGD) WITH PROPOFOL;  Surgeon: Corbin Ade, MD;  [facility];  Service: Endoscopy;  Laterality: N/A;  10:00am   ESOPHAGOGASTRODUODENOSCOPY (EGD) WITH PROPOFOL N/A 03/25/2022   Procedure: ESOPHAGOGASTRODUODENOSCOPY (EGD) WITH PROPOFOL;  Surgeon: Dolores Frame, MD;  [facility];  Service: Gastroenterology;  Laterality: N/A;   ESOPHAGOGASTRODUODENOSCOPY (EGD) WITH PROPOFOL N/A 06/15/2022   Procedure: ESOPHAGOGASTRODUODENOSCOPY (EGD) WITH PROPOFOL;  Surgeon: Corbin Ade, MD;  [facility];  Service: Endoscopy;  Laterality: N/A;  7:30am   JOINT REPLACEMENT     KNEE ARTHROPLASTY Left 06/27/2019   Procedure: COMPUTER ASSISTED TOTAL KNEE ARTHROPLASTY;  Surgeon: Samson Frederic, MD;  Location: [facility];  Service: Orthopedics;  Laterality: Left;   KNEE ARTHROPLASTY Right 08/15/2019   Procedure: COMPUTER ASSISTED TOTAL KNEE ARTHROPLASTY;  Surgeon: Samson Frederic, MD;  Location: [facility];  Service: Orthopedics;  Laterality: Right;   LEG SURGERY Right early 80's    Prior to Admission medications   Medication Sig Start Date End Date Taking? Authorizing Provider  acetaminophen (TYLENOL) 500 MG tablet Take 500 mg by mouth every 6 (six) hours as needed for mild pain.    [provider]  allopurinol (ZYLOPRIM) 300 MG tablet Take 300 mg by mouth daily.    [provider]  amLODipine (NORVASC) 5 MG tablet Take 0.5 tablets (2.5 mg total) by mouth  daily. 03/28/22   Vassie Loll, MD  Apple Cider Vinegar 500 MG TABS Take 500 mg by mouth daily.     [provider]  aspirin EC 81 MG tablet Take 81 mg by mouth daily. Swallow whole.    [provider]  carvedilol (COREG) 25 MG tablet Take 25 mg by mouth 2 (two) times daily. 12/03/20   [provider]  Co-Enzyme Q10 100 MG CAPS Take 100 mg by mouth daily.     [provider]  Cranberry 500 MG TABS Take 500 mg by mouth daily.    [provider]  FARXIGA 5 MG TABS tablet Take 5 mg by mouth every morning. 02/17/22   [provider]  fenofibrate (TRICOR) 145 MG tablet Take 145 mg by mouth daily.    [provider]  ferrous sulfate 325 (65 FE) MG tablet Take 325 mg by mouth daily with breakfast.    [provider]  Flaxseed, Linseed, (FLAXSEED OIL) 1000 MG CAPS Take 1,000 mg by mouth daily.    [provider]  furosemide (LASIX) 20 MG tablet Take 1 tablet (20 mg total) by mouth 2 (two) times daily. 03/27/22   Vassie Loll, MD  glipiZIDE (GLUCOTROL XL) 10 MG 24 hr tablet Take 10 mg by mouth daily with breakfast. 06/17/20   [provider]  glucose blood test strip 1 each by Other route as needed for other. Use as instructed    [provider]  hydrALAZINE (APRESOLINE) 100 MG tablet Take 50 mg by mouth 2 (two) times daily. 05/19/22   [provider]  Boris Lown Oil 500 MG CAPS Take 500 mg by mouth 2 (two) times a day.    [provider]  levothyroxine (SYNTHROID) 75 MCG tablet Take 1 tablet (75 mcg total) by mouth daily before breakfast. 08/07/23 08/06/24  St Vena Austria, NP  losartan (COZAAR) 25 MG tablet Take 12.5 mg by mouth daily. 05/16/22   [provider]  Multiple Vitamin (MULTIVITAMIN) tablet Take 1 tablet by mouth daily.    [provider]  Niacin CR 1000 MG TBCR TAKE 1 TABLET (1,000 MG TOTAL) BY MOUTH EVERY EVENING. 08/07/23   St Santa Lighter, Dois Davenport, NP   pantoprazole (PROTONIX) 40 MG tablet Take 40 mg by mouth 2 (two) times daily. 06/21/22   [provider]  pravastatin (PRAVACHOL) 40 MG tablet Take 40 mg by mouth at bedtime.     [provider]  traZODone (DESYREL) 50 MG tablet Take 50 mg by mouth at bedtime.     [provider]  Turmeric 500 MG CAPS Take 500 mg by mouth daily.    [provider]    Allergies as of 08/22/2023   (No Known Allergies)    Family History  Problem Relation Age of Onset   Colon cancer Mother        She died from her malignancy, 28   Lung cancer Father        Died from lung cancer, age 89   Emphysema Brother        Died at 26 from lung disease   COPD Brother  Dementia Sister    Healthy Son    Healthy Daughter     Social History   Socioeconomic History   Marital status: Married    Spouse name: Not on file   Number of children: 2   Years of education: Not on file   Highest education level: Not on file  Occupational History   Occupation: Truck [employer] doctor    Comment: runs a Designer, television/film set as well, [employer] in [place]  Tobacco Use   Smoking status: Never   Smokeless tobacco: Never  Vaping Use   Vaping status: Never Used  Substance and Sexual Activity   Alcohol use: Yes    Comment: occasionally   Drug use: No   Sexual activity: Yes  Other Topics Concern   Not on file  Social History Narrative   Daughter is 11, Son is 11.  3 grandchildren. Married for 34 years   Social Determinants of Health   Financial Resource Strain: Low Risk  (07/18/2023)   Overall Financial Resource Strain (CARDIA)    Difficulty of Paying Living Expenses: Not hard at all  Food Insecurity: No Food Insecurity (07/18/2023)   Hunger Vital Sign    Worried About Running Out of Food in the Last Year: Never true    Ran Out of Food in the Last Year: Never true  Transportation Needs: No Transportation Needs (07/18/2023)   PRAPARE - Administrator, Civil Service (Medical): No    Lack  of Transportation (Non-Medical): No  Physical Activity: Inactive (07/18/2023)   Exercise Vital Sign    Days of Exercise per Week: 1 day    Minutes of Exercise per Session: 0 min  Stress: No Stress Concern Present (07/18/2023)   [employer] of Occupational Health - Occupational Stress Questionnaire    Feeling of Stress : Not at all  Social Connections: Unknown (07/18/2023)   Social Connection and Isolation Panel [NHANES]    Frequency of Communication with Friends and Family: More than three times a week    Frequency of Social Gatherings with Friends and Family: Once a week    Attends Religious Services: Patient declined    Database administrator or Organizations: No    Attends Engineer, structural: Not on file    Marital Status: Married  Catering manager Violence: Not on file    Review of Systems: See HPI, otherwise negative ROS  Physical Exam: There were no vitals taken for this visit. General:   Alert,  Well-developed, well-nourished, pleasant and cooperative in NAD Lungs:  Clear throughout to auscultation.   No wheezes, crackles, or rhonchi. No acute distress. Heart:  Regular rate and rhythm; no murmurs, clicks, rubs,  or gallops. Abdomen: Obese.  Soft and nontender without appreciable mass organomegaly   Impression/Plan: Pleasant 67 year old gentleman with a positive family history of colon cancer in first-degree relative.  He is due for high rescreening colonoscopy at this time.  History of GERD well-controlled on Protonix and needs to take it before meal  History of NSAID induced peptic ulcer disease with healing documented on prior EGD he is doing a good job now avoiding all forms of NSAIDs.  History of iron deficiency anemia most recent ferritin within normal limits.  Constipation without any alarm features.  Recommendations:   GERD and constipation information provided  Continue Protonix 40 mg daily best taken 30 minutes before lunch.  May use MiraLAX  17 g (1 capful) in 8 ounces of water as needed to facilitate bowel function  Best to avoid all forms of NSAIDs like Advil Aleve and aspirin containing headache powder as we history of chronic kidney disease and stomach ulcers.  Plan for a repeat colonoscopy for surveillance (positive family history colon.  The risks, benefits, limitations, alternatives and imponderables have been reviewed with the patient. Questions have been answered. All parties are agreeable.           Notice: This dictation was prepared with Dragon dictation along with smaller phrase technology. Any transcriptional errors that result from this process are unintentional and may not be corrected upon review.

## 2023-08-22 NOTE — Patient Instructions (Signed)
It was nice to see you again today?  GERD and constipation information provided  Continue Protonix 40 mg daily best taken 30 minutes before lunch.  May use MiraLAX 17 g (1 capful) in 8 ounces of water as needed to facilitate bowel function  Best to avoid all forms of NSAIDs like Advil Aleve and aspirin containing headache powder as we history of chronic kidney disease and stomach ulcers.  Plan for a repeat colonoscopy for surveillance (positive family history colon cancer) ASA 3  Further recommendations to follow.

## 2023-08-23 ENCOUNTER — Encounter: Payer: Self-pay | Admitting: *Deleted

## 2023-08-23 MED ORDER — PEG 3350-KCL-NA BICARB-NACL 420 G PO SOLR: 4000.0000 mL | Freq: Once | ORAL | 0 refills | Status: AC

## 2023-08-24 ENCOUNTER — Other Ambulatory Visit: Payer: Self-pay

## 2023-08-24 MED ORDER — PANTOPRAZOLE SODIUM 40 MG PO TBEC: 40.0000 mg | DELAYED_RELEASE_TABLET | Freq: Two times a day (BID) | ORAL | 11 refills | Status: DC

## 2023-08-28 ENCOUNTER — Other Ambulatory Visit: Payer: Self-pay | Admitting: Nurse Practitioner

## 2023-08-28 ENCOUNTER — Telehealth: Payer: Self-pay

## 2023-08-28 ENCOUNTER — Other Ambulatory Visit: Payer: Medicare HMO

## 2023-08-28 DIAGNOSIS — E039 Hypothyroidism, unspecified: Secondary | ICD-10-CM

## 2023-08-28 MED ORDER — GLIPIZIDE ER 10 MG PO TB24: 10.0000 mg | ORAL_TABLET | Freq: Every day | ORAL | 0 refills | Status: DC

## 2023-08-28 MED ORDER — ALLOPURINOL 300 MG PO TABS: 300.0000 mg | ORAL_TABLET | Freq: Every day | ORAL | 0 refills | Status: DC

## 2023-08-28 NOTE — Telephone Encounter (Signed)
Pt aware and verbalized understanding.  

## 2023-08-28 NOTE — Telephone Encounter (Signed)
Pt came in to get blood work and came to nurses station asking for refill to be sent in for allopurinol and glipizide. He is requesting for the glipizide be sent in to take twice a day

## 2023-08-29 ENCOUNTER — Other Ambulatory Visit: Payer: Self-pay | Admitting: Nurse Practitioner

## 2023-08-29 DIAGNOSIS — E039 Hypothyroidism, unspecified: Secondary | ICD-10-CM

## 2023-08-29 LAB — TSH: TSH: 2.33 u[IU]/mL (ref 0.450–4.500)

## 2023-09-21 NOTE — Patient Instructions (Signed)
Bryan Atkinson  09/21/2023     @PREFPERIOPPHARMACY @   Your procedure is scheduled on  09/27/2023.   Report to [facility] at  1030  A.M.   Call this number if you have problems the morning of surgery:  [phone]  If you experience any cold or flu symptoms such as cough, fever, chills, shortness of breath, etc. between now and your scheduled surgery, please notify us at the above number.   Remember:        Your last dose of iron should have been on 09/19/2023.         Your last dose of farxiga should be on 09/23/2023.         DO NOT take any medications for diabetes the morning of your procedure.    Follow the diet and prep instructions given to you by the office.   You may drink clear liquids until 0830 am on 09/27/2023.     Clear liquids allowed are:                    Water, Juice (No red color; non-citric and without pulp; diabetics please choose diet or no sugar options), Carbonated beverages (diabetics please choose diet or no sugar options), Clear Tea (No creamer, milk, or cream, including half & half and powdered creamer), Black Coffee Only (No creamer, milk or cream, including half & half and powdered creamer), and Clear Sports drink (No red color; diabetics please choose diet or no sugar options)    Take these medicines the morning of surgery with A SIP OF WATER                  allopurinol, amlodipine, carvedilol, levothyroxine, pantoprazole.     Do not wear jewelry, make-up or nail polish, including gel polish,  artificial nails, or any other type of covering on natural nails (fingers and  toes).  Do not wear lotions, powders, or perfumes, or deodorant.  Do not shave 48 hours prior to surgery.  Men may shave face and neck.  Do not bring valuables to the hospital.  [facility] is not responsible for any belongings or valuables.  Contacts, dentures or bridgework may not be worn into surgery.  Leave your suitcase in the car.  After surgery it may  be brought to your room.  For patients admitted to the hospital, discharge time will be determined by your treatment team.  Patients discharged the day of surgery will not be allowed to drive home and must have someone with them for 24 hours.    Special instructions:   DO NOT smoke tobacco or vape for 24 hours before your procedure.  Please read over the following fact sheets that you were given. Anesthesia Post-op Instructions and Care and Recovery After Surgery      Colonoscopy, Adult, Care After The following information offers guidance on how to care for yourself after your procedure. Your health care provider may also give you more specific instructions. If you have problems or questions, contact your health care provider. What can I expect after the procedure? After the procedure, it is common to have: A small amount of blood in your stool for 24 hours after the procedure. Some gas. Mild cramping or bloating of your abdomen. Follow these instructions at home: Eating and drinking  Drink enough fluid to keep your urine pale yellow. Follow instructions from your health care provider about eating or drinking restrictions. Resume your  normal diet as told by your health care provider. Avoid heavy or fried foods that are hard to digest. Activity Rest as told by your health care provider. Avoid sitting for a long time without moving. Get up to take short walks every 1-2 hours. This is important to improve blood flow and breathing. Ask for help if you feel weak or unsteady. Return to your normal activities as told by your health care provider. Ask your health care provider what activities are safe for you. Managing cramping and bloating  Try walking around when you have cramps or feel bloated. If directed, apply heat to your abdomen as told by your health care provider. Use the heat source that your health care provider recommends, such as a moist heat pack or a heating pad. Place a  towel between your skin and the heat source. Leave the heat on for 20-30 minutes. Remove the heat if your skin turns bright red. This is especially important if you are unable to feel pain, heat, or cold. You have a greater risk of getting burned. General instructions If you were given a sedative during the procedure, it can affect you for several hours. Do not drive or operate machinery until your health care provider says that it is safe. For the first 24 hours after the procedure: Do not sign important documents. Do not drink alcohol. Do your regular daily activities at a slower pace than normal. Eat soft foods that are easy to digest. Take over-the-counter and prescription medicines only as told by your health care provider. Keep all follow-up visits. This is important. Contact a health care provider if: You have blood in your stool 2-3 days after the procedure. Get help right away if: You have more than a small spotting of blood in your stool. You have large blood clots in your stool. You have swelling of your abdomen. You have nausea or vomiting. You have a fever. You have increasing pain in your abdomen that is not relieved with medicine. These symptoms may be an emergency. Get help right away. Call 911. Do not wait to see if the symptoms will go away. Do not drive yourself to the hospital. Summary After the procedure, it is common to have a small amount of blood in your stool. You may also have mild cramping and bloating of your abdomen. If you were given a sedative during the procedure, it can affect you for several hours. Do not drive or operate machinery until your health care provider says that it is safe. Get help right away if you have a lot of blood in your stool, nausea or vomiting, a fever, or increased pain in your abdomen. This information is not intended to replace advice given to you by your health care provider. Make sure you discuss any questions you have with your  health care provider. Document Revised: 11/15/2022 Document Reviewed: 05/26/2021 Elsevier Patient Education  2024 Else[employer] Inc[employer] Monitored Anesthesia Care, Care After The following information offers guidance on how to care for yourself after your procedure. Your health care provider may also give you more specific instructions. If you have problems or questions, contact your health care provider. What can I expect after the procedure? After the procedure, it is common to have: Tiredness. Little or no memory about what happened during or after the procedure. Impaired judgment when it comes to making decisions. Nausea or vomiting. Some trouble with balance. Follow these instructions at home: For the time period you were told by your  health care provider:  Rest. Do not participate in activities where you could fall or become injured. Do not drive or use machinery. Do not drink alcohol. Do not take sleeping pills or medicines that cause drowsiness. Do not make important decisions or sign legal documents. Do not take care of children on your own. Medicines Take over-the-counter and prescription medicines only as told by your health care provider. If you were prescribed antibiotics, take them as told by your health care provider. Do not stop using the antibiotic even if you start to feel better. Eating and drinking Follow instructions from your health care provider about what you may eat and drink. Drink enough fluid to keep your urine pale yellow. If you vomit: Drink clear fluids slowly and in small amounts as you are able. Clear fluids include water, ice chips, low-calorie sports drinks, and fruit juice that has water added to it (diluted fruit juice). Eat light and bland foods in small amounts as you are able. These foods include bananas, applesauce, rice, lean meats, toast, and crackers. General instructions  Have a responsible adult stay with you for the time you are told. It is  important to have someone help care for you until you are awake and alert. If you have sleep apnea, surgery and some medicines can increase your risk for breathing problems. Follow instructions from your health care provider about wearing your sleep device: When you are sleeping. This includes during daytime naps. While taking prescription pain medicines, sleeping medicines, or medicines that make you drowsy. Do not use any products that contain nicotine or tobacco. These products include cigarettes, chewing tobacco, and vaping devices, such as e-cigarettes. If you need help quitting, ask your health care provider. Contact a health care provider if: You feel nauseous or vomit every time you eat or drink. You feel light-headed. You are still sleepy or having trouble with balance after 24 hours. You get a rash. You have a fever. You have redness or swelling around the IV site. Get help right away if: You have trouble breathing. You have new confusion after you get home. These symptoms may be an emergency. Get help right away. Call 911. Do not wait to see if the symptoms will go away. Do not drive yourself to the hospital. This information is not intended to replace advice given to you by your health care provider. Make sure you discuss any questions you have with your health care provider. Document Revised: 02/28/2022 Document Reviewed: 02/28/2022 Elsevier Patient Education  2024 [employer].

## 2023-09-22 ENCOUNTER — Encounter (HOSPITAL_COMMUNITY): Payer: Self-pay

## 2023-09-22 ENCOUNTER — Encounter (HOSPITAL_COMMUNITY)
Admission: RE | Admit: 2023-09-22 | Discharge: 2023-09-22 | Disposition: A | Discharge status: Home / Self Care | Payer: Medicare HMO | Source: Ambulatory Visit | Attending: Internal Medicine | Admitting: Internal Medicine

## 2023-09-22 VITALS — BP 138/86 | HR 86 | Temp 97.7°F | Resp 18 | Ht 69.0 in | Wt 252.0 lb

## 2023-09-22 DIAGNOSIS — E669 Obesity, unspecified: Secondary | ICD-10-CM | POA: Insufficient documentation

## 2023-09-22 DIAGNOSIS — Z01810 Encounter for preprocedural cardiovascular examination: Secondary | ICD-10-CM | POA: Diagnosis present

## 2023-09-22 DIAGNOSIS — E1121 Type 2 diabetes mellitus with diabetic nephropathy: Secondary | ICD-10-CM | POA: Diagnosis not present

## 2023-09-22 DIAGNOSIS — D500 Iron deficiency anemia secondary to blood loss (chronic): Secondary | ICD-10-CM | POA: Diagnosis not present

## 2023-09-22 DIAGNOSIS — Z6837 Body mass index (BMI) 37.0-37.9, adult: Secondary | ICD-10-CM | POA: Diagnosis not present

## 2023-09-22 DIAGNOSIS — Z01818 Encounter for other preprocedural examination: Secondary | ICD-10-CM | POA: Insufficient documentation

## 2023-09-22 DIAGNOSIS — R9431 Abnormal electrocardiogram [ECG] [EKG]: Secondary | ICD-10-CM | POA: Insufficient documentation

## 2023-09-22 DIAGNOSIS — Z01812 Encounter for preprocedural laboratory examination: Secondary | ICD-10-CM | POA: Diagnosis present

## 2023-09-22 DIAGNOSIS — I10 Essential (primary) hypertension: Secondary | ICD-10-CM | POA: Insufficient documentation

## 2023-09-22 LAB — CBC WITH DIFFERENTIAL/PLATELET
Abs Immature Granulocytes: 0.07 10*3/uL (ref 0.00–0.07)
Basophils Absolute: 0.1 10*3/uL (ref 0.0–0.1)
Basophils Relative: 1 %
Eosinophils Absolute: 0.3 10*3/uL (ref 0.0–0.5)
Eosinophils Relative: 2 %
HCT: 38.6 % — ABNORMAL LOW (ref 39.0–52.0)
Hemoglobin: 12.5 g/dL — ABNORMAL LOW (ref 13.0–17.0)
Immature Granulocytes: 1 %
Lymphocytes Relative: 19 %
Lymphs Abs: 2 10*3/uL (ref 0.7–4.0)
MCH: 30.7 pg (ref 26.0–34.0)
MCHC: 32.4 g/dL (ref 30.0–36.0)
MCV: 94.8 fL (ref 80.0–100.0)
Monocytes Absolute: 1.2 10*3/uL — ABNORMAL HIGH (ref 0.1–1.0)
Monocytes Relative: 11 %
Neutro Abs: 7.2 10*3/uL (ref 1.7–7.7)
Neutrophils Relative %: 66 %
Platelets: 334 10*3/uL (ref 150–400)
RBC: 4.07 MIL/uL — ABNORMAL LOW (ref 4.22–5.81)
RDW: 13.8 % (ref 11.5–15.5)
WBC: 10.8 10*3/uL — ABNORMAL HIGH (ref 4.0–10.5)
nRBC: 0 % (ref 0.0–0.2)

## 2023-09-22 LAB — BASIC METABOLIC PANEL
Anion gap: 13 (ref 5–15)
BUN: 41 mg/dL — ABNORMAL HIGH (ref 8–23)
CO2: 21 mmol/L — ABNORMAL LOW (ref 22–32)
Calcium: 10 mg/dL (ref 8.9–10.3)
Chloride: 100 mmol/L (ref 98–111)
Creatinine, Ser: 2.27 mg/dL — ABNORMAL HIGH (ref 0.61–1.24)
GFR, Estimated: 31 mL/min — ABNORMAL LOW (ref >60)
Glucose, Bld: 138 mg/dL — ABNORMAL HIGH (ref 70–99)
Potassium: 3.9 mmol/L (ref 3.5–5.1)
Sodium: 134 mmol/L — ABNORMAL LOW (ref 135–145)

## 2023-09-27 ENCOUNTER — Ambulatory Visit (HOSPITAL_COMMUNITY)
Admission: RE | Admit: 2023-09-27 | Discharge: 2023-09-27 | Disposition: A | Discharge status: Home / Self Care | Payer: Medicare HMO | Source: Ambulatory Visit | Attending: Internal Medicine | Admitting: Internal Medicine

## 2023-09-27 ENCOUNTER — Ambulatory Visit (HOSPITAL_COMMUNITY): Payer: Medicare HMO | Admitting: Certified Registered Nurse Anesthetist

## 2023-09-27 ENCOUNTER — Encounter (HOSPITAL_COMMUNITY)
Admission: RE | Disposition: A | Discharge status: Home / Self Care | Payer: Self-pay | Source: Ambulatory Visit | Attending: Internal Medicine

## 2023-09-27 ENCOUNTER — Encounter (HOSPITAL_COMMUNITY): Payer: Self-pay | Admitting: Internal Medicine

## 2023-09-27 DIAGNOSIS — D123 Benign neoplasm of transverse colon: Secondary | ICD-10-CM

## 2023-09-27 DIAGNOSIS — Z7984 Long term (current) use of oral hypoglycemic drugs: Secondary | ICD-10-CM | POA: Diagnosis not present

## 2023-09-27 DIAGNOSIS — I10 Essential (primary) hypertension: Secondary | ICD-10-CM | POA: Insufficient documentation

## 2023-09-27 DIAGNOSIS — Z1211 Encounter for screening for malignant neoplasm of colon: Secondary | ICD-10-CM

## 2023-09-27 DIAGNOSIS — E119 Type 2 diabetes mellitus without complications: Secondary | ICD-10-CM | POA: Insufficient documentation

## 2023-09-27 DIAGNOSIS — Z800 Family history of malignant neoplasm of digestive organs: Secondary | ICD-10-CM | POA: Diagnosis not present

## 2023-09-27 HISTORY — PX: POLYPECTOMY: SHX5525

## 2023-09-27 HISTORY — PX: COLONOSCOPY WITH PROPOFOL: SHX5780

## 2023-09-27 LAB — GLUCOSE, CAPILLARY: Glucose-Capillary: 135 mg/dL — ABNORMAL HIGH (ref 70–99)

## 2023-09-27 SURGERY — COLONOSCOPY WITH PROPOFOL
Anesthesia: General

## 2023-09-27 MED ORDER — PROPOFOL 500 MG/50ML IV EMUL
INTRAVENOUS | Status: DC | PRN
  Administered 2023-09-27: 200 ug/kg/min via INTRAVENOUS

## 2023-09-27 MED ORDER — LACTATED RINGERS IV SOLN: INTRAVENOUS | Status: DC

## 2023-09-27 MED ORDER — PROPOFOL 10 MG/ML IV BOLUS
INTRAVENOUS | Status: DC | PRN
  Administered 2023-09-27: 100 mg via INTRAVENOUS

## 2023-09-27 MED ORDER — PROPOFOL 500 MG/50ML IV EMUL
INTRAVENOUS | Status: AC
  Filled 2023-09-27: qty 50

## 2023-09-27 MED ORDER — PHENYLEPHRINE HCL (PRESSORS) 10 MG/ML IV SOLN
INTRAVENOUS | Status: DC | PRN
  Administered 2023-09-27 (×2): 80 ug via INTRAVENOUS

## 2023-09-27 NOTE — Anesthesia Preprocedure Evaluation (Signed)
Anesthesia Evaluation  Patient identified by MRN, date of birth, ID band Patient awake    Reviewed: Allergy & Precautions, NPO status , Patient's Chart, lab work & pertinent test results, reviewed documented beta blocker date and time   Airway Mallampati: II       Dental   Pulmonary           Cardiovascular hypertension,      Neuro/Psych    GI/Hepatic   Endo/Other  diabetes    Renal/GU      Musculoskeletal   Abdominal   Peds  Hematology   Anesthesia Other Findings   Reproductive/Obstetrics                             Anesthesia Physical Anesthesia Plan  ASA: 3  Anesthesia Plan: General   Post-op Pain Management:    Induction:   PONV Risk Score and Plan:   Airway Management Planned:   Additional Equipment:   Intra-op Plan:   Post-operative Plan:   Informed Consent: I have reviewed the patients History and Physical, chart, labs and discussed the procedure including the risks, benefits and alternatives for the proposed anesthesia with the patient or authorized representative who has indicated his/her understanding and acceptance.     Dental Advisory Given  Plan Discussed with: Anesthesiologist and CRNA  Anesthesia Plan Comments:        Anesthesia Quick Evaluation

## 2023-09-27 NOTE — Op Note (Signed)
[facility] Patient Name: Bryan Atkinson Procedure Date: 09/27/2023 1:16 PM MRN: [id number] Date of Birth: 05-04-1956 Attending MD: Gennette Pac , MD, [id number] CSN: [id number] Age: 67 Admit Type: Outpatient Procedure:                Colonoscopy Indications:              Screening in patient at increased risk: Family                            history of 1st-degree relative with colorectal                            cancer Providers:                Gennette Pac, MD, Francoise Ceo RN, RN,                            Lennice Sites Technician, Technician Referring MD:              Medicines:                Propofol per Anesthesia Complications:            No immediate complications. Estimated Blood Loss:     Estimated blood loss was minimal. Estimated blood                            loss was minimal. Procedure:                Pre-Anesthesia Assessment:                           - Prior to the procedure, a History and Physical                            was performed, and patient medications and                            allergies were reviewed. The patient's tolerance of                            previous anesthesia was also reviewed. The risks                            and benefits of the procedure and the sedation                            options and risks were discussed with the patient.                            All questions were answered, and informed consent                            was obtained. Prior Anticoagulants: The patient has                            taken  no anticoagulant or antiplatelet agents.                            After reviewing the risks and benefits, the patient                            was deemed in satisfactory condition to undergo the                            procedure.                           After obtaining informed consent, the colonoscope                            was passed under direct vision. Throughout the                             procedure, the patient's blood pressure, pulse, and                            oxygen saturations were monitored continuously. The                            [id number]) scope was introduced through                            the anus and advanced to the the cecum, identified                            by appendiceal orifice and ileocecal valve. The                            colonoscopy was performed without difficulty. The                            patient tolerated the procedure well. The quality                            of the bowel preparation was adequate. Scope In: 1:58:39 PM Scope Out: 2:11:54 PM Scope Withdrawal Time: 0 hours 9 minutes 8 seconds  Total Procedure Duration: 0 hours 13 minutes 15 seconds  Findings:      The perianal and digital rectal examinations were normal.      An 8 mm polyp was found in the mid transverse colon. The polyp was       sessile. The polyp was removed with a cold snare. Resection and       retrieval were complete. Estimated blood loss was minimal.      The exam was otherwise without abnormality on direct and retroflexion       views. Impression:               - One 8 mm polyp in the mid transverse colon,  removed with a cold snare. Resected and retrieved.                           - The examination was otherwise normal on direct                            and retroflexion views. Moderate Sedation:      Moderate (conscious) sedation was personally administered by an       anesthesia professional. The following parameters were monitored: oxygen       saturation, heart rate, blood pressure, respiratory rate, EKG, adequacy       of pulmonary ventilation, and response to care. Recommendation:           - Patient has a contact number available for                            emergencies. The signs and symptoms of potential                            delayed complications were discussed with the                             patient. Return to normal activities tomorrow.                            Written discharge instructions were provided to the                            patient.                           - Advance diet as tolerated.                           - Continue present medications.                           - Repeat colonoscopy date to be determined after                            pending pathology results are reviewed for                            surveillance.                           - Return to GI office (date not yet determined). Procedure Code(s):        --- Professional ---                           [phone], Colonoscopy, flexible; with removal of                            tumor(s), polyp(s), or other lesion(s) by snare  technique Diagnosis Code(s):        --- Professional ---                           Z80.0, Family history of malignant neoplasm of                            digestive organs                           D12.3, Benign neoplasm of transverse colon (hepatic                            flexure or splenic flexure) CPT copyright 2022 American Medical Association. All rights reserved. The codes documented in this report are preliminary and upon coder review may  be revised to meet current compliance requirements. Gerrit [facility]. Akshath Mccarey, MD Gennette Pac, MD 09/27/2023 2:24:19 PM This report has been signed electronically. Number of Addenda: 0

## 2023-09-27 NOTE — Transfer of Care (Signed)
Immediate Anesthesia Transfer of Care Note  Patient: Bryan Atkinson  Procedure(s) Performed: COLONOSCOPY WITH PROPOFOL POLYPECTOMY  Patient Location: Short Stay  Anesthesia Type:General  Level of Consciousness: awake, alert , and patient cooperative  Airway & Oxygen Therapy: Patient Spontanous Breathing  Post-op Assessment: Report given to RN, Post -op Vital signs reviewed and stable, and Patient moving all extremities X 4  Post vital signs: Reviewed and stable  Last Vitals:  Vitals Value Taken Time  BP 82/61 09/27/23 1417  Temp 36.6 C 09/27/23 1417  Pulse 95 09/27/23 1417  Resp 20 09/27/23 1417  SpO2 94 % 09/27/23 1417    Last Pain:  Vitals:   09/27/23 1417  TempSrc:   PainSc: 0-No pain         Complications: No notable events documented.

## 2023-09-27 NOTE — Discharge Instructions (Signed)
  Colonoscopy Discharge Instructions  Read the instructions outlined below and refer to this sheet in the next few weeks. These discharge instructions provide you with general information on caring for yourself after you leave the hospital. Your doctor may also give you specific instructions. While your treatment has been planned according to the most current medical practices available, unavoidable complications occasionally occur. If you have any problems or questions after discharge, call Dr. Jena Gauss at [phone]. ACTIVITY You may resume your regular activity, but move at a slower pace for the next 24 hours.  Take frequent rest periods for the next 24 hours.  Walking will help get rid of the air and reduce the bloated feeling in your belly (abdomen).  No driving for 24 hours (because of the medicine (anesthesia) used during the test).   Do not sign any important legal documents or operate any machinery for 24 hours (because of the anesthesia used during the test).  NUTRITION Drink plenty of fluids.  You may resume your normal diet as instructed by your doctor.  Begin with a light meal and progress to your normal diet. Heavy or fried foods are harder to digest and may make you feel sick to your stomach (nauseated).  Avoid alcoholic beverages for 24 hours or as instructed.  MEDICATIONS You may resume your normal medications unless your doctor tells you otherwise.  WHAT YOU CAN EXPECT TODAY Some feelings of bloating in the abdomen.  Passage of more gas than usual.  Spotting of blood in your stool or on the toilet paper.  IF YOU HAD POLYPS REMOVED DURING THE COLONOSCOPY: No aspirin products for 7 days or as instructed.  No alcohol for 7 days or as instructed.  Eat a soft diet for the next 24 hours.  FINDING OUT THE RESULTS OF YOUR TEST Not all test results are available during your visit. If your test results are not back during the visit, make an appointment with your caregiver to find out the  results. Do not assume everything is normal if you have not heard from your caregiver or the medical facility. It is important for you to follow up on all of your test results.  SEEK IMMEDIATE MEDICAL ATTENTION IF: You have more than a spotting of blood in your stool.  Your belly is swollen (abdominal distention).  You are nauseated or vomiting.  You have a temperature over 101.  You have abdominal pain or discomfort that is severe or gets worse throughout the day.    1 polyp found and removed from your colon  Further recommendations to follow pending review of pathology report  At patient request, I called Lupita Leash at [phone] to voicemail "voicemail full"

## 2023-09-27 NOTE — H&P (Signed)
@LOGO @   Primary Care Physician:  Bryan Sinner, NP Primary Gastroenterologist:  Dr. Jena Atkinson  Pre-Procedure History & Physical: HPI:  Bryan Atkinson is a 67 y.o. male here for high risk screening colonoscopy positive Family history colon cancer first-degree relative; negative colonoscopy 5 years ago.  Past Medical History:  Diagnosis Date   Anemia    Arthritis    Diabetes mellitus without complication (HCC)    GERD (gastroesophageal reflux disease)    Gout    History of kidney stones    Hyperlipidemia    Hypertension     Past Surgical History:  Procedure Laterality Date   BIOPSY  05/25/2020   Procedure: BIOPSY;  Surgeon: Bryan Ade, MD;  Location: [facility];  Service: Endoscopy;;   BIOPSY  03/25/2022   Procedure: BIOPSY;  Surgeon: Bryan Frame, MD;  Location: [facility];  Service: Gastroenterology;;   BIOPSY  06/15/2022   Procedure: BIOPSY;  Surgeon: Bryan Ade, MD;  Location: [facility];  Service: Endoscopy;;   CATARACT EXTRACTION W/PHACO Right 05/13/2019   Procedure: CATARACT EXTRACTION PHACO AND INTRAOCULAR LENS PLACEMENT (IOC);  Surgeon: Bryan Pierce, MD;  Location: [facility];  Service: Ophthalmology;  Laterality: Right;  CDE: 10.31   CATARACT EXTRACTION W/PHACO Left 05/27/2019   Procedure: CATARACT EXTRACTION PHACO AND INTRAOCULAR LENS PLACEMENT (IOC);  Surgeon: Bryan Pierce, MD;  Location: [facility];  Service: Ophthalmology;  Laterality: Left;  CDE: 6.49   COLONOSCOPY     times two 2002 ,2007   COLONOSCOPY N/A 10/07/2013   Procedure: COLONOSCOPY;  Surgeon: Bryan Ade, MD;  Location: [facility];  Service: Endoscopy;  Laterality: N/A;  8:30 AM   COLONOSCOPY N/A 10/05/2018   Dr. Jena Atkinson: Diverticulosis.  Next colonoscopy 5 years due to first-degree relative with colon cancer   ESOPHAGOGASTRODUODENOSCOPY (EGD) WITH PROPOFOL N/A 05/25/2020   Bryan Atkinson: 2 gastric ulcers.  No H. pylori.  Likely NSAID related.  Repeat EGD in 3 months    ESOPHAGOGASTRODUODENOSCOPY (EGD) WITH PROPOFOL N/A 09/17/2020   Procedure: ESOPHAGOGASTRODUODENOSCOPY (EGD) WITH PROPOFOL;  Surgeon: Bryan Ade, MD;  [facility]: [facility];  Service: Endoscopy;  Laterality: N/A;  10:00am   ESOPHAGOGASTRODUODENOSCOPY (EGD) WITH PROPOFOL N/A 03/25/2022   Procedure: ESOPHAGOGASTRODUODENOSCOPY (EGD) WITH PROPOFOL;  Surgeon: Bryan Frame, MD;  [facility]: [facility];  Service: Gastroenterology;  Laterality: N/A;   ESOPHAGOGASTRODUODENOSCOPY (EGD) WITH PROPOFOL N/A 06/15/2022   Procedure: ESOPHAGOGASTRODUODENOSCOPY (EGD) WITH PROPOFOL;  Surgeon: Bryan Ade, MD;  Location: [facility];  Service: Endoscopy;  Laterality: N/A;  7:30am   JOINT REPLACEMENT     KNEE ARTHROPLASTY Left 06/27/2019   Procedure: COMPUTER ASSISTED TOTAL KNEE ARTHROPLASTY;  Surgeon: Bryan Frederic, MD;  Location: [facility];  Service: Orthopedics;  Laterality: Left;   KNEE ARTHROPLASTY Right 08/15/2019   Procedure: COMPUTER ASSISTED TOTAL KNEE ARTHROPLASTY;  Surgeon: Bryan Frederic, MD;  Location: [facility];  Service: Orthopedics;  Laterality: Right;   LEG SURGERY Right early 80's    Prior to Admission medications   Medication Sig Start Date End Date Taking? Authorizing Provider  acetaminophen (TYLENOL) 500 MG tablet Take 500 mg by mouth every 6 (six) hours as needed for mild pain.   Yes [provider]  allopurinol (ZYLOPRIM) 300 MG tablet Take 1 tablet (300 mg total) by mouth daily. 08/28/23  Yes Bryan Atkinson, Bryan Davenport, NP  amLODipine (NORVASC) 5 MG tablet Take 0.5 tablets (2.5 mg total) by mouth daily. 03/28/22  Yes Bryan Loll, MD  Apple Cider Vinegar 500 MG TABS Take 500 mg by mouth daily.    Yes [provider]  aspirin EC 81 MG tablet Take 81 mg by mouth daily. Swallow whole.   Yes [provider]  carvedilol (COREG) 25 MG tablet Take 25 mg by mouth 2 (two) times daily. 12/03/20  Yes [provider]  Co-Enzyme Q10 100 MG CAPS Take  100 mg by mouth daily.    Yes [provider]  Cranberry 500 MG TABS Take 500 mg by mouth daily.   Yes [provider]  fenofibrate (TRICOR) 145 MG tablet Take 145 mg by mouth daily.   Yes [provider]  ferrous sulfate 325 (65 FE) MG tablet Take 325 mg by mouth daily with breakfast.   Yes [provider]  Flaxseed, Linseed, (FLAXSEED OIL) 1000 MG CAPS Take 1,000 mg by mouth daily.   Yes [provider]  furosemide (LASIX) 20 MG tablet Take 1 tablet (20 mg total) by mouth 2 (two) times daily. 03/27/22  Yes Bryan Loll, MD  glipiZIDE (GLUCOTROL XL) 10 MG 24 hr tablet Take 1 tablet (10 mg total) by mouth daily with breakfast. 08/28/23  Yes Bryan Atkinson, Bryan Davenport, NP  glucose blood test strip 1 each by Other route as needed for other. Use as instructed   Yes [provider]  hydrALAZINE (APRESOLINE) 100 MG tablet Take 50 mg by mouth 2 (two) times daily. 05/19/22  Yes [provider]  Bryan Atkinson Oil 500 MG CAPS Take 500 mg by mouth 2 (two) times a day.   Yes [provider]  levothyroxine (SYNTHROID) 75 MCG tablet TAKE 1 TABLET BY MOUTH DAILY BEFORE BREAKFAST. 08/29/23  Yes Bryan Atkinson, Bryan Davenport, NP  losartan (COZAAR) 25 MG tablet Take 12.5 mg by mouth daily. 05/16/22  Yes [provider]  Multiple Vitamin (MULTIVITAMIN) tablet Take 1 tablet by mouth daily.   Yes [provider]  Niacin CR 1000 MG TBCR TAKE 1 TABLET (1,000 MG TOTAL) BY MOUTH EVERY EVENING. 08/07/23  Yes Bryan Atkinson, Bryan Davenport, NP  pantoprazole (PROTONIX) 40 MG tablet Take 1 tablet (40 mg total) by mouth 2 (two) times daily before a meal. 08/24/23  Yes Bryan Atkinson, Bryan Friends, MD  pravastatin (PRAVACHOL) 40 MG tablet Take 40 mg by mouth at bedtime.    Yes [provider]  traZODone (DESYREL) 50 MG tablet Take 50 mg by mouth at bedtime.    Yes [provider]  Turmeric 500 MG CAPS Take 500 mg by mouth daily.   Yes [provider]  FARXIGA 5 MG TABS tablet Take 5 mg by mouth every morning. 02/17/22   [provider]    Allergies as of 08/23/2023   (No Known Allergies)    Family History  Problem Relation Age of Onset   Colon cancer Mother        She died from her malignancy, 57   Lung cancer Father        Died from lung cancer, age 41   Emphysema Brother        Died at 92 from lung disease   COPD Brother    Dementia Sister    Healthy Son    Healthy Daughter     Social History   Socioeconomic History   Marital status: Married    Spouse name: Not on file   Number of children: 2   Years of education: Not on file   Highest education level: Not on  file  Occupational History   Occupation: Truck Copy: runs a Designer, television/film set as well, [employer] in [place]  Tobacco Use   Smoking status: Never   Smokeless tobacco: Never  Vaping Use   Vaping status: Never Used  Substance and Sexual Activity   Alcohol use: Yes    Comment: occasionally   Drug use: No   Sexual activity: Yes  Other Topics Concern   Not on file  Social History Narrative   Daughter is 83, Son is 70.  3 grandchildren. Married for 34 years   Social Determinants of Health   Financial Resource Strain: Low Risk  (07/18/2023)   Overall Financial Resource Strain (CARDIA)    Difficulty of Paying Living Expenses: Not hard at all  Food Insecurity: No Food Insecurity (07/18/2023)   Hunger Vital Sign    Worried About Running Out of Food in the Last Year: Never true    Ran Out of Food in the Last Year: Never true  Transportation Needs: No Transportation Needs (07/18/2023)   PRAPARE - Administrator, Civil Service (Medical): No    Lack of Transportation (Non-Medical): No  Physical Activity: Inactive (07/18/2023)   Exercise Vital Sign    Days of Exercise per Week: 1 day    Minutes of Exercise per Session: 0 min  Stress: No Stress Concern Present (07/18/2023)   [employer] of Occupational Health - Occupational  Stress Questionnaire    Feeling of Stress : Not at all  Social Connections: Unknown (07/18/2023)   Social Connection and Isolation Panel [NHANES]    Frequency of Communication with Atkinson and Family: More than three times a week    Frequency of Social Gatherings with Atkinson and Family: Once a week    Attends Religious Services: Patient declined    Database administrator or Organizations: No    Attends Engineer, structural: Not on file    Marital Status: Married  Catering manager Violence: Not on file    Review of Systems: See HPI, otherwise negative ROS  Physical Exam: BP (!) 176/99   Pulse (!) 102   Temp 97.7 F (36.5 C) (Oral)   Resp 17   Ht 5\' 9"  (1.753 m)   Wt 114.3 kg   SpO2 99%   BMI 37.21 kg/m  General:   Alert,  Well-developed, well-nourished, pleasant and cooperative in NAD Neck:  Supple; no masses or thyromegaly. No significant cervical adenopathy. Lungs:  Clear throughout to auscultation.   No wheezes, crackles, or rhonchi. No acute distress. Heart:  Regular rate and rhythm; no murmurs, clicks, rubs,  or gallops. Abdomen: Non-distended, normal bowel sounds.  Soft and nontender without appreciable mass or hepatosplenomegaly.  Impression/Plan: 67 year old here for high risk screening colonoscopy The risks, benefits, limitations, alternatives and imponderables have been reviewed with the patient. Questions have been answered. All parties are agreeable.       Notice: This dictation was prepared with Dragon dictation along with smaller phrase technology. Any transcriptional errors that result from this process are unintentional and may not be corrected upon review.

## 2023-09-29 LAB — SURGICAL PATHOLOGY

## 2023-10-01 NOTE — Anesthesia Postprocedure Evaluation (Signed)
Anesthesia Post Note  Patient: Bryan Atkinson  Procedure(s) Performed: COLONOSCOPY WITH PROPOFOL POLYPECTOMY  Patient location during evaluation: Phase II Anesthesia Type: General Level of consciousness: awake Pain management: pain level controlled Vital Signs Assessment: post-procedure vital signs reviewed and stable Respiratory status: spontaneous breathing and respiratory function stable Cardiovascular status: blood pressure returned to baseline and stable Postop Assessment: no headache and no apparent nausea or vomiting Anesthetic complications: no Comments: Late entry   No notable events documented.   Last Vitals:  Vitals:   09/27/23 1417 09/27/23 1430  BP: (!) 82/61 117/79  Pulse: 95   Resp: 20   Temp: 36.6 C   SpO2: 94%     Last Pain:  Vitals:   09/28/23 1430  TempSrc:   PainSc: 0-No pain                 Windell Norfolk

## 2023-10-04 ENCOUNTER — Encounter (HOSPITAL_COMMUNITY): Payer: Self-pay | Admitting: Internal Medicine

## 2023-10-16 ENCOUNTER — Encounter: Payer: Self-pay | Admitting: Internal Medicine

## 2023-10-19 NOTE — Progress Notes (Deleted)
 Established Patient Office Visit  Subjective  Patient ID: Bryan Atkinson, male    DOB: [birth year]  Age: 68 y.o. MRN: [id number]  No chief complaint on file.   HPI Diabetes Mellitus Type II, Follow-up  Lab Results  Component Value Date   HGBA1C 6.3 (H) 07/19/2023   HGBA1C 5.3 03/24/2022   HGBA1C 5.8 (H) 06/25/2019   Wt Readings from Last 3 Encounters:  09/27/23 251 lb 15.8 oz (114.3 kg)  09/22/23 251 lb 15.8 oz (114.3 kg)  08/22/23 252 lb (114.3 kg)   Last seen for diabetes {1-12:18279} {days/wks/mos/yrs:310907} ago.  Management since then includes ***. He reports {excellent/good/fair/poor:19665} compliance with treatment. He {is/is not:21021397} having side effects. {document side effects if present:1} Symptoms: {Yes/No:20286} fatigue {Yes/No:20286} foot ulcerations  {Yes/No:20286} appetite changes {Yes/No:20286} nausea  {Yes/No:20286} paresthesia of the feet  {Yes/No:20286} polydipsia  {Yes/No:20286} polyuria {Yes/No:20286} visual disturbances   {Yes/No:20286} vomiting     Home blood sugar records: {diabetes glucometry results:16657}  Episodes of hypoglycemia? {Yes/No:20286} {enter symptoms and frequency of symptoms if yes:1}   Current insulin  regiment: {enter 'none' or type of insulin  and number of units taken with each dose of each insulin  formulation that the patient is taking:1} Most Recent Eye Exam: *** {Current exercise:16438:::1} {Current diet habits:16563:::1}  Pertinent Labs: Lab Results  Component Value Date   CHOL 172 07/19/2023   HDL 38 (L) 07/19/2023   LDLCALC 84 07/19/2023   TRIG 301 (H) 07/19/2023   CHOLHDL 4.5 07/19/2023   Lab Results  Component Value Date   NA 134 (L) 09/22/2023   K 3.9 09/22/2023   CREATININE 2.27 (H) 09/22/2023   GFRNONAA 31 (L) 09/22/2023    GERD, Follow up:  The patient was last seen for GERD {NUMBERS 1-12:18279} {days/wks/mos/yrs:310907} ago. Changes made since that visit include ***.  He reports  {excellent/good/fair/poor:19665} compliance with treatment. He {is/is not:21021397} having side effects. ***.  He IS experiencing {Sx; ge reflux:19417}. He is NOT experiencing {Sx; ge reflux:19417:o}  -Hypothyroidism: Patient presents for evaluation of thyroid  function. Symptoms consist of {thyroid  sx:12423}. Symptoms have present for {0-10:33138} {units:11}. The symptoms are {severity:5014}.  The problem has been {clinical course:17::unchanged}.  Previous thyroid  studies include {thyroid  tests:12424}. The hypothyroidism is due to {thyroid  disease:5786::Hypothyroidism}.   Hypertension, follow-up  BP Readings from Last 3 Encounters:  09/27/23 117/79  09/22/23 138/86  08/22/23 (!) 149/100   Wt Readings from Last 3 Encounters:  09/27/23 251 lb 15.8 oz (114.3 kg)  09/22/23 251 lb 15.8 oz (114.3 kg)  08/22/23 252 lb (114.3 kg)     He was last seen for hypertension {NUMBERS 1-12:18279} {days/wks/mos/yrs:310907} ago.  BP at that visit was ***. Management since that visit includes ***.  He reports {excellent/good/fair/poor:19665} compliance with treatment. He {is/is not:9024} having side effects. {document side effects if present:1} He is following a {diet:21022986} diet. He {is/is not:9024} exercising. He {does/does not:200015} smoke.  Use of agents associated with hypertension: {bp agents assoc with hypertension:511::none}.   Outside blood pressures are {***enter patient reported home BP readings, or 'not being checked':1}. Symptoms: {Yes/No:20286} chest pain {Yes/No:20286} chest pressure  {Yes/No:20286} palpitations {Yes/No:20286} syncope  {Yes/No:20286} dyspnea {Yes/No:20286} orthopnea  {Yes/No:20286} paroxysmal nocturnal dyspnea {Yes/No:20286} lower extremity edema   Pertinent labs Lab Results  Component Value Date   CHOL 172 07/19/2023   HDL 38 (L) 07/19/2023   LDLCALC 84 07/19/2023   TRIG 301 (H) 07/19/2023   CHOLHDL 4.5 07/19/2023   Lab Results  Component Value  Date   NA 134 (L) 09/22/2023  K 3.9 09/22/2023   CREATININE 2.27 (H) 09/22/2023   GFRNONAA 31 (L) 09/22/2023   GLUCOSE 138 (H) 09/22/2023   TSH 2.330 08/28/2023     The 10-year ASCVD risk score (Arnett DK, et al., 2019) is: 24.2%  ---------------------------------------------------------------------------------------------------  Patient Active Problem List   Diagnosis Date Noted   Abnormal serum thyroid  stimulating hormone (TSH) level 07/20/2023   Gout 07/19/2023   Screening PSA (prostate specific antigen) 07/19/2023   Routine medical exam 07/19/2023   Encounter for general adult medical examination with abnormal findings 07/19/2023   Obesity with body mass index 30 or greater 07/19/2023   Paronychia of toe 07/19/2023   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   Gastrointestinal hemorrhage    Acute on chronic blood loss anemia 03/24/2022   Type 2 diabetes with nephropathy (HCC) 03/24/2022   Essential hypertension 03/24/2022   Acute renal failure superimposed on stage 3b chronic kidney disease (HCC) 03/24/2022   IDA (iron  deficiency anemia) 09/22/2021   Constipation 09/22/2021   Lumbar radiculopathy 06/16/2021   GERD (gastroesophageal reflux disease) 12/16/2020   Spinal stenosis of lumbar region 08/26/2020   Gastric ulcer 08/25/2020   Scoliosis deformity of spine 07/01/2020   Degeneration of lumbar intervertebral disc 07/01/2020   Hyperlipidemia 06/17/2020   Symptomatic anemia 05/20/2020   Poor appetite 05/20/2020   Abdominal pain 05/20/2020   Nausea without vomiting 05/20/2020   Osteoarthritis of right knee 08/15/2019   Osteoarthritis of left knee 06/27/2019   Past Medical History:  Diagnosis Date   Anemia    Arthritis    Diabetes mellitus without complication (HCC)    GERD (gastroesophageal reflux disease)    Gout    History of kidney stones    Hyperlipidemia    Hypertension    Past Surgical History:  Procedure Laterality Date   BIOPSY  05/25/2020   Procedure: BIOPSY;   Surgeon: Shaaron Lamar HERO, MD;  [facility];  Service: Endoscopy;;   BIOPSY  03/25/2022   Procedure: BIOPSY;  Surgeon: Eartha Angelia Sieving, MD;  Location: [facility];  Service: Gastroenterology;;   BIOPSY  06/15/2022   Procedure: BIOPSY;  Surgeon: Shaaron Lamar HERO, MD;  [facility]: [facility];  Service: Endoscopy;;   CATARACT EXTRACTION W/PHACO Right 05/13/2019   Procedure: CATARACT EXTRACTION PHACO AND INTRAOCULAR LENS PLACEMENT (IOC);  Surgeon: Harrie Agent, MD;  Location: [facility];  Service: Ophthalmology;  Laterality: Right;  CDE: 10.31   CATARACT EXTRACTION W/PHACO Left 05/27/2019   Procedure: CATARACT EXTRACTION PHACO AND INTRAOCULAR LENS PLACEMENT (IOC);  Surgeon: Harrie Agent, MD;  Location: [facility];  Service: Ophthalmology;  Laterality: Left;  CDE: 6.49   COLONOSCOPY     times two 2002 ,2007   COLONOSCOPY N/A 10/07/2013   Procedure: COLONOSCOPY;  Surgeon: Lamar HERO Shaaron, MD;  [facility]: [facility];  Service: Endoscopy;  Laterality: N/A;  8:30 AM   COLONOSCOPY N/A 10/05/2018   Dr. Shaaron: Diverticulosis.  Next colonoscopy 5 years due to first-degree relative with colon cancer   COLONOSCOPY WITH PROPOFOL  N/A 09/27/2023   Procedure: COLONOSCOPY WITH PROPOFOL ;  Surgeon: Shaaron Lamar HERO, MD;  [facility];  Service: Endoscopy;  Laterality: N/A;  12:30pm, asa 3   ESOPHAGOGASTRODUODENOSCOPY (EGD) WITH PROPOFOL  N/A 05/25/2020   [name]: 2 gastric ulcers.  No H. pylori.  Likely NSAID related.  Repeat EGD in 3 months   ESOPHAGOGASTRODUODENOSCOPY (EGD) WITH PROPOFOL  N/A 09/17/2020   Procedure: ESOPHAGOGASTRODUODENOSCOPY (EGD) WITH PROPOFOL ;  Surgeon: Shaaron Lamar HERO, MD;  [facility];  Service: Endoscopy;  Laterality: N/A;  10:00am   ESOPHAGOGASTRODUODENOSCOPY (EGD) WITH PROPOFOL  N/A 03/25/2022   Procedure: ESOPHAGOGASTRODUODENOSCOPY (EGD) WITH PROPOFOL ;  Surgeon: Eartha Angelia Sieving, MD;  [facility];  Service: Gastroenterology;  Laterality:  N/A;   ESOPHAGOGASTRODUODENOSCOPY (EGD) WITH PROPOFOL  N/A 06/15/2022   Procedure: ESOPHAGOGASTRODUODENOSCOPY (EGD) WITH PROPOFOL ;  Surgeon: Shaaron Lamar HERO, MD;  [facility];  Service: Endoscopy;  Laterality: N/A;  7:30am   JOINT REPLACEMENT     KNEE ARTHROPLASTY Left 06/27/2019   Procedure: COMPUTER ASSISTED TOTAL KNEE ARTHROPLASTY;  Surgeon: Fidel Rogue, MD;  Location: [facility];  Service: Orthopedics;  Laterality: Left;   KNEE ARTHROPLASTY Right 08/15/2019   Procedure: COMPUTER ASSISTED TOTAL KNEE ARTHROPLASTY;  Surgeon: Fidel Rogue, MD;  Location: [facility];  Service: Orthopedics;  Laterality: Right;   LEG SURGERY Right early 80's   POLYPECTOMY  09/27/2023   Procedure: POLYPECTOMY;  Surgeon: Shaaron Lamar HERO, MD;  [facility];  Service: Endoscopy;;   Social History   Tobacco Use   Smoking status: Never   Smokeless tobacco: Never  Vaping Use   Vaping status: Never Used  Substance Use Topics   Alcohol  use: Yes    Comment: occasionally   Drug use: No   Social History   Socioeconomic History   Marital status: Married    Spouse name: Not on file   Number of children: 2   Years of education: Not on file   Highest education level: 10th grade  Occupational History   Occupation: Truck Hospital Doctor    Comment: runs a designer, television/film set as well, [employer] in [place]  Tobacco Use   Smoking status: Never   Smokeless tobacco: Never  Vaping Use   Vaping status: Never Used  Substance and Sexual Activity   Alcohol  use: Yes    Comment: occasionally   Drug use: No   Sexual activity: Yes  Other Topics Concern   Not on file  Social History Narrative   Daughter is 18, Son is 49.  3 grandchildren. Married for 34 years   Social Drivers of Corporate Investment Banker Strain: Low Risk  (10/19/2023)   Overall Financial Resource Strain (CARDIA)    Difficulty of Paying Living Expenses: Not very hard  Food Insecurity: No Food Insecurity (10/19/2023)   Hunger Vital Sign     Worried About Running Out of Food in the Last Year: Never true    Ran Out of Food in the Last Year: Never true  Transportation Needs: No Transportation Needs (10/19/2023)   PRAPARE - Administrator, Civil Service (Medical): No    Lack of Transportation (Non-Medical): No  Physical Activity: Unknown (10/19/2023)   Exercise Vital Sign    Days of Exercise per Week: Patient declined    Minutes of Exercise per Session: 0 min  Stress: No Stress Concern Present (10/19/2023)   [employer] of Occupational Health - Occupational Stress Questionnaire    Feeling of Stress : Not at all  Social Connections: Unknown (10/19/2023)   Social Connection and Isolation Panel [NHANES]    Frequency of Communication with Friends and Family: Three times a week    Frequency of Social Gatherings with Friends and Family: Once a week    Attends Religious Services: Patient declined    Database Administrator or Organizations: No    Attends Engineer, Structural: Not on file    Marital Status: Married  Catering Manager Violence: Not on file   Family Status  Relation Name Status   Mother  Deceased   Father  Deceased   Brother  Deceased   Sister  Alive   MGM  Deceased   MGF  Deceased   PGM  Deceased   PGF  Deceased   Son  Alive   Daughter  Alive  No partnership data on file   Family History  Problem Relation Age of Onset   Colon cancer Mother        She died from her malignancy, 11   Lung cancer Father        Died from lung cancer, age 60   Emphysema Brother        Died at 87 from lung disease   COPD Brother    Dementia Sister    Healthy Son    Healthy Daughter    No Known Allergies    ROS Negative unless indicated in HPI   Objective:     There were no vitals taken for this visit. BP Readings from Last 3 Encounters:  09/27/23 117/79  09/22/23 138/86  08/22/23 (!) 149/100   Wt Readings from Last 3 Encounters:  09/27/23 251 lb 15.8 oz (114.3 kg)  09/22/23 251 lb 15.8 oz  (114.3 kg)  08/22/23 252 lb (114.3 kg)      Physical Exam   No results found for any visits on 10/23/23.  Last CBC Lab Results  Component Value Date   WBC 10.8 (H) 09/22/2023   HGB 12.5 (L) 09/22/2023   HCT 38.6 (L) 09/22/2023   MCV 94.8 09/22/2023   MCH 30.7 09/22/2023   RDW 13.8 09/22/2023   PLT 334 09/22/2023   Last metabolic panel Lab Results  Component Value Date   GLUCOSE 138 (H) 09/22/2023   NA 134 (L) 09/22/2023   K 3.9 09/22/2023   CL 100 09/22/2023   CO2 21 (L) 09/22/2023   BUN 41 (H) 09/22/2023   CREATININE 2.27 (H) 09/22/2023   GFRNONAA 31 (L) 09/22/2023   CALCIUM  10.0 09/22/2023   PHOS 3.0 04/04/2023   PROT 7.3 11/11/2022   ALBUMIN 3.8 04/04/2023   LABGLOB 3.5 06/01/2020   AGRATIO 0.9 06/01/2020   BILITOT 0.5 11/11/2022   ALKPHOS 48 11/11/2022   AST 24 11/11/2022   ALT 20 11/11/2022   ANIONGAP 13 09/22/2023   Last lipids Lab Results  Component Value Date   CHOL 172 07/19/2023   HDL 38 (L) 07/19/2023   LDLCALC 84 07/19/2023   TRIG 301 (H) 07/19/2023   CHOLHDL 4.5 07/19/2023   Last hemoglobin A1c Lab Results  Component Value Date   HGBA1C 6.3 (H) 07/19/2023   Last thyroid  functions Lab Results  Component Value Date   TSH 2.330 08/28/2023   T4TOTAL 8.9 08/03/2023        Assessment & Plan:  There are no diagnoses linked to this encounter. Continue healthy lifestyle choices, including diet (rich in fruits, vegetables, and lean proteins, and low in salt and simple carbohydrates) and exercise (at least 30 minutes of moderate physical activity daily).     The above assessment and management plan was discussed with the patient. The patient verbalized understanding of and has agreed to the management plan. Patient is aware to call the clinic if they develop any new symptoms or if symptoms persist or worsen. Patient is aware when to return to the clinic for a follow-up visit. Patient educated on when it is appropriate to go to the emergency  department.  No follow-ups on file.  Aden Youngman [place] Thompson, DNP [facility] [address] [place], [place] [address] [phone]    Note: This document was prepared by [employer] voice dictation technology and any errors that results from this process are unintentional.

## 2023-10-23 ENCOUNTER — Ambulatory Visit: Payer: Medicare HMO | Admitting: Nurse Practitioner

## 2023-10-24 ENCOUNTER — Ambulatory Visit (INDEPENDENT_AMBULATORY_CARE_PROVIDER_SITE_OTHER): Payer: Medicare HMO | Admitting: Nurse Practitioner

## 2023-10-24 ENCOUNTER — Encounter: Payer: Self-pay | Admitting: Nurse Practitioner

## 2023-10-24 VITALS — BP 103/78 | HR 96 | Temp 98.0°F | Ht 69.0 in | Wt 249.0 lb

## 2023-10-24 DIAGNOSIS — Z23 Encounter for immunization: Secondary | ICD-10-CM | POA: Insufficient documentation

## 2023-10-24 DIAGNOSIS — E1121 Type 2 diabetes mellitus with diabetic nephropathy: Secondary | ICD-10-CM | POA: Diagnosis not present

## 2023-10-24 DIAGNOSIS — N184 Chronic kidney disease, stage 4 (severe): Secondary | ICD-10-CM | POA: Insufficient documentation

## 2023-10-24 DIAGNOSIS — I10 Essential (primary) hypertension: Secondary | ICD-10-CM

## 2023-10-24 DIAGNOSIS — E039 Hypothyroidism, unspecified: Secondary | ICD-10-CM | POA: Diagnosis not present

## 2023-10-24 DIAGNOSIS — E782 Mixed hyperlipidemia: Secondary | ICD-10-CM

## 2023-10-24 DIAGNOSIS — Z7984 Long term (current) use of oral hypoglycemic drugs: Secondary | ICD-10-CM

## 2023-10-24 DIAGNOSIS — K219 Gastro-esophageal reflux disease without esophagitis: Secondary | ICD-10-CM

## 2023-10-24 DIAGNOSIS — M1A2790 Drug-induced chronic gout, unspecified ankle and foot, without tophus (tophi): Secondary | ICD-10-CM

## 2023-10-24 LAB — BAYER DCA HB A1C WAIVED: HB A1C (BAYER DCA - WAIVED): 6 % — ABNORMAL HIGH (ref 4.8–5.6)

## 2023-10-24 MED ORDER — ALLOPURINOL 300 MG PO TABS: 300.0000 mg | ORAL_TABLET | Freq: Every day | ORAL | 0 refills | Status: DC

## 2023-10-24 MED ORDER — LEVOTHYROXINE SODIUM 75 MCG PO TABS: 75.0000 ug | ORAL_TABLET | Freq: Every day | ORAL | 0 refills | Status: DC

## 2023-10-24 MED ORDER — GLIPIZIDE ER 10 MG PO TB24: 10.0000 mg | ORAL_TABLET | Freq: Every day | ORAL | 0 refills | Status: DC

## 2023-10-24 MED ORDER — NIACIN ER 1000 MG PO TBCR: 1000.0000 mg | EXTENDED_RELEASE_TABLET | Freq: Every evening | ORAL | 0 refills | Status: DC

## 2023-10-24 NOTE — Progress Notes (Signed)
 Established Patient Office Visit  Subjective  Patient ID: Bryan Atkinson, male    DOB: April 10, 1956  Age: 68 y.o. MRN: [id number]  Chief Complaint  Patient presents with   Medical Management of Chronic Issues    3 month    HPI Bryan Atkinson is a 68 yrs old male presents 10/24/2023 for 32-months f/o for chronic diseass management, no other concern.   Diabetes Mellitus Type II, Follow-up  Lab Results  Component Value Date   HGBA1C 6.0 (H) 10/24/2023   HGBA1C 6.3 (H) 07/19/2023   HGBA1C 5.3 03/24/2022   Wt Readings from Last 3 Encounters:  10/24/23 249 lb (112.9 kg)  09/27/23 251 lb 15.8 oz (114.3 kg)  09/22/23 251 lb 15.8 oz (114.3 kg)   Last seen for diabetes 3 months ago.  Management since then includes glipizide  and Farxiga. He reports excellent compliance with treatment. He is not having side effects.  Symptoms:  fatigue No foot ulcerations  No appetite changes No nausea  No paresthesia of the feet  No polydipsia  No polyuria No visual disturbances   No vomiting   Home blood sugar records: fasting range: 120 Episodes of hypoglycemia? No  Current insulin  regiment:none Current exercise: none Current diet habits: in general, a healthy diet    Pertinent Labs: Lab Results  Component Value Date   CHOL 172 07/19/2023   HDL 38 (L) 07/19/2023   LDLCALC 84 07/19/2023   TRIG 301 (H) 07/19/2023   CHOLHDL 4.5 07/19/2023   Lab Results  Component Value Date   NA 134 (L) 09/22/2023   K 3.9 09/22/2023   CREATININE 2.27 (H) 09/22/2023   GFRNONAA 31 (L) 09/22/2023     Hypothyroidism: Patient presents for f/u for Hypothyroidism current being managed with synthroid  75 mcg daily. Denies fatigue, weight changes, heat/cold intolerance, bowel/skin changes or CVS symptoms.   Previous thyroid  studies include TSH 2.330. will continue current dose  Hypertension, follow-up  BP Readings from Last 3 Encounters:  10/24/23 103/78  09/27/23 117/79  09/22/23 138/86   Wt  Readings from Last 3 Encounters:  10/24/23 249 lb (112.9 kg)  09/27/23 251 lb 15.8 oz (114.3 kg)  09/22/23 251 lb 15.8 oz (114.3 kg)     He was last seen for hypertension 3 months ago.  BP at that visit was 117/79. Management since that visit includes Amlodipine  5 mg daily, coreg 25 mg.  He reports excellent compliance with treatment. He is not having side effects.  He is following a Regular diet. He is not exercising. He does not smoke. Use of agents associated with hypertension: none.  Outside blood pressures are not being monitored  symptoms: No chest pain No chest pressure  No palpitations No syncope  No dyspnea No orthopnea  No paroxysmal nocturnal dyspnea No lower extremity edema   Pertinent labs Lab Results  Component Value Date   CHOL 172 07/19/2023   HDL 38 (L) 07/19/2023   LDLCALC 84 07/19/2023   TRIG 301 (H) 07/19/2023   CHOLHDL 4.5 07/19/2023   Lab Results  Component Value Date   NA 134 (L) 09/22/2023   K 3.9 09/22/2023   CREATININE 2.27 (H) 09/22/2023   GFRNONAA 31 (L) 09/22/2023   GLUCOSE 138 (H) 09/22/2023   TSH 2.330 08/28/2023     The 10-year ASCVD risk score (Arnett DK, et al., 2019) is: 19.8%  GERD, Follow up:  The patient was last seen for GERD 3 months ago. Changes made since that visit include pantoprazole  40  mg daily.  He reports excellent compliance with treatment. He is not having side effects. SABRA  He IS experiencing  none . He is NOT experiencing abdominal bloating, belching, belching and eructation, chest pain, choking on food, dysphagia, heartburn, melena, or nausea  Gout: hx of gout currently being managed with Allopurinol  300 mg daily. The patient reports no acute gout attacks since last clinic visit. Attacks occur primarily in the left foot. Patient reports his chronic pain is completed, his joint stiffness is completed and his joint swelling is completed. Limitation on activities include none. The patient is not avoiding high purine  foods and reports consuming 0 alcoholic drinks per weeks.   Health maintenance: Diabetic retina exam done Nov 2024 Shingrix  first dose administered 10/24/23   Patient Active Problem List   Diagnosis Date Noted   Chronic kidney disease, stage 4 (severe) (HCC) 10/24/2023   Obesity, morbid (HCC) 10/24/2023   Immunization due 10/24/2023   Abnormal serum thyroid  stimulating hormone (TSH) level 07/20/2023   Gout 07/19/2023   Screening PSA (prostate specific antigen) 07/19/2023   Routine medical exam 07/19/2023   Encounter for general adult medical examination with abnormal findings 07/19/2023   Obesity with body mass index 30 or greater 07/19/2023   Paronychia of toe 07/19/2023   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   Gastrointestinal hemorrhage    Acute on chronic blood loss anemia 03/24/2022   Type 2 diabetes with nephropathy (HCC) 03/24/2022   Essential hypertension 03/24/2022   Acute renal failure superimposed on stage 3b chronic kidney disease (HCC) 03/24/2022   IDA (iron  deficiency anemia) 09/22/2021   Constipation 09/22/2021   Lumbar radiculopathy 06/16/2021   GERD (gastroesophageal reflux disease) 12/16/2020   Spinal stenosis of lumbar region 08/26/2020   Gastric ulcer 08/25/2020   Scoliosis deformity of spine 07/01/2020   Degeneration of lumbar intervertebral disc 07/01/2020   Hyperlipidemia 06/17/2020   Symptomatic anemia 05/20/2020   Poor appetite 05/20/2020   Abdominal pain 05/20/2020   Nausea without vomiting 05/20/2020   Osteoarthritis of right knee 08/15/2019   Osteoarthritis of left knee 06/27/2019   Past Medical History:  Diagnosis Date   Anemia    Arthritis    Diabetes mellitus without complication (HCC)    GERD (gastroesophageal reflux disease)    Gout    History of kidney stones    Hyperlipidemia    Hypertension    Past Surgical History:  Procedure Laterality Date   BIOPSY  05/25/2020   Procedure: BIOPSY;  Surgeon: Shaaron Lamar HERO, MD;  [facility];   Service: Endoscopy;;   BIOPSY  03/25/2022   Procedure: BIOPSY;  Surgeon: Eartha Angelia Sieving, MD;  [facility]: [facility];  Service: Gastroenterology;;   BIOPSY  06/15/2022   Procedure: BIOPSY;  Surgeon: Shaaron Lamar HERO, MD;  [facility]: [facility];  Service: Endoscopy;;   CATARACT EXTRACTION W/PHACO Right 05/13/2019   Procedure: CATARACT EXTRACTION PHACO AND INTRAOCULAR LENS PLACEMENT (IOC);  Surgeon: Harrie Agent, MD;  Location: [facility];  Service: Ophthalmology;  Laterality: Right;  CDE: 10.31   CATARACT EXTRACTION W/PHACO Left 05/27/2019   Procedure: CATARACT EXTRACTION PHACO AND INTRAOCULAR LENS PLACEMENT (IOC);  Surgeon: Harrie Agent, MD;  Location: [facility];  Service: Ophthalmology;  Laterality: Left;  CDE: 6.49   COLONOSCOPY     times two 2002 ,2007   COLONOSCOPY N/A 10/07/2013   Procedure: COLONOSCOPY;  Surgeon: Lamar HERO Shaaron, MD;  [facility]: [facility];  Service: Endoscopy;  Laterality: N/A;  8:30 AM   COLONOSCOPY N/A  10/05/2018   Dr. Shaaron: Diverticulosis.  Next colonoscopy 5 years due to first-degree relative with colon cancer   COLONOSCOPY WITH PROPOFOL  N/A 09/27/2023   Procedure: COLONOSCOPY WITH PROPOFOL ;  Surgeon: Shaaron Lamar HERO, MD;  [facility];  Service: Endoscopy;  Laterality: N/A;  12:30pm, asa 3   ESOPHAGOGASTRODUODENOSCOPY (EGD) WITH PROPOFOL  N/A 05/25/2020   [name]: 2 gastric ulcers.  No H. pylori.  Likely NSAID related.  Repeat EGD in 3 months   ESOPHAGOGASTRODUODENOSCOPY (EGD) WITH PROPOFOL  N/A 09/17/2020   Procedure: ESOPHAGOGASTRODUODENOSCOPY (EGD) WITH PROPOFOL ;  Surgeon: Shaaron Lamar HERO, MD;  [facility];  Service: Endoscopy;  Laterality: N/A;  10:00am   ESOPHAGOGASTRODUODENOSCOPY (EGD) WITH PROPOFOL  N/A 03/25/2022   Procedure: ESOPHAGOGASTRODUODENOSCOPY (EGD) WITH PROPOFOL ;  Surgeon: Eartha Angelia Sieving, MD;  [facility];  Service: Gastroenterology;  Laterality: N/A;   ESOPHAGOGASTRODUODENOSCOPY (EGD) WITH PROPOFOL  N/A  06/15/2022   Procedure: ESOPHAGOGASTRODUODENOSCOPY (EGD) WITH PROPOFOL ;  Surgeon: Shaaron Lamar HERO, MD;  [facility];  Service: Endoscopy;  Laterality: N/A;  7:30am   JOINT REPLACEMENT     KNEE ARTHROPLASTY Left 06/27/2019   Procedure: COMPUTER ASSISTED TOTAL KNEE ARTHROPLASTY;  Surgeon: Fidel Rogue, MD;  Location: [facility];  Service: Orthopedics;  Laterality: Left;   KNEE ARTHROPLASTY Right 08/15/2019   Procedure: COMPUTER ASSISTED TOTAL KNEE ARTHROPLASTY;  Surgeon: Fidel Rogue, MD;  Location: [facility];  Service: Orthopedics;  Laterality: Right;   LEG SURGERY Right early 80's   POLYPECTOMY  09/27/2023   Procedure: POLYPECTOMY;  Surgeon: Shaaron Lamar HERO, MD;  [facility];  Service: Endoscopy;;   Social History   Tobacco Use   Smoking status: Never   Smokeless tobacco: Never  Vaping Use   Vaping status: Never Used  Substance Use Topics   Alcohol  use: Yes    Comment: occasionally   Drug use: No   Social History   Socioeconomic History   Marital status: Married    Spouse name: Not on file   Number of children: 2   Years of education: Not on file   Highest education level: 10th grade  Occupational History   Occupation: Truck Hospital Doctor    Comment: runs a designer, television/film set as well, [employer] in [place]  Tobacco Use   Smoking status: Never   Smokeless tobacco: Never  Vaping Use   Vaping status: Never Used  Substance and Sexual Activity   Alcohol  use: Yes    Comment: occasionally   Drug use: No   Sexual activity: Yes  Other Topics Concern   Not on file  Social History Narrative   Daughter is 21, Son is 36.  3 grandchildren. Married for 34 years   Social Drivers of Corporate Investment Banker Strain: Low Risk  (10/19/2023)   Overall Financial Resource Strain (CARDIA)    Difficulty of Paying Living Expenses: Not very hard  Food Insecurity: No Food Insecurity (10/19/2023)   Hunger Vital Sign    Worried About Running Out of Food in the Last Year: Never true     Ran Out of Food in the Last Year: Never true  Transportation Needs: No Transportation Needs (10/19/2023)   PRAPARE - Administrator, Civil Service (Medical): No    Lack of Transportation (Non-Medical): No  Physical Activity: Unknown (10/19/2023)   Exercise Vital Sign    Days of Exercise per Week: Patient declined    Minutes of Exercise per Session: 0 min  Stress: No Stress Concern Present (10/19/2023)  [employer] of Occupational Health - Occupational Stress Questionnaire    Feeling of Stress : Not at all  Social Connections: Unknown (10/19/2023)   Social Connection and Isolation Panel [NHANES]    Frequency of Communication with Friends and Family: Three times a week    Frequency of Social Gatherings with Friends and Family: Once a week    Attends Religious Services: Patient declined    Database Administrator or Organizations: No    Attends Engineer, Structural: Not on file    Marital Status: Married  Catering Manager Violence: Not on file   Family Status  Relation Name Status   Mother  Deceased   Father  Deceased   Brother  Deceased   Sister  Alive   MGM  Deceased   MGF  Deceased   PGM  Deceased   PGF  Deceased   Son  Alive   Daughter  Alive  No partnership data on file   Family History  Problem Relation Age of Onset   Colon cancer Mother        She died from her malignancy, 3   Lung cancer Father        Died from lung cancer, age 38   Emphysema Brother        Died at 59 from lung disease   COPD Brother    Dementia Sister    Healthy Son    Healthy Daughter    No Known Allergies    Review of Systems  Constitutional:  Negative for chills and fever.  HENT:  Negative for congestion and sore throat.   Respiratory:  Negative for cough and shortness of breath.   Cardiovascular:  Negative for chest pain and claudication.  Gastrointestinal:  Negative for constipation, diarrhea, nausea and vomiting.  Skin:  Negative for itching and rash.   Neurological:  Negative for dizziness and headaches.  Endo/Heme/Allergies:  Negative for environmental allergies and polydipsia. Does not bruise/bleed easily.  Psychiatric/Behavioral:  Negative for suicidal ideas. The patient does not have insomnia.    Negative unless indicated in HPI   Objective:     BP 103/78 (Cuff Size: Normal)   Pulse 96   Temp 98 F (36.7 C) (Temporal)   Ht 5' 9 (1.753 m)   Wt 249 lb (112.9 kg)   SpO2 96%   BMI 36.77 kg/m  BP Readings from Last 3 Encounters:  10/24/23 103/78  09/27/23 117/79  09/22/23 138/86   Wt Readings from Last 3 Encounters:  10/24/23 249 lb (112.9 kg)  09/27/23 251 lb 15.8 oz (114.3 kg)  09/22/23 251 lb 15.8 oz (114.3 kg)      Physical Exam Vitals and nursing note reviewed.  Constitutional:      Appearance: He is obese.  HENT:     Head: Normocephalic and atraumatic.     Nose: Nose normal.     Mouth/Throat:     Mouth: Mucous membranes are moist.  Eyes:     General: No scleral icterus.    Conjunctiva/sclera: Conjunctivae normal.     Pupils: Pupils are equal, round, and reactive to light.  Cardiovascular:     Rate and Rhythm: Normal rate and regular rhythm.  Pulmonary:     Effort: Pulmonary effort is normal.     Breath sounds: Normal breath sounds.  Musculoskeletal:        General: Normal range of motion.     Right lower leg: No edema.     Left lower leg: No  edema.  Skin:    General: Skin is warm and dry.     Findings: No rash.  Neurological:     Mental Status: He is alert and oriented to person, place, and time.  Psychiatric:        Mood and Affect: Mood normal.        Behavior: Behavior normal.        Thought Content: Thought content normal.        Judgment: Judgment normal.      Results for orders placed or performed in visit on 10/24/23  Bayer DCA Hb A1c Waived  Result Value Ref Range   HB A1C (BAYER DCA - WAIVED) 6.0 (H) 4.8 - 5.6 %    Last CBC Lab Results  Component Value Date   WBC 10.8 (H)  09/22/2023   HGB 12.5 (L) 09/22/2023   HCT 38.6 (L) 09/22/2023   MCV 94.8 09/22/2023   MCH 30.7 09/22/2023   RDW 13.8 09/22/2023   PLT 334 09/22/2023   Last metabolic panel Lab Results  Component Value Date   GLUCOSE 138 (H) 09/22/2023   NA 134 (L) 09/22/2023   K 3.9 09/22/2023   CL 100 09/22/2023   CO2 21 (L) 09/22/2023   BUN 41 (H) 09/22/2023   CREATININE 2.27 (H) 09/22/2023   GFRNONAA 31 (L) 09/22/2023   CALCIUM  10.0 09/22/2023   PHOS 3.0 04/04/2023   PROT 7.3 11/11/2022   ALBUMIN 3.8 04/04/2023   LABGLOB 3.5 06/01/2020   AGRATIO 0.9 06/01/2020   BILITOT 0.5 11/11/2022   ALKPHOS 48 11/11/2022   AST 24 11/11/2022   ALT 20 11/11/2022   ANIONGAP 13 09/22/2023   Last lipids Lab Results  Component Value Date   CHOL 172 07/19/2023   HDL 38 (L) 07/19/2023   LDLCALC 84 07/19/2023   TRIG 301 (H) 07/19/2023   CHOLHDL 4.5 07/19/2023   Last hemoglobin A1c Lab Results  Component Value Date   HGBA1C 6.0 (H) 10/24/2023   Last thyroid  functions Lab Results  Component Value Date   TSH 2.330 08/28/2023   T4TOTAL 8.9 08/03/2023        Assessment & Plan:  Essential hypertension  Hypothyroidism (acquired) -     Levothyroxine  Sodium; Take 1 tablet (75 mcg total) by mouth daily before breakfast.  Dispense: 90 tablet; Refill: 0  Type 2 diabetes with nephropathy (HCC) -     glipiZIDE  ER; Take 1 tablet (10 mg total) by mouth daily with breakfast.  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  Dispense: 90 tablet; Refill: 0  Immunization due -     Varicella-zoster vaccine IM  Other orders -     Niacin  ER; Take 1 tablet (1,000 mg  total) by mouth every evening.  Dispense: 90 tablet; Refill: 0   Bryan Atkinson is a 68 year old Caucasian male seen today for chronic disease management, no acute distress Diabetes: A1c today 6.0, will continue glipizide  and Farxiga 5 mg daily; glipizide  refill Hypothyroidism: Continue current dose of Synthroid  75 mcg daily, refill provided Hyperlipidemia: Continue niacin  1000 mg daily and pravastatin  40 mg daily Gout: Continue allopurinol  300 mg daily refill provided HTN: BP well-controlled  controlled with amlodipine  5 mg daily: Carvedilol 25 mg twice daily, hydralazine 50 mg twice daily.  No changes will be made today goal BP is 130/80. Pt aware to report any persistent high or low readings. DASH diet and exercise encouraged. Exercise at least 150 minutes per week and increase as tolerated. Goal BMI > 25. Stress management encouraged. Avoid nicotine and tobacco product use. Avoid excessive alcohol  and NSAID's. Avoid more than 2000 mg of sodium daily. Medications as prescribed. Follow up as scheduled.    Return in about 3 months (around 01/22/2024) for follow-up chronic diseases.    Timya Trimmer [place], DNP [facility] [address] [place], [place] [address] [phone]    Note: This document was prepared by [employer] voice dictation technology and any errors that results from this process are unintentional.

## 2023-10-25 LAB — MICROALBUMIN / CREATININE URINE RATIO
Creatinine, Urine: 113.7 mg/dL
Microalb/Creat Ratio: 143 mg/g{creat} — ABNORMAL HIGH (ref 0–29)
Microalbumin, Urine: 162.9 ug/mL

## 2023-11-22 ENCOUNTER — Inpatient Hospital Stay: Payer: Self-pay

## 2023-11-22 ENCOUNTER — Encounter (HOSPITAL_COMMUNITY): Payer: Self-pay | Admitting: Hematology

## 2023-11-22 ENCOUNTER — Inpatient Hospital Stay: Payer: PPO | Attending: Hematology

## 2023-11-22 DIAGNOSIS — N184 Chronic kidney disease, stage 4 (severe): Secondary | ICD-10-CM | POA: Insufficient documentation

## 2023-11-22 DIAGNOSIS — D631 Anemia in chronic kidney disease: Secondary | ICD-10-CM | POA: Insufficient documentation

## 2023-11-22 DIAGNOSIS — D509 Iron deficiency anemia, unspecified: Secondary | ICD-10-CM | POA: Insufficient documentation

## 2023-11-22 LAB — CBC WITH DIFFERENTIAL/PLATELET
Abs Immature Granulocytes: 0.08 10*3/uL — ABNORMAL HIGH (ref 0.00–0.07)
Basophils Absolute: 0.1 10*3/uL (ref 0.0–0.1)
Basophils Relative: 1 %
Eosinophils Absolute: 0.2 10*3/uL (ref 0.0–0.5)
Eosinophils Relative: 3 %
HCT: 40.6 % (ref 39.0–52.0)
Hemoglobin: 13.7 g/dL (ref 13.0–17.0)
Immature Granulocytes: 1 %
Lymphocytes Relative: 20 %
Lymphs Abs: 1.8 10*3/uL (ref 0.7–4.0)
MCH: 31.5 pg (ref 26.0–34.0)
MCHC: 33.7 g/dL (ref 30.0–36.0)
MCV: 93.3 fL (ref 80.0–100.0)
Monocytes Absolute: 0.8 10*3/uL (ref 0.1–1.0)
Monocytes Relative: 10 %
Neutro Abs: 5.6 10*3/uL (ref 1.7–7.7)
Neutrophils Relative %: 65 %
Platelets: 315 10*3/uL (ref 150–400)
RBC: 4.35 MIL/uL (ref 4.22–5.81)
RDW: 13.6 % (ref 11.5–15.5)
WBC: 8.6 10*3/uL (ref 4.0–10.5)
nRBC: 0 % (ref 0.0–0.2)

## 2023-11-22 LAB — BASIC METABOLIC PANEL
Anion gap: 12 (ref 5–15)
BUN: 43 mg/dL — ABNORMAL HIGH (ref 8–23)
CO2: 23 mmol/L (ref 22–32)
Calcium: 10.2 mg/dL (ref 8.9–10.3)
Chloride: 101 mmol/L (ref 98–111)
Creatinine, Ser: 2.17 mg/dL — ABNORMAL HIGH (ref 0.61–1.24)
GFR, Estimated: 33 mL/min — ABNORMAL LOW (ref >60)
Glucose, Bld: 149 mg/dL — ABNORMAL HIGH (ref 70–99)
Potassium: 4.4 mmol/L (ref 3.5–5.1)
Sodium: 136 mmol/L (ref 135–145)

## 2023-11-22 LAB — FERRITIN: Ferritin: 284 ng/mL (ref 24–336)

## 2023-11-22 LAB — IRON AND TIBC
Iron: 86 ug/dL (ref 45–182)
Saturation Ratios: 25 % (ref 17.9–39.5)
TIBC: 339 ug/dL (ref 250–450)
UIBC: 253 ug/dL

## 2023-11-23 ENCOUNTER — Ambulatory Visit (INDEPENDENT_AMBULATORY_CARE_PROVIDER_SITE_OTHER): Payer: PPO | Admitting: Nurse Practitioner

## 2023-11-23 ENCOUNTER — Encounter: Payer: Self-pay | Admitting: Nurse Practitioner

## 2023-11-23 ENCOUNTER — Other Ambulatory Visit: Payer: Self-pay | Admitting: Nurse Practitioner

## 2023-11-23 VITALS — BP 118/72 | HR 77 | Temp 98.0°F | Ht 69.0 in | Wt 244.0 lb

## 2023-11-23 DIAGNOSIS — D229 Melanocytic nevi, unspecified: Secondary | ICD-10-CM

## 2023-11-23 DIAGNOSIS — E039 Hypothyroidism, unspecified: Secondary | ICD-10-CM

## 2023-11-23 DIAGNOSIS — Z0001 Encounter for general adult medical examination with abnormal findings: Secondary | ICD-10-CM

## 2023-11-23 DIAGNOSIS — Z0000 Encounter for general adult medical examination without abnormal findings: Secondary | ICD-10-CM

## 2023-11-23 MED ORDER — MUPIROCIN CALCIUM 2 % EX CREA: 1.0000 | TOPICAL_CREAM | Freq: Two times a day (BID) | CUTANEOUS | 0 refills | Status: AC

## 2023-11-23 NOTE — Progress Notes (Signed)
 Subjective:   Bryan Atkinson is a 68 y.o. male who presents for an Initial Medicare Annual Wellness Visit., and concerns for abrasion on ' left leg that has been bleeding for 2-days   Visit Complete: In person  Patient Medicare AWV questionnaire was completed by the patient on 10/19/2023; I have confirmed that all information answered by patient is correct and no changes since this date.       Objective:    Today's Vitals   11/23/23 0934  BP: 118/72  Pulse: 77  Temp: 98 F (36.7 C)  TempSrc: Temporal  SpO2: 97%  Weight: 244 lb (110.7 kg)  Height: 5' 9 (1.753 m)   Body mass index is 36.03 kg/m.     11/23/2023    9:26 AM 09/27/2023   11:28 AM 09/22/2023    1:33 PM 05/26/2023   10:10 AM 11/18/2022   10:28 AM 07/15/2022   11:59 AM 06/15/2022    7:20 AM  Advanced Directives  Does Patient Have a Medical Advance Directive? No No No No No No No  Would patient like information on creating a medical advance directive? No - Patient declined No - Patient declined No - Patient declined No - Patient declined No - Patient declined No - Patient declined No - Patient declined    Current Medications (verified) Outpatient Encounter Medications as of 11/23/2023  Medication Sig   acetaminophen  (TYLENOL ) 500 MG tablet Take 500 mg by mouth every 6 (six) hours as needed for mild pain.   allopurinol  (ZYLOPRIM ) 300 MG tablet Take 1 tablet (300 mg total) by mouth daily.   amLODipine  (NORVASC ) 5 MG tablet Take 0.5 tablets (2.5 mg total) by mouth daily.   Apple Cider Vinegar 500 MG TABS Take 500 mg by mouth daily.    aspirin  EC 81 MG tablet Take 81 mg by mouth daily. Swallow whole.   carvedilol (COREG) 25 MG tablet Take 25 mg by mouth 2 (two) times daily.   Co-Enzyme Q10 100 MG CAPS Take 100 mg by mouth daily.    Cranberry 500 MG TABS Take 500 mg by mouth daily.   FARXIGA 5 MG TABS tablet Take 5 mg by mouth every morning.   fenofibrate  (TRICOR ) 145 MG tablet Take 145 mg by mouth daily.    ferrous sulfate 325 (65 FE) MG tablet Take 325 mg by mouth daily with breakfast.   Flaxseed, Linseed, (FLAXSEED OIL) 1000 MG CAPS Take 1,000 mg by mouth daily.   furosemide  (LASIX ) 20 MG tablet Take 1 tablet (20 mg total) by mouth 2 (two) times daily.   glipiZIDE  (GLUCOTROL  XL) 10 MG 24 hr tablet Take 1 tablet (10 mg total) by mouth daily with breakfast.   glucose blood test strip 1 each by Other route as needed for other. Use as instructed   hydrALAZINE (APRESOLINE) 100 MG tablet Take 50 mg by mouth 2 (two) times daily.   Krill Oil 500 MG CAPS Take 500 mg by mouth 2 (two) times a day.   levothyroxine  (SYNTHROID ) 75 MCG tablet Take 1 tablet (75 mcg total) by mouth daily before breakfast.   losartan (COZAAR) 25 MG tablet Take 12.5 mg by mouth daily.   Multiple Vitamin (MULTIVITAMIN) tablet Take 1 tablet by mouth daily.   mupirocin  cream (BACTROBAN ) 2 % Apply 1 Application topically 2 (two) times daily.   Niacin  CR 1000 MG TBCR Take 1 tablet (1,000 mg total) by mouth every evening.   pantoprazole  (PROTONIX ) 40 MG tablet Take 1 tablet (40  mg total) by mouth 2 (two) times daily before a meal.   pravastatin  (PRAVACHOL ) 40 MG tablet Take 40 mg by mouth at bedtime.    traZODone (DESYREL) 50 MG tablet Take 50 mg by mouth at bedtime.    Turmeric 500 MG CAPS Take 500 mg by mouth daily.   No facility-administered encounter medications on file as of 11/23/2023.    Allergies (verified) Patient has no known allergies.   History: Past Medical History:  Diagnosis Date   Anemia    Arthritis    Diabetes mellitus without complication (HCC)    GERD (gastroesophageal reflux disease)    Gout    History of kidney stones    Hyperlipidemia    Hypertension    Past Surgical History:  Procedure Laterality Date   BIOPSY  05/25/2020   Procedure: BIOPSY;  Surgeon: Shaaron Lamar HERO, MD;  [facility];  Service: Endoscopy;;   BIOPSY  03/25/2022   Procedure: BIOPSY;  Surgeon: Eartha Angelia Sieving, MD;   [facility];  Service: Gastroenterology;;   BIOPSY  06/15/2022   Procedure: BIOPSY;  Surgeon: Shaaron Lamar HERO, MD;  [facility];  Service: Endoscopy;;   CATARACT EXTRACTION W/PHACO Right 05/13/2019   Procedure: CATARACT EXTRACTION PHACO AND INTRAOCULAR LENS PLACEMENT (IOC);  Surgeon: Harrie Agent, MD;  [facility]: [facility];  Service: Ophthalmology;  Laterality: Right;  CDE: 10.31   CATARACT EXTRACTION W/PHACO Left 05/27/2019   Procedure: CATARACT EXTRACTION PHACO AND INTRAOCULAR LENS PLACEMENT (IOC);  Surgeon: Harrie Agent, MD;  [facility]: [facility];  Service: Ophthalmology;  Laterality: Left;  CDE: 6.49   COLONOSCOPY     times two 2002 ,2007   COLONOSCOPY N/A 10/07/2013   Procedure: COLONOSCOPY;  Surgeon: Lamar HERO Shaaron, MD;  [facility];  Service: Endoscopy;  Laterality: N/A;  8:30 AM   COLONOSCOPY N/A 10/05/2018   Dr. Shaaron: Diverticulosis.  Next colonoscopy 5 years due to first-degree relative with colon cancer   COLONOSCOPY WITH PROPOFOL  N/A 09/27/2023   Procedure: COLONOSCOPY WITH PROPOFOL ;  Surgeon: Shaaron Lamar HERO, MD;  [facility];  Service: Endoscopy;  Laterality: N/A;  12:30pm, asa 3   ESOPHAGOGASTRODUODENOSCOPY (EGD) WITH PROPOFOL  N/A 05/25/2020   [name]: 2 gastric ulcers.  No H. pylori.  Likely NSAID related.  Repeat EGD in 3 months   ESOPHAGOGASTRODUODENOSCOPY (EGD) WITH PROPOFOL  N/A 09/17/2020   Procedure: ESOPHAGOGASTRODUODENOSCOPY (EGD) WITH PROPOFOL ;  Surgeon: Shaaron Lamar HERO, MD;  [facility];  Service: Endoscopy;  Laterality: N/A;  10:00am   ESOPHAGOGASTRODUODENOSCOPY (EGD) WITH PROPOFOL  N/A 03/25/2022   Procedure: ESOPHAGOGASTRODUODENOSCOPY (EGD) WITH PROPOFOL ;  Surgeon: Eartha Angelia Sieving, MD;  [facility];  Service: Gastroenterology;  Laterality: N/A;   ESOPHAGOGASTRODUODENOSCOPY (EGD) WITH PROPOFOL  N/A 06/15/2022   Procedure: ESOPHAGOGASTRODUODENOSCOPY (EGD) WITH PROPOFOL ;  Surgeon: Shaaron Lamar HERO, MD;   [facility];  Service: Endoscopy;  Laterality: N/A;  7:30am   JOINT REPLACEMENT     KNEE ARTHROPLASTY Left 06/27/2019   Procedure: COMPUTER ASSISTED TOTAL KNEE ARTHROPLASTY;  Surgeon: Fidel Rogue, MD;  Location: [facility];  Service: Orthopedics;  Laterality: Left;   KNEE ARTHROPLASTY Right 08/15/2019   Procedure: COMPUTER ASSISTED TOTAL KNEE ARTHROPLASTY;  Surgeon: Fidel Rogue, MD;  Location: [facility];  Service: Orthopedics;  Laterality: Right;   LEG SURGERY Right early 80's   POLYPECTOMY  09/27/2023   Procedure: POLYPECTOMY;  Surgeon: Shaaron Lamar HERO, MD;  [facility];  Service: Endoscopy;;   Family History  Problem Relation Age of Onset   Colon cancer Mother        She died from her malignancy, 70   Lung cancer Father        Died from lung cancer, age 9   Emphysema Brother        Died at 31 from lung disease   COPD Brother    Dementia Sister    Healthy Son    Healthy Daughter    Social History   Socioeconomic History   Marital status: Married    Spouse name: Not on file   Number of children: 2   Years of education: Not on file   Highest education level: 10th grade  Occupational History   Occupation: Truck Hospital Doctor    Comment: runs a designer, television/film set as well, Public Librarian in [place]  Tobacco Use   Smoking status: Never   Smokeless tobacco: Never  Vaping Use   Vaping status: Never Used  Substance and Sexual Activity   Alcohol  use: Yes    Comment: occasionally   Drug use: No   Sexual activity: Yes  Other Topics Concern   Not on file  Social History Narrative   Daughter is 46, Son is 89.  3 grandchildren. Married for 34 years   Social Drivers of Corporate Investment Banker Strain: Low Risk  (10/19/2023)   Overall Financial Resource Strain (CARDIA)    Difficulty of Paying Living Expenses: Not very hard  Food Insecurity: No Food Insecurity (10/19/2023)   Hunger Vital Sign    Worried About Running Out of Food in the Last Year: Never true    Ran Out of  Food in the Last Year: Never true  Transportation Needs: No Transportation Needs (10/19/2023)   PRAPARE - Administrator, Civil Service (Medical): No    Lack of Transportation (Non-Medical): No  Physical Activity: Unknown (10/19/2023)   Exercise Vital Sign    Days of Exercise per Week: Patient declined    Minutes of Exercise per Session: 0 min  Stress: No Stress Concern Present (10/19/2023)   [employer] of Occupational Health - Occupational Stress Questionnaire    Feeling of Stress : Not at all  Social Connections: Unknown (10/19/2023)   Social Connection and Isolation Panel [NHANES]    Frequency of Communication with Friends and Family: Three times a week    Frequency of Social Gatherings with Friends and Family: Once a week    Attends Religious Services: Patient declined    Database Administrator or Organizations: No    Attends Engineer, Structural: Not on file    Marital Status: Married    Tobacco Counseling Counseling given: Not Answered   Clinical Intake: He is alc/o of mole on left calf for the last 3-weeks that has not change color or size  Activities of Daily Living    11/19/2023    9:04 PM 09/22/2023    1:32 PM  In your present state of health, do you have any difficulty performing the following activities:  Hearing? 0 0  Vision? 0 0  Difficulty concentrating or making decisions? 0 0  Walking or climbing stairs? 0   Dressing or bathing? 0   Doing errands, shopping? 0   Preparing Food and eating ? N   Using the Toilet? N   In the past six months, have you accidently leaked urine? N   Do you have problems with loss of bowel control? N   Managing your Medications? N  Managing your Finances? N   Housekeeping or managing your Housekeeping? N     Patient Care Team: Deitra Morton Hummer, Nena, NP as PCP - General (Nurse Practitioner) Shaaron Lamar HERO, MD as Consulting Physician (Gastroenterology)  Indicate any recent Medical Services you may have  received from other than [facility] providers in the past year (date may be approximate).     Assessment:   This is a routine wellness examination for [place].  Hearing/Vision screen No results found.   Goals Addressed   None    Depression Screen    07/19/2023    4:01 PM  PHQ 2/9 Scores  PHQ - 2 Score 0  PHQ- 9 Score 2    Fall Risk    11/23/2023    9:27 AM 11/19/2023    9:04 PM 07/19/2023    3:05 PM  Fall Risk   Falls in the past year? 0 0 0  Number falls in past yr: 0  0  Injury with Fall? 0  0  Risk for fall due to : No Fall Risks  No Fall Risks  Follow up Falls evaluation completed      MEDICARE RISK AT HOME: Medicare Risk at Home Any stairs in or around the home?: Yes If so, are there any without handrails?: No Home free of loose throw rugs in walkways, pet beds, electrical cords, etc?: Yes Adequate lighting in your home to reduce risk of falls?: Yes Life alert?: No Use of a cane, walker or w/c?: No Grab bars in the bathroom?: No Shower chair or bench in shower?: No Elevated toilet seat or a handicapped toilet?: No  TIMED UP AND GO:  Was the test performed? Yes  Length of time to ambulate 10 feet: 3 sec Gait steady and fast with assistive device    Cognitive Function:    11/23/2023    9:28 AM  MMSE - Mini Mental State Exam  Orientation to time 5  Orientation to Place 5  Registration 3  Recall 3  Language- name 2 objects 2  Language- repeat 1  Language- follow 3 step command 3  Language- read & follow direction 1  Write a sentence 1  Copy design 1        11/23/2023    9:32 AM  6CIT Screen  What Year? 4 points  What month? 3 points  What time? 3 points  Count back from 20 4 points  Months in reverse 4 points  Repeat phrase 10 points  Total Score 28 points    Immunizations Immunization History  Administered Date(s) Administered   Fluad Trivalent(High Dose 65+) 07/19/2023   Influenza Split 07/31/2013   Influenza, Quadrivalent, Recombinant, Inj, Pf  07/19/2019   Influenza, Seasonal, Injecte, Preservative Fre 07/24/2014   Influenza,inj,quad, With Preservative 06/25/2015, 07/21/2016, 07/27/2017, 07/12/2018, 07/18/2019   Influenza-Unspecified 09/13/2014   Moderna Sars-Covid-2 Vaccination 10/28/2020   PFIZER(Purple Top)SARS-COV-2 Vaccination 01/16/2020, 02/07/2020   PNEUMOCOCCAL CONJUGATE-20 07/19/2023   Pneumococcal Conjugate-13 09/17/2014   Tdap 09/26/2015   Zoster Recombinant(Shingrix ) 07/19/2023, 10/24/2023    TDAP status: Up to date  Flu Vaccine status: Up to date  Pneumococcal vaccine status: Up to date  Covid-19 vaccine status: Declined, Education has been provided regarding the importance of this vaccine but patient still declined. Advised may receive this vaccine at local pharmacy or Health Dept.or vaccine clinic. Aware to provide a copy of the vaccination record if obtained from local pharmacy or Health Dept. Verbalized acceptance and understanding.  Qualifies for Shingles Vaccine? Yes  Zostavax completed No   Shingrix  Completed?: Yes  Screening Tests Health Maintenance  Topic Date Due   COVID-19 Vaccine (4 - 2024-25 season) 12/09/2023 (Originally 06/18/2023)   Hepatitis C Screening  07/18/2024 (Originally 02/12/1974)   HEMOGLOBIN A1C  04/22/2024   OPHTHALMOLOGY EXAM  05/30/2024   FOOT EXAM  07/18/2024   Diabetic kidney evaluation - Urine ACR  10/23/2024   Diabetic kidney evaluation - eGFR measurement  11/21/2024   Medicare Annual Wellness (AWV)  11/22/2024   DTaP/Tdap/Td (2 - Td or Tdap) 09/25/2025   Colonoscopy  09/26/2033   Pneumonia Vaccine 60+ Years old  Completed   INFLUENZA VACCINE  Completed   HIV Screening  Completed   Zoster Vaccines- Shingrix   Completed   HPV VACCINES  Aged Out    Health Maintenance  There are no preventive care reminders to display for this patient.   Colorectal cancer screening: Type of screening: Colonoscopy. Completed 09/27/2023. Repeat every 5 years  Lung Cancer Screening:  (Low Dose CT Chest recommended if Age 52-80 years, 20 pack-year currently smoking OR have quit w/in 15years.) does not qualify.   Lung Cancer Screening Referral: none need not a smoker  Additional Screening:  Hepatitis C Screening: does not qualify; Completed   Vision Screening: Recommended annual ophthalmology exams for early detection of glaucoma and other disorders of the eye. Is the patient up to date with their annual eye exam?  Yes  Who is the provider or what is the name of the office in which the patient attends annual eye exams? [facility] in [place], [place] If pt is not established with a provider, would they like to be referred to a provider to establish care? No .   Dental Screening: Recommended annual dental exams for proper oral hygiene  Diabetic Foot Exam: Diabetic Foot Exam: Completed 07/19/2023  Community Resource Referral / Chronic Care Management: CRR required this visit?  No   CCM required this visit?  No    Plan:    Skin Abrasion: will prescribed Mupirocin  ointment and client to monitor area infection  I have personally reviewed and noted the following in the patient's chart:   Medical and social history Use of alcohol , tobacco or illicit drugs  Current medications and supplements including opioid prescriptions. Patient is not currently taking opioid prescriptions. Functional ability and status Nutritional status Physical activity Advanced directives List of other physicians Hospitalizations, surgeries, and ER visits in previous 12 months Vitals Screenings to include cognitive, depression, and falls Referrals and appointments  In addition, I have reviewed and discussed with patient certain preventive protocols, quality metrics, and best practice recommendations. A written personalized care plan for preventive services as well as general preventive health recommendations were provided to patient.     [address] [place], [place]   11/23/2023   After Visit  Summary: (In Person-Printed) AVS printed and given to the patient

## 2023-11-24 MED ORDER — LEVOTHYROXINE SODIUM 75 MCG PO TABS: 75.0000 ug | ORAL_TABLET | Freq: Every day | ORAL | 0 refills | Status: DC

## 2023-11-24 NOTE — Telephone Encounter (Signed)
 Refill w/ ok to change manufacturer failed. resent

## 2023-11-24 NOTE — Addendum Note (Signed)
 Addended by: Danisha Brassfield D on: 11/24/2023 09:16 AM   Modules accepted: Orders

## 2023-11-29 ENCOUNTER — Inpatient Hospital Stay: Payer: Self-pay | Admitting: Physician Assistant

## 2023-11-30 ENCOUNTER — Other Ambulatory Visit: Payer: PPO

## 2023-11-30 ENCOUNTER — Encounter (HOSPITAL_COMMUNITY): Payer: Self-pay | Admitting: Hematology

## 2023-11-30 DIAGNOSIS — E211 Secondary hyperparathyroidism, not elsewhere classified: Secondary | ICD-10-CM | POA: Diagnosis not present

## 2023-11-30 DIAGNOSIS — D631 Anemia in chronic kidney disease: Secondary | ICD-10-CM | POA: Diagnosis not present

## 2023-11-30 DIAGNOSIS — R809 Proteinuria, unspecified: Secondary | ICD-10-CM | POA: Diagnosis not present

## 2023-11-30 DIAGNOSIS — N189 Chronic kidney disease, unspecified: Secondary | ICD-10-CM | POA: Diagnosis not present

## 2023-12-05 NOTE — Progress Notes (Unsigned)
Virtual Visit via Telephone Note  I connected with Bryan Atkinson on 12/05/23 at  9:00 AM EST by telephone and verified that I am speaking with the correct person using two identifiers.  Location: Patient: Home  Provider: Home office   I discussed the limitations, risks, security and privacy concerns of performing an evaluation and management service by telephone and the availability of in person appointments. I also discussed with the patient that there may be a patient responsible charge related to this service. The patient expressed understanding and agreed to proceed.   History of Present Illness: Patient is a 68 year old male who returns to clinic today for iron deficiency anemia CKD stage IV.  He is currently taking iron tablets daily with intermittent IV iron.  He was last evaluated in clinic on 05/26/2023 virtually.   In the interim, he had a colonoscopy by Dr. Jena Gauss on 09/27/2023 which showed an 8 mm polyp in transverse colon.  Pathology showed tubular adenoma but negative for high-grade dysplasia or malignancy.   He last received IV iron in August 2023.  He denies any bleeding, bright red blood per rectum, melena or hematochezia.  Overall, he is doing well ***.    Observations/Objective:ROS  Physical Exam Neurological:     Mental Status: He is alert and oriented to person, place, and time.     Assessment and Plan: 1. 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  He is currently taking oral iron supplements 325 mg ferrous sulfate. -Lab work from 11/22/2023 shows a hemoglobin of 13.7, ferritin 284, iron saturations 25% within normal TIBC.  Differential is unremarkable.  CMP reveals stable CKD with creatinine 2.17 -He does not need any additional IV iron at this time.  Continue oral iron supplements. -Most recent colonoscopy from December 2024 revealed one 8 mm tubular adenoma polyp in the  transverse colon.  Otherwise, colonoscopy unremarkable.  No evidence of bleeding.   Follow Up Instructions: Return to clinic in 6 months with labs a few days before.   I discussed the assessment and treatment plan with the patient. The patient was provided an opportunity to ask questions and all were answered. The patient agreed with the plan and demonstrated an understanding of the instructions.   The patient was advised to call back or seek an in-person evaluation if the symptoms worsen or if the condition fails to improve as anticipated.  I provided *** minutes of non-face-to-face time during this encounter.   Mauro Kaufmann, NP

## 2023-12-06 ENCOUNTER — Inpatient Hospital Stay (HOSPITAL_BASED_OUTPATIENT_CLINIC_OR_DEPARTMENT_OTHER): Payer: PPO | Admitting: Oncology

## 2023-12-06 DIAGNOSIS — D509 Iron deficiency anemia, unspecified: Secondary | ICD-10-CM | POA: Diagnosis not present

## 2023-12-06 DIAGNOSIS — D631 Anemia in chronic kidney disease: Secondary | ICD-10-CM | POA: Diagnosis not present

## 2023-12-06 DIAGNOSIS — N1832 Chronic kidney disease, stage 3b: Secondary | ICD-10-CM | POA: Diagnosis not present

## 2023-12-07 DIAGNOSIS — N1832 Chronic kidney disease, stage 3b: Secondary | ICD-10-CM | POA: Diagnosis not present

## 2023-12-07 DIAGNOSIS — E1122 Type 2 diabetes mellitus with diabetic chronic kidney disease: Secondary | ICD-10-CM | POA: Diagnosis not present

## 2023-12-07 DIAGNOSIS — I129 Hypertensive chronic kidney disease with stage 1 through stage 4 chronic kidney disease, or unspecified chronic kidney disease: Secondary | ICD-10-CM | POA: Diagnosis not present

## 2023-12-07 DIAGNOSIS — I5032 Chronic diastolic (congestive) heart failure: Secondary | ICD-10-CM | POA: Diagnosis not present

## 2023-12-13 ENCOUNTER — Telehealth: Payer: Self-pay

## 2023-12-13 ENCOUNTER — Other Ambulatory Visit: Payer: Self-pay | Admitting: Nurse Practitioner

## 2023-12-13 DIAGNOSIS — D649 Anemia, unspecified: Secondary | ICD-10-CM

## 2023-12-13 NOTE — Telephone Encounter (Deleted)
 Copied from CRM [id number]. Topic: Clinical - Medication Refill >> Dec 13, 2023 12:51 PM Antony Haste wrote: Most Recent Primary Care Visit:  Provider: [facility]  Department: [facility]  Visit Type: LAB VISIT  Date: 11/30/2023  Medication: fenofibrate (TRICOR) 145 MG tablet  Has the patient contacted their pharmacy? Yes (Agent: If no, request that the patient contact the pharmacy for the refill. If patient does not wish to contact the pharmacy document the reason why and proceed with request.) (Agent: If yes, when and what did the pharmacy advise?)  Is this the correct pharmacy for this prescription? Yes If no, delete pharmacy and type the correct one.  This is the patient's preferred pharmacy:  [id number] - [place], [place] - [address] [place] [address] Phone: [phone] Fax: [phone]   Has the prescription been filled recently? No  Is the patient out of the medication? Yes  Has the patient been seen for an appointment in the last year OR does the patient have an upcoming appointment? Yes  Can we respond through MyChart? Yes  Agent: Please be advised that Rx refills may take up to 3 business days. We ask that you follow-up with your pharmacy.

## 2023-12-13 NOTE — Telephone Encounter (Signed)
 Copied from CRM [id number]. Topic: Clinical - Prescription Issue >> Dec 13, 2023 12:52 PM Antony Haste wrote: Reason for CRM: PT is requesting fenofibrate 145 MG tablet refill [facility], received an error of: You need to update "Anemia, unspecified anemia type" due to a system change. Orders not placed. Since you have not selected an appropriate replacement for a clinically inactive diagnosis, no orders will be placed now." Unable to complete request. Callback [phone]

## 2023-12-14 MED ORDER — FENOFIBRATE 145 MG PO TABS: 145.0000 mg | ORAL_TABLET | Freq: Every day | ORAL | 1 refills | Status: DC

## 2023-12-14 NOTE — Telephone Encounter (Signed)
Rx sent, pt aware 

## 2023-12-14 NOTE — Addendum Note (Signed)
 Addended by: Daisy Blossom on: 12/14/2023 08:56 AM   Modules accepted: Orders

## 2024-01-16 ENCOUNTER — Other Ambulatory Visit: Payer: Self-pay | Admitting: Nurse Practitioner

## 2024-01-18 NOTE — Progress Notes (Deleted)
 Established Patient Office Visit  Subjective  Patient ID: Bryan Atkinson, male    DOB: [birth year]  Age: 68 y.o. MRN: [id number]  No chief complaint on file.   HPI  Patient Active Problem List   Diagnosis Date Noted   Welcome to Medicare preventive visit 11/23/2023   Atypical mole 11/23/2023   Chronic kidney disease, stage 4 (severe) (HCC) 10/24/2023   Obesity, morbid (HCC) 10/24/2023   Immunization due 10/24/2023   Abnormal serum thyroid stimulating hormone (TSH) level 07/20/2023   Gout 07/19/2023   Screening PSA (prostate specific antigen) 07/19/2023   Routine medical exam 07/19/2023   Encounter for general adult medical examination with abnormal findings 07/19/2023   Obesity with body mass index 30 or greater 07/19/2023   Paronychia of toe 07/19/2023   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   Gastrointestinal hemorrhage    Acute on chronic blood loss anemia 03/24/2022   Type 2 diabetes with nephropathy (HCC) 03/24/2022   Essential hypertension 03/24/2022   Acute renal failure superimposed on stage 3b chronic kidney disease (HCC) 03/24/2022   IDA (iron deficiency anemia) 09/22/2021   Constipation 09/22/2021   Lumbar radiculopathy 06/16/2021   GERD (gastroesophageal reflux disease) 12/16/2020   Spinal stenosis of lumbar region 08/26/2020   Gastric ulcer 08/25/2020   Scoliosis deformity of spine 07/01/2020   Degeneration of lumbar intervertebral disc 07/01/2020   Hyperlipidemia 06/17/2020   Symptomatic anemia 05/20/2020   Poor appetite 05/20/2020   Abdominal pain 05/20/2020   Nausea without vomiting 05/20/2020   Osteoarthritis of right knee 08/15/2019   Osteoarthritis of left knee 06/27/2019   Past Medical History:  Diagnosis Date   Anemia    Arthritis    Diabetes mellitus without complication (HCC)    GERD (gastroesophageal reflux disease)    Gout    History of kidney stones    Hyperlipidemia    Hypertension    Past Surgical History:  Procedure Laterality Date    BIOPSY  05/25/2020   Procedure: BIOPSY;  Surgeon: Corbin Ade, MD;  [facility]: [facility];  Service: Endoscopy;;   BIOPSY  03/25/2022   Procedure: BIOPSY;  Surgeon: Dolores Frame, MD;  Location: [facility];  Service: Gastroenterology;;   BIOPSY  06/15/2022   Procedure: BIOPSY;  Surgeon: Corbin Ade, MD;  [facility]: [facility];  Service: Endoscopy;;   CATARACT EXTRACTION W/PHACO Right 05/13/2019   Procedure: CATARACT EXTRACTION PHACO AND INTRAOCULAR LENS PLACEMENT (IOC);  Surgeon: Fabio Pierce, MD;  Location: [facility];  Service: Ophthalmology;  Laterality: Right;  CDE: 10.31   CATARACT EXTRACTION W/PHACO Left 05/27/2019   Procedure: CATARACT EXTRACTION PHACO AND INTRAOCULAR LENS PLACEMENT (IOC);  Surgeon: Fabio Pierce, MD;  Location: [facility];  Service: Ophthalmology;  Laterality: Left;  CDE: 6.49   COLONOSCOPY     times two 2002 ,2007   COLONOSCOPY N/A 10/07/2013   Procedure: COLONOSCOPY;  Surgeon: Corbin Ade, MD;  [facility]: [facility];  Service: Endoscopy;  Laterality: N/A;  8:30 AM   COLONOSCOPY N/A 10/05/2018   Dr. Jena Gauss: Diverticulosis.  Next colonoscopy 5 years due to first-degree relative with colon cancer   COLONOSCOPY WITH PROPOFOL N/A 09/27/2023   Procedure: COLONOSCOPY WITH PROPOFOL;  Surgeon: Corbin Ade, MD;  [facility]: [facility];  Service: Endoscopy;  Laterality: N/A;  12:30pm, asa 3   ESOPHAGOGASTRODUODENOSCOPY (EGD) WITH PROPOFOL N/A 05/25/2020   [name]: 2 gastric ulcers.  No H. pylori.  Likely NSAID related.  Repeat EGD in 3 months   ESOPHAGOGASTRODUODENOSCOPY (  EGD) WITH PROPOFOL N/A 09/17/2020   Procedure: ESOPHAGOGASTRODUODENOSCOPY (EGD) WITH PROPOFOL;  Surgeon: Corbin Ade, MD;  [facility];  Service: Endoscopy;  Laterality: N/A;  10:00am   ESOPHAGOGASTRODUODENOSCOPY (EGD) WITH PROPOFOL N/A 03/25/2022   Procedure: ESOPHAGOGASTRODUODENOSCOPY (EGD) WITH PROPOFOL;  Surgeon: Dolores Frame, MD;  [facility];   Service: Gastroenterology;  Laterality: N/A;   ESOPHAGOGASTRODUODENOSCOPY (EGD) WITH PROPOFOL N/A 06/15/2022   Procedure: ESOPHAGOGASTRODUODENOSCOPY (EGD) WITH PROPOFOL;  Surgeon: Corbin Ade, MD;  [facility];  Service: Endoscopy;  Laterality: N/A;  7:30am   JOINT REPLACEMENT     KNEE ARTHROPLASTY Left 06/27/2019   Procedure: COMPUTER ASSISTED TOTAL KNEE ARTHROPLASTY;  Surgeon: Samson Frederic, MD;  Location: [facility];  Service: Orthopedics;  Laterality: Left;   KNEE ARTHROPLASTY Right 08/15/2019   Procedure: COMPUTER ASSISTED TOTAL KNEE ARTHROPLASTY;  Surgeon: Samson Frederic, MD;  Location: [facility];  Service: Orthopedics;  Laterality: Right;   LEG SURGERY Right early 80's   POLYPECTOMY  09/27/2023   Procedure: POLYPECTOMY;  Surgeon: Corbin Ade, MD;  [facility];  Service: Endoscopy;;   Social History   Tobacco Use   Smoking status: Never   Smokeless tobacco: Never  Vaping Use   Vaping status: Never Used  Substance Use Topics   Alcohol use: Yes    Comment: occasionally   Drug use: No   Social History   Socioeconomic History   Marital status: Married    Spouse name: Not on file   Number of children: 2   Years of education: Not on file   Highest education level: 10th grade  Occupational History   Occupation: Truck [employer] doctor    Comment: runs a Designer, television/film set as well, [employer] in [place]  Tobacco Use   Smoking status: Never   Smokeless tobacco: Never  Vaping Use   Vaping status: Never Used  Substance and Sexual Activity   Alcohol use: Yes    Comment: occasionally   Drug use: No   Sexual activity: Yes  Other Topics Concern   Not on file  Social History Narrative   Daughter is 11, Son is 66.  3 grandchildren. Married for 34 years   Social Drivers of Corporate investment banker Strain: Low Risk  (10/19/2023)   Overall Financial Resource Strain (CARDIA)    Difficulty of Paying Living Expenses: Not very hard  Food Insecurity: No Food Insecurity  (10/19/2023)   Hunger Vital Sign    Worried About Running Out of Food in the Last Year: Never true    Ran Out of Food in the Last Year: Never true  Transportation Needs: No Transportation Needs (10/19/2023)   PRAPARE - Administrator, Civil Service (Medical): No    Lack of Transportation (Non-Medical): No  Physical Activity: Unknown (10/19/2023)   Exercise Vital Sign    Days of Exercise per Week: Patient declined    Minutes of Exercise per Session: 0 min  Stress: No Stress Concern Present (10/19/2023)   [employer] of Occupational Health - Occupational Stress Questionnaire    Feeling of Stress : Not at all  Social Connections: Unknown (10/19/2023)   Social Connection and Isolation Panel [NHANES]    Frequency of Communication with Friends and Family: Three times a week    Frequency of Social Gatherings with Friends and Family: Once a week    Attends Religious Services: Patient declined    Active Member of Clubs or Organizations: No    Attends  Engineer, structural: Not on file    Marital Status: Married  Catering manager Violence: Not on file   Family Status  Relation Name Status   Mother  Deceased   Father  Deceased   Brother  Deceased   Sister  Alive   MGM  Deceased   MGF  Deceased   PGM  Deceased   PGF  Deceased   Son  Alive   Daughter  Alive  No partnership data on file   Family History  Problem Relation Age of Onset   Colon cancer Mother        She died from her malignancy, 55   Lung cancer Father        Died from lung cancer, age 3   Emphysema Brother        Died at 31 from lung disease   COPD Brother    Dementia Sister    Healthy Son    Healthy Daughter    No Known Allergies    ROS Negative unless indicated in HPI   Objective:     There were no vitals taken for this visit. BP Readings from Last 3 Encounters:  11/23/23 118/72  10/24/23 103/78  09/27/23 117/79   Wt Readings from Last 3 Encounters:  11/23/23 244 lb (110.7 kg)   10/24/23 249 lb (112.9 kg)  09/27/23 251 lb 15.8 oz (114.3 kg)      Physical Exam   No results found for any visits on 01/22/24.  Last CBC Lab Results  Component Value Date   WBC 8.6 11/22/2023   HGB 13.7 11/22/2023   HCT 40.6 11/22/2023   MCV 93.3 11/22/2023   MCH 31.5 11/22/2023   RDW 13.6 11/22/2023   PLT 315 11/22/2023   Last metabolic panel Lab Results  Component Value Date   GLUCOSE 149 (H) 11/22/2023   NA 136 11/22/2023   K 4.4 11/22/2023   CL 101 11/22/2023   CO2 23 11/22/2023   BUN 43 (H) 11/22/2023   CREATININE 2.17 (H) 11/22/2023   GFRNONAA 33 (L) 11/22/2023   CALCIUM 10.2 11/22/2023   PHOS 3.0 04/04/2023   PROT 7.3 11/11/2022   ALBUMIN 3.8 04/04/2023   LABGLOB 3.5 06/01/2020   AGRATIO 0.9 06/01/2020   BILITOT 0.5 11/11/2022   ALKPHOS 48 11/11/2022   AST 24 11/11/2022   ALT 20 11/11/2022   ANIONGAP 12 11/22/2023   Last lipids Lab Results  Component Value Date   CHOL 172 07/19/2023   HDL 38 (L) 07/19/2023   LDLCALC 84 07/19/2023   TRIG 301 (H) 07/19/2023   CHOLHDL 4.5 07/19/2023   Last hemoglobin A1c Lab Results  Component Value Date   HGBA1C 6.0 (H) 10/24/2023   Last thyroid functions Lab Results  Component Value Date   TSH 2.330 08/28/2023   T4TOTAL 8.9 08/03/2023        Assessment & Plan:  There are no diagnoses linked to this encounter. Continue healthy lifestyle choices, including diet (rich in fruits, vegetables, and lean proteins, and low in salt and simple carbohydrates) and exercise (at least 30 minutes of moderate physical activity daily).     The above assessment and management plan was discussed with the patient. The patient verbalized understanding of and has agreed to the management plan. Patient is aware to call the clinic if they develop any new symptoms or if symptoms persist or worsen. Patient is aware when to return to the clinic for a follow-up visit. Patient educated on when it  is appropriate to go to the  emergency department.  No follow-ups on file.    Arrie Aran Santa Lighter, [facility] [address] [place], [place] [address] [phone]    Note: This document was prepared by [employer] voice dictation technology and any errors that results from this process are unintentional.

## 2024-01-22 ENCOUNTER — Ambulatory Visit (INDEPENDENT_AMBULATORY_CARE_PROVIDER_SITE_OTHER): Payer: Medicare HMO | Admitting: Nurse Practitioner

## 2024-01-24 ENCOUNTER — Ambulatory Visit: Admitting: Nurse Practitioner

## 2024-01-24 ENCOUNTER — Encounter: Payer: Self-pay | Admitting: Nurse Practitioner

## 2024-01-24 VITALS — BP 122/74 | HR 83 | Temp 97.5°F | Ht 69.0 in | Wt 253.8 lb

## 2024-01-24 DIAGNOSIS — K219 Gastro-esophageal reflux disease without esophagitis: Secondary | ICD-10-CM

## 2024-01-24 DIAGNOSIS — N184 Chronic kidney disease, stage 4 (severe): Secondary | ICD-10-CM | POA: Diagnosis not present

## 2024-01-24 DIAGNOSIS — E1121 Type 2 diabetes mellitus with diabetic nephropathy: Secondary | ICD-10-CM | POA: Diagnosis not present

## 2024-01-24 DIAGNOSIS — J310 Chronic rhinitis: Secondary | ICD-10-CM

## 2024-01-24 DIAGNOSIS — E782 Mixed hyperlipidemia: Secondary | ICD-10-CM

## 2024-01-24 DIAGNOSIS — M5416 Radiculopathy, lumbar region: Secondary | ICD-10-CM | POA: Diagnosis not present

## 2024-01-24 DIAGNOSIS — I10 Essential (primary) hypertension: Secondary | ICD-10-CM

## 2024-01-24 MED ORDER — MONTELUKAST SODIUM 10 MG PO TABS: 10.0000 mg | ORAL_TABLET | Freq: Every day | ORAL | 0 refills | Status: DC

## 2024-01-24 NOTE — Progress Notes (Signed)
 Established Patient Office Visit  Subjective  Patient ID: Bryan Atkinson, male    DOB: 02/27/1956  Age: 68 y.o. MRN: [id number]  Chief Complaint  Patient presents with   Medical Management of Chronic Issues    3 month   Hip Pain    Lower back down to hip and leg pain for 1 month    Bryan Atkinson is a 68 yrs old male present for 70-months f/u for chronic diseases management and concerns for hip pain and cough in the morning Mixed Rhinitis: Reports increase episodes of Coughing in morning with the weather changes. Current not on any antihistamine. He is receptive to trying Singulair   Lumbar Radiculopathy Hip Pain   A 68 year old male presents with a 19-month history of hip pain, which he rates as 3/10 in severity. The patient has a history of lumbar radiculopathy and is currently under the care of an orthopedist, who recommended surgery to increase spacing between the discs. He is unsure if he should contact the orthopedist for further consultation. CKD stage 4 The patient also has a history of chronic kidney disease (CKD) stage 4 and is currently being managed by nephrology. He reports no new changes in his condition and continues to take all prescribed medications as directed. He denies any additional symptoms or concerns at this time.  GERD, Follow up: The patient was last seen for GERD 3 months ago. Changes made since that visit include pantoprazole 40 mg daily. He reports excellent compliance with treatment. He is not having side effects. Marland Kitchen He IS experiencing cough. He is NOT experiencing chest pain or need to clear throat frequently plan to continue pantoprazole 40 mg daily  Lipid/Cholesterol, Follow-up  Last lipid panel Other pertinent labs  Lab Results  Component Value Date   CHOL 172 07/19/2023   HDL 38 (L) 07/19/2023   LDLCALC 84 07/19/2023   TRIG 301 (H) 07/19/2023   CHOLHDL 4.5 07/19/2023   Lab Results  Component Value Date   ALT 20 11/11/2022   AST  24 11/11/2022   PLT 315 11/22/2023   TSH 2.330 08/28/2023     He was last seen for this 3 months ago.  Management since that visit includes pravastatin 40 mg daily.  He reports excellent compliance with treatment. He is not having side effects.  Symptoms: No chest pain No chest pressure/discomfort  No dyspnea No lower extremity edema  No numbness or tingling of extremity No orthopnea  No palpitations No paroxysmal nocturnal dyspnea  No speech difficulty No syncope   Current diet: not asked Current exercise: none  The 10-year ASCVD risk score (Arnett DK, et al., 2019) is: 25.9%  Hypertension, follow-up  BP Readings from Last 3 Encounters:  01/24/24 122/74  11/23/23 118/72  10/24/23 103/78   Wt Readings from Last 3 Encounters:  01/24/24 253 lb 12.8 oz (115.1 kg)  11/23/23 244 lb (110.7 kg)  10/24/23 249 lb (112.9 kg)     He was last seen for hypertension 3 months ago.  BP at that visit was 118/72. Management since that visit includes losartan 12.5 mg, hydralazine 50 mg BID, amlodipine 2.5 mg daily. He reports excellent compliance with treatment. He is not having side effects.  He is following a Regular diet. He is not exercising. He does smoke. Use of agents associated with hypertension: none.  Outside blood pressures are not being monitored. Symptoms: No chest pain No chest pressure  No palpitations No syncope  No dyspnea No orthopnea  No paroxysmal nocturnal dyspnea No lower extremity edema   Pertinent labs Lab Results  Component Value Date   CHOL 172 07/19/2023   HDL 38 (L) 07/19/2023   LDLCALC 84 07/19/2023   TRIG 301 (H) 07/19/2023   CHOLHDL 4.5 07/19/2023   Lab Results  Component Value Date   NA 136 11/22/2023   K 4.4 11/22/2023   CREATININE 2.17 (H) 11/22/2023   GFRNONAA 33 (L) 11/22/2023   GLUCOSE 149 (H) 11/22/2023   TSH 2.330 08/28/2023     The 10-year ASCVD risk score (Arnett DK, et al., 2019) is: 25.9% Plan to continue all previously  prescribed losartan 12.5 mg, hydralazine 50 mg BID, amlodipine 2.5 mg daily.  Diabetes Mellitus Type II, Follow-up  Lab Results  Component Value Date   HGBA1C 6.0 (H) 10/24/2023   HGBA1C 6.3 (H) 07/19/2023   HGBA1C 5.3 03/24/2022   Wt Readings from Last 3 Encounters:  01/24/24 253 lb 12.8 oz (115.1 kg)  11/23/23 244 lb (110.7 kg)  10/24/23 249 lb (112.9 kg)   Last seen for diabetes 3 months ago.  Management since then includes Farxiga 5 mg daily. He reports excellent compliance with treatment. He is not having side effects.  Symptoms: No fatigue No foot ulcerations  No appetite changes No nausea  No paresthesia of the feet  No polydipsia  No polyuria No visual disturbances   No vomiting   Home blood sugar records: fasting range: 115 Episodes of hypoglycemia? No   Current insulin regiment: Not on insulin Most Recent Eye Exam: within the last yr Current exercise: none Current diet habits: not asked  Pertinent Labs: Lab Results  Component Value Date   CHOL 172 07/19/2023   HDL 38 (L) 07/19/2023   LDLCALC 84 07/19/2023   TRIG 301 (H) 07/19/2023   CHOLHDL 4.5 07/19/2023   Lab Results  Component Value Date   NA 136 11/22/2023   K 4.4 11/22/2023   CREATININE 2.17 (H) 11/22/2023   GFRNONAA 33 (L) 11/22/2023   MICRALBCREAT 143 (H) 10/24/2023     ---------------------------------------------------------------------------------------------------  Patient Active Problem List   Diagnosis Date Noted   Welcome to Medicare preventive visit 11/23/2023   Atypical mole 11/23/2023   Chronic kidney disease, stage 4 (severe) (HCC) 10/24/2023   Obesity, morbid (HCC) 10/24/2023   Immunization due 10/24/2023   Abnormal serum thyroid stimulating hormone (TSH) level 07/20/2023   Gout 07/19/2023   Screening PSA (prostate specific antigen) 07/19/2023   Routine medical exam 07/19/2023   Encounter for general adult medical examination with abnormal findings 07/19/2023   Obesity with  body mass index 30 or greater 07/19/2023   Paronychia of toe 07/19/2023   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   Gastrointestinal hemorrhage    Acute on chronic blood loss anemia 03/24/2022   Type 2 diabetes with nephropathy (HCC) 03/24/2022   Essential hypertension 03/24/2022   Acute renal failure superimposed on stage 3b chronic kidney disease (HCC) 03/24/2022   IDA (iron deficiency anemia) 09/22/2021   Constipation 09/22/2021   Lumbar radiculopathy 06/16/2021   GERD (gastroesophageal reflux disease) 12/16/2020   Spinal stenosis of lumbar region 08/26/2020   Gastric ulcer 08/25/2020   Scoliosis deformity of spine 07/01/2020   Degeneration of lumbar intervertebral disc 07/01/2020   Hyperlipidemia 06/17/2020   Symptomatic anemia 05/20/2020   Poor appetite 05/20/2020   Abdominal pain 05/20/2020   Nausea without vomiting 05/20/2020   Osteoarthritis of right knee 08/15/2019   Osteoarthritis of left knee 06/27/2019   Past Medical History:  Diagnosis  Date   Anemia    Arthritis    Diabetes mellitus without complication (HCC)    GERD (gastroesophageal reflux disease)    Gout    History of kidney stones    Hyperlipidemia    Hypertension    Past Surgical History:  Procedure Laterality Date   BIOPSY  05/25/2020   Procedure: BIOPSY;  Surgeon: Corbin Ade, MD;  [facility];  Service: Endoscopy;;   BIOPSY  03/25/2022   Procedure: BIOPSY;  Surgeon: Dolores Frame, MD;  [facility];  Service: Gastroenterology;;   BIOPSY  06/15/2022   Procedure: BIOPSY;  Surgeon: Corbin Ade, MD;  [facility];  Service: Endoscopy;;   CATARACT EXTRACTION W/PHACO Right 05/13/2019   Procedure: CATARACT EXTRACTION PHACO AND INTRAOCULAR LENS PLACEMENT (IOC);  Surgeon: Fabio Pierce, MD;  Location: [facility];  Service: Ophthalmology;  Laterality: Right;  CDE: 10.31   CATARACT EXTRACTION W/PHACO Left 05/27/2019   Procedure: CATARACT EXTRACTION PHACO AND INTRAOCULAR LENS  PLACEMENT (IOC);  Surgeon: Fabio Pierce, MD;  Location: [facility];  Service: Ophthalmology;  Laterality: Left;  CDE: 6.49   COLONOSCOPY     times two 2002 ,2007   COLONOSCOPY N/A 10/07/2013   Procedure: COLONOSCOPY;  Surgeon: Corbin Ade, MD;  [facility];  Service: Endoscopy;  Laterality: N/A;  8:30 AM   COLONOSCOPY N/A 10/05/2018   Dr. Jena Gauss: Diverticulosis.  Next colonoscopy 5 years due to first-degree relative with colon cancer   COLONOSCOPY WITH PROPOFOL N/A 09/27/2023   Procedure: COLONOSCOPY WITH PROPOFOL;  Surgeon: Corbin Ade, MD;  [facility];  Service: Endoscopy;  Laterality: N/A;  12:30pm, asa 3   ESOPHAGOGASTRODUODENOSCOPY (EGD) WITH PROPOFOL N/A 05/25/2020   [name]: 2 gastric ulcers.  No H. pylori.  Likely NSAID related.  Repeat EGD in 3 months   ESOPHAGOGASTRODUODENOSCOPY (EGD) WITH PROPOFOL N/A 09/17/2020   Procedure: ESOPHAGOGASTRODUODENOSCOPY (EGD) WITH PROPOFOL;  Surgeon: Corbin Ade, MD;  [facility];  Service: Endoscopy;  Laterality: N/A;  10:00am   ESOPHAGOGASTRODUODENOSCOPY (EGD) WITH PROPOFOL N/A 03/25/2022   Procedure: ESOPHAGOGASTRODUODENOSCOPY (EGD) WITH PROPOFOL;  Surgeon: Dolores Frame, MD;  [facility];  Service: Gastroenterology;  Laterality: N/A;   ESOPHAGOGASTRODUODENOSCOPY (EGD) WITH PROPOFOL N/A 06/15/2022   Procedure: ESOPHAGOGASTRODUODENOSCOPY (EGD) WITH PROPOFOL;  Surgeon: Corbin Ade, MD;  [facility];  Service: Endoscopy;  Laterality: N/A;  7:30am   JOINT REPLACEMENT     KNEE ARTHROPLASTY Left 06/27/2019   Procedure: COMPUTER ASSISTED TOTAL KNEE ARTHROPLASTY;  Surgeon: Samson Frederic, MD;  Location: [facility];  Service: Orthopedics;  Laterality: Left;   KNEE ARTHROPLASTY Right 08/15/2019   Procedure: COMPUTER ASSISTED TOTAL KNEE ARTHROPLASTY;  Surgeon: Samson Frederic, MD;  Location: [facility];  Service: Orthopedics;  Laterality: Right;   LEG SURGERY Right early 80's   POLYPECTOMY   09/27/2023   Procedure: POLYPECTOMY;  Surgeon: Corbin Ade, MD;  [facility];  Service: Endoscopy;;   Social History   Tobacco Use   Smoking status: Never   Smokeless tobacco: Never  Vaping Use   Vaping status: Never Used  Substance Use Topics   Alcohol use: Yes    Comment: occasionally   Drug use: No   Social History   Socioeconomic History   Marital status: Married    Spouse name: Not on file   Number of children: 2   Years of education: Not on file   Highest education level: 10th  grade  Occupational History   Occupation: Naval architect    Comment: runs a Designer, television/film set as well, Public librarian in [place]  Tobacco Use   Smoking status: Never   Smokeless tobacco: Never  Vaping Use   Vaping status: Never Used  Substance and Sexual Activity   Alcohol use: Yes    Comment: occasionally   Drug use: No   Sexual activity: Yes  Other Topics Concern   Not on file  Social History Narrative   Daughter is 88, Son is 55.  3 grandchildren. Married for 34 years   Social Drivers of Corporate investment banker Strain: Low Risk  (10/19/2023)   Overall Financial Resource Strain (CARDIA)    Difficulty of Paying Living Expenses: Not very hard  Food Insecurity: No Food Insecurity (10/19/2023)   Hunger Vital Sign    Worried About Running Out of Food in the Last Year: Never true    Ran Out of Food in the Last Year: Never true  Transportation Needs: No Transportation Needs (10/19/2023)   PRAPARE - Administrator, Civil Service (Medical): No    Lack of Transportation (Non-Medical): No  Physical Activity: Unknown (10/19/2023)   Exercise Vital Sign    Days of Exercise per Week: Patient declined    Minutes of Exercise per Session: 0 min  Stress: No Stress Concern Present (10/19/2023)   [employer] of Occupational Health - Occupational Stress Questionnaire    Feeling of Stress : Not at all  Social Connections: Unknown (10/19/2023)   Social Connection and Isolation Panel  [NHANES]    Frequency of Communication with Friends and Family: Three times a week    Frequency of Social Gatherings with Friends and Family: Once a week    Attends Religious Services: Patient declined    Database administrator or Organizations: No    Attends Engineer, structural: Not on file    Marital Status: Married  Catering manager Violence: Not on file   Family Status  Relation Name Status   Mother  Deceased   Father  Deceased   Brother  Deceased   Sister  Alive   MGM  Deceased   MGF  Deceased   PGM  Deceased   PGF  Deceased   Son  Alive   Daughter  Alive  No partnership data on file   Family History  Problem Relation Age of Onset   Colon cancer Mother        She died from her malignancy, 61   Lung cancer Father        Died from lung cancer, age 59   Emphysema Brother        Died at 82 from lung disease   COPD Brother    Dementia Sister    Healthy Son    Healthy Daughter    No Known Allergies    Review of Systems  Constitutional:  Negative for chills and fever.  Respiratory:  Negative for cough.   Cardiovascular:  Negative for chest pain and leg swelling.  Gastrointestinal:  Negative for constipation, diarrhea, nausea and vomiting.  Musculoskeletal:  Positive for joint pain.  Skin:  Negative for itching and rash.  Neurological:  Negative for dizziness and headaches.   Negative unless indicated in HPI   Objective:     BP 122/74   Pulse 83   Temp (!) 97.5 F (36.4 C) (Temporal)   Ht 5\' 9"  (1.753 m)   Wt 253 lb 12.8 oz (  115.1 kg)   SpO2 95%   BMI 37.48 kg/m  BP Readings from Last 3 Encounters:  01/24/24 122/74  11/23/23 118/72  10/24/23 103/78   Wt Readings from Last 3 Encounters:  01/24/24 253 lb 12.8 oz (115.1 kg)  11/23/23 244 lb (110.7 kg)  10/24/23 249 lb (112.9 kg)      Physical Exam Vitals and nursing note reviewed.  Constitutional:      Appearance: He is obese.  HENT:     Head: Normocephalic and atraumatic.     Nose:  Nose normal.     Mouth/Throat:     Mouth: Mucous membranes are moist.  Eyes:     General: No scleral icterus.    Extraocular Movements: Extraocular movements intact.     Conjunctiva/sclera: Conjunctivae normal.     Pupils: Pupils are equal, round, and reactive to light.  Cardiovascular:     Heart sounds: Normal heart sounds.  Pulmonary:     Effort: Pulmonary effort is normal.     Breath sounds: Normal breath sounds.  Musculoskeletal:     Lumbar back: Tenderness present.  Skin:    General: Skin is warm and dry.     Findings: No rash.  Neurological:     Mental Status: He is alert and oriented to person, place, and time.  Psychiatric:        Mood and Affect: Mood normal.        Behavior: Behavior normal.        Thought Content: Thought content normal.        Judgment: Judgment normal.    No results found for any visits on 01/24/24.  Last CBC Lab Results  Component Value Date   WBC 8.6 11/22/2023   HGB 13.7 11/22/2023   HCT 40.6 11/22/2023   MCV 93.3 11/22/2023   MCH 31.5 11/22/2023   RDW 13.6 11/22/2023   PLT 315 11/22/2023   Last metabolic panel Lab Results  Component Value Date   GLUCOSE 149 (H) 11/22/2023   NA 136 11/22/2023   K 4.4 11/22/2023   CL 101 11/22/2023   CO2 23 11/22/2023   BUN 43 (H) 11/22/2023   CREATININE 2.17 (H) 11/22/2023   GFRNONAA 33 (L) 11/22/2023   CALCIUM 10.2 11/22/2023   PHOS 3.0 04/04/2023   PROT 7.3 11/11/2022   ALBUMIN 3.8 04/04/2023   LABGLOB 3.5 06/01/2020   AGRATIO 0.9 06/01/2020   BILITOT 0.5 11/11/2022   ALKPHOS 48 11/11/2022   AST 24 11/11/2022   ALT 20 11/11/2022   ANIONGAP 12 11/22/2023   Last lipids Lab Results  Component Value Date   CHOL 172 07/19/2023   HDL 38 (L) 07/19/2023   LDLCALC 84 07/19/2023   TRIG 301 (H) 07/19/2023   CHOLHDL 4.5 07/19/2023   Last hemoglobin A1c Lab Results  Component Value Date   HGBA1C 6.0 (H) 10/24/2023   Last thyroid functions Lab Results  Component Value Date   TSH  2.330 08/28/2023   T4TOTAL 8.9 08/03/2023        Assessment & Plan:  Essential hypertension  Lumbar radiculopathy  Mixed rhinitis  Chronic kidney disease, stage 4 (severe) (HCC)  Gastroesophageal reflux disease, unspecified whether esophagitis present  Type 2 diabetes with nephropathy (HCC)  Moderate mixed hyperlipidemia not requiring statin therapy  Other orders -     Montelukast Sodium; Take 1 tablet (10 mg total) by mouth at bedtime.  Dispense: 90 tablet; Refill: 0  Bryan Atkinson is a 68 yrs old Caucasian male seen today fro  chronic disease management, no acute distress Rhinitis: Start Singulair 10 mg 1-tab at bedtime HTN: Continue losartan 12.5 mg, hydralazine 50 mg BID, amlodipine 2.5 mg daily, no refill needed Hyperlipidemia: Continue Pravastatin 40 mg daily and Tricor 145 mg daily, no refill needed GERD: Continue Pantoprazole 40 mg daily, no refill needed Back pain/Hip pain: follow up with Ortho when coming from for DM2: Continue Farxiga 5 mg daily and Glipizide 10 mg daily All question answered client instructed to call the clinic with any question answered Continue healthy lifestyle choices, including diet (rich in fruits, vegetables, and lean proteins, and low in salt and simple carbohydrates) and exercise (at least 30 minutes of moderate physical activity daily).     The above assessment and management plan was discussed with the patient. The patient verbalized understanding of and has agreed to the management plan. Patient is aware to call the clinic if they develop any new symptoms or if symptoms persist or worsen. Patient is aware when to return to the clinic for a follow-up visit. Patient educated on when it is appropriate to go to the emergency department.  Return in about 3 months (around 04/24/2024).    Arrie Aran Santa Lighter, [facility] [address] [place], [place] [address] [phone]    Note: This document was prepared by  Reubin Milan voice dictation technology and any errors that results from this process are unintentional.

## 2024-02-18 ENCOUNTER — Other Ambulatory Visit: Payer: Self-pay | Admitting: Nurse Practitioner

## 2024-02-18 DIAGNOSIS — E039 Hypothyroidism, unspecified: Secondary | ICD-10-CM

## 2024-02-18 DIAGNOSIS — M1A2790 Drug-induced chronic gout, unspecified ankle and foot, without tophus (tophi): Secondary | ICD-10-CM

## 2024-02-22 ENCOUNTER — Other Ambulatory Visit

## 2024-02-22 DIAGNOSIS — E211 Secondary hyperparathyroidism, not elsewhere classified: Secondary | ICD-10-CM | POA: Diagnosis not present

## 2024-02-22 DIAGNOSIS — N189 Chronic kidney disease, unspecified: Secondary | ICD-10-CM | POA: Diagnosis not present

## 2024-02-22 DIAGNOSIS — R809 Proteinuria, unspecified: Secondary | ICD-10-CM | POA: Diagnosis not present

## 2024-02-22 DIAGNOSIS — D631 Anemia in chronic kidney disease: Secondary | ICD-10-CM | POA: Diagnosis not present

## 2024-02-29 DIAGNOSIS — N2581 Secondary hyperparathyroidism of renal origin: Secondary | ICD-10-CM | POA: Diagnosis not present

## 2024-02-29 DIAGNOSIS — E1122 Type 2 diabetes mellitus with diabetic chronic kidney disease: Secondary | ICD-10-CM | POA: Diagnosis not present

## 2024-02-29 DIAGNOSIS — N049 Nephrotic syndrome with unspecified morphologic changes: Secondary | ICD-10-CM | POA: Diagnosis not present

## 2024-02-29 DIAGNOSIS — N1832 Chronic kidney disease, stage 3b: Secondary | ICD-10-CM | POA: Diagnosis not present

## 2024-03-25 ENCOUNTER — Ambulatory Visit

## 2024-03-25 NOTE — Progress Notes (Signed)
 Unable to complete diabetic eye exam due to pt eyes not dilating

## 2024-03-26 ENCOUNTER — Other Ambulatory Visit: Payer: Self-pay | Admitting: Nurse Practitioner

## 2024-03-26 NOTE — Telephone Encounter (Signed)
 Last Fill: Unk, Historical Provider  Last OV: 01/24/23 Next OV: 04/24/24   Routing to provider for review/authorization.

## 2024-03-26 NOTE — Telephone Encounter (Signed)
 Copied from CRM [id number]. Topic: Clinical - Medication Refill >> Mar 26, 2024  9:45 AM Essie A wrote: Medication: pravastatin  (PRAVACHOL ) 40 MG tablet  Has the patient contacted their pharmacy? Yes (Agent: If no, request that the patient contact the pharmacy for the refill. If patient does not wish to contact the pharmacy document the reason why and proceed with request.) (Agent: If yes, when and what did the pharmacy advise?)  This is the patient's preferred pharmacy:  [id number] - [place], [place] - [address] [place] [address] Phone: [phone] Fax: [phone]  Is this the correct pharmacy for this prescription? Yes If no, delete pharmacy and type the correct one.   Has the prescription been filled recently? Yes  Is the patient out of the medication? Yes, took last pill yesterday  Has the patient been seen for an appointment in the last year OR does the patient have an upcoming appointment? Yes  Can we respond through MyChart? Yes  Agent: Please be advised that Rx refills may take up to 3 business days. We ask that you follow-up with your pharmacy.

## 2024-03-29 ENCOUNTER — Telehealth: Payer: Self-pay

## 2024-03-29 MED ORDER — PRAVASTATIN SODIUM 40 MG PO TABS: 40.0000 mg | ORAL_TABLET | Freq: Every day | ORAL | 1 refills | Status: DC

## 2024-03-29 NOTE — Addendum Note (Signed)
 Addended by: Curvin Downing on: 03/29/2024 04:28 PM   Modules accepted: Orders

## 2024-03-29 NOTE — Telephone Encounter (Signed)
 Copied from CRM [id number]. Topic: Clinical - Prescription Issue >> Mar 29, 2024  9:39 AM Bryan Atkinson wrote: Reason for CRM: Pt is still waiting to hear back about pravastatin  (PRAVACHOL ) 40 MG tablet

## 2024-03-29 NOTE — Telephone Encounter (Signed)
 Is he waiting for a refill? If so, ok to refill.

## 2024-03-29 NOTE — Telephone Encounter (Signed)
 Pt confirmed he needed a refill - meds sent

## 2024-04-03 ENCOUNTER — Other Ambulatory Visit: Payer: Self-pay | Admitting: Nurse Practitioner

## 2024-04-03 DIAGNOSIS — E1121 Type 2 diabetes mellitus with diabetic nephropathy: Secondary | ICD-10-CM

## 2024-04-04 ENCOUNTER — Ambulatory Visit: Payer: Self-pay | Admitting: Nurse Practitioner

## 2024-04-21 ENCOUNTER — Other Ambulatory Visit: Payer: Self-pay | Admitting: Nurse Practitioner

## 2024-04-24 ENCOUNTER — Encounter: Payer: Self-pay | Admitting: Nurse Practitioner

## 2024-04-24 ENCOUNTER — Ambulatory Visit: Payer: Self-pay | Admitting: Nurse Practitioner

## 2024-04-24 ENCOUNTER — Ambulatory Visit: Admitting: Nurse Practitioner

## 2024-04-24 VITALS — BP 128/80 | HR 85 | Temp 98.1°F | Ht 69.0 in | Wt 258.6 lb

## 2024-04-24 DIAGNOSIS — E1121 Type 2 diabetes mellitus with diabetic nephropathy: Secondary | ICD-10-CM

## 2024-04-24 DIAGNOSIS — I10 Essential (primary) hypertension: Secondary | ICD-10-CM

## 2024-04-24 DIAGNOSIS — M4120 Other idiopathic scoliosis, site unspecified: Secondary | ICD-10-CM | POA: Diagnosis not present

## 2024-04-24 DIAGNOSIS — K219 Gastro-esophageal reflux disease without esophagitis: Secondary | ICD-10-CM

## 2024-04-24 LAB — BAYER DCA HB A1C WAIVED: HB A1C (BAYER DCA - WAIVED): 7 % — ABNORMAL HIGH (ref 4.8–5.6)

## 2024-04-24 NOTE — Progress Notes (Signed)
 Established Patient Office Visit  Subjective  Patient ID: , male    DOB: 1956/09/21  Age: 68 y.o. MRN: [id number]  Chief Complaint  Patient presents with   Medical Management of Chronic Issues    3 month    HPI Bryan Atkinson is a 68 yrs old male present 7/9/205 fro 35-months follow up for chronic diseases management. He has an appointment with optometry 05/12/2024 Hypertension, follow-up  BP Readings from Last 3 Encounters:  04/24/24 128/80  01/24/24 122/74  11/23/23 118/72   Wt Readings from Last 3 Encounters:  04/24/24 258 lb 9.6 oz (117.3 kg)  01/24/24 253 lb 12.8 oz (115.1 kg)  11/23/23 244 lb (110.7 kg)     He was last seen for hypertension 3 months ago.  BP at that visit was 134/81. Management since that visit includes amlodopine 5 mg, hydralizine.  He reports excellent compliance with treatment. He is not having side effects.  He is following a Regular diet. He is not exercising. He does not smoke.  Use of agents associated with hypertension: none.   Outside blood pressures are 128/82. Symptoms: No chest pain No chest pressure  No palpitations No syncope  No dyspnea No orthopnea  No paroxysmal nocturnal dyspnea No lower extremity edema   Pertinent labs Lab Results  Component Value Date   CHOL 172 07/19/2023   HDL 38 (L) 07/19/2023   LDLCALC 84 07/19/2023   TRIG 301 (H) 07/19/2023   CHOLHDL 4.5 07/19/2023   Lab Results  Component Value Date   NA 136 11/22/2023   K 4.4 11/22/2023   CREATININE 2.17 (H) 11/22/2023   GFRNONAA 33 (L) 11/22/2023   GLUCOSE 149 (H) 11/22/2023   TSH 2.330 08/28/2023     The 10-year ASCVD risk score (Arnett DK, et al., 2019) is: 29.7%  Lipid/Cholesterol, Follow-up  Last lipid panel Other pertinent labs  Lab Results  Component Value Date   CHOL 172 07/19/2023   HDL 38 (L) 07/19/2023   LDLCALC 84 07/19/2023   TRIG 301 (H) 07/19/2023   CHOLHDL 4.5 07/19/2023   Lab Results  Component Value Date   ALT 20  11/11/2022   AST 24 11/11/2022   PLT 315 11/22/2023   TSH 2.330 08/28/2023     He was last seen for this 3 months ago.  Management since that visit includes Pravastatin  40 mg dialy.  He reports excellent compliance with treatment. He is not having side effects.   Symptoms: No chest pain No chest pressure/discomfort  No dyspnea No lower extremity edema  No numbness or tingling of extremity No orthopnea  No palpitations No paroxysmal nocturnal dyspnea  No speech difficulty No syncope   Current diet: on average, 2 meals per day Current exercise: none  The 10-year ASCVD risk score (Arnett DK, et al., 2019) is: 29.7%  GERD, Follow up:  The patient was last seen for GERD 3 months ago. Changes made since that visit include pantoprazole  40 mg twice daily.  He reports excellent compliance with treatment. He is not having side effects. SABRA  He IS experiencing heartburn. He is NOT experiencing abdominal bloating, cough, difficulty swallowing, dysphagia, or early satiety  Scoliosis New Problem  68 year old male with a known history of scoliosis who presents today for evaluation of chronic back pain. He reports that he has been receiving annual epidural steroid injections for symptom management, which provide only minimal and temporary relief. It helps for a while, then wears off, he states. He describes the pain  as a persistent dull ache with intermittent sharp exacerbations, primarily in the lower back. He denies any recent trauma, numbness, or weakness in the extremities. The patient expresses interest in pursuing physical therapy as a non-invasive option to help alleviate his symptoms and improve mobility.  Obesity  68 year old male with a BMI of 38.19 who presents for evaluation and management of obesity. He reports long-standing weight gain over the past several years and acknowledges limited physical activity, partially due to chronic back pain related to scoliosis. The patient  denies recent unintentional weight gain or loss. He reports increased fatigue with exertion and difficulty with mobility, which he attributes to both his weight and spinal discomfort. He is interested in exploring weight management strategies, including dietary changes and exercise, and is open to discussing referral to nutrition services and incorporating physical therapy to support both weight loss and pain reduction.  Patient Active Problem List   Diagnosis Date Noted   Welcome to Medicare preventive visit 11/23/2023   Atypical mole 11/23/2023   Chronic kidney disease, stage 4 (severe) (HCC) 10/24/2023   Obesity, morbid (HCC) 10/24/2023   Immunization due 10/24/2023   Abnormal serum thyroid  stimulating hormone (TSH) level 07/20/2023   Gout 07/19/2023   Screening PSA (prostate specific antigen) 07/19/2023   Routine medical exam 07/19/2023   Encounter for general adult medical examination with abnormal findings 07/19/2023   Obesity with body mass index 30 or greater 07/19/2023   Paronychia of toe 07/19/2023   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   Gastrointestinal hemorrhage    Acute on chronic blood loss anemia 03/24/2022   Type 2 diabetes with nephropathy (HCC) 03/24/2022   Essential hypertension 03/24/2022   Acute renal failure superimposed on stage 3b chronic kidney disease (HCC) 03/24/2022   IDA (iron  deficiency anemia) 09/22/2021   Constipation 09/22/2021   Lumbar radiculopathy 06/16/2021   GERD (gastroesophageal reflux disease) 12/16/2020   Spinal stenosis of lumbar region 08/26/2020   Gastric ulcer 08/25/2020   Scoliosis deformity of spine 07/01/2020   Degeneration of lumbar intervertebral disc 07/01/2020   Hyperlipidemia 06/17/2020   Symptomatic anemia 05/20/2020   Poor appetite 05/20/2020   Abdominal pain 05/20/2020   Nausea without vomiting 05/20/2020   Osteoarthritis of right knee 08/15/2019   Osteoarthritis of left knee 06/27/2019   Past Medical History:  Diagnosis Date    Anemia    Arthritis    Diabetes mellitus without complication (HCC)    GERD (gastroesophageal reflux disease)    Gout    History of kidney stones    Hyperlipidemia    Hypertension    Past Surgical History:  Procedure Laterality Date   BIOPSY  05/25/2020   Procedure: BIOPSY;  Surgeon: Shaaron Lamar HERO, MD;  [facility];  Service: Endoscopy;;   BIOPSY  03/25/2022   Procedure: BIOPSY;  Surgeon: Eartha Angelia Sieving, MD;  [facility];  Service: Gastroenterology;;   BIOPSY  06/15/2022   Procedure: BIOPSY;  Surgeon: Shaaron Lamar HERO, MD;  [facility];  Service: Endoscopy;;   CATARACT EXTRACTION W/PHACO Right 05/13/2019   Procedure: CATARACT EXTRACTION PHACO AND INTRAOCULAR LENS PLACEMENT (IOC);  Surgeon: Harrie Agent, MD;  Location: [facility];  Service: Ophthalmology;  Laterality: Right;  CDE: 10.31   CATARACT EXTRACTION W/PHACO Left 05/27/2019   Procedure: CATARACT EXTRACTION PHACO AND INTRAOCULAR LENS PLACEMENT (IOC);  Surgeon: Harrie Agent, MD;  Location: [facility];  Service: Ophthalmology;  Laterality: Left;  CDE: 6.49   COLONOSCOPY     times two 2002 ,  2007   COLONOSCOPY N/A 10/07/2013   Procedure: COLONOSCOPY;  Surgeon: Lamar CHRISTELLA Hollingshead, MD;  [facility];  Service: Endoscopy;  Laterality: N/A;  8:30 AM   COLONOSCOPY N/A 10/05/2018   Dr. Hollingshead: Diverticulosis.  Next colonoscopy 5 years due to first-degree relative with colon cancer   COLONOSCOPY WITH PROPOFOL  N/A 09/27/2023   Procedure: COLONOSCOPY WITH PROPOFOL ;  Surgeon: Hollingshead Lamar CHRISTELLA, MD;  [facility];  Service: Endoscopy;  Laterality: N/A;  12:30pm, asa 3   ESOPHAGOGASTRODUODENOSCOPY (EGD) WITH PROPOFOL  N/A 05/25/2020   [name]: 2 gastric ulcers.  No H. pylori.  Likely NSAID related.  Repeat EGD in 3 months   ESOPHAGOGASTRODUODENOSCOPY (EGD) WITH PROPOFOL  N/A 09/17/2020   Procedure: ESOPHAGOGASTRODUODENOSCOPY (EGD) WITH PROPOFOL ;  Surgeon: Hollingshead Lamar CHRISTELLA, MD;  [facility];  Service: Endoscopy;  Laterality: N/A;  10:00am   ESOPHAGOGASTRODUODENOSCOPY (EGD) WITH PROPOFOL  N/A 03/25/2022   Procedure: ESOPHAGOGASTRODUODENOSCOPY (EGD) WITH PROPOFOL ;  Surgeon: Eartha Angelia Sieving, MD;  [facility];  Service: Gastroenterology;  Laterality: N/A;   ESOPHAGOGASTRODUODENOSCOPY (EGD) WITH PROPOFOL  N/A 06/15/2022   Procedure: ESOPHAGOGASTRODUODENOSCOPY (EGD) WITH PROPOFOL ;  Surgeon: Hollingshead Lamar CHRISTELLA, MD;  [facility];  Service: Endoscopy;  Laterality: N/A;  7:30am   JOINT REPLACEMENT     KNEE ARTHROPLASTY Left 06/27/2019   Procedure: COMPUTER ASSISTED TOTAL KNEE ARTHROPLASTY;  Surgeon: Fidel Rogue, MD;  [facility];  Service: Orthopedics;  Laterality: Left;   KNEE ARTHROPLASTY Right 08/15/2019   Procedure: COMPUTER ASSISTED TOTAL KNEE ARTHROPLASTY;  Surgeon: Fidel Rogue, MD;  Location: [facility];  Service: Orthopedics;  Laterality: Right;   LEG SURGERY Right early 80's   POLYPECTOMY  09/27/2023   Procedure: POLYPECTOMY;  Surgeon: Hollingshead Lamar CHRISTELLA, MD;  [facility];  Service: Endoscopy;;   Social History   Tobacco Use   Smoking status: Never   Smokeless tobacco: Never  Vaping Use   Vaping status: Never Used  Substance Use Topics   Alcohol  use: Yes    Comment: occasionally   Drug use: No   Social History   Socioeconomic History   Marital status: Married    Spouse name: Not on file   Number of children: 2   Years of education: Not on file   Highest education level: Never attended school  Occupational History   Occupation: Truck Hospital doctor    Comment: runs a Designer, television/film set as well, [employer] in [place]  Tobacco Use   Smoking status: Never   Smokeless tobacco: Never  Vaping Use   Vaping status: Never Used  Substance and Sexual Activity   Alcohol  use: Yes    Comment: occasionally   Drug use: No   Sexual activity: Yes  Other Topics Concern   Not on file  Social History Narrative   Daughter is 5, Son is 14.  3  grandchildren. Married for 34 years   Social Drivers of Corporate investment banker Strain: Low Risk  (04/21/2024)   Overall Financial Resource Strain (CARDIA)    Difficulty of Paying Living Expenses: Not hard at all  Food Insecurity: No Food Insecurity (04/21/2024)   Hunger Vital Sign    Worried About Running Out of Food in the Last Year: Never true    Ran Out of Food in the Last Year: Never true  Transportation Needs: No Transportation Needs (04/21/2024)   PRAPARE - Administrator, Civil Service (Medical): No    Lack of Transportation (Non-Medical): No  Physical  Activity: Unknown (04/21/2024)   Exercise Vital Sign    Days of Exercise per Week: 1 day    Minutes of Exercise per Session: Patient declined  Stress: No Stress Concern Present (04/21/2024)   [employer] of Occupational Health - Occupational Stress Questionnaire    Feeling of Stress: Not at all  Social Connections: Moderately Isolated (04/21/2024)   Social Connection and Isolation Panel    Frequency of Communication with Friends and Family: Twice a week    Frequency of Social Gatherings with Friends and Family: Once a week    Attends Religious Services: Patient declined    Database administrator or Organizations: No    Attends Engineer, structural: Not on file    Marital Status: Married  Catering manager Violence: Not on file   Family Status  Relation Name Status   Mother  Deceased   Father  Deceased   Brother  Deceased   Sister  Alive   MGM  Deceased   MGF  Deceased   PGM  Deceased   PGF  Deceased   Son  Alive   Daughter  Alive  No partnership data on file   Family History  Problem Relation Age of Onset   Colon cancer Mother        She died from her malignancy, 27   Lung cancer Father        Died from lung cancer, age 4   Emphysema Brother        Died at 74 from lung disease   COPD Brother    Dementia Sister    Healthy Son    Healthy Daughter    No Known Allergies    Review of  Systems  Constitutional:  Negative for chills and fever.  HENT:  Negative for sinus pain.   Respiratory:  Negative for cough and shortness of breath.   Cardiovascular:  Negative for chest pain and leg swelling.  Gastrointestinal:  Negative for nausea and vomiting.  Skin:  Negative for itching and rash.  Neurological:  Negative for dizziness and headaches.  Endo/Heme/Allergies:  Negative for polydipsia.   Negative unless indicated in HPI   Objective:     BP 128/80   Pulse 85   Temp 98.1 F (36.7 C) (Temporal)   Ht 5' 9 (1.753 m)   Wt 258 lb 9.6 oz (117.3 kg)   SpO2 94%   BMI 38.19 kg/m  BP Readings from Last 3 Encounters:  04/24/24 128/80  01/24/24 122/74  11/23/23 118/72   Wt Readings from Last 3 Encounters:  04/24/24 258 lb 9.6 oz (117.3 kg)  01/24/24 253 lb 12.8 oz (115.1 kg)  11/23/23 244 lb (110.7 kg)      Physical Exam Vitals and nursing note reviewed.  Constitutional:      General: He is not in acute distress. HENT:     Head: Normocephalic and atraumatic.     Nose: Nose normal.  Eyes:     General: No scleral icterus.    Extraocular Movements: Extraocular movements intact.     Conjunctiva/sclera: Conjunctivae normal.     Pupils: Pupils are equal, round, and reactive to light.  Cardiovascular:     Heart sounds: Normal heart sounds.  Pulmonary:     Effort: Pulmonary effort is normal.     Breath sounds: Normal breath sounds.  Musculoskeletal:        General: Normal range of motion.     Right lower leg: No edema.  Left lower leg: No edema.  Skin:    General: Skin is warm and dry.  Neurological:     Mental Status: He is alert and oriented to person, place, and time.  Psychiatric:        Mood and Affect: Mood normal.        Behavior: Behavior normal.        Thought Content: Thought content normal.        Judgment: Judgment normal.      No results found for any visits on 04/24/24.  Last CBC Lab Results  Component Value Date   WBC 8.6  11/22/2023   HGB 13.7 11/22/2023   HCT 40.6 11/22/2023   MCV 93.3 11/22/2023   MCH 31.5 11/22/2023   RDW 13.6 11/22/2023   PLT 315 11/22/2023   Last metabolic panel Lab Results  Component Value Date   GLUCOSE 149 (H) 11/22/2023   NA 136 11/22/2023   K 4.4 11/22/2023   CL 101 11/22/2023   CO2 23 11/22/2023   BUN 43 (H) 11/22/2023   CREATININE 2.17 (H) 11/22/2023   GFRNONAA 33 (L) 11/22/2023   CALCIUM  10.2 11/22/2023   PHOS 3.0 04/04/2023   PROT 7.3 11/11/2022   ALBUMIN 3.8 04/04/2023   LABGLOB 3.5 06/01/2020   AGRATIO 0.9 06/01/2020   BILITOT 0.5 11/11/2022   ALKPHOS 48 11/11/2022   AST 24 11/11/2022   ALT 20 11/11/2022   ANIONGAP 12 11/22/2023   Last lipids Lab Results  Component Value Date   CHOL 172 07/19/2023   HDL 38 (L) 07/19/2023   LDLCALC 84 07/19/2023   TRIG 301 (H) 07/19/2023   CHOLHDL 4.5 07/19/2023   Last hemoglobin A1c Lab Results  Component Value Date   HGBA1C 6.0 (H) 10/24/2023   Last thyroid  functions Lab Results  Component Value Date   TSH 2.330 08/28/2023   T4TOTAL 8.9 08/03/2023        Assessment & Plan:  Type 2 diabetes with nephropathy (HCC) -     Bayer DCA Hb A1c Waived  Essential hypertension  Gastroesophageal reflux disease, unspecified whether esophagitis present  Obesity, morbid (HCC)  Other idiopathic scoliosis, unspecified spinal region  Bryan Atkinson is a 68 yrs old Caucasian male seen today fro chronic disease management, no acute distress Scoliosis: Bryan Atkinson will find out the name of the place and would like to refer him to be sent out to Diabetes: A1c has increased to 7.0% today client is counseled on lifestyle changes, LBD continue taking glipizide  10 mg daily and Farxiga 5 mg daily, no refill needed today Hypertension: Client will continue taking amlodipine  5 mg daily hydralazine 50 mg twice a day and losartan 12.5 mg daily.  No refill needed GERD: Continue pantoprazole  40 mg daily, no refill needed Obesity:  He will continue working on lifestyle modification Hypothyroidism: Continue levothyroxine  75 mcg daily no refill needed  Future: referral to PT for scoliosis  Encourage healthy lifestyle choices, including diet (rich in fruits, vegetables, and lean proteins, and low in salt and simple carbohydrates) and exercise (at least 30 minutes of moderate physical activity daily).     The above assessment and management plan was discussed with the patient. The patient verbalized understanding of and has agreed to the management plan. Patient is aware to call the clinic if they develop any new symptoms or if symptoms persist or worsen. Patient is aware when to return to the clinic for a follow-up visit. Patient educated on when it is appropriate to go  to the emergency department.  Return in about 3 months (around 07/25/2024).    Bryan Snook [place] Thompson, DNP [facility] [address] [place], [place] [address] [phone]    Note: This document was prepared by [employer] voice dictation technology and any errors that results from this process are unintentional.

## 2024-05-07 ENCOUNTER — Ambulatory Visit: Payer: Self-pay | Admitting: Nurse Practitioner

## 2024-05-07 LAB — HM DIABETES EYE EXAM

## 2024-05-16 ENCOUNTER — Other Ambulatory Visit: Payer: Self-pay | Admitting: Nurse Practitioner

## 2024-05-16 DIAGNOSIS — M1A2790 Drug-induced chronic gout, unspecified ankle and foot, without tophus (tophi): Secondary | ICD-10-CM

## 2024-05-30 ENCOUNTER — Other Ambulatory Visit: Payer: Self-pay | Admitting: Nurse Practitioner

## 2024-05-30 ENCOUNTER — Other Ambulatory Visit: Payer: Self-pay | Admitting: *Deleted

## 2024-05-30 DIAGNOSIS — D649 Anemia, unspecified: Secondary | ICD-10-CM

## 2024-05-31 ENCOUNTER — Inpatient Hospital Stay: Payer: PPO | Attending: Oncology

## 2024-05-31 DIAGNOSIS — D509 Iron deficiency anemia, unspecified: Secondary | ICD-10-CM | POA: Insufficient documentation

## 2024-05-31 DIAGNOSIS — N1832 Chronic kidney disease, stage 3b: Secondary | ICD-10-CM

## 2024-05-31 LAB — BASIC METABOLIC PANEL WITH GFR
Anion gap: 11 (ref 5–15)
BUN: 41 mg/dL — ABNORMAL HIGH (ref 8–23)
CO2: 22 mmol/L (ref 22–32)
Calcium: 9.9 mg/dL (ref 8.9–10.3)
Chloride: 101 mmol/L (ref 98–111)
Creatinine, Ser: 2.27 mg/dL — ABNORMAL HIGH (ref 0.61–1.24)
GFR, Estimated: 31 mL/min — ABNORMAL LOW (ref >60)
Glucose, Bld: 156 mg/dL — ABNORMAL HIGH (ref 70–99)
Potassium: 4 mmol/L (ref 3.5–5.1)
Sodium: 134 mmol/L — ABNORMAL LOW (ref 135–145)

## 2024-05-31 LAB — IRON AND TIBC
Iron: 53 ug/dL (ref 45–182)
Saturation Ratios: 16 % — ABNORMAL LOW (ref 17.9–39.5)
TIBC: 336 ug/dL (ref 250–450)
UIBC: 283 ug/dL

## 2024-05-31 LAB — CBC WITH DIFFERENTIAL/PLATELET
Abs Immature Granulocytes: 0.14 K/uL — ABNORMAL HIGH (ref 0.00–0.07)
Basophils Absolute: 0.1 K/uL (ref 0.0–0.1)
Basophils Relative: 1 %
Eosinophils Absolute: 0.3 K/uL (ref 0.0–0.5)
Eosinophils Relative: 3 %
HCT: 38.3 % — ABNORMAL LOW (ref 39.0–52.0)
Hemoglobin: 12.8 g/dL — ABNORMAL LOW (ref 13.0–17.0)
Immature Granulocytes: 1 %
Lymphocytes Relative: 17 %
Lymphs Abs: 1.8 K/uL (ref 0.7–4.0)
MCH: 30.9 pg (ref 26.0–34.0)
MCHC: 33.4 g/dL (ref 30.0–36.0)
MCV: 92.5 fL (ref 80.0–100.0)
Monocytes Absolute: 1 K/uL (ref 0.1–1.0)
Monocytes Relative: 10 %
Neutro Abs: 7.4 K/uL (ref 1.7–7.7)
Neutrophils Relative %: 68 %
Platelets: 324 K/uL (ref 150–400)
RBC: 4.14 MIL/uL — ABNORMAL LOW (ref 4.22–5.81)
RDW: 13.9 % (ref 11.5–15.5)
WBC: 10.8 K/uL — ABNORMAL HIGH (ref 4.0–10.5)
nRBC: 0 % (ref 0.0–0.2)

## 2024-05-31 LAB — FERRITIN: Ferritin: 232 ng/mL (ref 24–336)

## 2024-06-06 ENCOUNTER — Other Ambulatory Visit: Payer: Self-pay | Admitting: Nurse Practitioner

## 2024-06-06 DIAGNOSIS — E039 Hypothyroidism, unspecified: Secondary | ICD-10-CM

## 2024-06-07 ENCOUNTER — Inpatient Hospital Stay: Payer: PPO | Admitting: Oncology

## 2024-06-07 DIAGNOSIS — D509 Iron deficiency anemia, unspecified: Secondary | ICD-10-CM | POA: Diagnosis not present

## 2024-06-07 DIAGNOSIS — D631 Anemia in chronic kidney disease: Secondary | ICD-10-CM | POA: Diagnosis not present

## 2024-06-07 DIAGNOSIS — N1832 Chronic kidney disease, stage 3b: Secondary | ICD-10-CM

## 2024-06-07 DIAGNOSIS — N184 Chronic kidney disease, stage 4 (severe): Secondary | ICD-10-CM | POA: Diagnosis not present

## 2024-06-07 NOTE — Assessment & Plan Note (Addendum)
-  Requires intermittent IV iron  last given in August 2023.  He is currently taking oral iron  supplements 325 mg ferrous sulfate every other day with improvement of his constipation. - Lab work shows a decline in his iron  saturations and hemoglobin.  We discussed trying 2 additional doses of IV Venofer  (300 mg). -Patient was agreeable. - Continue oral iron  supplements every other day. -Most recent colonoscopy from December 2024 revealed one 8 mm tubular adenoma polyp in the transverse colon.  Otherwise, colonoscopy unremarkable.  No evidence of bleeding. -Return to clinic in 6 months with labs a few days before.

## 2024-06-07 NOTE — Progress Notes (Signed)
 Virtual Visit via Telephone Note  I connected with Bryan Atkinson on 06/07/24 at  2:00 PM EDT by telephone and verified that I am speaking with the correct person using two identifiers.  Location: Patient: Home  Provider: Home office   I discussed the limitations, risks, security and privacy concerns of performing an evaluation and management service by telephone and the availability of in person appointments. I also discussed with the patient that there may be a patient responsible charge related to this service. The patient expressed understanding and agreed to proceed.   History of Present Illness: Patient is a 68 year old male who returns to clinic today for iron  deficiency anemia CKD stage IV.  He is currently taking iron  tablets daily with intermittent IV iron .   Had a colonoscopy by Dr. Shaaron on 09/27/2023 which showed an 8 mm polyp in transverse colon.  Pathology showed tubular adenoma but negative for high-grade dysplasia or malignancy.   He last received IV iron  in August 2023.  He denies any bleeding, bright red blood per rectum, melena or hematochezia.   Overall, he is doing well.  Reports he is taking his iron  supplement every other day which has helped with his constipation.  Reports his energy levels have improved and are 75% and appetite is 100%.   Observations/Objective:Review of Systems  Gastrointestinal:  Positive for constipation.    Physical Exam Neurological:     Mental Status: He is alert and oriented to person, place, and time.     Assessment and Plan: Assessment & Plan Anemia in chronic kidney disease, unspecified CKD stage -Requires intermittent IV iron  last given in August 2023.  He is currently taking oral iron  supplements 325 mg ferrous sulfate every other day with improvement of his constipation. - Lab work shows a decline in his iron  saturations and hemoglobin.  We discussed trying 2 additional doses of IV Venofer  (300 mg). -Patient was  agreeable. - Continue oral iron  supplements every other day. -Most recent colonoscopy from December 2024 revealed one 8 mm tubular adenoma polyp in the transverse colon.  Otherwise, colonoscopy unremarkable.  No evidence of bleeding. -Return to clinic in 6 months with labs a few days before.   I discussed the assessment and treatment plan with the patient. The patient was provided an opportunity to ask questions and all were answered. The patient agreed with the plan and demonstrated an understanding of the instructions.   The patient was advised to call back or seek an in-person evaluation if the symptoms worsen or if the condition fails to improve as anticipated.  I provided 12 minutes of non-face-to-face time during this encounter.   Delon FORBES Hope, NP

## 2024-06-13 ENCOUNTER — Inpatient Hospital Stay

## 2024-06-14 ENCOUNTER — Inpatient Hospital Stay

## 2024-06-14 VITALS — BP 150/84 | HR 80 | Temp 96.7°F | Resp 18

## 2024-06-14 DIAGNOSIS — D509 Iron deficiency anemia, unspecified: Secondary | ICD-10-CM | POA: Diagnosis not present

## 2024-06-14 DIAGNOSIS — D500 Iron deficiency anemia secondary to blood loss (chronic): Secondary | ICD-10-CM

## 2024-06-14 MED ORDER — ACETAMINOPHEN 325 MG PO TABS
650.0000 mg | ORAL_TABLET | Freq: Once | ORAL | Status: AC
  Administered 2024-06-14: 650 mg via ORAL
  Filled 2024-06-14: qty 2

## 2024-06-14 MED ORDER — IRON SUCROSE 300 MG IVPB - SIMPLE MED
300.0000 mg | Freq: Once | Status: AC
Start: 2024-06-15 — End: 2024-06-14
  Administered 2024-06-14: 300 mg via INTRAVENOUS
  Filled 2024-06-14: qty 300

## 2024-06-14 MED ORDER — CETIRIZINE HCL 10 MG PO TABS
10.0000 mg | ORAL_TABLET | Freq: Once | ORAL | Status: AC
  Administered 2024-06-14: 10 mg via ORAL
  Filled 2024-06-14: qty 1

## 2024-06-14 MED ORDER — SODIUM CHLORIDE 0.9 % IV SOLN
INTRAVENOUS | Status: DC
Start: 2024-06-14 — End: 2024-06-14

## 2024-06-14 NOTE — Patient Instructions (Signed)

## 2024-06-14 NOTE — Progress Notes (Signed)
 Patient tolerated iron infusion with no complaints voiced.  Peripheral IV site clean and dry with good blood return noted before and after infusion.  Band aid applied.  VSS with discharge and left in satisfactory condition with no s/s of distress noted.

## 2024-06-21 ENCOUNTER — Inpatient Hospital Stay: Attending: Oncology

## 2024-06-21 VITALS — BP 135/75 | HR 76 | Temp 97.4°F | Resp 18

## 2024-06-21 DIAGNOSIS — D500 Iron deficiency anemia secondary to blood loss (chronic): Secondary | ICD-10-CM

## 2024-06-21 DIAGNOSIS — N184 Chronic kidney disease, stage 4 (severe): Secondary | ICD-10-CM | POA: Insufficient documentation

## 2024-06-21 DIAGNOSIS — D631 Anemia in chronic kidney disease: Secondary | ICD-10-CM | POA: Insufficient documentation

## 2024-06-21 MED ORDER — CETIRIZINE HCL 10 MG PO TABS
10.0000 mg | ORAL_TABLET | Freq: Once | ORAL | Status: AC
  Administered 2024-06-21: 10 mg via ORAL
  Filled 2024-06-21: qty 1

## 2024-06-21 MED ORDER — SODIUM CHLORIDE 0.9 % IV SOLN
INTRAVENOUS | Status: DC
Start: 2024-06-21 — End: 2024-06-21

## 2024-06-21 MED ORDER — ACETAMINOPHEN 325 MG PO TABS
650.0000 mg | ORAL_TABLET | Freq: Once | ORAL | Status: AC
  Administered 2024-06-21: 650 mg via ORAL
  Filled 2024-06-21: qty 2

## 2024-06-21 MED ORDER — IRON SUCROSE 300 MG IVPB - SIMPLE MED
300.0000 mg | Freq: Once | Status: AC
  Administered 2024-06-21: 300 mg via INTRAVENOUS
  Filled 2024-06-21: qty 200

## 2024-06-21 NOTE — Patient Instructions (Signed)
 [facility] - A DEPT OF [facility]  Discharge Instructions: Thank you for choosing [facility] to provide your oncology and hematology care.  If you have a lab appointment with the Cancer Center - please note that after April 8th, 2024, all labs will be drawn in the cancer center.  You do not have to check in or register with the main entrance as you have in the past but will complete your check-in in the cancer center.  Wear comfortable clothing and clothing appropriate for easy access to any Portacath or PICC line.   We strive to give you quality time with your provider. You may need to reschedule your appointment if you arrive late (15 or more minutes).  Arriving late affects you and other patients whose appointments are after yours.  Also, if you miss three or more appointments without notifying the office, you may be dismissed from the clinic at the provider's discretion.      For prescription refill requests, have your pharmacy contact our office and allow 72 hours for refills to be completed.    Today you received the following Venofer , return as scheduled.   To help prevent nausea and vomiting after your treatment, we encourage you to take your nausea medication as directed.  BELOW ARE SYMPTOMS THAT SHOULD BE REPORTED IMMEDIATELY: *FEVER GREATER THAN 100.4 F (38 C) OR HIGHER *CHILLS OR SWEATING *NAUSEA AND VOMITING THAT IS NOT CONTROLLED WITH YOUR NAUSEA MEDICATION *UNUSUAL SHORTNESS OF BREATH *UNUSUAL BRUISING OR BLEEDING *URINARY PROBLEMS (pain or burning when urinating, or frequent urination) *BOWEL PROBLEMS (unusual diarrhea, constipation, pain near the anus) TENDERNESS IN MOUTH AND THROAT WITH OR WITHOUT PRESENCE OF ULCERS (sore throat, sores in mouth, or a toothache) UNUSUAL RASH, SWELLING OR PAIN  UNUSUAL VAGINAL DISCHARGE OR ITCHING   Items with * indicate a potential emergency and should be followed up as soon as possible or  go to the Emergency Department if any problems should occur.  Please show the CHEMOTHERAPY ALERT CARD or IMMUNOTHERAPY ALERT CARD at check-in to the Emergency Department and triage nurse.  Should you have questions after your visit or need to cancel or reschedule your appointment, please contact [facility] [place] - A DEPT OF [facility] [phone]  and follow the prompts.  Office hours are 8:00 a.m. to 4:30 p.m. Monday - Friday. Please note that voicemails left after 4:00 p.m. may not be returned until the following business day.  We are closed weekends and major holidays. You have access to a nurse at all times for urgent questions. Please call the main number to the clinic [phone] and follow the prompts.  For any non-urgent questions, you may also contact your provider using MyChart. We now offer e-Visits for anyone 59 and older to request care online for non-urgent symptoms. For details visit [email or web address].   Also download the MyChart app! Go to the app store, search MyChart, open the app, select [facility], and log in with your MyChart username and password.

## 2024-06-21 NOTE — Progress Notes (Signed)
 Patient tolerated iron infusion with no complaints voiced.  Peripheral IV site clean and dry with good blood return noted before and after infusion.  Band aid applied.  VSS with discharge and left in satisfactory condition with no s/s of distress noted.

## 2024-06-26 ENCOUNTER — Ambulatory Visit (INDEPENDENT_AMBULATORY_CARE_PROVIDER_SITE_OTHER): Admitting: *Deleted

## 2024-06-26 ENCOUNTER — Other Ambulatory Visit

## 2024-06-26 DIAGNOSIS — Z23 Encounter for immunization: Secondary | ICD-10-CM

## 2024-06-26 DIAGNOSIS — D649 Anemia, unspecified: Secondary | ICD-10-CM | POA: Diagnosis not present

## 2024-06-26 DIAGNOSIS — D539 Nutritional anemia, unspecified: Secondary | ICD-10-CM | POA: Diagnosis not present

## 2024-06-26 DIAGNOSIS — D631 Anemia in chronic kidney disease: Secondary | ICD-10-CM | POA: Diagnosis not present

## 2024-06-26 DIAGNOSIS — R809 Proteinuria, unspecified: Secondary | ICD-10-CM | POA: Diagnosis not present

## 2024-06-26 DIAGNOSIS — E559 Vitamin D deficiency, unspecified: Secondary | ICD-10-CM | POA: Diagnosis not present

## 2024-06-26 DIAGNOSIS — N1832 Chronic kidney disease, stage 3b: Secondary | ICD-10-CM | POA: Diagnosis not present

## 2024-06-26 DIAGNOSIS — D7589 Other specified diseases of blood and blood-forming organs: Secondary | ICD-10-CM | POA: Diagnosis not present

## 2024-06-26 DIAGNOSIS — E211 Secondary hyperparathyroidism, not elsewhere classified: Secondary | ICD-10-CM | POA: Diagnosis not present

## 2024-06-28 ENCOUNTER — Other Ambulatory Visit: Payer: Self-pay | Admitting: Nurse Practitioner

## 2024-06-28 DIAGNOSIS — E1121 Type 2 diabetes mellitus with diabetic nephropathy: Secondary | ICD-10-CM

## 2024-07-03 DIAGNOSIS — N049 Nephrotic syndrome with unspecified morphologic changes: Secondary | ICD-10-CM | POA: Diagnosis not present

## 2024-07-03 DIAGNOSIS — E871 Hypo-osmolality and hyponatremia: Secondary | ICD-10-CM | POA: Diagnosis not present

## 2024-07-03 DIAGNOSIS — N2581 Secondary hyperparathyroidism of renal origin: Secondary | ICD-10-CM | POA: Diagnosis not present

## 2024-07-03 DIAGNOSIS — I5032 Chronic diastolic (congestive) heart failure: Secondary | ICD-10-CM | POA: Diagnosis not present

## 2024-07-19 ENCOUNTER — Other Ambulatory Visit: Payer: Self-pay | Admitting: Nurse Practitioner

## 2024-07-24 NOTE — Progress Notes (Signed)
 "    Subjective:  Patient ID: Bryan Atkinson, male    DOB: 1956/01/25, 68 y.o.   MRN: [id number]  Patient Care Team: Deitra Morton Sebastian Nena, NP as PCP - General (Nurse Practitioner) Shaaron Lamar HERO, MD as Consulting Physician (Gastroenterology)   Chief Complaint:  Medical Management of Chronic Issues   HPI: Bryan Atkinson is a 68 y.o. male presenting on 07/25/2024 for Medical Management of Chronic Issues   Discussed the use of AI scribe software for clinical note transcription with the patient, who gave verbal consent to proceed.  History of Present Illness Bryan Atkinson is a 68 year old male with chronic kidney disease and diabetes who presents for follow-up and management of his conditions.  He has chronic kidney disease with a GFR of 30-31 as of May 31, 2024. Recent iron  studies showed low iron  saturation, leading to two iron  infusions administered one week apart. He experienced dizziness when his iron  levels were low and continues to take over-the-counter iron  supplements every other day.  He has diabetes with a last recorded A1c of 7.0 on June 23, 2024. He takes Farxiga 5 mg daily, glipizide  10 mg daily and  and monitors his blood sugar levels, noting fasting levels between 180-190 mg/dL in the mornings, which decrease to around 160 mg/dL later in the day.  He manages hypertension with losartan 25 mg daily and Lasix  25 mg twice a day. His home blood pressure readings average 113/72 mmHg, with today's reading at 133/80 mmHg.  He has hyperlipidemia and is on pravastatin  40 mg daily. No recent gout attacks are reported, and he continues to take allopurinol . He is also on pantoprazole  for gastrointestinal issues, taking it once daily.  He is on Synthroid  75 mg for thyroid  management, with the last thyroid  check over a year ago.  He reports sleeping well and continues to take trazodone 50 mg for sleep. He checks his feet daily as part of his diabetes  management.  He has history of chronic CHF and currently under the care of cardiology he is remaining managed with Lasix  daily and coreg. Reports some rare episodes of cough which he attributed to his allergy. Denies SOB, Chest pain.  Relevant past medical, surgical, family, and social history reviewed and updated as indicated.  Allergies and medications reviewed and updated. Data reviewed: Chart in Epic.   Past Medical History:  Diagnosis Date   Anemia    Arthritis    Diabetes mellitus without complication (HCC)    GERD (gastroesophageal reflux disease)    Gout    History of kidney stones    Hyperlipidemia    Hypertension     Past Surgical History:  Procedure Laterality Date   BIOPSY  05/25/2020   Procedure: BIOPSY;  Surgeon: Shaaron Lamar HERO, MD;  [facility]: [facility];  Service: Endoscopy;;   BIOPSY  03/25/2022   Procedure: BIOPSY;  Surgeon: Eartha Angelia Sieving, MD;  [facility]: [facility];  Service: Gastroenterology;;   BIOPSY  06/15/2022   Procedure: BIOPSY;  Surgeon: Shaaron Lamar HERO, MD;  [facility]: [facility];  Service: Endoscopy;;   CATARACT EXTRACTION W/PHACO Right 05/13/2019   Procedure: CATARACT EXTRACTION PHACO AND INTRAOCULAR LENS PLACEMENT (IOC);  Surgeon: Harrie Lynwood, MD;  Location: [facility];  Service: Ophthalmology;  Laterality: Right;  CDE: 10.31   CATARACT EXTRACTION W/PHACO Left 05/27/2019   Procedure: CATARACT EXTRACTION PHACO AND INTRAOCULAR LENS PLACEMENT (IOC);  Surgeon: Harrie Lynwood, MD;  Location: [facility];  Service: Ophthalmology;  Laterality: Left;  CDE: 6.49   COLONOSCOPY     times two 2002 ,2007   COLONOSCOPY N/A 10/07/2013   Procedure: COLONOSCOPY;  Surgeon: Lamar CHRISTELLA Hollingshead, MD;  [facility]: [facility];  Service: Endoscopy;  Laterality: N/A;  8:30 AM   COLONOSCOPY N/A 10/05/2018   Dr. Hollingshead: Diverticulosis.  Next colonoscopy 5 years due to first-degree relative with colon cancer   COLONOSCOPY WITH PROPOFOL  N/A 09/27/2023   Procedure:  COLONOSCOPY WITH PROPOFOL ;  Surgeon: Hollingshead Lamar CHRISTELLA, MD;  [facility];  Service: Endoscopy;  Laterality: N/A;  12:30pm, asa 3   ESOPHAGOGASTRODUODENOSCOPY (EGD) WITH PROPOFOL  N/A 05/25/2020   [name]: 2 gastric ulcers.  No H. pylori.  Likely NSAID related.  Repeat EGD in 3 months   ESOPHAGOGASTRODUODENOSCOPY (EGD) WITH PROPOFOL  N/A 09/17/2020   Procedure: ESOPHAGOGASTRODUODENOSCOPY (EGD) WITH PROPOFOL ;  Surgeon: Hollingshead Lamar CHRISTELLA, MD;  [facility];  Service: Endoscopy;  Laterality: N/A;  10:00am   ESOPHAGOGASTRODUODENOSCOPY (EGD) WITH PROPOFOL  N/A 03/25/2022   Procedure: ESOPHAGOGASTRODUODENOSCOPY (EGD) WITH PROPOFOL ;  Surgeon: Eartha Angelia Sieving, MD;  [facility];  Service: Gastroenterology;  Laterality: N/A;   ESOPHAGOGASTRODUODENOSCOPY (EGD) WITH PROPOFOL  N/A 06/15/2022   Procedure: ESOPHAGOGASTRODUODENOSCOPY (EGD) WITH PROPOFOL ;  Surgeon: Hollingshead Lamar CHRISTELLA, MD;  [facility];  Service: Endoscopy;  Laterality: N/A;  7:30am   JOINT REPLACEMENT     KNEE ARTHROPLASTY Left 06/27/2019   Procedure: COMPUTER ASSISTED TOTAL KNEE ARTHROPLASTY;  Surgeon: Fidel Rogue, MD;  Location: [facility];  Service: Orthopedics;  Laterality: Left;   KNEE ARTHROPLASTY Right 08/15/2019   Procedure: COMPUTER ASSISTED TOTAL KNEE ARTHROPLASTY;  Surgeon: Fidel Rogue, MD;  Location: [facility];  Service: Orthopedics;  Laterality: Right;   LEG SURGERY Right early 80's   POLYPECTOMY  09/27/2023   Procedure: POLYPECTOMY;  Surgeon: Hollingshead Lamar CHRISTELLA, MD;  [facility]: [facility];  Service: Endoscopy;;    Social History   Socioeconomic History   Marital status: Married    Spouse name: Not on file   Number of children: 2   Years of education: Not on file   Highest education level: Never attended school  Occupational History   Occupation: Truck Hospital Doctor    Comment: runs a designer, television/film set as well, [employer] in [place]  Tobacco Use   Smoking status: Never   Smokeless tobacco: Never   Vaping Use   Vaping status: Never Used  Substance and Sexual Activity   Alcohol  use: Yes    Comment: occasionally   Drug use: No   Sexual activity: Yes  Other Topics Concern   Not on file  Social History Narrative   Daughter is 79, Son is 29.  3 grandchildren. Married for 34 years   Social Drivers of Corporate Investment Banker Strain: Low Risk  (04/21/2024)   Overall Financial Resource Strain (CARDIA)    Difficulty of Paying Living Expenses: Not hard at all  Food Insecurity: No Food Insecurity (04/21/2024)   Hunger Vital Sign    Worried About Running Out of Food in the Last Year: Never true    Ran Out of Food in the Last Year: Never true  Transportation Needs: No Transportation Needs (04/21/2024)   PRAPARE - Administrator, Civil Service (Medical): No    Lack of Transportation (Non-Medical): No  Physical Activity: Unknown (04/21/2024)   Exercise Vital Sign    Days of Exercise per Week: 1 day    Minutes of Exercise per Session: Patient declined  Stress:  No Stress Concern Present (04/21/2024)   [employer] of Occupational Health - Occupational Stress Questionnaire    Feeling of Stress: Not at all  Social Connections: Moderately Isolated (04/21/2024)   Social Connection and Isolation Panel    Frequency of Communication with Friends and Family: Twice a week    Frequency of Social Gatherings with Friends and Family: Once a week    Attends Religious Services: Patient declined    Database Administrator or Organizations: No    Attends Engineer, Structural: Not on file    Marital Status: Married  Intimate Partner Violence: Not on file    Outpatient Encounter Medications as of 07/25/2024  Medication Sig   acetaminophen  (TYLENOL ) 500 MG tablet Take 500 mg by mouth every 6 (six) hours as needed for mild pain.   allopurinol  (ZYLOPRIM ) 300 MG tablet TAKE 1 TABLET BY MOUTH EVERY DAY   amLODipine  (NORVASC ) 5 MG tablet Take 0.5 tablets (2.5 mg total) by mouth daily.    Apple Cider Vinegar 500 MG TABS Take 500 mg by mouth daily.    aspirin  EC 81 MG tablet Take 81 mg by mouth daily. Swallow whole.   carvedilol (COREG) 25 MG tablet Take 25 mg by mouth 2 (two) times daily.   cholecalciferol (VITAMIN D3) 25 MCG (1000 UNIT) tablet Take 1,000 Units by mouth daily.   Co-Enzyme Q10 100 MG CAPS Take 100 mg by mouth daily.    Cranberry 500 MG TABS Take 500 mg by mouth daily.   FARXIGA 5 MG TABS tablet Take 5 mg by mouth every morning.   fenofibrate  (TRICOR ) 145 MG tablet TAKE 1 TABLET BY MOUTH EVERY DAY   ferrous sulfate 325 (65 FE) MG tablet Take 325 mg by mouth daily with breakfast.   Flaxseed, Linseed, (FLAXSEED OIL) 1000 MG CAPS Take 1,000 mg by mouth daily.   furosemide  (LASIX ) 20 MG tablet Take 1 tablet (20 mg total) by mouth 2 (two) times daily.   glipiZIDE  (GLUCOTROL  XL) 10 MG 24 hr tablet TAKE 1 TABLET (10 MG TOTAL) BY MOUTH DAILY WITH BREAKFAST.   glucose blood test strip 1 each by Other route as needed for other. Use as instructed   hydrALAZINE (APRESOLINE) 100 MG tablet Take 50 mg by mouth 2 (two) times daily.   Krill Oil 500 MG CAPS Take 500 mg by mouth 2 (two) times a day.   levothyroxine  (SYNTHROID ) 75 MCG tablet TAKE 1 TABLET BY MOUTH DAILY BEFORE BREAKFAST.   losartan (COZAAR) 25 MG tablet Take 12.5 mg by mouth daily.   montelukast  (SINGULAIR ) 10 MG tablet TAKE 1 TABLET BY MOUTH EVERYDAY AT BEDTIME   Multiple Vitamin (MULTIVITAMIN) tablet Take 1 tablet by mouth daily.   mupirocin  cream (BACTROBAN ) 2 % Apply 1 Application topically 2 (two) times daily.   niacin  (NIASPAN ) 1000 MG CR tablet TAKE 1 TABLET (1,000 MG TOTAL) BY MOUTH EVERY EVENING.   pantoprazole  (PROTONIX ) 40 MG tablet Take 1 tablet (40 mg total) by mouth 2 (two) times daily before a meal.   pravastatin  (PRAVACHOL ) 40 MG tablet Take 1 tablet (40 mg total) by mouth at bedtime.   traZODone (DESYREL) 50 MG tablet Take 50 mg by mouth at bedtime.    Turmeric 500 MG CAPS Take 500 mg by mouth  daily.   No facility-administered encounter medications on file as of 07/25/2024.    No Known Allergies  Pertinent ROS per HPI, otherwise unremarkable      Objective:  BP 133/80  Pulse 83   Temp (!) 96.8 F (36 C)   Ht 5' 9 (1.753 m)   Wt 257 lb 9.6 oz (116.8 kg)   SpO2 95%   BMI 38.04 kg/m    Wt Readings from Last 3 Encounters:  07/25/24 257 lb 9.6 oz (116.8 kg)  04/24/24 258 lb 9.6 oz (117.3 kg)  01/24/24 253 lb 12.8 oz (115.1 kg)    Physical Exam Vitals and nursing note reviewed.  Constitutional:      General: He is not in acute distress. HENT:     Head: Normocephalic and atraumatic.     Nose: Nose normal.     Mouth/Throat:     Mouth: Mucous membranes are moist.  Eyes:     Pupils: Pupils are equal, round, and reactive to light.  Pulmonary:     Effort: Pulmonary effort is normal.     Breath sounds: Normal breath sounds.  Abdominal:     Palpations: Abdomen is soft.  Musculoskeletal:        General: Normal range of motion.     Right lower leg: No edema.     Left lower leg: No edema.  Skin:    General: Skin is warm and dry.  Neurological:     Mental Status: He is alert and oriented to person, place, and time.  Psychiatric:        Mood and Affect: Mood normal.        Behavior: Behavior normal.        Thought Content: Thought content normal.        Judgment: Judgment normal.    Physical Exam VITALS: BP- 133/80     Results for orders placed or performed in visit on 05/31/24  Basic metabolic panel   Collection Time: 05/31/24 10:45 AM  Result Value Ref Range   Sodium 134 (L) 135 - 145 mmol/L   Potassium 4.0 3.5 - 5.1 mmol/L   Chloride 101 98 - 111 mmol/L   CO2 22 22 - 32 mmol/L   Glucose, Bld 156 (H) 70 - 99 mg/dL   BUN 41 (H) 8 - 23 mg/dL   Creatinine, Ser 7.72 (H) 0.61 - 1.24 mg/dL   Calcium  9.9 8.9 - 10.3 mg/dL   GFR, Estimated 31 (L) >60 mL/min   Anion gap 11 5 - 15  CBC with Differential/Platelet   Collection Time: 05/31/24 10:45 AM   Result Value Ref Range   WBC 10.8 (H) 4.0 - 10.5 K/uL   RBC 4.14 (L) 4.22 - 5.81 MIL/uL   Hemoglobin 12.8 (L) 13.0 - 17.0 g/dL   HCT 61.6 (L) 60.9 - 47.9 %   MCV 92.5 80.0 - 100.0 fL   MCH 30.9 26.0 - 34.0 pg   MCHC 33.4 30.0 - 36.0 g/dL   RDW 86.0 88.4 - 84.4 %   Platelets 324 150 - 400 K/uL   nRBC 0.0 0.0 - 0.2 %   Neutrophils Relative % 68 %   Neutro Abs 7.4 1.7 - 7.7 K/uL   Lymphocytes Relative 17 %   Lymphs Abs 1.8 0.7 - 4.0 K/uL   Monocytes Relative 10 %   Monocytes Absolute 1.0 0.1 - 1.0 K/uL   Eosinophils Relative 3 %   Eosinophils Absolute 0.3 0.0 - 0.5 K/uL   Basophils Relative 1 %   Basophils Absolute 0.1 0.0 - 0.1 K/uL   Immature Granulocytes 1 %   Abs Immature Granulocytes 0.14 (H) 0.00 - 0.07 K/uL  Iron  and TIBC   Collection Time:  05/31/24 10:46 AM  Result Value Ref Range   Iron  53 45 - 182 ug/dL   TIBC 663 749 - 549 ug/dL   Saturation Ratios 16 (L) 17.9 - 39.5 %   UIBC 283 ug/dL  Ferritin   Collection Time: 05/31/24 10:46 AM  Result Value Ref Range   Ferritin 232 24 - 336 ng/mL       Pertinent labs & imaging results that were available during my care of the patient were reviewed by me and considered in my medical decision making.  Assessment & Plan:  Demetris was seen today for medical management of chronic issues.  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 Morning glucose levels high, decrease later. Advised against using Trulicity from wife's supply due to kidney function concerns. - Check A1c today. - Continue Farxiga and Glipizide . - Do not use Trulicity from wife's supply. - Diabetic foot exam completed today  Chronic kidney disease, stage 4 Stage 4 CKD with eGFR 30-31. Managed by nephrology. Iron  deficiency anemia treated with iron  supplementation. - Continue current nephrology care.  Essential hypertension Hypertension managed with Losartan and Lasix . Home BP averages 113/72, today's 133/80. - Continue current antihypertensive regimen.  Iron  deficiency anemia Managed with iron  supplementation. Recent iron  infusions for low saturation. - Continue over-the-counter iron  supplementation every other day.  Hyperlipidemia Managed with Pravastatin  40 mg daily. - Continue Pravastatin  40 mg daily.  Hypothyroidism Managed with Levothyroxine  75 mcg daily. Last thyroid  test over a year ago. - Check thyroid  function today. - Adjust Levothyroxine  dosage if necessary based on test results.  Gastroesophageal reflux disease GERD managed with Pantoprazole , taken once daily instead of twice. - Continue Pantoprazole  once daily as tolerated.  Chronic CHF Continue current care with cardiology; Lasix  daily    Continue all other maintenance medications.  Follow up plan: Return in about 3 months (around 10/25/2024) for Chronic Diseases Management.   Continue healthy lifestyle choices, including diet (rich in fruits, vegetables, and lean proteins, and low in salt and simple carbohydrates) and exercise (at least 30 minutes of moderate physical activity daily).  Educational handout given for    Clinical References  Chronic Kidney Disease in Adults: What to Know Chronic kidney disease (CKD) is when lasting damage happens to the  kidneys slowly over time. The kidneys are two organs that do many important things in the body. These include: Taking waste and extra fluid out of the blood to make pee (urine). Making hormones. Keeping the right amount of fluids and chemicals in the body. A small amount of kidney damage may not cause problems. You must take steps to help keep the kidney damage from getting worse. A lot of damage may cause kidney failure. Kidney failure means the kidneys can no longer work right. What are the causes? Diabetes. High blood pressure. Diseases that affect the  heart and blood vessels. Other kidney diseases. Diseases that affect the body's defense system (immune system). A problem with the flow of pee. This may be caused by: Kidney stones. Cancer. An enlarged prostate, in males. A kidney infection or urinary tract infection (UTI) that keeps coming back. What increases the risk? Getting older. The chances of having CKD increase with age. A family history of kidney disease or kidney failure. Having a disease caused by genes. Taking medicines that can harm the kidneys. Being near or having contact with harmful substances. Being very overweight. Using tobacco now or in the past. What are the signs or symptoms? Common symptoms of CKD include: Feeling very tired and having less energy. Swelling of the face, legs, ankles, or feet. Throwing up or feeling like you may throw up. Not wanting to eat as much as normal. Being confused or not able to focus. Twitches and cramps in the leg muscles or other muscles. Dry, itchy skin. Other symptoms may include: Shortness of breath. Trouble sleeping. Making less pee, or making more pee, especially at night. A taste of metal in your mouth. You may also become anemic. Anemia means there's not enough red blood cells in your blood. You may get symptoms slowly. You may not notice them until the kidney damage gets very bad. How is this diagnosed? CKD may be  diagnosed based on: Tests on your blood or pee. Imaging tests, like an ultrasound or a CT scan. A kidney biopsy. For this test, a sample of kidney tissue is removed to be looked at under a microscope. These tests will help to find out how serious the CKD is. How is this treated? Often, there's no cure for CKD. Treatment can help with symptoms and help keep the disease from getting worse. Treatment may include: Treating other problems that are causing your CKD or making it worse. Diet changes. You may need to: Avoid alcohol . Avoid foods that are high in salt, potassium, phosphorous, and protein. Taking medicines for symptoms and to help control other conditions. Dialysis. This treatment gets harmful waste out of your body. It may be needed if you have kidney failure. Follow these instructions at home: Medicines Take your medicines only as told. The amount of some medicines you take may need to be changed. Do not take any new medicines, vitamins, or supplements unless your health care provider says it's okay. These may make kidney damage worse. Lifestyle Do not smoke, vape, or use nicotine or tobacco. If you drink alcohol : Limit how much you have to: 0-1 drink a day if you're male. 0-2 drinks a day if you're male. Know how much alcohol  is in your drink. In the [place] one drink is one 12 oz bottle of beer (355 mL), one 5 oz glass of wine (148 mL), or one 1 oz glass of hard liquor (44 mL). Stay at a healthy weight. If you need help, ask your provider. General instructions  Eat and drink as told. Track your blood pressure at home. Tell your provider about any changes. If you have diabetes, track your blood sugar as told. Exercise at least 30 minutes a day, 5 days a week. Keep your shots (vaccinations) up to date. Keep all follow-up visits. Your provider may need to change your treatments over time. Where to find support American Kidney Fund: [place].com.au Kidney School:  kidneyschool.org American Association of Kidney Patients: https://www.miller-montoya.com/ Where to find more information National Kidney Foundation: kidney.org Centers for Disease Control and Prevention. To learn more: Go  to Diningcalendar.de. Click Search. Type chronic kidney disease in the search box. Contact a health care provider if: You have new symptoms. You get symptoms of end-stage kidney disease. These include: Headaches. Numbness in your hands or feet. Leg cramps. Easy bruising. Get help right away if: You have a fever. You make less pee than usual. You have pain or bleeding when you pee or poop. You have chest pain. You have shortness of breath. These symptoms may be an emergency. Call 911 right away. Do not wait to see if the symptoms will go away. Do not drive yourself to the hospital. This information is not intended to replace advice given to you by your health care provider. Make sure you discuss any questions you have with your health care provider. Document Revised: 08/15/2023 Document Reviewed: 04/07/2023 Elsevier Patient Education  2024 Elsevier Inc. Hypertension, Adult Hypertension is another name for high blood pressure. High blood pressure forces your heart to work harder to pump blood. This can cause problems over time. There are two numbers in a blood pressure reading. There is a top number (systolic) over a bottom number (diastolic). It is best to have a blood pressure that is below 120/80. What are the causes? The cause of this condition is not known. Some other conditions can lead to high blood pressure. What increases the risk? Some lifestyle factors can make you more likely to develop high blood pressure: Smoking. Not getting enough exercise or physical activity. Being overweight. Having too much fat, sugar, calories, or salt (sodium) in your diet. Drinking too much alcohol . Other risk factors include: Having any of these conditions: Heart disease. Diabetes. High  cholesterol. Kidney disease. Obstructive sleep apnea. Having a family history of high blood pressure and high cholesterol. Age. The risk increases with age. Stress. What are the signs or symptoms? High blood pressure may not cause symptoms. Very high blood pressure (hypertensive crisis) may cause: Headache. Fast or uneven heartbeats (palpitations). Shortness of breath. Nosebleed. Vomiting or feeling like you may vomit (nauseous). Changes in how you see. Very bad chest pain. Feeling dizzy. Seizures. How is this treated? This condition is treated by making healthy lifestyle changes, such as: Eating healthy foods. Exercising more. Drinking less alcohol . Your doctor may prescribe medicine if lifestyle changes do not help enough and if: Your top number is above 130. Your bottom number is above 80. Your personal target blood pressure may vary. Follow these instructions at home: Eating and drinking  If told, follow the DASH eating plan. To follow this plan: Fill one half of your plate at each meal with fruits and vegetables. Fill one fourth of your plate at each meal with whole grains. Whole grains include whole-wheat pasta, brown rice, and whole-grain bread. Eat or drink low-fat dairy products, such as skim milk or low-fat yogurt. Fill one fourth of your plate at each meal with low-fat (lean) proteins. Low-fat proteins include fish, chicken without skin, eggs, beans, and tofu. Avoid fatty meat, cured and processed meat, or chicken with skin. Avoid pre-made or processed food. Limit the amount of salt in your diet to less than 1,500 mg each day. Do not drink alcohol  if: Your doctor tells you not to drink. You are pregnant, may be pregnant, or are planning to become pregnant. If you drink alcohol : Limit how much you have to: 0-1 drink a day for women. 0-2 drinks a day for men. Know how much alcohol  is in your drink. In the [place] one drink equals one 12  oz bottle of beer (355 mL),  one 5 oz glass of wine (148 mL), or one 1 oz glass of hard liquor (44 mL). Lifestyle  Work with your doctor to stay at a healthy weight or to lose weight. Ask your doctor what the best weight is for you. Get at least 30 minutes of exercise that causes your heart to beat faster (aerobic exercise) most days of the week. This may include walking, swimming, or biking. Get at least 30 minutes of exercise that strengthens your muscles (resistance exercise) at least 3 days a week. This may include lifting weights or doing Pilates. Do not smoke or use any products that contain nicotine or tobacco. If you need help quitting, ask your doctor. Check your blood pressure at home as told by your doctor. Keep all follow-up visits. Medicines Take over-the-counter and prescription medicines only as told by your doctor. Follow directions carefully. Do not skip doses of blood pressure medicine. The medicine does not work as well if you skip doses. Skipping doses also puts you at risk for problems. Ask your doctor about side effects or reactions to medicines that you should watch for. Contact a doctor if: You think you are having a reaction to the medicine you are taking. You have headaches that keep coming back. You feel dizzy. You have swelling in your ankles. You have trouble with your vision. Get help right away if: You get a very bad headache. You start to feel mixed up (confused). You feel weak or numb. You feel faint. You have very bad pain in your: Chest. Belly (abdomen). You vomit more than once. You have trouble breathing. These symptoms may be an emergency. Get help right away. Call 911. Do not wait to see if the symptoms will go away. Do not drive yourself to the hospital. Summary Hypertension is another name for high blood pressure. High blood pressure forces your heart to work harder to pump blood. For most people, a normal blood pressure is less than 120/80. Making healthy choices can  help lower blood pressure. If your blood pressure does not get lower with healthy choices, you may need to take medicine. This information is not intended to replace advice given to you by your health care provider. Make sure you discuss any questions you have with your health care provider. Document Revised: 07/22/2021 Document Reviewed: 07/22/2021 Elsevier Patient Education  2024 [employer] GERD in Adults: Diet Changes When you have gastroesophageal reflux disease (GERD), you may need to make changes to your diet. Choosing the right foods can help with your symptoms. Think about working with an expert in healthy eating called a dietitian. They can help you make healthy food choices. What are tips for following this plan? Reading food labels Look for foods that are low in saturated fat. Foods that may help with your symptoms include: Foods with less than 5% of daily value (DV) of fat. Foods with 0 grams of trans fat. Cooking [employer] in ways that don't use a lot of fat. These ways include: Baking. Steaming. Grilling. Broiling. To add flavor, try to use herbs that are low in spice and acidity. Avoid frying your food. Meal planning  Eat small meals often rather than eating 3 large meals each day. Eat your meals slowly in a place where you feel relaxed. If told by your health care provider, avoid: Foods that cause symptoms. Keep a food diary to keep track of foods that cause symptoms. Alcohol . Drinking a lot  of liquid with meals. General instructions For 2-3 hours after you eat, avoid: Bending over. Exercise. Lying down. Chew sugar-free gum after meals. What foods should I eat? Eat a healthy diet. Try to include: Foods with high amounts of fiber. These include: Fruits and vegetables. Whole grains and beans. Low-fat dairy products. Lean meats, fish, and poultry. Egg whites. Foods that cause symptoms in someone else may not cause symptoms for you. Work with your  provider to find foods that are safe for you. The items listed above may not be all the foods and drinks you can have. Talk with a dietitian to learn more. The items listed above may not be a complete list of foods and beverages you can eat and drink. Contact a dietitian for more information. What foods should I avoid? Limiting some of these foods may help with your symptoms. Each person is different. Talk with a dietitian or your provider to help you find the exact foods to avoid. Some of the foods to avoid may include: Fruits Fruits with a lot of acid in them. These may include citrus fruits, such as oranges, grapefruit, pineapple, and lemons. Vegetables Deep-fried vegetables, such as French fries. Vegetables, sauces, or toppings made with added fat and vegetables with acid in them. These may include tomatoes and tomato products, chili peppers, onions, garlic, and horseradish. Grains Pastries or quick breads with added fat. Meats and other proteins High-fat meats, such as fatty beef or pork, hot dogs, ribs, ham, sausage, salami, and bacon. Fried meat or protein, such as fried fish and fried chicken. Egg yolks. Fats and oils Butter. Margarine. Shortening. Ghee. Drinks Coffee and other drinks with caffeine in them. Fizzy and sugary drinks, such as soda and energy drinks. Fruit juice made with acidic fruits, such as orange or grapefruit. Tomato juice. Sweets and desserts Chocolate and cocoa. Donuts. Seasonings and condiments Mint, such as peppermint and spearmint. Condiments, herbs, or seasonings that cause symptoms. These may include curry, hot sauce, or vinegar-based salad dressings. The items listed above may not be all the foods and drinks you should avoid. Talk with a dietitian to learn more. Questions to ask your health care provider Changes to your diet and everyday life are often the first steps taken to manage symptoms of GERD. If these changes don't help, talk with your provider  about taking medicines. Where to find more information [employer]: aboutgerd.org This information is not intended to replace advice given to you by your health care provider. Make sure you discuss any questions you have with your health care provider. Document Revised: 08/15/2023 Document Reviewed: 03/01/2023 Elsevier Patient Education  2024 [employer] Hypothyroidism  Hypothyroidism is when the thyroid  gland does not make enough of certain hormones. This is called an underactive thyroid . The thyroid  gland is a small gland located in the lower front part of the neck, just in front of the windpipe (trachea). This gland makes hormones that help control how the body uses food for energy (metabolism) as well as how the heart and brain function. These hormones also play a role in keeping your bones strong. When the thyroid  is underactive, it produces too little of the hormones thyroxine (T4) and triiodothyronine (T3). What are the causes? This condition may be caused by: Hashimoto's disease. This is a disease in which the body's disease-fighting system (immune system) attacks the thyroid  gland. This is the most common cause. Viral infections. Pregnancy. Certain medicines. Birth defects. Problems with a gland in the  center of the brain (pituitary gland). Lack of enough iodine  in the diet. Other causes may include: Past radiation treatments to the head or neck for cancer. Past treatment with radioactive iodine . Past exposure to radiation in the environment. Past surgical removal of part or all of the thyroid . What increases the risk? You are more likely to develop this condition if: You are male. You have a family history of thyroid  conditions. You use a medicine called lithium. You take medicines that affect the immune system (immunosuppressants). What are the signs or symptoms? Common symptoms of this condition include: Not being able to  tolerate cold. Feeling as though you have no energy (lethargy). Lack of appetite. Constipation. Sadness or depression. Weight gain that is not explained by a change in diet or exercise habits. Menstrual irregularity. Dry skin, coarse hair, or brittle nails. Other symptoms may include: Muscle pain. Slowing of thought processes. Poor memory. How is this diagnosed? This condition may be diagnosed based on: Your symptoms, your medical history, and a physical exam. Blood tests. You may also have imaging tests, such as an ultrasound or MRI. How is this treated? This condition is treated with medicine that replaces the thyroid  hormones that your body does not make. After you begin treatment, it may take several weeks for symptoms to go away. Follow these instructions at home: Take over-the-counter and prescription medicines only as told by your health care provider. If you start taking any new medicines, tell your health care provider. Keep all follow-up visits as told by your health care provider. This is important. As your condition improves, your dosage of thyroid  hormone medicine may change. You will need to have blood tests regularly so that your health care provider can monitor your condition. Contact a health care provider if: Your symptoms do not get better with treatment. You are taking thyroid  hormone replacement medicine and you: Sweat a lot. Have tremors. Feel anxious. Lose weight rapidly. Cannot tolerate heat. Have emotional swings. Have diarrhea. Feel weak. Get help right away if: You have chest pain. You have an irregular heartbeat. You have a rapid heartbeat. You have difficulty breathing. These symptoms may be an emergency. Get help right away. Call 911. Do not wait to see if the symptoms will go away. Do not drive yourself to the hospital. Summary Hypothyroidism is when the thyroid  gland does not make enough of certain hormones (it is underactive). When the  thyroid  is underactive, it produces too little of the hormones thyroxine (T4) and triiodothyronine (T3). The most common cause is Hashimoto's disease, a disease in which the body's disease-fighting system (immune system) attacks the thyroid  gland. The condition can also be caused by viral infections, medicine, pregnancy, or past radiation treatment to the head or neck. Symptoms may include weight gain, dry skin, constipation, feeling as though you do not have energy, and not being able to tolerate cold. This condition is treated with medicine to replace the thyroid  hormones that your body does not make. This information is not intended to replace advice given to you by your health care provider. Make sure you discuss any questions you have with your health care provider. Document Revised: 10/05/2021 Document Reviewed: 10/05/2021 Elsevier Patient Education  2024 [employer] Inc. Peptic Ulcer  A peptic ulcer is a painful sore in the lining of your stomach or the first part of your small intestine. What are the causes? Common causes of this condition include: An infection. Using certain pain medicines too often or too much. Rare  tumors in the stomach, small intestine, or pancreas. What increases the risk? You are more likely to get this condition if you: Smoke. Have a family history of ulcer disease. Drink alcohol . Have been hospitalized in an intensive care unit (ICU). What are the signs or symptoms? Symptoms include: Burning pain in the area between the chest and the belly button. The pain may: Not go away (be persistent). Be worse when your stomach is empty. Be worse at night. Heartburn. Feeling sick to your stomach (nauseous) and throwing up (vomiting). Bloating. If the ulcer results in bleeding, it can cause you to: Have poop (stool) that is black and looks like tar. Throw up bright red blood. Throw up material that looks like coffee grounds. How is this treated? Treatment for this  condition may include: Stopping things that can cause the ulcer, such as: Smoking. Using pain medicines. Drinking alcohol  or caffeine. Medicines to reduce stomach acid. Antibiotic medicines if the ulcer is caused by an infection. A procedure that is done using a small, flexible tube that has a camera at the end (upper endoscopy). This may be done if you have a bleeding ulcer. Surgery. This may be needed if: You have a lot of bleeding. The ulcer caused a hole somewhere in the digestive system. Follow these instructions at home: Do not drink alcohol  if your doctor tells you not to drink. Limit how much caffeine you take in. Do not smoke or use any products that contain nicotine or tobacco. If you need help quitting, ask your doctor. Take over-the-counter and prescription medicines only as told by your doctor. Do not stop or change your medicines unless you talk with your doctor about it first. Do not take aspirin , ibuprofen, or other NSAIDs unless your doctor told you to do so. Keep all follow-up visits. Contact a doctor if: You do not get better in 7 days after you start treatment. You keep having an upset stomach (indigestion) or heartburn. Get help right away if: You have sudden, sharp pain in your belly (abdomen). You have belly pain that does not go away. You have bloody poop (stool) or black, tarry poop. You throw up blood. It may look like coffee grounds. You feel light-headed or feel like you may pass out (faint). You get weak. You get sweaty or feel sticky and cold to the touch (clammy). These symptoms may be an emergency. Get help right away. Call 911. Do not wait to see if the symptoms will go away. Do not drive yourself to the hospital. Summary Symptoms of a peptic ulcer include burning pain in the area between the chest and the belly button. Do not smoke or use any products that contain nicotine or tobacco. If you need help quitting, ask your doctor. Take medicines only  as told by your doctor. Limit how much alcohol  and caffeine you have. Keep all follow-up visits. This information is not intended to replace advice given to you by your health care provider. Make sure you discuss any questions you have with your health care provider. Document Revised: 05/13/2021 Document Reviewed: 05/14/2021 Elsevier Patient Education  2024 [employer] Inc. Gout  Gout is painful swelling of your joints. Gout is a type of arthritis. It is caused by having too much uric acid in your body. Uric acid is a chemical that is made when your body breaks down substances called purines. If your body has too much uric acid, sharp crystals can form and build up in your joints. This causes pain  and swelling. Gout attacks can happen quickly and be very painful (acute gout). Over time, the attacks can affect more joints and happen more often (chronic gout). What are the causes? Gout is caused by too much uric acid in your blood. This can happen because: Your kidneys do not remove enough uric acid from your blood. Your body makes too much uric acid. You eat too many foods that are high in purines. These foods include organ meats, some seafood, and beer. Trauma or stress can bring on an attack. What increases the risk? Having a family history of gout. Being male and middle-aged. Being male and having gone through menopause. Having an organ transplant. Taking certain medicines. Having certain conditions, such as: Being very overweight (obese). Lead poisoning. Kidney disease. A skin condition called psoriasis. Other risks include: Losing weight too quickly. Not having enough water  in the body (being dehydrated). Drinking alcohol , especially beer. Drinking beverages that are sweetened with a type of sugar called fructose. What are the signs or symptoms? An attack of acute gout often starts at night and usually happens in just one joint. The most common place is the big toe. Other joints  that may be affected include joints of the feet, ankle, knee, fingers, wrist, or elbow. Symptoms may include: Very bad pain. Warmth. Swelling. Stiffness. Tenderness. The affected joint may be very painful to touch. Shiny, red, or purple skin. Chills and fever. Chronic gout may cause symptoms more often. More joints may be involved. You may also have white or yellow lumps (tophi) on your hands or feet or in other areas near your joints. How is this treated? Treatment for an acute attack may include medicines for pain and swelling, such as: NSAIDs, such as ibuprofen. Steroids taken by mouth or injected into a joint. Colchicine. This can be given by mouth or through an IV tube. Treatment to prevent future attacks may include: Taking small doses of NSAIDs or colchicine daily. Using a medicine that reduces uric acid levels in your blood, such as allopurinol . Making changes to your diet. You may need to see a food expert (dietitian) about what to eat and drink to prevent gout. Follow these instructions at home: During a gout attack  If told, put ice on the painful area. To do this: Put ice in a plastic bag. Place a towel between your skin and the bag. Leave the ice on for 20 minutes, 2-3 times a day. Take off the ice if your skin turns bright red. This is very important. If you cannot feel pain, heat, or cold, you have a greater risk of damage to the area. Raise the painful joint above the level of your heart as often as you can. Rest the joint as much as possible. If the joint is in your leg, you may be given crutches. Follow instructions from your doctor about what you cannot eat or drink. Avoiding future gout attacks Eat a low-purine diet. Avoid foods and drinks such as: Liver. Kidney. Anchovies. Asparagus. Herring. Mushrooms. Mussels. Beer. Stay at a healthy weight. If you want to lose weight, talk with your doctor. Do not lose weight too fast. Start or continue an exercise plan  as told by your doctor. Eating and drinking Avoid drinks sweetened by fructose. Drink enough fluids to keep your pee (urine) pale yellow. If you drink alcohol : Limit how much you have to: 0-1 drink a day for women who are not pregnant. 0-2 drinks a day for men. Know how much alcohol   is in a drink. In the [place] one drink equals one 12 oz bottle of beer (355 mL), one 5 oz glass of wine (148 mL), or one 1 oz glass of hard liquor (44 mL). General instructions Take over-the-counter and prescription medicines only as told by your doctor. Ask your doctor if you should avoid driving or using machines while you are taking your medicine. Return to your normal activities when your doctor says that it is safe. Keep all follow-up visits. Where to find more information [employer]: www.niams.http://www.myers.net/ Contact a doctor if: You have another gout attack. You still have symptoms of a gout attack after 10 days of treatment. You have problems (side effects) because of your medicines. You have chills or a fever. You have burning pain when you pee (urinate). You have pain in your lower back or belly. Get help right away if: You have very bad pain. Your pain cannot be controlled. You cannot pee. Summary Gout is painful swelling of the joints. The most common site of pain is the big toe, but it can affect other joints. Medicines and avoiding some foods can help to prevent and treat gout attacks. This information is not intended to replace advice given to you by your health care provider. Make sure you discuss any questions you have with your health care provider. Document Revised: 07/07/2021 Document Reviewed: 07/07/2021 Elsevier Patient Education  2024 [employer] Inc. Dyslipidemia Dyslipidemia is an imbalance of waxy, fat-like substances (lipids) in the blood. The body needs lipids in small amounts. Dyslipidemia often involves a high level of cholesterol or triglycerides, which are types  of lipids. Common forms of dyslipidemia include: High levels of LDL cholesterol. LDL is the type of cholesterol that causes fatty deposits (plaques) to build up in the blood vessels that carry blood away from the heart (arteries). Low levels of HDL cholesterol. HDL cholesterol is the type of cholesterol that protects against heart disease. High levels of HDL remove the LDL buildup from arteries. High levels of triglycerides. Triglycerides are a fatty substance in the blood that is linked to a buildup of plaques in the arteries. What are the causes? There are two main types of dyslipidemia: primary and secondary. Primary dyslipidemia is caused by changes (mutations) in genes that are passed down through families (inherited). These mutations cause several types of dyslipidemia. Secondary dyslipidemia may be caused by various risk factors that can lead to the disease, such as lifestyle choices and certain medical conditions. What increases the risk? You are more likely to develop this condition if you are an older man or if you are a woman who has gone through menopause. Other risk factors include: Having a family history of dyslipidemia. Taking certain medicines, including birth control pills, steroids, some diuretics, and beta-blockers. Eating a diet high in saturated fat. Smoking cigarettes or excessive alcohol  intake. Having certain medical conditions such as diabetes, polycystic ovary syndrome (PCOS), kidney disease, liver disease, or hypothyroidism. Not exercising regularly. Being overweight or obese with too much belly fat. What are the signs or symptoms? In most cases, dyslipidemia does not usually cause any symptoms. In severe cases, very high lipid levels can cause: Fatty bumps under the skin (xanthomas). A white or gray ring around the black center (pupil) of the eye. Very high triglyceride levels can cause inflammation of the pancreas (pancreatitis). How is this diagnosed? Your  health care provider may diagnose dyslipidemia based on a routine blood test (fasting blood test). Because most people do not  have symptoms of the condition, this blood testing (lipid profile) is done on adults age 49 and older and is repeated every 4-6 years. This test checks: Total cholesterol. This measures the total amount of cholesterol in your blood, including LDL cholesterol, HDL cholesterol, and triglycerides. A healthy number is below 200 mg/dL (4.82 mmol/L). LDL cholesterol. The target number for LDL cholesterol is different for each person, depending on individual risk factors. A healthy number is usually below 100 mg/dL (7.40 mmol/L). Ask your health care provider what your LDL cholesterol should be. HDL cholesterol. An HDL level of 60 mg/dL (8.44 mmol/L) or higher is best because it helps to protect against heart disease. A number below 40 mg/dL (8.96 mmol/L) for men or below 50 mg/dL (8.70 mmol/L) for women increases the risk for heart disease. Triglycerides. A healthy triglyceride number is below 150 mg/dL (8.30 mmol/L). If your lipid profile is abnormal, your health care provider may do other blood tests. How is this treated? Treatment depends on the type of dyslipidemia that you have and your other risk factors for heart disease and stroke. Your health care provider will have a target range for your lipid levels based on this information. Treatment for dyslipidemia starts with lifestyle changes, such as diet and exercise. Your health care provider may recommend that you: Get regular exercise. Make changes to your diet. Quit smoking if you smoke. Limit your alcohol  intake. If diet changes and exercise do not help you reach your goals, your health care provider may also prescribe medicine to lower lipids. The most commonly prescribed type of medicine lowers your LDL cholesterol (statin drug). If you have a high triglyceride level, your provider may prescribe another type of drug (fibrate)  or an omega-3 fish oil supplement, or both. Follow these instructions at home: Eating and drinking  Follow instructions from your health care provider or dietitian about eating or drinking restrictions. Eat a healthy diet as told by your health care provider. This can help you reach and maintain a healthy weight, lower your LDL cholesterol, and raise your HDL cholesterol. This may include: Limiting your calories, if you are overweight. Eating more fruits, vegetables, whole grains, fish, and lean meats. Limiting saturated fat, trans fat, and cholesterol. Do not drink alcohol  if: Your health care provider tells you not to drink. You are pregnant, may be pregnant, or are planning to become pregnant. If you drink alcohol : Limit how much you have to: 0-1 drink a day for women. 0-2 drinks a day for men. Know how much alcohol  is in your drink. In the [place] one drink equals one 12 oz bottle of beer (355 mL), one 5 oz glass of wine (148 mL), or one 1 oz glass of hard liquor (44 mL). Activity Get regular exercise. Start an exercise and strength training program as told by your health care provider. Ask your health care provider what activities are safe for you. Your health care provider may recommend: 30 minutes of aerobic activity 4-6 days a week. Brisk walking is an example of aerobic activity. Strength training 2 days a week. General instructions Do not use any products that contain nicotine or tobacco. These products include cigarettes, chewing tobacco, and vaping devices, such as e-cigarettes. If you need help quitting, ask your health care provider. Take over-the-counter and prescription medicines only as told by your health care provider. This includes supplements. Keep all follow-up visits. This is important. Contact a health care provider if: You are having trouble sticking to your  exercise or diet plan. You are struggling to quit smoking or to control your use of  alcohol . Summary Dyslipidemia often involves a high level of cholesterol or triglycerides, which are types of lipids. Treatment depends on the type of dyslipidemia that you have and your other risk factors for heart disease and stroke. Treatment for dyslipidemia starts with lifestyle changes, such as diet and exercise. Your health care provider may prescribe medicine to lower lipids. This information is not intended to replace advice given to you by your health care provider. Make sure you discuss any questions you have with your health care provider. Document Revised: 05/06/2022 Document Reviewed: 12/07/2020 [employer] Patient Education  [employer].  The above assessment and management plan was discussed with the patient. The patient verbalized understanding of and has agreed to the management plan. Patient is aware to call the clinic if they develop any new symptoms or if symptoms persist or worsen. Patient is aware when to return to the clinic for a follow-up visit. Patient educated on when it is appropriate to go to the emergency department.   Usama Harkless [place] Thompson, DNP [facility] [address] [place], [place] [address] [phone]   "

## 2024-07-25 ENCOUNTER — Ambulatory Visit: Admitting: Nurse Practitioner

## 2024-07-25 ENCOUNTER — Encounter: Payer: Self-pay | Admitting: Nurse Practitioner

## 2024-07-25 VITALS — BP 133/80 | HR 83 | Temp 96.8°F | Ht 69.0 in | Wt 257.6 lb

## 2024-07-25 DIAGNOSIS — K219 Gastro-esophageal reflux disease without esophagitis: Secondary | ICD-10-CM | POA: Diagnosis not present

## 2024-07-25 DIAGNOSIS — M1A2790 Drug-induced chronic gout, unspecified ankle and foot, without tophus (tophi): Secondary | ICD-10-CM

## 2024-07-25 DIAGNOSIS — E039 Hypothyroidism, unspecified: Secondary | ICD-10-CM

## 2024-07-25 DIAGNOSIS — E1121 Type 2 diabetes mellitus with diabetic nephropathy: Secondary | ICD-10-CM

## 2024-07-25 DIAGNOSIS — I10 Essential (primary) hypertension: Secondary | ICD-10-CM | POA: Diagnosis not present

## 2024-07-25 DIAGNOSIS — I5032 Chronic diastolic (congestive) heart failure: Secondary | ICD-10-CM

## 2024-07-25 DIAGNOSIS — D631 Anemia in chronic kidney disease: Secondary | ICD-10-CM

## 2024-07-25 DIAGNOSIS — N184 Chronic kidney disease, stage 4 (severe): Secondary | ICD-10-CM | POA: Diagnosis not present

## 2024-07-25 LAB — BAYER DCA HB A1C WAIVED: HB A1C (BAYER DCA - WAIVED): 6.8 % — ABNORMAL HIGH (ref 4.8–5.6)

## 2024-07-26 LAB — THYROID PANEL WITH TSH
Free Thyroxine Index: 2.9 (ref 1.2–4.9)
T3 Uptake Ratio: 32 % (ref 24–39)
T4, Total: 9.1 ug/dL (ref 4.5–12.0)
TSH: 2.89 u[IU]/mL (ref 0.450–4.500)

## 2024-07-29 ENCOUNTER — Ambulatory Visit: Payer: Self-pay | Admitting: Nurse Practitioner

## 2024-08-11 ENCOUNTER — Other Ambulatory Visit: Payer: Self-pay | Admitting: Nurse Practitioner

## 2024-08-11 DIAGNOSIS — M1A2790 Drug-induced chronic gout, unspecified ankle and foot, without tophus (tophi): Secondary | ICD-10-CM

## 2024-08-25 ENCOUNTER — Other Ambulatory Visit: Payer: Self-pay | Admitting: Internal Medicine

## 2024-08-25 ENCOUNTER — Other Ambulatory Visit: Payer: Self-pay | Admitting: *Deleted

## 2024-08-25 DIAGNOSIS — D649 Anemia, unspecified: Secondary | ICD-10-CM

## 2024-09-01 ENCOUNTER — Other Ambulatory Visit: Payer: Self-pay | Admitting: *Deleted

## 2024-09-01 DIAGNOSIS — E039 Hypothyroidism, unspecified: Secondary | ICD-10-CM

## 2024-09-19 ENCOUNTER — Other Ambulatory Visit: Payer: Self-pay | Admitting: Nurse Practitioner

## 2024-09-22 ENCOUNTER — Other Ambulatory Visit: Payer: Self-pay | Admitting: Nurse Practitioner

## 2024-09-22 DIAGNOSIS — E1121 Type 2 diabetes mellitus with diabetic nephropathy: Secondary | ICD-10-CM

## 2024-09-30 ENCOUNTER — Other Ambulatory Visit: Payer: Self-pay

## 2024-10-15 ENCOUNTER — Other Ambulatory Visit: Payer: Self-pay | Admitting: Nurse Practitioner

## 2024-10-20 ENCOUNTER — Other Ambulatory Visit: Payer: Self-pay | Admitting: Nurse Practitioner

## 2024-10-29 ENCOUNTER — Encounter: Payer: Self-pay | Admitting: Nurse Practitioner

## 2024-10-29 ENCOUNTER — Ambulatory Visit: Payer: Self-pay | Admitting: Nurse Practitioner

## 2024-10-29 VITALS — BP 135/73 | HR 75 | Temp 97.3°F | Ht 69.0 in | Wt 253.0 lb

## 2024-10-29 DIAGNOSIS — I10 Essential (primary) hypertension: Secondary | ICD-10-CM | POA: Diagnosis not present

## 2024-10-29 DIAGNOSIS — E039 Hypothyroidism, unspecified: Secondary | ICD-10-CM

## 2024-10-29 DIAGNOSIS — K219 Gastro-esophageal reflux disease without esophagitis: Secondary | ICD-10-CM

## 2024-10-29 DIAGNOSIS — Z7984 Long term (current) use of oral hypoglycemic drugs: Secondary | ICD-10-CM | POA: Diagnosis not present

## 2024-10-29 DIAGNOSIS — E782 Mixed hyperlipidemia: Secondary | ICD-10-CM | POA: Diagnosis not present

## 2024-10-29 DIAGNOSIS — D500 Iron deficiency anemia secondary to blood loss (chronic): Secondary | ICD-10-CM

## 2024-10-29 DIAGNOSIS — R5383 Other fatigue: Secondary | ICD-10-CM | POA: Diagnosis not present

## 2024-10-29 DIAGNOSIS — N184 Chronic kidney disease, stage 4 (severe): Secondary | ICD-10-CM

## 2024-10-29 DIAGNOSIS — E66812 Obesity, class 2: Secondary | ICD-10-CM

## 2024-10-29 DIAGNOSIS — I5032 Chronic diastolic (congestive) heart failure: Secondary | ICD-10-CM | POA: Diagnosis not present

## 2024-10-29 DIAGNOSIS — E1121 Type 2 diabetes mellitus with diabetic nephropathy: Secondary | ICD-10-CM | POA: Diagnosis not present

## 2024-10-29 LAB — BAYER DCA HB A1C WAIVED: HB A1C (BAYER DCA - WAIVED): 6.9 % — ABNORMAL HIGH (ref 4.8–5.6)

## 2024-10-29 NOTE — Progress Notes (Signed)
 "    Subjective:  Patient ID: Bryan Atkinson, male    DOB: 1956/04/12, 69 y.o.   MRN: [id number]  Patient Care Team: Deitra Morton Sebastian Nena, NP as PCP - General (Nurse Practitioner) Shaaron Lamar HERO, MD as Consulting Physician (Gastroenterology)   Chief Complaint:  Medical Management of Chronic Issues (Ongoing low energy)   HPI: Bryan Atkinson is a 69 y.o. male presenting on 10/29/2024 for Medical Management of Chronic Issues (Ongoing low energy)   Discussed the use of AI scribe software for clinical note transcription with the patient, who gave verbal consent to proceed.  History of Present Illness Bryan Atkinson is a 69 year old male with diabetes and chronic kidney disease who presents with fatigue and low energy.  He experiences persistent fatigue and low energy levels, describing some days as having 'a little energy'. He attempts to engage in activities such as cleaning and hauling items around the house but finds it challenging due to his low energy. He is unsure if his B12 levels have been checked recently and is currently taking niacin , which he mistakenly thought was B12.  He has diabetes, with his last A1c recorded at 6.8, a slight decrease from a previous level of 7.0. He monitors his blood sugar levels, noting a fasting blood sugar of 184 mg/dL on the morning of the visit. He is currently taking Farxiga 5 mg daily and Glipizide  10 mg daily for diabetes management.  He has chronic kidney disease, with his kidneys functioning at approximately 30%. He continues to see a nephrologist and had a visit last month. His last recorded eGFR was 31.  He has a history of iron  deficiency, for which he has received iron  infusions in the past. He notes that when his iron  levels are low, he experiences increased fatigue and dizziness. He recalls a past incident where he felt so fatigued that he 'about fell out' at work and had to be picked up by his wife. He is scheduled to see  a hematologist next month for further evaluation.  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    He has history of CHF currently under the care of cardiology report no recent unexplained weight gain or peripheral edema.  Currently being managed with Lasix  20 mg twice a day taking over-the-counter CoQ 10, carvedilol  History of GERD currently being managed with pantoprazole  40 mg daily  Hypertension this is well-controlled with his current medications losartan 25 mg half a tab daily hydralazine 50 mg twice daily and Coreg 25 mg twice a day.  History of hypothyroidism currently being managed with Synthroid  75 mg daily  History of hyperlipidemia currently being managed with Tricor  145 mg daily denies any side effect from the medication Relevant past medical, surgical, family, and social history reviewed and updated as indicated.  Allergies and medications reviewed and updated. Data reviewed: Chart in Epic.   Past Medical History:  Diagnosis Date   Anemia    Arthritis    Diabetes mellitus without complication (HCC)    GERD (gastroesophageal reflux disease)    Gout    History of kidney stones    Hyperlipidemia    Hypertension     Past Surgical History:  Procedure Laterality Date   BIOPSY  05/25/2020   Procedure: BIOPSY;  Surgeon: Shaaron Lamar HERO, MD;  [facility]: [facility];  Service: Endoscopy;;   BIOPSY  03/25/2022   Procedure:  BIOPSY;  Surgeon: Eartha Flavors, Toribio, MD;  [facility]: [facility];  Service: Gastroenterology;;   BIOPSY  06/15/2022   Procedure: BIOPSY;  Surgeon: Shaaron Lamar HERO, MD;  [facility]: [facility];  Service: Endoscopy;;   CATARACT EXTRACTION W/PHACO Right 05/13/2019   Procedure: CATARACT EXTRACTION PHACO AND INTRAOCULAR LENS PLACEMENT (IOC);  Surgeon: Harrie Agent, MD;  Location: [facility];  Service: Ophthalmology;  Laterality: Right;  CDE: 10.31   CATARACT EXTRACTION W/PHACO Left 05/27/2019    Procedure: CATARACT EXTRACTION PHACO AND INTRAOCULAR LENS PLACEMENT (IOC);  Surgeon: Harrie Agent, MD;  Location: [facility];  Service: Ophthalmology;  Laterality: Left;  CDE: 6.49   COLONOSCOPY     times two 2002 ,2007   COLONOSCOPY N/A 10/07/2013   Procedure: COLONOSCOPY;  Surgeon: Lamar HERO Shaaron, MD;  [facility]: [facility];  Service: Endoscopy;  Laterality: N/A;  8:30 AM   COLONOSCOPY N/A 10/05/2018   Dr. Shaaron: Diverticulosis.  Next colonoscopy 5 years due to first-degree relative with colon cancer   COLONOSCOPY WITH PROPOFOL  N/A 09/27/2023   Procedure: COLONOSCOPY WITH PROPOFOL ;  Surgeon: Shaaron Lamar HERO, MD;  [facility]: [facility];  Service: Endoscopy;  Laterality: N/A;  12:30pm, asa 3   ESOPHAGOGASTRODUODENOSCOPY (EGD) WITH PROPOFOL  N/A 05/25/2020   [name]: 2 gastric ulcers.  No H. pylori.  Likely NSAID related.  Repeat EGD in 3 months   ESOPHAGOGASTRODUODENOSCOPY (EGD) WITH PROPOFOL  N/A 09/17/2020   Procedure: ESOPHAGOGASTRODUODENOSCOPY (EGD) WITH PROPOFOL ;  Surgeon: Shaaron Lamar HERO, MD;  [facility];  Service: Endoscopy;  Laterality: N/A;  10:00am   ESOPHAGOGASTRODUODENOSCOPY (EGD) WITH PROPOFOL  N/A 03/25/2022   Procedure: ESOPHAGOGASTRODUODENOSCOPY (EGD) WITH PROPOFOL ;  Surgeon: Eartha Flavors Toribio, MD;  [facility];  Service: Gastroenterology;  Laterality: N/A;   ESOPHAGOGASTRODUODENOSCOPY (EGD) WITH PROPOFOL  N/A 06/15/2022   Procedure: ESOPHAGOGASTRODUODENOSCOPY (EGD) WITH PROPOFOL ;  Surgeon: Shaaron Lamar HERO, MD;  [facility];  Service: Endoscopy;  Laterality: N/A;  7:30am   JOINT REPLACEMENT     KNEE ARTHROPLASTY Left 06/27/2019   Procedure: COMPUTER ASSISTED TOTAL KNEE ARTHROPLASTY;  Surgeon: Fidel Rogue, MD;  Location: [facility];  Service: Orthopedics;  Laterality: Left;   KNEE ARTHROPLASTY Right 08/15/2019   Procedure: COMPUTER ASSISTED TOTAL KNEE ARTHROPLASTY;  Surgeon: Fidel Rogue, MD;  Location: [facility];  Service: Orthopedics;   Laterality: Right;   LEG SURGERY Right early 80's   POLYPECTOMY  09/27/2023   Procedure: POLYPECTOMY;  Surgeon: Shaaron Lamar HERO, MD;  [facility];  Service: Endoscopy;;    Social History   Socioeconomic History   Marital status: Married    Spouse name: Not on file   Number of children: 2   Years of education: Not on file   Highest education level: Never attended school  Occupational History   Occupation: Truck Hospital Doctor    Comment: runs a designer, television/film set as well, [employer] in [place]  Tobacco Use   Smoking status: Never   Smokeless tobacco: Never  Vaping Use   Vaping status: Never Used  Substance and Sexual Activity   Alcohol  use: Yes    Comment: occasionally   Drug use: No   Sexual activity: Yes  Other Topics Concern   Not on file  Social History Narrative   Daughter is 55, Son is 47.  3 grandchildren. Married for 34 years   Social Drivers of Health   Tobacco Use: Low Risk (10/29/2024)   Patient History    Smoking Tobacco Use: Never    Smokeless Tobacco  Use: Never    Passive Exposure: Not on file  Financial Resource Strain: Medium Risk (10/27/2024)   Overall Financial Resource Strain (CARDIA)    Difficulty of Paying Living Expenses: Somewhat hard  Food Insecurity: No Food Insecurity (10/27/2024)   Epic    Worried About Programme Researcher, Broadcasting/film/video in the Last Year: Never true    Ran Out of Food in the Last Year: Never true  Transportation Needs: No Transportation Needs (10/27/2024)   Epic    Lack of Transportation (Medical): No    Lack of Transportation (Non-Medical): No  Physical Activity: Insufficiently Active (10/27/2024)   Exercise Vital Sign    Days of Exercise per Week: 1 day    Minutes of Exercise per Session: 10 min  Stress: No Stress Concern Present (10/27/2024)   [employer] of Occupational Health - Occupational Stress Questionnaire    Feeling of Stress: Not at all  Social Connections: Moderately Isolated (10/27/2024)   Social Connection and Isolation  Panel    Frequency of Communication with Friends and Family: More than three times a week    Frequency of Social Gatherings with Friends and Family: Twice a week    Attends Religious Services: Never    Database Administrator or Organizations: No    Attends Engineer, Structural: Not on file    Marital Status: Married  Catering Manager Violence: Not on file  Depression (PHQ2-9): Low Risk (10/29/2024)   Depression (PHQ2-9)    PHQ-2 Score: 4  Alcohol  Screen: Low Risk (10/27/2024)   Alcohol  Screen    Last Alcohol  Screening Score (AUDIT): 1  Housing: Low Risk (10/27/2024)   Epic    Unable to Pay for Housing in the Last Year: No    Number of Times Moved in the Last Year: 0    Homeless in the Last Year: No  Utilities: Not on file  Health Literacy: Not on file    Outpatient Encounter Medications as of 10/29/2024  Medication Sig   acetaminophen  (TYLENOL ) 500 MG tablet Take 500 mg by mouth every 6 (six) hours as needed for mild pain.   allopurinol  (ZYLOPRIM ) 300 MG tablet TAKE 1 TABLET BY MOUTH EVERY DAY   amLODipine  (NORVASC ) 5 MG tablet Take 0.5 tablets (2.5 mg total) by mouth daily.   Apple Cider Vinegar 500 MG TABS Take 500 mg by mouth daily.    aspirin  EC 81 MG tablet Take 81 mg by mouth daily. Swallow whole.   carvedilol (COREG) 25 MG tablet Take 25 mg by mouth 2 (two) times daily.   cholecalciferol (VITAMIN D3) 25 MCG (1000 UNIT) tablet Take 1,000 Units by mouth daily.   Co-Enzyme Q10 100 MG CAPS Take 100 mg by mouth daily.    Cranberry 500 MG TABS Take 500 mg by mouth daily.   FARXIGA 5 MG TABS tablet Take 5 mg by mouth every morning.   fenofibrate  (TRICOR ) 145 MG tablet TAKE 1 TABLET BY MOUTH EVERY DAY   ferrous sulfate 325 (65 FE) MG tablet Take 325 mg by mouth daily with breakfast.   Flaxseed, Linseed, (FLAXSEED OIL) 1000 MG CAPS Take 1,000 mg by mouth daily.   furosemide  (LASIX ) 20 MG tablet Take 1 tablet (20 mg total) by mouth 2 (two) times daily.   glipiZIDE  (GLUCOTROL   XL) 10 MG 24 hr tablet TAKE 1 TABLET (10 MG TOTAL) BY MOUTH DAILY WITH BREAKFAST.   glucose blood test strip 1 each by Other route as needed for other. Use as instructed  hydrALAZINE (APRESOLINE) 100 MG tablet Take 50 mg by mouth 2 (two) times daily.   Krill Oil 500 MG CAPS Take 500 mg by mouth 2 (two) times a day.   levothyroxine  (SYNTHROID ) 75 MCG tablet TAKE 1 TABLET BY MOUTH DAILY BEFORE BREAKFAST.   losartan (COZAAR) 25 MG tablet Take 12.5 mg by mouth daily.   montelukast  (SINGULAIR ) 10 MG tablet TAKE 1 TABLET BY MOUTH EVERYDAY AT BEDTIME   Multiple Vitamin (MULTIVITAMIN) tablet Take 1 tablet by mouth daily.   mupirocin  cream (BACTROBAN ) 2 % Apply 1 Application topically 2 (two) times daily.   niacin  (NIASPAN ) 1000 MG CR tablet TAKE 1 TABLET (1,000 MG TOTAL) BY MOUTH EVERY EVENING.   pantoprazole  (PROTONIX ) 40 MG tablet TAKE 1 TABLET (40 MG TOTAL) BY MOUTH TWICE A DAY BEFORE MEALS   pravastatin  (PRAVACHOL ) 40 MG tablet TAKE 1 TABLET BY MOUTH EVERYDAY AT BEDTIME   traZODone (DESYREL) 50 MG tablet Take 50 mg by mouth at bedtime.    Turmeric 500 MG CAPS Take 500 mg by mouth daily.   No facility-administered encounter medications on file as of 10/29/2024.    Allergies[1]  Pertinent ROS per HPI, otherwise unremarkable      Objective:  BP 135/73   Pulse 75   Temp (!) 97.3 F (36.3 C)   Ht 5' 9 (1.753 m)   Wt 253 lb (114.8 kg)   SpO2 95%   BMI 37.36 kg/m    Wt Readings from Last 3 Encounters:  10/29/24 253 lb (114.8 kg)  07/25/24 257 lb 9.6 oz (116.8 kg)  04/24/24 258 lb 9.6 oz (117.3 kg)    Physical Exam Vitals and nursing note reviewed.  Constitutional:      General: He is not in acute distress.    Appearance: He is obese.  HENT:     Head: Normocephalic and atraumatic.     Nose: Nose normal.     Mouth/Throat:     Mouth: Mucous membranes are moist.  Eyes:     Extraocular Movements: Extraocular movements intact.     Conjunctiva/sclera: Conjunctivae normal.      Pupils: Pupils are equal, round, and reactive to light.  Cardiovascular:     Heart sounds: Normal heart sounds.  Pulmonary:     Effort: Pulmonary effort is normal.     Breath sounds: Normal breath sounds.  Abdominal:     General: Bowel sounds are normal.  Musculoskeletal:        General: Normal range of motion.     Right lower leg: No edema.     Left lower leg: No edema.  Skin:    General: Skin is warm and dry.     Findings: No rash.  Neurological:     Mental Status: He is alert and oriented to person, place, and time.  Psychiatric:        Mood and Affect: Mood normal.        Behavior: Behavior normal.        Thought Content: Thought content normal.        Judgment: Judgment normal.    Physical Exam      Results for orders placed or performed in visit on 07/25/24  Bayer DCA Hb A1c Waived   Collection Time: 07/25/24  8:24 AM  Result Value Ref Range   HB A1C (BAYER DCA - WAIVED) 6.8 (H) 4.8 - 5.6 %  Thyroid  Panel With TSH   Collection Time: 07/25/24  8:26 AM  Result Value Ref Range  TSH 2.890 0.450 - 4.500 uIU/mL   T4, Total 9.1 4.5 - 12.0 ug/dL   T3 Uptake Ratio 32 24 - 39 %   Free Thyroxine Index 2.9 1.2 - 4.9       Pertinent labs & imaging results that were available during my care of the patient were reviewed by me and considered in my medical decision making.  Assessment & Plan:  Bryan Atkinson was seen today for medical management of chronic issues.  Diagnoses and all orders for this visit:  Type 2 diabetes with nephropathy (HCC) -     Bayer DCA Hb A1c Waived -     Microalbumin/Creatinine Ratio, Urine  Low energy -     B12 and Folate Panel  Chronic kidney disease, stage 4 (severe) (HCC)  Chronic diastolic (congestive) heart failure (HCC)  Hypothyroidism (acquired)  Iron  deficiency anemia due to chronic blood loss  Obesity, morbid (HCC)  Essential hypertension  Gastroesophageal reflux disease, unspecified whether esophagitis present  Moderate mixed  hyperlipidemia not requiring statin therapy     Assessment and Plan Bryan Atkinson is a 69 year old Caucasian male seen today for chronic disease management, no acute distress Assessment & Plan Type 2 diabetes mellitus with diabetic nephropathy Blood glucose 184 mg/dL fasting. A1c improved to 6.8%. Stable kidney function with eGFR 31 mL/min/1.73 m2. - Continue Farxiga 5 mg daily and Glipizide  10 mg daily. - Ordered A1c test with blood sample. -Microalbumin ordered result pending - Continue follow-up with nephrology.  Chronic kidney disease, stage 4 Kidney function stable with eGFR 31 mL/min/1.73 m2. - Continue follow-up with nephrology.  Iron  deficiency anemia in chronic kidney disease Low energy and dizziness possibly due to low iron . Previous iron  infusions effective. - Continue follow-up with hematology for potential iron  infusion. Low energy Ordered B12 level  Hypertension -Well-controlled with current medications -Continue losartan 20 mg daily; Coreg 25 mg twice daily hydralazine 50 mg twice a day  CHF Continue follow-up with cardiology continue Lasix  20 mg daily  Hyperlipidemia  continue Tricor  140 mg daily      Continue all other maintenance medications.  Follow up plan: Return in about 3 months (around 01/27/2025) for Chronic Diseases Management.   Continue healthy lifestyle choices, including diet (rich in fruits, vegetables, and lean proteins, and low in salt and simple carbohydrates) and exercise (at least 30 minutes of moderate physical activity daily).  Educational handout given for   Clinical References  Heart Failure and Exercise: What to Know Heart failure is a long-term condition where the heart can't pump enough blood through the body. When this happens, parts of the body don't get the blood and oxygen they need. Living with heart failure can be a challenge. But there are things you can do to help improve your symptoms. One thing is to follow the instructions  from your health care provider about living a healthy lifestyle. This includes choosing the right exercise plan. Doing daily physical activity is important when you have heart failure. You may have some limits on your activity, so talk to your provider before doing any exercises. What are the benefits of exercise? Exercise may: Make your heart muscles stronger and help your body use oxygen better. This helps with symptoms like tiredness and shortness of breath. Help your bones stay strong. Improve your blood flow. Help your mental health by lowering the risk of depression. Decrease your chance of going to the hospital for heart failure. Improve your risk factors by: Lowering your blood pressure. Lowering your cholesterol. Improving diabetes  if you have it. Helping you to lose weight. What is an exercise plan? An exercise plan is a set of specific exercises and activities. You'll work with your provider to create the plan that works for you. The plan may include: Cardiac rehabilitation. These are supervised exercises that are designed to help your heart. Different types of exercises such as: Strengthening. Balance. Flexibility. Aerobic. What are strengthening exercises? Strengthening exercises involve using resistance to improve your muscle strength. These usually have repeated motions. They can include: Lifting weights. Using weight machines. Using resistance tubes and bands. Using your body weight, such as doing push-ups or squats. What are balance exercises? Balance exercises strengthen the muscles of the back, belly, and pelvis (core muscles). They improve your balance and can lower your risk of falling. These may include: Standing on one leg. Walking backward, sideways, and in a straight line. Standing up after sitting, without using your hands. Shifting your weight from one leg to the other. Doing tai chi. This uses slow movements and deep breathing. Doing yoga. How can I  increase my flexibility? Flexibility exercises can lengthen your muscles, improve your range of motion, and help your joints. They can also lower your risk of falling. These may include: Doing tai chi. Doing yoga. Doing Pilates. Stretching. How much aerobic exercise should I get?  Aerobic exercise strengthens your breathing and blood flow. It also increases your body's use of oxygen. This type of exercise causes your heart to beat faster while you're doing it. Examples include biking, walking, and swimming.  Talk to your provider to find out how much aerobic exercise is safe for you. To do this type of exercise: Start slowly, limiting the amount of time at first. You may need to start with 5 minutes every day. Slowly add more minutes until you can safely do at least 30 minutes, at least 5 days a week. This information is not intended to replace advice given to you by your health care provider. Make sure you discuss any questions you have with your health care provider. Document Revised: 05/18/2023 Document Reviewed: 05/18/2023 Elsevier Patient Education  2024 Elsevier Inc. Chronic Kidney Disease in Adults: What to Know Chronic kidney disease (CKD) is when lasting damage happens to the kidneys slowly over time. The kidneys are two organs that do many important things in the body. These include: Taking waste and extra fluid out of the blood to make pee (urine). Making hormones. Keeping the right amount of fluids and chemicals in the body. A small amount of kidney damage may not cause problems. You must take steps to help keep the kidney damage from getting worse. A lot of damage may cause kidney failure. Kidney failure means the kidneys can no longer work right. What are the causes? Diabetes. High blood pressure. Diseases that affect the heart and blood vessels. Other kidney diseases. Diseases that affect the body's defense system (immune system). A problem with the flow of pee. This may  be caused by: Kidney stones. Cancer. An enlarged prostate, in males. A kidney infection or urinary tract infection (UTI) that keeps coming back. What increases the risk? Getting older. The chances of having CKD increase with age. A family history of kidney disease or kidney failure. Having a disease caused by genes. Taking medicines that can harm the kidneys. Being near or having contact with harmful substances. Being very overweight. Using tobacco now or in the past. What are the signs or symptoms? Common symptoms of CKD include: Feeling very tired  and having less energy. Swelling of the face, legs, ankles, or feet. Throwing up or feeling like you may throw up. Not wanting to eat as much as normal. Being confused or not able to focus. Twitches and cramps in the leg muscles or other muscles. Dry, itchy skin. Other symptoms may include: Shortness of breath. Trouble sleeping. Making less pee, or making more pee, especially at night. A taste of metal in your mouth. You may also become anemic. Anemia means there's not enough red blood cells in your blood. You may get symptoms slowly. You may not notice them until the kidney damage gets very bad. How is this diagnosed? CKD may be diagnosed based on: Tests on your blood or pee. Imaging tests, like an ultrasound or a CT scan. A kidney biopsy. For this test, a sample of kidney tissue is removed to be looked at under a microscope. These tests will help to find out how serious the CKD is. How is this treated? Often, there's no cure for CKD. Treatment can help with symptoms and help keep the disease from getting worse. Treatment may include: Treating other problems that are causing your CKD or making it worse. Diet changes. You may need to: Avoid alcohol . Avoid foods that are high in salt, potassium, phosphorous, and protein. Taking medicines for symptoms and to help control other conditions. Dialysis. This treatment gets harmful waste  out of your body. It may be needed if you have kidney failure. Follow these instructions at home: Medicines Take your medicines only as told. The amount of some medicines you take may need to be changed. Do not take any new medicines, vitamins, or supplements unless your health care provider says it's okay. These may make kidney damage worse. Lifestyle Do not smoke, vape, or use nicotine or tobacco. If you drink alcohol : Limit how much you have to: 0-1 drink a day if you're male. 0-2 drinks a day if you're male. Know how much alcohol  is in your drink. In the [place] one drink is one 12 oz bottle of beer (355 mL), one 5 oz glass of wine (148 mL), or one 1 oz glass of hard liquor (44 mL). Stay at a healthy weight. If you need help, ask your provider. General instructions  Eat and drink as told. Track your blood pressure at home. Tell your provider about any changes. If you have diabetes, track your blood sugar as told. Exercise at least 30 minutes a day, 5 days a week. Keep your shots (vaccinations) up to date. Keep all follow-up visits. Your provider may need to change your treatments over time. Where to find support American Kidney Fund: [place].com.au Kidney School: kidneyschool.org American Association of Kidney Patients: https://www.miller-montoya.com/ Where to find more information National Kidney Foundation: kidney.org Centers for Disease Control and Prevention. To learn more: Go to Diningcalendar.de. Click Search. Type chronic kidney disease in the search box. Contact a health care provider if: You have new symptoms. You get symptoms of end-stage kidney disease. These include: Headaches. Numbness in your hands or feet. Leg cramps. Easy bruising. Get help right away if: You have a fever. You make less pee than usual. You have pain or bleeding when you pee or poop. You have chest pain. You have shortness of breath. These symptoms may be an emergency. Call 911 right away. Do not wait to see if the  symptoms will go away. Do not drive yourself to the hospital. This information is not intended to replace advice given to you by  your health care provider. Make sure you discuss any questions you have with your health care provider. Document Revised: 08/15/2023 Document Reviewed: 04/07/2023 Elsevier Patient Education  2024 Elsevier Inc. Hypertension, Adult Hypertension is another name for high blood pressure. High blood pressure forces your heart to work harder to pump blood. This can cause problems over time. There are two numbers in a blood pressure reading. There is a top number (systolic) over a bottom number (diastolic). It is best to have a blood pressure that is below 120/80. What are the causes? The cause of this condition is not known. Some other conditions can lead to high blood pressure. What increases the risk? Some lifestyle factors can make you more likely to develop high blood pressure: Smoking. Not getting enough exercise or physical activity. Being overweight. Having too much fat, sugar, calories, or salt (sodium) in your diet. Drinking too much alcohol . Other risk factors include: Having any of these conditions: Heart disease. Diabetes. High cholesterol. Kidney disease. Obstructive sleep apnea. Having a family history of high blood pressure and high cholesterol. Age. The risk increases with age. Stress. What are the signs or symptoms? High blood pressure may not cause symptoms. Very high blood pressure (hypertensive crisis) may cause: Headache. Fast or uneven heartbeats (palpitations). Shortness of breath. Nosebleed. Vomiting or feeling like you may vomit (nauseous). Changes in how you see. Very bad chest pain. Feeling dizzy. Seizures. How is this treated? This condition is treated by making healthy lifestyle changes, such as: Eating healthy foods. Exercising more. Drinking less alcohol . Your doctor may prescribe medicine if lifestyle changes do not help  enough and if: Your top number is above 130. Your bottom number is above 80. Your personal target blood pressure may vary. Follow these instructions at home: Eating and drinking  If told, follow the DASH eating plan. To follow this plan: Fill one half of your plate at each meal with fruits and vegetables. Fill one fourth of your plate at each meal with whole grains. Whole grains include whole-wheat pasta, brown rice, and whole-grain bread. Eat or drink low-fat dairy products, such as skim milk or low-fat yogurt. Fill one fourth of your plate at each meal with low-fat (lean) proteins. Low-fat proteins include fish, chicken without skin, eggs, beans, and tofu. Avoid fatty meat, cured and processed meat, or chicken with skin. Avoid pre-made or processed food. Limit the amount of salt in your diet to less than 1,500 mg each day. Do not drink alcohol  if: Your doctor tells you not to drink. You are pregnant, may be pregnant, or are planning to become pregnant. If you drink alcohol : Limit how much you have to: 0-1 drink a day for women. 0-2 drinks a day for men. Know how much alcohol  is in your drink. In the [place] one drink equals one 12 oz bottle of beer (355 mL), one 5 oz glass of wine (148 mL), or one 1 oz glass of hard liquor (44 mL). Lifestyle  Work with your doctor to stay at a healthy weight or to lose weight. Ask your doctor what the best weight is for you. Get at least 30 minutes of exercise that causes your heart to beat faster (aerobic exercise) most days of the week. This may include walking, swimming, or biking. Get at least 30 minutes of exercise that strengthens your muscles (resistance exercise) at least 3 days a week. This may include lifting weights or doing Pilates. Do not smoke or use any products that contain nicotine or  tobacco. If you need help quitting, ask your doctor. Check your blood pressure at home as told by your doctor. Keep all follow-up  visits. Medicines Take over-the-counter and prescription medicines only as told by your doctor. Follow directions carefully. Do not skip doses of blood pressure medicine. The medicine does not work as well if you skip doses. Skipping doses also puts you at risk for problems. Ask your doctor about side effects or reactions to medicines that you should watch for. Contact a doctor if: You think you are having a reaction to the medicine you are taking. You have headaches that keep coming back. You feel dizzy. You have swelling in your ankles. You have trouble with your vision. Get help right away if: You get a very bad headache. You start to feel mixed up (confused). You feel weak or numb. You feel faint. You have very bad pain in your: Chest. Belly (abdomen). You vomit more than once. You have trouble breathing. These symptoms may be an emergency. Get help right away. Call 911. Do not wait to see if the symptoms will go away. Do not drive yourself to the hospital. Summary Hypertension is another name for high blood pressure. High blood pressure forces your heart to work harder to pump blood. For most people, a normal blood pressure is less than 120/80. Making healthy choices can help lower blood pressure. If your blood pressure does not get lower with healthy choices, you may need to take medicine. This information is not intended to replace advice given to you by your health care provider. Make sure you discuss any questions you have with your health care provider. Document Revised: 07/22/2021 Document Reviewed: 07/22/2021 Elsevier Patient Education  2024 [employer] GERD in Adults: Diet Changes When you have gastroesophageal reflux disease (GERD), you may need to make changes to your diet. Choosing the right foods can help with your symptoms. Think about working with an expert in healthy eating called a dietitian. They can help you make healthy food choices. What are tips for  following this plan? Reading food labels Look for foods that are low in saturated fat. Foods that may help with your symptoms include: Foods with less than 5% of daily value (DV) of fat. Foods with 0 grams of trans fat. Cooking [employer] in ways that don't use a lot of fat. These ways include: Baking. Steaming. Grilling. Broiling. To add flavor, try to use herbs that are low in spice and acidity. Avoid frying your food. Meal planning  Eat small meals often rather than eating 3 large meals each day. Eat your meals slowly in a place where you feel relaxed. If told by your health care provider, avoid: Foods that cause symptoms. Keep a food diary to keep track of foods that cause symptoms. Alcohol . Drinking a lot of liquid with meals. General instructions For 2-3 hours after you eat, avoid: Bending over. Exercise. Lying down. Chew sugar-free gum after meals. What foods should I eat? Eat a healthy diet. Try to include: Foods with high amounts of fiber. These include: Fruits and vegetables. Whole grains and beans. Low-fat dairy products. Lean meats, fish, and poultry. Egg whites. Foods that cause symptoms in someone else may not cause symptoms for you. Work with your provider to find foods that are safe for you. The items listed above may not be all the foods and drinks you can have. Talk with a dietitian to learn more. The items listed above may not be a complete  list of foods and beverages you can eat and drink. Contact a dietitian for more information. What foods should I avoid? Limiting some of these foods may help with your symptoms. Each person is different. Talk with a dietitian or your provider to help you find the exact foods to avoid. Some of the foods to avoid may include: Fruits Fruits with a lot of acid in them. These may include citrus fruits, such as oranges, grapefruit, pineapple, and lemons. Vegetables Deep-fried vegetables, such as French  fries. Vegetables, sauces, or toppings made with added fat and vegetables with acid in them. These may include tomatoes and tomato products, chili peppers, onions, garlic, and horseradish. Grains Pastries or quick breads with added fat. Meats and other proteins High-fat meats, such as fatty beef or pork, hot dogs, ribs, ham, sausage, salami, and bacon. Fried meat or protein, such as fried fish and fried chicken. Egg yolks. Fats and oils Butter. Margarine. Shortening. Ghee. Drinks Coffee and other drinks with caffeine in them. Fizzy and sugary drinks, such as soda and energy drinks. Fruit juice made with acidic fruits, such as orange or grapefruit. Tomato juice. Sweets and desserts Chocolate and cocoa. Donuts. Seasonings and condiments Mint, such as peppermint and spearmint. Condiments, herbs, or seasonings that cause symptoms. These may include curry, hot sauce, or vinegar-based salad dressings. The items listed above may not be all the foods and drinks you should avoid. Talk with a dietitian to learn more. Questions to ask your health care provider Changes to your diet and everyday life are often the first steps taken to manage symptoms of GERD. If these changes don't help, talk with your provider about taking medicines. Where to find more information [employer]: aboutgerd.org This information is not intended to replace advice given to you by your health care provider. Make sure you discuss any questions you have with your health care provider. Document Revised: 08/15/2023 Document Reviewed: 03/01/2023 Elsevier Patient Education  2024 [employer] Hypothyroidism  Hypothyroidism is when the thyroid  gland does not make enough of certain hormones. This is called an underactive thyroid . The thyroid  gland is a small gland located in the lower front part of the neck, just in front of the windpipe (trachea). This gland makes hormones that help control  how the body uses food for energy (metabolism) as well as how the heart and brain function. These hormones also play a role in keeping your bones strong. When the thyroid  is underactive, it produces too little of the hormones thyroxine (T4) and triiodothyronine (T3). What are the causes? This condition may be caused by: Hashimoto's disease. This is a disease in which the body's disease-fighting system (immune system) attacks the thyroid  gland. This is the most common cause. Viral infections. Pregnancy. Certain medicines. Birth defects. Problems with a gland in the center of the brain (pituitary gland). Lack of enough iodine  in the diet. Other causes may include: Past radiation treatments to the head or neck for cancer. Past treatment with radioactive iodine . Past exposure to radiation in the environment. Past surgical removal of part or all of the thyroid . What increases the risk? You are more likely to develop this condition if: You are male. You have a family history of thyroid  conditions. You use a medicine called lithium. You take medicines that affect the immune system (immunosuppressants). What are the signs or symptoms? Common symptoms of this condition include: Not being able to tolerate cold. Feeling as though you have no energy (lethargy). Lack of  appetite. Constipation. Sadness or depression. Weight gain that is not explained by a change in diet or exercise habits. Menstrual irregularity. Dry skin, coarse hair, or brittle nails. Other symptoms may include: Muscle pain. Slowing of thought processes. Poor memory. How is this diagnosed? This condition may be diagnosed based on: Your symptoms, your medical history, and a physical exam. Blood tests. You may also have imaging tests, such as an ultrasound or MRI. How is this treated? This condition is treated with medicine that replaces the thyroid  hormones that your body does not make. After you begin treatment, it may  take several weeks for symptoms to go away. Follow these instructions at home: Take over-the-counter and prescription medicines only as told by your health care provider. If you start taking any new medicines, tell your health care provider. Keep all follow-up visits as told by your health care provider. This is important. As your condition improves, your dosage of thyroid  hormone medicine may change. You will need to have blood tests regularly so that your health care provider can monitor your condition. Contact a health care provider if: Your symptoms do not get better with treatment. You are taking thyroid  hormone replacement medicine and you: Sweat a lot. Have tremors. Feel anxious. Lose weight rapidly. Cannot tolerate heat. Have emotional swings. Have diarrhea. Feel weak. Get help right away if: You have chest pain. You have an irregular heartbeat. You have a rapid heartbeat. You have difficulty breathing. These symptoms may be an emergency. Get help right away. Call 911. Do not wait to see if the symptoms will go away. Do not drive yourself to the hospital. Summary Hypothyroidism is when the thyroid  gland does not make enough of certain hormones (it is underactive). When the thyroid  is underactive, it produces too little of the hormones thyroxine (T4) and triiodothyronine (T3). The most common cause is Hashimoto's disease, a disease in which the body's disease-fighting system (immune system) attacks the thyroid  gland. The condition can also be caused by viral infections, medicine, pregnancy, or past radiation treatment to the head or neck. Symptoms may include weight gain, dry skin, constipation, feeling as though you do not have energy, and not being able to tolerate cold. This condition is treated with medicine to replace the thyroid  hormones that your body does not make. This information is not intended to replace advice given to you by your health care provider. Make sure  you discuss any questions you have with your health care provider. Document Revised: 10/05/2021 Document Reviewed: 10/05/2021 Elsevier Patient Education  2024 [employer]. Anemia  Anemia is a condition in which there are not enough red blood cells or hemoglobin in the blood. Hemoglobin is a substance in red blood cells that carries oxygen. When you do not have enough red blood cells or hemoglobin (are anemic), your body cannot get enough oxygen, and your organs may not work properly. As a result, you may feel very tired or have other problems. What are the causes? Common causes of anemia include: Excessive bleeding. Anemia can be caused by excessive bleeding inside or outside the body, including bleeding from the intestines or from heavy menstrual periods in females. Poor nutrition. Long-lasting (chronic) kidney, thyroid , and liver disease. Bone marrow disorders, spleen problems, and blood disorders. Cancer and treatments for cancer. Human immunodeficiency virus (HIV) and acquired immunodeficiency syndrome (AIDS). Infections, medicines, and autoimmune disorders that destroy red blood cells. What are the signs or symptoms? Symptoms of this condition include: Minor weakness. Dizziness. Headache, or difficulties  concentrating and sleeping. Heartbeats that feel irregular or faster than normal (palpitations). Shortness of breath, especially with exercise. Pale skin, lips, and nails, or cold hands and feet. Upset stomach (indigestion) and nausea. Symptoms may occur suddenly or develop slowly. If your anemia is mild, you may not have symptoms. How is this diagnosed? This condition is diagnosed based on blood tests, your medical history, and a physical exam. In some cases, a test may be needed in which cells are removed from the soft tissue inside of a bone and looked at under a microscope (bone marrow biopsy). Your health care provider may also check your stool (feces) for blood and may do more  testing to look for the cause of your bleeding. Other tests may include: Imaging tests, such as a CT scan or MRI. A procedure to see inside your esophagus and stomach (endoscopy). The esophagus is the part of the body that moves food from your mouth to your stomach. A procedure to see inside your colon and rectum (colonoscopy). How is this treated? Treatment for this condition depends on the cause. If you continue to lose a lot of blood, you may need to be treated at a hospital. Treatment may include: Taking supplements of iron , vitamin B12, or folic acid . Taking a hormone medicine (erythropoietin) that can help to stimulate red blood cell growth. Receiving donated blood through an IV (blood transfusion). This may be needed if you lose a lot of blood. Making changes to your diet. Having surgery to remove your spleen. Follow these instructions at home: Take over-the-counter and prescription medicines only as told by your health care provider. Take supplements only as told by your health care provider. Follow any diet instructions that you were given by your health care provider. Keep all follow-up visits. Your health care provider will want to recheck your blood tests. Contact a health care provider if: You develop new bleeding anywhere in the body. You are very weak. Get help right away if: You are short of breath. You have pain in your abdomen or chest. You are dizzy or feel faint. You have trouble concentrating. You have bloody stools, black stools, or tarry stools. You vomit repeatedly or you vomit up blood. These symptoms may be an emergency. Get help right away. Call 911. Do not wait to see if the symptoms will go away. Do not drive yourself to the hospital. Summary Anemia is a condition in which you do not have enough red blood cells or enough of a substance in your red blood cells that carries oxygen. Symptoms may occur suddenly or develop slowly. If your anemia is mild, you  may not have symptoms. This condition is diagnosed with blood tests, a medical history, and a physical exam. Other tests may be needed. Treatment for this condition depends on the cause of the anemia. This information is not intended to replace advice given to you by your health care provider. Make sure you discuss any questions you have with your health care provider. Document Revised: 12/27/2021 Document Reviewed: 12/27/2021 Elsevier Patient Education  2024 [employer] Inc. Fatigue If you have fatigue, you feel tired all the time and have a lack of energy or a lack of motivation. Fatigue may make it difficult to start or complete tasks because of exhaustion. Occasional or mild fatigue is often a normal response to activity or life. However, long-term (chronic) or extreme fatigue may be a symptom of a medical condition such as: Depression. Not having enough red blood cells or  hemoglobin in the blood (anemia). A problem with a small gland located in the lower front part of the neck (thyroid  disorder). Rheumatologic conditions. These are problems related to the body's defense system (immune system). Infections, especially certain viral infections. Fatigue can also lead to negative health outcomes over time. Follow these instructions at home: Medicines Take over-the-counter and prescription medicines only as told by your health care provider. Take a multivitamin if told by your health care provider. Do not use herbal or dietary supplements unless they are approved by your health care provider. Eating and drinking  Avoid heavy meals in the evening. Eat a well-balanced diet, which includes lean proteins, whole grains, plenty of fruits and vegetables, and low-fat dairy products. Avoid eating or drinking too many products with caffeine in them. Avoid alcohol . Drink enough fluid to keep your urine pale yellow. Activity  Exercise regularly, as told by your health care provider. Use or practice  techniques to help you relax, such as yoga, tai chi, meditation, or massage therapy. Lifestyle Change situations that cause you stress. Try to keep your work and personal schedules in balance. Do not use recreational or illegal drugs. General instructions Monitor your fatigue for any changes. Go to bed and get up at the same time every day. Avoid fatigue by pacing yourself during the day and getting enough sleep at night. Maintain a healthy weight. Contact a health care provider if: Your fatigue does not get better. You have a fever. You suddenly lose or gain weight. You have headaches. You have trouble falling asleep or sleeping through the night. You feel angry, guilty, anxious, or sad. You have swelling in your legs or another part of your body. Get help right away if: You feel confused, feel like you might faint, or faint. Your vision is blurry or you have a severe headache. You have severe pain in your abdomen, your back, or the area between your waist and hips (pelvis). You have chest pain, shortness of breath, or an irregular or fast heartbeat. You are unable to urinate, or you urinate less than normal. You have abnormal bleeding from the rectum, nose, lungs, nipples, or, if you are male, the vagina. You vomit blood. You have thoughts about hurting yourself or others. These symptoms may be an emergency. Get help right away. Call 911. Do not wait to see if the symptoms will go away. Do not drive yourself to the hospital. Get help right away if you feel like you may hurt yourself or others, or have thoughts about taking your own life. Go to your nearest emergency room or: Call 911. Call the National Suicide Prevention Lifeline at 212-256-9393 or 988. This is open 24 hours a day. Text the Crisis Text Line at (520)425-9771. Summary If you have fatigue, you feel tired all the time and have a lack of energy or a lack of motivation. Fatigue may make it difficult to start or complete  tasks because of exhaustion. Long-term (chronic) or extreme fatigue may be a symptom of a medical condition. Exercise regularly, as told by your health care provider. Change situations that cause you stress. Try to keep your work and personal schedules in balance. This information is not intended to replace advice given to you by your health care provider. Make sure you discuss any questions you have with your health care provider. Document Revised: 07/26/2021 Document Reviewed: 07/26/2021 Elsevier Patient Education  2024 [employer]  The above assessment and management plan was discussed with the patient. The  patient verbalized understanding of and has agreed to the management plan. Patient is aware to call the clinic if they develop any new symptoms or if symptoms persist or worsen. Patient is aware when to return to the clinic for a follow-up visit. Patient educated on when it is appropriate to go to the emergency department.   Bryan Fatheree [place] Thompson, DNP [facility] [address] [place], [place] [address] [phone]       [1] No Known Allergies  "

## 2024-10-30 LAB — B12 AND FOLATE PANEL
Folate: >20 ng/mL
Vitamin B-12: 666 pg/mL (ref 232–1245)

## 2024-10-30 LAB — MICROALBUMIN / CREATININE URINE RATIO
Creatinine, Urine: 27.4 mg/dL
Microalb/Creat Ratio: 355 mg/g{creat} — ABNORMAL HIGH (ref 0–29)
Microalbumin, Urine: 97.3 ug/mL

## 2024-10-31 ENCOUNTER — Ambulatory Visit: Payer: Self-pay | Admitting: Nurse Practitioner

## 2024-11-07 ENCOUNTER — Other Ambulatory Visit: Payer: Self-pay | Admitting: Nurse Practitioner

## 2024-11-07 DIAGNOSIS — M1A2790 Drug-induced chronic gout, unspecified ankle and foot, without tophus (tophi): Secondary | ICD-10-CM

## 2024-11-22 ENCOUNTER — Other Ambulatory Visit: Payer: Self-pay | Admitting: Family Medicine

## 2024-11-22 ENCOUNTER — Other Ambulatory Visit: Payer: Self-pay | Admitting: Internal Medicine

## 2024-11-22 DIAGNOSIS — D649 Anemia, unspecified: Secondary | ICD-10-CM

## 2024-12-06 ENCOUNTER — Inpatient Hospital Stay

## 2024-12-13 ENCOUNTER — Inpatient Hospital Stay: Admitting: Oncology

## 2024-12-13 ENCOUNTER — Ambulatory Visit: Admitting: Oncology

## 2025-02-27 ENCOUNTER — Encounter: Admitting: Family Medicine
